# Patient Record
Sex: Male | Born: 1950 | Race: White | Hispanic: No | Marital: Single | Smoking: Former smoker
Health system: Southern US, Community
[De-identification: ages and names within clinical notes are randomized; demographics above are authoritative.]

## PROBLEM LIST (undated history)

## (undated) DIAGNOSIS — G5600 Carpal tunnel syndrome, unspecified upper limb: Secondary | ICD-10-CM

## (undated) DIAGNOSIS — N319 Neuromuscular dysfunction of bladder, unspecified: Secondary | ICD-10-CM

## (undated) DIAGNOSIS — E785 Hyperlipidemia, unspecified: Secondary | ICD-10-CM

## (undated) DIAGNOSIS — E039 Hypothyroidism, unspecified: Secondary | ICD-10-CM

## (undated) DIAGNOSIS — Z8719 Personal history of other diseases of the digestive system: Secondary | ICD-10-CM

## (undated) DIAGNOSIS — K219 Gastro-esophageal reflux disease without esophagitis: Secondary | ICD-10-CM

## (undated) DIAGNOSIS — I739 Peripheral vascular disease, unspecified: Secondary | ICD-10-CM

## (undated) DIAGNOSIS — M419 Scoliosis, unspecified: Secondary | ICD-10-CM

## (undated) DIAGNOSIS — R0600 Dyspnea, unspecified: Secondary | ICD-10-CM

## (undated) DIAGNOSIS — IMO0002 Reserved for concepts with insufficient information to code with codable children: Secondary | ICD-10-CM

## (undated) DIAGNOSIS — Z87448 Personal history of other diseases of urinary system: Secondary | ICD-10-CM

## (undated) DIAGNOSIS — M797 Fibromyalgia: Secondary | ICD-10-CM

## (undated) DIAGNOSIS — M159 Polyosteoarthritis, unspecified: Secondary | ICD-10-CM

## (undated) DIAGNOSIS — M75100 Unspecified rotator cuff tear or rupture of unspecified shoulder, not specified as traumatic: Secondary | ICD-10-CM

## (undated) DIAGNOSIS — Z87898 Personal history of other specified conditions: Secondary | ICD-10-CM

## (undated) DIAGNOSIS — F329 Major depressive disorder, single episode, unspecified: Secondary | ICD-10-CM

## (undated) DIAGNOSIS — I38 Endocarditis, valve unspecified: Secondary | ICD-10-CM

## (undated) DIAGNOSIS — N400 Enlarged prostate without lower urinary tract symptoms: Secondary | ICD-10-CM

## (undated) DIAGNOSIS — I2510 Atherosclerotic heart disease of native coronary artery without angina pectoris: Secondary | ICD-10-CM

## (undated) DIAGNOSIS — I2699 Other pulmonary embolism without acute cor pulmonale: Secondary | ICD-10-CM

## (undated) DIAGNOSIS — I219 Acute myocardial infarction, unspecified: Secondary | ICD-10-CM

## (undated) DIAGNOSIS — F419 Anxiety disorder, unspecified: Secondary | ICD-10-CM

## (undated) DIAGNOSIS — M726 Necrotizing fasciitis: Secondary | ICD-10-CM

## (undated) DIAGNOSIS — G4700 Insomnia, unspecified: Secondary | ICD-10-CM

## (undated) DIAGNOSIS — I10 Essential (primary) hypertension: Secondary | ICD-10-CM

## (undated) DIAGNOSIS — G35 Multiple sclerosis: Secondary | ICD-10-CM

## (undated) DIAGNOSIS — D696 Thrombocytopenia, unspecified: Secondary | ICD-10-CM

## (undated) DIAGNOSIS — R0609 Other forms of dyspnea: Secondary | ICD-10-CM

## (undated) DIAGNOSIS — G4733 Obstructive sleep apnea (adult) (pediatric): Secondary | ICD-10-CM

## (undated) DIAGNOSIS — Z8614 Personal history of Methicillin resistant Staphylococcus aureus infection: Secondary | ICD-10-CM

## (undated) DIAGNOSIS — F909 Attention-deficit hyperactivity disorder, unspecified type: Secondary | ICD-10-CM

## (undated) DIAGNOSIS — Z8739 Personal history of other diseases of the musculoskeletal system and connective tissue: Secondary | ICD-10-CM

## (undated) DIAGNOSIS — N1330 Unspecified hydronephrosis: Secondary | ICD-10-CM

## (undated) DIAGNOSIS — J449 Chronic obstructive pulmonary disease, unspecified: Secondary | ICD-10-CM

## (undated) DIAGNOSIS — Z9289 Personal history of other medical treatment: Secondary | ICD-10-CM

## (undated) DIAGNOSIS — F32A Depression, unspecified: Secondary | ICD-10-CM

## (undated) DIAGNOSIS — R011 Cardiac murmur, unspecified: Secondary | ICD-10-CM

## (undated) HISTORY — DX: Depression, unspecified: F32.A

## (undated) HISTORY — PX: CARPAL TUNNEL RELEASE: SHX101

## (undated) HISTORY — DX: Insomnia, unspecified: G47.00

## (undated) HISTORY — DX: Neuromuscular dysfunction of bladder, unspecified: N31.9

## (undated) HISTORY — DX: Necrotizing fasciitis: M72.6

## (undated) HISTORY — DX: Major depressive disorder, single episode, unspecified: F32.9

## (undated) HISTORY — DX: Essential (primary) hypertension: I10

## (undated) HISTORY — DX: Benign prostatic hyperplasia without lower urinary tract symptoms: N40.0

## (undated) HISTORY — DX: Personal history of other diseases of the musculoskeletal system and connective tissue: Z87.39

## (undated) HISTORY — DX: Personal history of other diseases of urinary system: Z87.448

## (undated) HISTORY — DX: Obstructive sleep apnea (adult) (pediatric): G47.33

## (undated) HISTORY — DX: Reserved for concepts with insufficient information to code with codable children: IMO0002

## (undated) HISTORY — DX: Hyperlipidemia, unspecified: E78.5

## (undated) HISTORY — DX: Thrombocytopenia, unspecified: D69.6

## (undated) HISTORY — DX: Endocarditis, valve unspecified: I38

## (undated) HISTORY — DX: Unspecified rotator cuff tear or rupture of unspecified shoulder, not specified as traumatic: M75.100

## (undated) HISTORY — PX: COLONOSCOPY: SHX174

## (undated) HISTORY — DX: Atherosclerotic heart disease of native coronary artery without angina pectoris: I25.10

## (undated) HISTORY — DX: Personal history of other specified conditions: Z87.898

## (undated) HISTORY — PX: OTHER SURGICAL HISTORY: SHX169

## (undated) HISTORY — DX: Personal history of Methicillin resistant Staphylococcus aureus infection: Z86.14

## (undated) HISTORY — DX: Polyosteoarthritis, unspecified: M15.9

## (undated) HISTORY — DX: Anxiety disorder, unspecified: F41.9

## (undated) HISTORY — DX: Chronic obstructive pulmonary disease, unspecified: J44.9

## (undated) HISTORY — DX: Carpal tunnel syndrome, unspecified upper limb: G56.00

## (undated) HISTORY — DX: Hypothyroidism, unspecified: E03.9

## (undated) HISTORY — DX: Other pulmonary embolism without acute cor pulmonale: I26.99

## (undated) HISTORY — DX: Gastro-esophageal reflux disease without esophagitis: K21.9

---

## 1988-12-10 HISTORY — PX: KNEE ARTHROSCOPY W/ MENISCAL REPAIR: SHX1877

## 2004-12-22 ENCOUNTER — Emergency Department (HOSPITAL_COMMUNITY): Admission: EM | Admit: 2004-12-22 | Discharge: 2004-12-22 | Payer: Self-pay | Admitting: Emergency Medicine

## 2005-05-23 ENCOUNTER — Ambulatory Visit (HOSPITAL_COMMUNITY): Admission: RE | Admit: 2005-05-23 | Discharge: 2005-05-24 | Payer: Self-pay | Admitting: Orthopedic Surgery

## 2005-09-29 ENCOUNTER — Emergency Department (HOSPITAL_COMMUNITY): Admission: EM | Admit: 2005-09-29 | Discharge: 2005-09-29 | Payer: Self-pay | Admitting: Emergency Medicine

## 2005-10-19 ENCOUNTER — Emergency Department (HOSPITAL_COMMUNITY): Admission: EM | Admit: 2005-10-19 | Discharge: 2005-10-19 | Payer: Self-pay | Admitting: Emergency Medicine

## 2005-10-22 ENCOUNTER — Inpatient Hospital Stay (HOSPITAL_COMMUNITY): Admission: EM | Admit: 2005-10-22 | Discharge: 2005-10-26 | Payer: Self-pay | Admitting: Emergency Medicine

## 2005-10-22 ENCOUNTER — Ambulatory Visit: Payer: Self-pay | Admitting: Internal Medicine

## 2005-10-26 ENCOUNTER — Inpatient Hospital Stay (HOSPITAL_COMMUNITY): Admission: RE | Admit: 2005-10-26 | Discharge: 2005-10-31 | Payer: Self-pay | Admitting: Psychiatry

## 2005-10-27 ENCOUNTER — Ambulatory Visit: Payer: Self-pay | Admitting: Psychiatry

## 2006-05-26 ENCOUNTER — Emergency Department (HOSPITAL_COMMUNITY): Admission: EM | Admit: 2006-05-26 | Discharge: 2006-05-26 | Payer: Self-pay | Admitting: Emergency Medicine

## 2006-05-28 ENCOUNTER — Emergency Department (HOSPITAL_COMMUNITY): Admission: EM | Admit: 2006-05-28 | Discharge: 2006-05-28 | Payer: Self-pay | Admitting: Emergency Medicine

## 2006-08-13 ENCOUNTER — Emergency Department (HOSPITAL_COMMUNITY): Admission: EM | Admit: 2006-08-13 | Discharge: 2006-08-13 | Payer: Self-pay | Admitting: Emergency Medicine

## 2006-12-03 ENCOUNTER — Emergency Department (HOSPITAL_COMMUNITY): Admission: EM | Admit: 2006-12-03 | Discharge: 2006-12-03 | Payer: Self-pay | Admitting: Emergency Medicine

## 2007-01-28 ENCOUNTER — Emergency Department (HOSPITAL_COMMUNITY): Admission: EM | Admit: 2007-01-28 | Discharge: 2007-01-28 | Payer: Self-pay | Admitting: Emergency Medicine

## 2007-01-31 ENCOUNTER — Ambulatory Visit (HOSPITAL_COMMUNITY): Admission: RE | Admit: 2007-01-31 | Discharge: 2007-01-31 | Payer: Self-pay | Admitting: Orthopedic Surgery

## 2007-09-03 ENCOUNTER — Emergency Department (HOSPITAL_COMMUNITY): Admission: EM | Admit: 2007-09-03 | Discharge: 2007-09-03 | Payer: Self-pay | Admitting: Emergency Medicine

## 2008-09-02 ENCOUNTER — Emergency Department (HOSPITAL_COMMUNITY): Admission: EM | Admit: 2008-09-02 | Discharge: 2008-09-02 | Payer: Self-pay | Admitting: Emergency Medicine

## 2009-04-11 DIAGNOSIS — Z87448 Personal history of other diseases of urinary system: Secondary | ICD-10-CM

## 2009-04-11 HISTORY — PX: SKIN GRAFT: SHX250

## 2009-04-11 HISTORY — DX: Personal history of other diseases of urinary system: Z87.448

## 2009-05-01 ENCOUNTER — Ambulatory Visit: Payer: Self-pay | Admitting: Infectious Disease

## 2009-05-05 ENCOUNTER — Emergency Department (HOSPITAL_COMMUNITY)
Admission: EM | Admit: 2009-05-05 | Discharge: 2009-05-05 | Payer: Self-pay | Source: Home / Self Care | Admitting: Emergency Medicine

## 2009-05-05 ENCOUNTER — Encounter (INDEPENDENT_AMBULATORY_CARE_PROVIDER_SITE_OTHER): Payer: Self-pay | Admitting: Internal Medicine

## 2009-08-20 ENCOUNTER — Ambulatory Visit: Payer: Self-pay | Admitting: Internal Medicine

## 2009-08-20 ENCOUNTER — Inpatient Hospital Stay (HOSPITAL_COMMUNITY): Admission: EM | Admit: 2009-08-20 | Discharge: 2009-09-11 | Payer: Self-pay | Admitting: Emergency Medicine

## 2009-08-20 ENCOUNTER — Ambulatory Visit: Payer: Self-pay | Admitting: Cardiovascular Disease

## 2009-08-21 ENCOUNTER — Encounter (INDEPENDENT_AMBULATORY_CARE_PROVIDER_SITE_OTHER): Payer: Self-pay | Admitting: Internal Medicine

## 2009-08-28 ENCOUNTER — Encounter (INDEPENDENT_AMBULATORY_CARE_PROVIDER_SITE_OTHER): Payer: Self-pay | Admitting: Nephrology

## 2009-10-07 ENCOUNTER — Ambulatory Visit: Payer: Self-pay | Admitting: Vascular Surgery

## 2009-12-03 ENCOUNTER — Emergency Department (HOSPITAL_COMMUNITY): Admission: EM | Admit: 2009-12-03 | Discharge: 2009-12-03 | Payer: Self-pay | Admitting: Emergency Medicine

## 2009-12-15 ENCOUNTER — Encounter: Admission: RE | Admit: 2009-12-15 | Discharge: 2010-01-06 | Payer: Self-pay | Admitting: Nephrology

## 2010-04-11 DIAGNOSIS — I38 Endocarditis, valve unspecified: Secondary | ICD-10-CM

## 2010-04-11 DIAGNOSIS — Z8614 Personal history of Methicillin resistant Staphylococcus aureus infection: Secondary | ICD-10-CM

## 2010-04-11 DIAGNOSIS — M75100 Unspecified rotator cuff tear or rupture of unspecified shoulder, not specified as traumatic: Secondary | ICD-10-CM

## 2010-04-11 HISTORY — DX: Personal history of Methicillin resistant Staphylococcus aureus infection: Z86.14

## 2010-04-11 HISTORY — DX: Endocarditis, valve unspecified: I38

## 2010-04-11 HISTORY — DX: Unspecified rotator cuff tear or rupture of unspecified shoulder, not specified as traumatic: M75.100

## 2010-05-02 ENCOUNTER — Encounter: Payer: Self-pay | Admitting: Orthopedic Surgery

## 2010-05-02 ENCOUNTER — Encounter (HOSPITAL_BASED_OUTPATIENT_CLINIC_OR_DEPARTMENT_OTHER): Payer: Self-pay | Admitting: Internal Medicine

## 2010-06-25 LAB — POCT I-STAT, CHEM 8
BUN: 29 mg/dL — ABNORMAL HIGH (ref 6–23)
Chloride: 103 mEq/L (ref 96–112)
HCT: 42 % (ref 39.0–52.0)
Potassium: 3.1 mEq/L — ABNORMAL LOW (ref 3.5–5.1)
TCO2: 24 mmol/L (ref 0–100)

## 2010-06-25 LAB — PROTIME-INR: INR: 1.03 (ref 0.00–1.49)

## 2010-06-25 LAB — CBC
MCH: 32.8 pg (ref 26.0–34.0)
MCHC: 33 g/dL (ref 30.0–36.0)
MCV: 99.4 fL (ref 78.0–100.0)
Platelets: 454 10*3/uL — ABNORMAL HIGH (ref 150–400)
RDW: 17.6 % — ABNORMAL HIGH (ref 11.5–15.5)
WBC: 10.4 10*3/uL (ref 4.0–10.5)

## 2010-06-25 LAB — DIFFERENTIAL
Basophils Relative: 0 % (ref 0–1)
Lymphocytes Relative: 15 % (ref 12–46)
Lymphs Abs: 1.5 10*3/uL (ref 0.7–4.0)
Monocytes Relative: 6 % (ref 3–12)

## 2010-06-27 LAB — POCT I-STAT 3, ART BLOOD GAS (G3+)
Acid-Base Excess: 5 mmol/L — ABNORMAL HIGH (ref 0.0–2.0)
Bicarbonate: 28.8 mEq/L — ABNORMAL HIGH (ref 20.0–24.0)
Patient temperature: 98.3
pH, Arterial: 7.493 — ABNORMAL HIGH (ref 7.350–7.450)

## 2010-06-27 LAB — SODIUM, URINE, RANDOM: Sodium, Ur: <10 mEq/L

## 2010-06-27 LAB — URINALYSIS, ROUTINE W REFLEX MICROSCOPIC
Nitrite: NEGATIVE
Specific Gravity, Urine: >1.046 — ABNORMAL HIGH (ref 1.005–1.030)
Urobilinogen, UA: 4 mg/dL — ABNORMAL HIGH (ref 0.0–1.0)
pH: 6.5 (ref 5.0–8.0)

## 2010-06-27 LAB — CULTURE, BLOOD (ROUTINE X 2)

## 2010-06-27 LAB — HEPATITIS PANEL, ACUTE
HCV Ab: NEGATIVE
Hep B C IgM: POSITIVE — AB
Hepatitis B Surface Ag: NEGATIVE

## 2010-06-27 LAB — AMYLASE: Amylase: 18 U/L (ref 0–105)

## 2010-06-27 LAB — DIFFERENTIAL
Eosinophils Absolute: 0 10*3/uL (ref 0.0–0.7)
Lymphs Abs: 1 10*3/uL (ref 0.7–4.0)
Monocytes Absolute: 0.2 10*3/uL (ref 0.1–1.0)
Monocytes Relative: 1 % — ABNORMAL LOW (ref 3–12)
Neutro Abs: 23.4 10*3/uL — ABNORMAL HIGH (ref 1.7–7.7)

## 2010-06-27 LAB — COMPREHENSIVE METABOLIC PANEL
AST: 86 U/L — ABNORMAL HIGH (ref 0–37)
BUN: 19 mg/dL (ref 6–23)
Calcium: 8 mg/dL — ABNORMAL LOW (ref 8.4–10.5)
Chloride: 86 mEq/L — ABNORMAL LOW (ref 96–112)
Glucose, Bld: 284 mg/dL — ABNORMAL HIGH (ref 70–99)
Total Protein: 5.7 g/dL — ABNORMAL LOW (ref 6.0–8.3)

## 2010-06-27 LAB — GRAM STAIN

## 2010-06-27 LAB — URINE CULTURE

## 2010-06-27 LAB — CULTURE, ROUTINE-ABSCESS

## 2010-06-27 LAB — CBC
RBC: 3.73 MIL/uL — ABNORMAL LOW (ref 4.22–5.81)
RDW: 14.4 % (ref 11.5–15.5)

## 2010-06-27 LAB — LACTIC ACID, PLASMA: Lactic Acid, Venous: 4.5 mmol/L — ABNORMAL HIGH (ref 0.5–2.2)

## 2010-06-27 LAB — POCT CARDIAC MARKERS: Troponin i, poc: <0.05 ng/mL (ref 0.00–0.09)

## 2010-06-27 LAB — AFB CULTURE WITH SMEAR (NOT AT ARMC)

## 2010-06-27 LAB — D-DIMER, QUANTITATIVE: D-Dimer, Quant: 10 ug/mL-FEU — ABNORMAL HIGH (ref 0.00–0.48)

## 2010-06-27 LAB — FUNGUS CULTURE W SMEAR: Fungal Smear: NONE SEEN

## 2010-06-27 LAB — LIPASE, BLOOD: Lipase: 17 U/L (ref 11–59)

## 2010-06-27 LAB — URINE MICROSCOPIC-ADD ON

## 2010-06-27 LAB — RAPID URINE DRUG SCREEN, HOSP PERFORMED: Tetrahydrocannabinol: POSITIVE — AB

## 2010-06-28 LAB — RENAL FUNCTION PANEL
Albumin: 2.2 g/dL — ABNORMAL LOW (ref 3.5–5.2)
Albumin: 2.2 g/dL — ABNORMAL LOW (ref 3.5–5.2)
Albumin: 2.1 g/dL — ABNORMAL LOW (ref 3.5–5.2)
Albumin: 2.2 g/dL — ABNORMAL LOW (ref 3.5–5.2)
Albumin: 2.2 g/dL — ABNORMAL LOW (ref 3.5–5.2)
Albumin: 2.5 g/dL — ABNORMAL LOW (ref 3.5–5.2)
Albumin: 2.5 g/dL — ABNORMAL LOW (ref 3.5–5.2)
Albumin: 2.6 g/dL — ABNORMAL LOW (ref 3.5–5.2)
Albumin: 2.6 g/dL — ABNORMAL LOW (ref 3.5–5.2)
BUN: 63 mg/dL — ABNORMAL HIGH (ref 6–23)
BUN: 104 mg/dL — ABNORMAL HIGH (ref 6–23)
BUN: 106 mg/dL — ABNORMAL HIGH (ref 6–23)
BUN: 107 mg/dL — ABNORMAL HIGH (ref 6–23)
BUN: 63 mg/dL — ABNORMAL HIGH (ref 6–23)
BUN: 67 mg/dL — ABNORMAL HIGH (ref 6–23)
BUN: 74 mg/dL — ABNORMAL HIGH (ref 6–23)
BUN: 90 mg/dL — ABNORMAL HIGH (ref 6–23)
CO2: 25 mEq/L (ref 19–32)
CO2: 19 mEq/L (ref 19–32)
CO2: 21 mEq/L (ref 19–32)
CO2: 22 mEq/L (ref 19–32)
CO2: 22 mEq/L (ref 19–32)
CO2: 23 mEq/L (ref 19–32)
CO2: 23 mEq/L (ref 19–32)
CO2: 23 mEq/L (ref 19–32)
CO2: 23 mEq/L (ref 19–32)
CO2: 24 mEq/L (ref 19–32)
CO2: 25 mEq/L (ref 19–32)
Calcium: 8.6 mg/dL (ref 8.4–10.5)
Calcium: 8.7 mg/dL (ref 8.4–10.5)
Calcium: 8.6 mg/dL (ref 8.4–10.5)
Calcium: 8.6 mg/dL (ref 8.4–10.5)
Calcium: 8.7 mg/dL (ref 8.4–10.5)
Calcium: 8.7 mg/dL (ref 8.4–10.5)
Calcium: 8.8 mg/dL (ref 8.4–10.5)
Calcium: 8.8 mg/dL (ref 8.4–10.5)
Calcium: 8.8 mg/dL (ref 8.4–10.5)
Calcium: 8.8 mg/dL (ref 8.4–10.5)
Chloride: 96 mEq/L (ref 96–112)
Chloride: 100 mEq/L (ref 96–112)
Chloride: 105 mEq/L (ref 96–112)
Chloride: 106 mEq/L (ref 96–112)
Chloride: 107 mEq/L (ref 96–112)
Chloride: 97 mEq/L (ref 96–112)
Chloride: 98 mEq/L (ref 96–112)
Chloride: 98 mEq/L (ref 96–112)
Creatinine, Ser: 4.69 mg/dL — ABNORMAL HIGH (ref 0.4–1.5)
Creatinine, Ser: 5.46 mg/dL — ABNORMAL HIGH (ref 0.4–1.5)
Creatinine, Ser: 6.51 mg/dL — ABNORMAL HIGH (ref 0.4–1.5)
Creatinine, Ser: 7.17 mg/dL — ABNORMAL HIGH (ref 0.4–1.5)
GFR calc Af Amer: 12 mL/min — ABNORMAL LOW (ref >60)
GFR calc Af Amer: 10 mL/min — ABNORMAL LOW (ref >60)
GFR calc Af Amer: 10 mL/min — ABNORMAL LOW (ref >60)
GFR calc Af Amer: 11 mL/min — ABNORMAL LOW (ref >60)
GFR calc Af Amer: 12 mL/min — ABNORMAL LOW (ref >60)
GFR calc Af Amer: 13 mL/min — ABNORMAL LOW (ref >60)
GFR calc Af Amer: 13 mL/min — ABNORMAL LOW (ref >60)
GFR calc Af Amer: 13 mL/min — ABNORMAL LOW (ref >60)
GFR calc Af Amer: 15 mL/min — ABNORMAL LOW (ref >60)
GFR calc Af Amer: 16 mL/min — ABNORMAL LOW (ref >60)
GFR calc Af Amer: 20 mL/min — ABNORMAL LOW (ref >60)
GFR calc non Af Amer: 10 mL/min — ABNORMAL LOW (ref >60)
GFR calc non Af Amer: 10 mL/min — ABNORMAL LOW (ref >60)
GFR calc non Af Amer: 10 mL/min — ABNORMAL LOW (ref >60)
GFR calc non Af Amer: 11 mL/min — ABNORMAL LOW (ref >60)
GFR calc non Af Amer: 11 mL/min — ABNORMAL LOW (ref >60)
GFR calc non Af Amer: 12 mL/min — ABNORMAL LOW (ref >60)
GFR calc non Af Amer: 13 mL/min — ABNORMAL LOW (ref >60)
GFR calc non Af Amer: 16 mL/min — ABNORMAL LOW (ref >60)
GFR calc non Af Amer: 8 mL/min — ABNORMAL LOW (ref >60)
GFR calc non Af Amer: 8 mL/min — ABNORMAL LOW (ref >60)
GFR calc non Af Amer: 9 mL/min — ABNORMAL LOW (ref >60)
Glucose, Bld: 94 mg/dL (ref 70–99)
Glucose, Bld: 112 mg/dL — ABNORMAL HIGH (ref 70–99)
Glucose, Bld: 115 mg/dL — ABNORMAL HIGH (ref 70–99)
Glucose, Bld: 148 mg/dL — ABNORMAL HIGH (ref 70–99)
Glucose, Bld: 150 mg/dL — ABNORMAL HIGH (ref 70–99)
Glucose, Bld: 152 mg/dL — ABNORMAL HIGH (ref 70–99)
Glucose, Bld: 90 mg/dL (ref 70–99)
Glucose, Bld: 94 mg/dL (ref 70–99)
Glucose, Bld: 99 mg/dL (ref 70–99)
Glucose, Bld: 99 mg/dL (ref 70–99)
Phosphorus: 3.5 mg/dL (ref 2.3–4.6)
Phosphorus: 4.8 mg/dL — ABNORMAL HIGH (ref 2.3–4.6)
Phosphorus: 4 mg/dL (ref 2.3–4.6)
Phosphorus: 5.7 mg/dL — ABNORMAL HIGH (ref 2.3–4.6)
Phosphorus: 6 mg/dL — ABNORMAL HIGH (ref 2.3–4.6)
Phosphorus: 6.2 mg/dL — ABNORMAL HIGH (ref 2.3–4.6)
Phosphorus: 6.4 mg/dL — ABNORMAL HIGH (ref 2.3–4.6)
Phosphorus: 6.8 mg/dL — ABNORMAL HIGH (ref 2.3–4.6)
Phosphorus: 7.3 mg/dL — ABNORMAL HIGH (ref 2.3–4.6)
Phosphorus: 8.5 mg/dL — ABNORMAL HIGH (ref 2.3–4.6)
Potassium: 4.2 mEq/L (ref 3.5–5.1)
Potassium: 3.8 mEq/L (ref 3.5–5.1)
Potassium: 3.9 mEq/L (ref 3.5–5.1)
Potassium: 4.2 mEq/L (ref 3.5–5.1)
Potassium: 4.4 mEq/L (ref 3.5–5.1)
Potassium: 4.4 mEq/L (ref 3.5–5.1)
Potassium: 4.4 mEq/L (ref 3.5–5.1)
Potassium: 4.5 mEq/L (ref 3.5–5.1)
Potassium: 4.5 mEq/L (ref 3.5–5.1)
Potassium: 4.9 mEq/L (ref 3.5–5.1)
Potassium: 5.3 mEq/L — ABNORMAL HIGH (ref 3.5–5.1)
Sodium: 134 mEq/L — ABNORMAL LOW (ref 135–145)
Sodium: 135 mEq/L (ref 135–145)
Sodium: 135 mEq/L (ref 135–145)
Sodium: 133 mEq/L — ABNORMAL LOW (ref 135–145)
Sodium: 135 mEq/L (ref 135–145)
Sodium: 136 mEq/L (ref 135–145)
Sodium: 136 mEq/L (ref 135–145)
Sodium: 136 mEq/L (ref 135–145)
Sodium: 137 mEq/L (ref 135–145)
Sodium: 137 mEq/L (ref 135–145)
Sodium: 138 mEq/L (ref 135–145)
Sodium: 140 mEq/L (ref 135–145)
Sodium: 140 mEq/L (ref 135–145)
Sodium: 141 mEq/L (ref 135–145)
Sodium: 141 mEq/L (ref 135–145)
Sodium: 142 mEq/L (ref 135–145)

## 2010-06-28 LAB — DIFFERENTIAL
Basophils Absolute: 0 10*3/uL (ref 0.0–0.1)
Basophils Absolute: 0 10*3/uL (ref 0.0–0.1)
Basophils Absolute: 0.1 10*3/uL (ref 0.0–0.1)
Basophils Absolute: 0.1 10*3/uL (ref 0.0–0.1)
Basophils Absolute: 0.1 10*3/uL (ref 0.0–0.1)
Basophils Relative: 0 % (ref 0–1)
Basophils Relative: 0 % (ref 0–1)
Basophils Relative: 0 % (ref 0–1)
Basophils Relative: 0 % (ref 0–1)
Basophils Relative: 0 % (ref 0–1)
Basophils Relative: 0 % (ref 0–1)
Basophils Relative: 1 % (ref 0–1)
Eosinophils Absolute: 0 10*3/uL (ref 0.0–0.7)
Eosinophils Absolute: 0 10*3/uL (ref 0.0–0.7)
Eosinophils Absolute: 0.1 10*3/uL (ref 0.0–0.7)
Eosinophils Absolute: 0.1 10*3/uL (ref 0.0–0.7)
Eosinophils Absolute: 0.1 10*3/uL (ref 0.0–0.7)
Eosinophils Absolute: 0.1 10*3/uL (ref 0.0–0.7)
Eosinophils Absolute: 0.1 10*3/uL (ref 0.0–0.7)
Eosinophils Absolute: 0.3 10*3/uL (ref 0.0–0.7)
Eosinophils Relative: 0 % (ref 0–5)
Eosinophils Relative: 0 % (ref 0–5)
Eosinophils Relative: 1 % (ref 0–5)
Eosinophils Relative: 1 % (ref 0–5)
Eosinophils Relative: 1 % (ref 0–5)
Eosinophils Relative: 1 % (ref 0–5)
Lymphocytes Relative: 20 % (ref 12–46)
Lymphocytes Relative: 10 % — ABNORMAL LOW (ref 12–46)
Lymphocytes Relative: 14 % (ref 12–46)
Lymphocytes Relative: 16 % (ref 12–46)
Lymphocytes Relative: 16 % (ref 12–46)
Lymphocytes Relative: 17 % (ref 12–46)
Lymphocytes Relative: 18 % (ref 12–46)
Lymphocytes Relative: 22 % (ref 12–46)
Lymphocytes Relative: 26 % (ref 12–46)
Lymphs Abs: 3 10*3/uL (ref 0.7–4.0)
Lymphs Abs: 1.4 10*3/uL (ref 0.7–4.0)
Lymphs Abs: 1.9 10*3/uL (ref 0.7–4.0)
Lymphs Abs: 2.1 10*3/uL (ref 0.7–4.0)
Lymphs Abs: 2.6 10*3/uL (ref 0.7–4.0)
Lymphs Abs: 2.7 10*3/uL (ref 0.7–4.0)
Lymphs Abs: 2.7 10*3/uL (ref 0.7–4.0)
Lymphs Abs: 2.8 10*3/uL (ref 0.7–4.0)
Lymphs Abs: 3 10*3/uL (ref 0.7–4.0)
Lymphs Abs: 3.1 10*3/uL (ref 0.7–4.0)
Monocytes Absolute: 0.6 10*3/uL (ref 0.1–1.0)
Monocytes Absolute: 1.1 10*3/uL — ABNORMAL HIGH (ref 0.1–1.0)
Monocytes Absolute: 1.2 10*3/uL — ABNORMAL HIGH (ref 0.1–1.0)
Monocytes Absolute: 1.5 10*3/uL — ABNORMAL HIGH (ref 0.1–1.0)
Monocytes Absolute: 1.5 10*3/uL — ABNORMAL HIGH (ref 0.1–1.0)
Monocytes Absolute: 1.6 10*3/uL — ABNORMAL HIGH (ref 0.1–1.0)
Monocytes Absolute: 1.7 10*3/uL — ABNORMAL HIGH (ref 0.1–1.0)
Monocytes Absolute: 1.7 10*3/uL — ABNORMAL HIGH (ref 0.1–1.0)
Monocytes Relative: 10 % (ref 3–12)
Monocytes Relative: 10 % (ref 3–12)
Monocytes Relative: 10 % (ref 3–12)
Monocytes Relative: 11 % (ref 3–12)
Monocytes Relative: 11 % (ref 3–12)
Monocytes Relative: 13 % — ABNORMAL HIGH (ref 3–12)
Monocytes Relative: 7 % (ref 3–12)
Monocytes Relative: 7 % (ref 3–12)
Monocytes Relative: 7 % (ref 3–12)
Neutro Abs: 10.4 10*3/uL — ABNORMAL HIGH (ref 1.7–7.7)
Neutro Abs: 11.3 10*3/uL — ABNORMAL HIGH (ref 1.7–7.7)
Neutro Abs: 12.9 10*3/uL — ABNORMAL HIGH (ref 1.7–7.7)
Neutro Abs: 14 10*3/uL — ABNORMAL HIGH (ref 1.7–7.7)
Neutro Abs: 14.1 10*3/uL — ABNORMAL HIGH (ref 1.7–7.7)
Neutro Abs: 14.6 10*3/uL — ABNORMAL HIGH (ref 1.7–7.7)
Neutro Abs: 6.5 10*3/uL (ref 1.7–7.7)
Neutrophils Relative %: 67 % (ref 43–77)
Neutrophils Relative %: 62 % (ref 43–77)
Neutrophils Relative %: 65 % (ref 43–77)
Neutrophils Relative %: 66 % (ref 43–77)
Neutrophils Relative %: 76 % (ref 43–77)
Neutrophils Relative %: 77 % (ref 43–77)
Neutrophils Relative %: 80 % — ABNORMAL HIGH (ref 43–77)
Neutrophils Relative %: 81 % — ABNORMAL HIGH (ref 43–77)

## 2010-06-28 LAB — CBC
HCT: 25.8 % — ABNORMAL LOW (ref 39.0–52.0)
HCT: 22.1 % — ABNORMAL LOW (ref 39.0–52.0)
HCT: 24 % — ABNORMAL LOW (ref 39.0–52.0)
HCT: 24.1 % — ABNORMAL LOW (ref 39.0–52.0)
HCT: 24.3 % — ABNORMAL LOW (ref 39.0–52.0)
HCT: 24.9 % — ABNORMAL LOW (ref 39.0–52.0)
HCT: 25.4 % — ABNORMAL LOW (ref 39.0–52.0)
HCT: 26.2 % — ABNORMAL LOW (ref 39.0–52.0)
HCT: 28.7 % — ABNORMAL LOW (ref 39.0–52.0)
Hemoglobin: 7.6 g/dL — ABNORMAL LOW (ref 13.0–17.0)
Hemoglobin: 8.6 g/dL — ABNORMAL LOW (ref 13.0–17.0)
Hemoglobin: 6.7 g/dL — CL (ref 13.0–17.0)
Hemoglobin: 7.5 g/dL — ABNORMAL LOW (ref 13.0–17.0)
Hemoglobin: 7.9 g/dL — ABNORMAL LOW (ref 13.0–17.0)
Hemoglobin: 8 g/dL — ABNORMAL LOW (ref 13.0–17.0)
Hemoglobin: 8.2 g/dL — ABNORMAL LOW (ref 13.0–17.0)
Hemoglobin: 8.3 g/dL — ABNORMAL LOW (ref 13.0–17.0)
Hemoglobin: 8.4 g/dL — ABNORMAL LOW (ref 13.0–17.0)
Hemoglobin: 8.6 g/dL — ABNORMAL LOW (ref 13.0–17.0)
Hemoglobin: 8.6 g/dL — ABNORMAL LOW (ref 13.0–17.0)
Hemoglobin: 8.7 g/dL — ABNORMAL LOW (ref 13.0–17.0)
Hemoglobin: 9 g/dL — ABNORMAL LOW (ref 13.0–17.0)
Hemoglobin: 9.6 g/dL — ABNORMAL LOW (ref 13.0–17.0)
MCHC: 33.5 g/dL (ref 30.0–36.0)
MCHC: 34.4 g/dL (ref 30.0–36.0)
MCHC: 33.7 g/dL (ref 30.0–36.0)
MCHC: 33.8 g/dL (ref 30.0–36.0)
MCHC: 33.9 g/dL (ref 30.0–36.0)
MCHC: 34 g/dL (ref 30.0–36.0)
MCHC: 34.1 g/dL (ref 30.0–36.0)
MCHC: 34.1 g/dL (ref 30.0–36.0)
MCHC: 34.3 g/dL (ref 30.0–36.0)
MCHC: 34.4 g/dL (ref 30.0–36.0)
MCV: 90.2 fL (ref 78.0–100.0)
MCV: 90.6 fL (ref 78.0–100.0)
MCV: 90.9 fL (ref 78.0–100.0)
MCV: 90.9 fL (ref 78.0–100.0)
MCV: 91.4 fL (ref 78.0–100.0)
MCV: 91.4 fL (ref 78.0–100.0)
MCV: 91.5 fL (ref 78.0–100.0)
MCV: 91.7 fL (ref 78.0–100.0)
MCV: 91.7 fL (ref 78.0–100.0)
MCV: 91.9 fL (ref 78.0–100.0)
MCV: 92 fL (ref 78.0–100.0)
Platelets: 227 10*3/uL (ref 150–400)
Platelets: 268 10*3/uL (ref 150–400)
Platelets: 314 10*3/uL (ref 150–400)
Platelets: 324 10*3/uL (ref 150–400)
Platelets: 329 10*3/uL (ref 150–400)
Platelets: 364 10*3/uL (ref 150–400)
Platelets: 402 10*3/uL — ABNORMAL HIGH (ref 150–400)
RBC: 2.55 MIL/uL — ABNORMAL LOW (ref 4.22–5.81)
RBC: 2.78 MIL/uL — ABNORMAL LOW (ref 4.22–5.81)
RBC: 2.55 MIL/uL — ABNORMAL LOW (ref 4.22–5.81)
RBC: 2.55 MIL/uL — ABNORMAL LOW (ref 4.22–5.81)
RBC: 2.62 MIL/uL — ABNORMAL LOW (ref 4.22–5.81)
RBC: 2.63 MIL/uL — ABNORMAL LOW (ref 4.22–5.81)
RBC: 2.65 MIL/uL — ABNORMAL LOW (ref 4.22–5.81)
RBC: 2.72 MIL/uL — ABNORMAL LOW (ref 4.22–5.81)
RBC: 2.75 MIL/uL — ABNORMAL LOW (ref 4.22–5.81)
RBC: 2.78 MIL/uL — ABNORMAL LOW (ref 4.22–5.81)
RBC: 2.86 MIL/uL — ABNORMAL LOW (ref 4.22–5.81)
RBC: 3.09 MIL/uL — ABNORMAL LOW (ref 4.22–5.81)
RDW: 16.3 % — ABNORMAL HIGH (ref 11.5–15.5)
RDW: 16 % — ABNORMAL HIGH (ref 11.5–15.5)
RDW: 16.1 % — ABNORMAL HIGH (ref 11.5–15.5)
RDW: 16.2 % — ABNORMAL HIGH (ref 11.5–15.5)
RDW: 16.2 % — ABNORMAL HIGH (ref 11.5–15.5)
RDW: 16.4 % — ABNORMAL HIGH (ref 11.5–15.5)
RDW: 16.5 % — ABNORMAL HIGH (ref 11.5–15.5)
RDW: 16.5 % — ABNORMAL HIGH (ref 11.5–15.5)
RDW: 16.7 % — ABNORMAL HIGH (ref 11.5–15.5)
RDW: 16.8 % — ABNORMAL HIGH (ref 11.5–15.5)
WBC: 15.2 10*3/uL — ABNORMAL HIGH (ref 4.0–10.5)
WBC: 15.5 10*3/uL — ABNORMAL HIGH (ref 4.0–10.5)
WBC: 10.6 10*3/uL — ABNORMAL HIGH (ref 4.0–10.5)
WBC: 13.5 10*3/uL — ABNORMAL HIGH (ref 4.0–10.5)
WBC: 13.9 10*3/uL — ABNORMAL HIGH (ref 4.0–10.5)
WBC: 14.4 10*3/uL — ABNORMAL HIGH (ref 4.0–10.5)
WBC: 14.5 10*3/uL — ABNORMAL HIGH (ref 4.0–10.5)
WBC: 15.9 10*3/uL — ABNORMAL HIGH (ref 4.0–10.5)
WBC: 16 10*3/uL — ABNORMAL HIGH (ref 4.0–10.5)
WBC: 16.6 10*3/uL — ABNORMAL HIGH (ref 4.0–10.5)
WBC: 17.1 10*3/uL — ABNORMAL HIGH (ref 4.0–10.5)
WBC: 18.1 10*3/uL — ABNORMAL HIGH (ref 4.0–10.5)

## 2010-06-28 LAB — CULTURE, BLOOD (ROUTINE X 2)
Culture: NO GROWTH
Culture: NO GROWTH
Culture: NO GROWTH

## 2010-06-28 LAB — BRAIN NATRIURETIC PEPTIDE: Pro B Natriuretic peptide (BNP): 2915 pg/mL — ABNORMAL HIGH (ref 0.0–100.0)

## 2010-06-28 LAB — MAGNESIUM
Magnesium: 1.9 mg/dL (ref 1.5–2.5)
Magnesium: 2 mg/dL (ref 1.5–2.5)
Magnesium: 2 mg/dL (ref 1.5–2.5)
Magnesium: 2 mg/dL (ref 1.5–2.5)

## 2010-06-28 LAB — BASIC METABOLIC PANEL
BUN: 86 mg/dL — ABNORMAL HIGH (ref 6–23)
CO2: 19 mEq/L (ref 19–32)
CO2: 22 mEq/L (ref 19–32)
Calcium: 8.7 mg/dL (ref 8.4–10.5)
Chloride: 103 mEq/L (ref 96–112)
GFR calc non Af Amer: 10 mL/min — ABNORMAL LOW (ref >60)
GFR calc non Af Amer: 11 mL/min — ABNORMAL LOW (ref >60)
Glucose, Bld: 118 mg/dL — ABNORMAL HIGH (ref 70–99)
Glucose, Bld: 118 mg/dL — ABNORMAL HIGH (ref 70–99)
Glucose, Bld: 88 mg/dL (ref 70–99)
Potassium: 4.4 mEq/L (ref 3.5–5.1)
Potassium: 4.9 mEq/L (ref 3.5–5.1)
Potassium: 5.2 mEq/L — ABNORMAL HIGH (ref 3.5–5.1)
Sodium: 137 mEq/L (ref 135–145)
Sodium: 138 mEq/L (ref 135–145)

## 2010-06-28 LAB — URINALYSIS, ROUTINE W REFLEX MICROSCOPIC
Bilirubin Urine: NEGATIVE
Glucose, UA: NEGATIVE mg/dL
Glucose, UA: NEGATIVE mg/dL
Ketones, ur: NEGATIVE mg/dL
Protein, ur: 100 mg/dL — AB
Protein, ur: 100 mg/dL — AB
Specific Gravity, Urine: 1.016 (ref 1.005–1.030)
Urobilinogen, UA: 0.2 mg/dL (ref 0.0–1.0)

## 2010-06-28 LAB — URINE MICROSCOPIC-ADD ON

## 2010-06-28 LAB — ANA
Anti Nuclear Antibody(ANA): NEGATIVE
Anti Nuclear Antibody(ANA): NEGATIVE

## 2010-06-28 LAB — GLUCOSE, CAPILLARY
Glucose-Capillary: 105 mg/dL — ABNORMAL HIGH (ref 70–99)
Glucose-Capillary: 110 mg/dL — ABNORMAL HIGH (ref 70–99)
Glucose-Capillary: 117 mg/dL — ABNORMAL HIGH (ref 70–99)
Glucose-Capillary: 125 mg/dL — ABNORMAL HIGH (ref 70–99)
Glucose-Capillary: 127 mg/dL — ABNORMAL HIGH (ref 70–99)
Glucose-Capillary: 128 mg/dL — ABNORMAL HIGH (ref 70–99)
Glucose-Capillary: 130 mg/dL — ABNORMAL HIGH (ref 70–99)
Glucose-Capillary: 132 mg/dL — ABNORMAL HIGH (ref 70–99)
Glucose-Capillary: 133 mg/dL — ABNORMAL HIGH (ref 70–99)
Glucose-Capillary: 142 mg/dL — ABNORMAL HIGH (ref 70–99)
Glucose-Capillary: 167 mg/dL — ABNORMAL HIGH (ref 70–99)
Glucose-Capillary: 167 mg/dL — ABNORMAL HIGH (ref 70–99)
Glucose-Capillary: 170 mg/dL — ABNORMAL HIGH (ref 70–99)
Glucose-Capillary: 175 mg/dL — ABNORMAL HIGH (ref 70–99)
Glucose-Capillary: 185 mg/dL — ABNORMAL HIGH (ref 70–99)
Glucose-Capillary: 195 mg/dL — ABNORMAL HIGH (ref 70–99)
Glucose-Capillary: 216 mg/dL — ABNORMAL HIGH (ref 70–99)
Glucose-Capillary: 216 mg/dL — ABNORMAL HIGH (ref 70–99)

## 2010-06-28 LAB — PROTIME-INR
INR: 1.24 (ref 0.00–1.49)
INR: 1.03 (ref 0.00–1.49)
INR: 1.04 (ref 0.00–1.49)
INR: 1.04 (ref 0.00–1.49)
INR: 1.08 (ref 0.00–1.49)
INR: 1.09 (ref 0.00–1.49)
INR: 1.09 (ref 0.00–1.49)
INR: 1.11 (ref 0.00–1.49)
INR: 1.18 (ref 0.00–1.49)
INR: 1.19 (ref 0.00–1.49)
INR: 1.27 (ref 0.00–1.49)
INR: 1.48 (ref 0.00–1.49)
Prothrombin Time: 14.9 seconds (ref 11.6–15.2)
Prothrombin Time: 15.5 seconds — ABNORMAL HIGH (ref 11.6–15.2)
Prothrombin Time: 16 seconds — ABNORMAL HIGH (ref 11.6–15.2)
Prothrombin Time: 13.5 seconds (ref 11.6–15.2)
Prothrombin Time: 13.5 seconds (ref 11.6–15.2)
Prothrombin Time: 13.9 seconds (ref 11.6–15.2)
Prothrombin Time: 14 seconds (ref 11.6–15.2)
Prothrombin Time: 14.9 seconds (ref 11.6–15.2)
Prothrombin Time: 15 seconds (ref 11.6–15.2)
Prothrombin Time: 15.8 seconds — ABNORMAL HIGH (ref 11.6–15.2)
Prothrombin Time: 17.8 seconds — ABNORMAL HIGH (ref 11.6–15.2)

## 2010-06-28 LAB — CARDIAC PANEL(CRET KIN+CKTOT+MB+TROPI)
CK, MB: 3.1 ng/mL (ref 0.3–4.0)
Relative Index: INVALID (ref 0.0–2.5)
Total CK: 25 U/L (ref 7–232)
Troponin I: 0.07 ng/mL — ABNORMAL HIGH (ref 0.00–0.06)

## 2010-06-28 LAB — CROSSMATCH
ABO/RH(D): O POS
Antibody Screen: NEGATIVE
Antibody Screen: NEGATIVE

## 2010-06-28 LAB — URINE CULTURE
Colony Count: NO GROWTH
Special Requests: NEGATIVE

## 2010-06-28 LAB — ANTI-NEUTROPHIL ANTIBODY

## 2010-06-28 LAB — HEMOCCULT GUIAC POC 1CARD (OFFICE): Fecal Occult Bld: NEGATIVE

## 2010-06-28 LAB — TSH: TSH: 2.985 u[IU]/mL (ref 0.350–4.500)

## 2010-06-28 LAB — FERRITIN: Ferritin: 779 ng/mL — ABNORMAL HIGH (ref 22–322)

## 2010-06-28 LAB — FOLATE: Folate: 16.7 ng/mL

## 2010-06-28 LAB — RETICULOCYTES: Retic Ct Pct: 1.3 % (ref 0.4–3.1)

## 2010-06-28 LAB — SEDIMENTATION RATE: Sed Rate: 125 mm/hr — ABNORMAL HIGH (ref 0–16)

## 2010-06-28 LAB — STREP A DNA PROBE

## 2010-06-28 LAB — IRON AND TIBC: Iron: 47 ug/dL (ref 42–135)

## 2010-06-28 LAB — HEMOGLOBIN A1C: Mean Plasma Glucose: 114 mg/dL (ref <117)

## 2010-06-29 LAB — GLUCOSE, CAPILLARY
Glucose-Capillary: 104 mg/dL — ABNORMAL HIGH (ref 70–99)
Glucose-Capillary: 124 mg/dL — ABNORMAL HIGH (ref 70–99)
Glucose-Capillary: 148 mg/dL — ABNORMAL HIGH (ref 70–99)
Glucose-Capillary: 95 mg/dL (ref 70–99)

## 2010-06-29 LAB — URINE CULTURE: Colony Count: NO GROWTH

## 2010-06-29 LAB — URINALYSIS, ROUTINE W REFLEX MICROSCOPIC
Ketones, ur: 15 mg/dL — AB
Nitrite: NEGATIVE
Protein, ur: 100 mg/dL — AB
Protein, ur: 100 mg/dL — AB
pH: 5.5 (ref 5.0–8.0)

## 2010-06-29 LAB — LIPID PANEL
HDL: 60 mg/dL (ref >39)
Total CHOL/HDL Ratio: 2.1 RATIO
Triglycerides: 37 mg/dL (ref <150)
VLDL: 7 mg/dL (ref 0–40)

## 2010-06-29 LAB — APTT: aPTT: 47 seconds — ABNORMAL HIGH (ref 24–37)

## 2010-06-29 LAB — CBC
HCT: 22.8 % — ABNORMAL LOW (ref 39.0–52.0)
Hemoglobin: 7.7 g/dL — ABNORMAL LOW (ref 13.0–17.0)
Hemoglobin: 7.9 g/dL — ABNORMAL LOW (ref 13.0–17.0)
Hemoglobin: 8.3 g/dL — ABNORMAL LOW (ref 13.0–17.0)
MCHC: 34.3 g/dL (ref 30.0–36.0)
MCV: 90.4 fL (ref 78.0–100.0)
Platelets: 200 10*3/uL (ref 150–400)
Platelets: 218 10*3/uL (ref 150–400)
Platelets: 237 10*3/uL (ref 150–400)
RBC: 1.92 MIL/uL — ABNORMAL LOW (ref 4.22–5.81)
RBC: 2.51 MIL/uL — ABNORMAL LOW (ref 4.22–5.81)
RDW: 16.2 % — ABNORMAL HIGH (ref 11.5–15.5)
WBC: 10.8 10*3/uL — ABNORMAL HIGH (ref 4.0–10.5)
WBC: 12.9 10*3/uL — ABNORMAL HIGH (ref 4.0–10.5)
WBC: 9.9 10*3/uL (ref 4.0–10.5)

## 2010-06-29 LAB — PREPARE FRESH FROZEN PLASMA

## 2010-06-29 LAB — CULTURE, BLOOD (ROUTINE X 2): Culture: NO GROWTH

## 2010-06-29 LAB — RENAL FUNCTION PANEL
CO2: 18 mEq/L — ABNORMAL LOW (ref 19–32)
Chloride: 103 mEq/L (ref 96–112)
GFR calc Af Amer: 9 mL/min — ABNORMAL LOW (ref >60)
GFR calc non Af Amer: 8 mL/min — ABNORMAL LOW (ref >60)
Glucose, Bld: 128 mg/dL — ABNORMAL HIGH (ref 70–99)
Sodium: 138 mEq/L (ref 135–145)

## 2010-06-29 LAB — FERRITIN: Ferritin: 521 ng/mL — ABNORMAL HIGH (ref 22–322)

## 2010-06-29 LAB — DIFFERENTIAL
Eosinophils Absolute: 0.2 10*3/uL (ref 0.0–0.7)
Lymphocytes Relative: 30 % (ref 12–46)
Lymphs Abs: 3 10*3/uL (ref 0.7–4.0)
Monocytes Relative: 10 % (ref 3–12)
Neutro Abs: 5.8 10*3/uL (ref 1.7–7.7)
Neutrophils Relative %: 58 % (ref 43–77)

## 2010-06-29 LAB — COMPREHENSIVE METABOLIC PANEL
ALT: 17 U/L (ref 0–53)
Alkaline Phosphatase: 104 U/L (ref 39–117)
BUN: 111 mg/dL — ABNORMAL HIGH (ref 6–23)
CO2: 18 mEq/L — ABNORMAL LOW (ref 19–32)
CO2: 20 mEq/L (ref 19–32)
Calcium: 8.8 mg/dL (ref 8.4–10.5)
Chloride: 109 mEq/L (ref 96–112)
Creatinine, Ser: 7.18 mg/dL — ABNORMAL HIGH (ref 0.4–1.5)
GFR calc non Af Amer: 8 mL/min — ABNORMAL LOW (ref >60)
GFR calc non Af Amer: 8 mL/min — ABNORMAL LOW (ref >60)
Glucose, Bld: 35 mg/dL — CL (ref 70–99)
Glucose, Bld: 93 mg/dL (ref 70–99)
Potassium: 5 mEq/L (ref 3.5–5.1)
Sodium: 144 mEq/L (ref 135–145)
Total Bilirubin: 0.9 mg/dL (ref 0.3–1.2)
Total Protein: 6.2 g/dL (ref 6.0–8.3)

## 2010-06-29 LAB — PROTIME-INR
INR: 1.98 — ABNORMAL HIGH (ref 0.00–1.49)
INR: 2.6 — ABNORMAL HIGH (ref 0.00–1.49)
Prothrombin Time: 22.3 seconds — ABNORMAL HIGH (ref 11.6–15.2)
Prothrombin Time: 27.6 seconds — ABNORMAL HIGH (ref 11.6–15.2)

## 2010-06-29 LAB — CROSSMATCH: Antibody Screen: NEGATIVE

## 2010-06-29 LAB — HEPATITIS B CORE ANTIBODY, TOTAL: Hep B Core Total Ab: POSITIVE — AB

## 2010-06-29 LAB — VANCOMYCIN, RANDOM: Vancomycin Rm: <5 ug/mL

## 2010-06-29 LAB — URINE MICROSCOPIC-ADD ON

## 2010-06-29 LAB — HEPATITIS B SURFACE ANTIBODY,QUALITATIVE: Hep B S Ab: POSITIVE — AB

## 2010-06-29 LAB — BASIC METABOLIC PANEL
BUN: 115 mg/dL — ABNORMAL HIGH (ref 6–23)
GFR calc non Af Amer: 8 mL/min — ABNORMAL LOW (ref >60)
Glucose, Bld: 83 mg/dL (ref 70–99)
Potassium: 6 mEq/L — ABNORMAL HIGH (ref 3.5–5.1)

## 2010-06-29 LAB — RETICULOCYTES
Retic Count, Absolute: 39.8 10*3/uL (ref 19.0–186.0)
Retic Ct Pct: 1.1 % (ref 0.4–3.1)

## 2010-06-29 LAB — FOLATE: Folate: >20 ng/mL

## 2010-06-29 LAB — ALT: ALT: 14 U/L (ref 0–53)

## 2010-06-29 LAB — LACTIC ACID, PLASMA: Lactic Acid, Venous: 0.5 mmol/L (ref 0.5–2.2)

## 2010-06-29 LAB — HEMOCCULT GUIAC POC 1CARD (OFFICE): Fecal Occult Bld: NEGATIVE

## 2010-06-29 LAB — IRON AND TIBC
Saturation Ratios: 19 % — ABNORMAL LOW (ref 20–55)
UIBC: 155 ug/dL

## 2010-06-29 LAB — HEPATITIS B SURFACE ANTIGEN: Hepatitis B Surface Ag: NEGATIVE

## 2010-06-29 LAB — URIC ACID: Uric Acid, Serum: 5.1 mg/dL (ref 4.0–7.8)

## 2010-07-20 LAB — CBC
MCHC: 33.5 g/dL (ref 30.0–36.0)
RDW: 14.1 % (ref 11.5–15.5)

## 2010-07-20 LAB — DIFFERENTIAL
Eosinophils Absolute: 0 10*3/uL (ref 0.0–0.7)
Eosinophils Relative: 0 % (ref 0–5)
Lymphs Abs: 2.5 10*3/uL (ref 0.7–4.0)
Monocytes Absolute: 1.3 10*3/uL — ABNORMAL HIGH (ref 0.1–1.0)
Monocytes Relative: 11 % (ref 3–12)

## 2010-07-20 LAB — URINE MICROSCOPIC-ADD ON

## 2010-07-20 LAB — COMPREHENSIVE METABOLIC PANEL
ALT: 20 U/L (ref 0–53)
AST: 28 U/L (ref 0–37)
Albumin: 4.1 g/dL (ref 3.5–5.2)
Calcium: 9.4 mg/dL (ref 8.4–10.5)
GFR calc Af Amer: >60 mL/min (ref >60)
Sodium: 140 mEq/L (ref 135–145)
Total Protein: 7.5 g/dL (ref 6.0–8.3)

## 2010-07-20 LAB — URINALYSIS, ROUTINE W REFLEX MICROSCOPIC
Glucose, UA: NEGATIVE mg/dL
Hgb urine dipstick: NEGATIVE
pH: 6 (ref 5.0–8.0)

## 2010-07-21 ENCOUNTER — Emergency Department (HOSPITAL_COMMUNITY)
Admission: EM | Admit: 2010-07-21 | Discharge: 2010-07-21 | Disposition: A | Discharge status: Home / Self Care | Payer: Medicare Other | Attending: Emergency Medicine | Admitting: Emergency Medicine

## 2010-07-21 DIAGNOSIS — IMO0001 Reserved for inherently not codable concepts without codable children: Secondary | ICD-10-CM | POA: Insufficient documentation

## 2010-07-21 DIAGNOSIS — Z945 Skin transplant status: Secondary | ICD-10-CM | POA: Insufficient documentation

## 2010-07-21 DIAGNOSIS — N390 Urinary tract infection, site not specified: Secondary | ICD-10-CM | POA: Insufficient documentation

## 2010-07-21 DIAGNOSIS — G35 Multiple sclerosis: Secondary | ICD-10-CM | POA: Insufficient documentation

## 2010-07-21 DIAGNOSIS — R4182 Altered mental status, unspecified: Secondary | ICD-10-CM | POA: Insufficient documentation

## 2010-07-21 LAB — COMPREHENSIVE METABOLIC PANEL
ALT: 20 U/L (ref 0–53)
AST: 25 U/L (ref 0–37)
Albumin: 3.3 g/dL — ABNORMAL LOW (ref 3.5–5.2)
Alkaline Phosphatase: 101 U/L (ref 39–117)
Glucose, Bld: 122 mg/dL — ABNORMAL HIGH (ref 70–99)
Potassium: 3.7 mEq/L (ref 3.5–5.1)
Sodium: 137 mEq/L (ref 135–145)
Total Protein: 7.2 g/dL (ref 6.0–8.3)

## 2010-07-21 LAB — RAPID URINE DRUG SCREEN, HOSP PERFORMED
Amphetamines: NOT DETECTED
Barbiturates: NOT DETECTED

## 2010-07-21 LAB — URINE MICROSCOPIC-ADD ON

## 2010-07-21 LAB — URINALYSIS, ROUTINE W REFLEX MICROSCOPIC
Glucose, UA: NEGATIVE mg/dL
Specific Gravity, Urine: 1.017 (ref 1.005–1.030)
pH: 7.5 (ref 5.0–8.0)

## 2010-07-21 LAB — CBC
Hemoglobin: 11.7 g/dL — ABNORMAL LOW (ref 13.0–17.0)
MCH: 32.1 pg (ref 26.0–34.0)
MCHC: 33.7 g/dL (ref 30.0–36.0)

## 2010-07-21 LAB — DIFFERENTIAL
Basophils Relative: 0 % (ref 0–1)
Eosinophils Absolute: 0.1 10*3/uL (ref 0.0–0.7)
Monocytes Absolute: 0.8 10*3/uL (ref 0.1–1.0)
Monocytes Relative: 10 % (ref 3–12)
Neutro Abs: 4.5 10*3/uL (ref 1.7–7.7)

## 2010-07-21 LAB — ETHANOL: Alcohol, Ethyl (B): <5 mg/dL (ref 0–10)

## 2010-08-24 NOTE — Procedures (Signed)
CEPHALIC VEIN MAPPING   INDICATION:  Evaluate for PermCath access.   HISTORY:  End-stage renal disease.   EXAM:  The right cephalic and basilic veins are compressible.   Diameter measurements range from 0.36 to 0.19 in the cephalic vein and  0.53 to 0.13 in the basilic vein.   The left cephalic and basilic veins are compressible.   Diameter measurements range from 0.33 to 0.12 in cephalic vein and 0.52  to 0.30 in basilic vein.   See attached worksheet for all measurements.   IMPRESSION:  Patent right and left cephalic and basilic veins with  diameters as noted above.   ___________________________________________  Janetta Hora. Fields, MD   NT/MEDQ  D:  10/07/2009  T:  10/07/2009  Job:  [id number]

## 2010-08-24 NOTE — Op Note (Signed)
NAME:  Dale Shields, Dale Shields NO.:  [id number]   MEDICAL RECORD NO.:  [id number]          PATIENT TYPE:  AMB   LOCATION:  SDS                          FACILITY:  [facility]   PHYSICIAN:  Artist Pais. Weingold, M.D.DATE OF BIRTH:  02-Jun-1950   DATE OF PROCEDURE:  01/31/2007  DATE OF DISCHARGE:  01/31/2007                               OPERATIVE REPORT   PREOPERATIVE DIAGNOSIS:  Displaced distal radius fracture on the left  with left carpal tunnel syndrome.   POSTOPERATIVE DIAGNOSIS:  Displaced distal radius fracture on the left  with left carpal tunnel syndrome.   PROCEDURE:  Open reduction and internal fixation of above with DVR plate  and screws and left carpal tunnel release through a separate incision.   SURGEON:  Artist Pais. Mina Marble, M.D.   ASSISTANT:  None.   ANESTHESIA:  General.   TOURNIQUET TIME:  One hour.   COMPLICATIONS:  No complication.   DRAINS:  No drains.   OPERATIVE REPORT:  The patient was taken to the operating suite and  after the induction of adequate general anesthesia, the left upper  extremity was prepped and draped in the usual sterile fashion.  An  Esmarch used to exsanguinate the limb.  The tourniquet was then inflated  to 250 mmHg.  At this point in time, incision was made on the palmar  aspect of the left wrist and in the midline area, an L-shaped and a  large radially based flap was elevated.  The interval between the FCR  and the radial artery was dissected.  Dissection was carried down to the  pronator quadratus.  The pronator quadratus was subperiosteally stripped  off the distal radius, exposing the fracture site.  There was a large  volarly displaced fragment intra-articular on the lunate facet side as  well as on the radial styloid side.  A complete subperiosteal dissection  was undertaken of the volar fragments.  Reduction was performed with  longitudinal traction and pressure over a rolled up towel.  At this  point in time, a  standard DVR left plate was fastened to the volar  aspect of the distal radius through the slotted hole in the plate and  then affixed with a cortical screw.  Intraoperative fluoroscopy revealed  adequate reduction in both AP lateral and oblique view.  The remaining  cortical screws were placed followed by the smooth pegs distally.  At  the end of the procedure, intraoperative fluoroscopy revealed near  anatomic reduction in both the AP lateral and oblique view.  After this  was done, a second incision was made in the palmar aspect of the left  hand in line with the long finger metacarpal starting at Kaplan's  cardinal line.  Skin was incised.  The palmar fascia was identified and  split.  The distal edge of the transverse carpal ligament was identified  and split with a 15 blade.  The median nerve was identified and  protected with a Therapist, nutritional.  The remaining aspect of the transverse  carpal ligament was then divided under direct vision using for  curved scissors.  The canal  was inspected; there were no ganglions or  osseous lesions present.  The wound was irrigated and loosely closed  with 3-0 Prolene subcuticular stitch.  Steri-Strips, 4 x 4's fluffs and  a volar splint was applied.  The patient tolerated both procedures well  and went recovery room in stable fashion.      Artist Pais Mina Marble, M.D.  Electronically Signed     MAW/MEDQ  D:  01/31/2007  T:  02/01/2007  Job:  [id number]

## 2010-08-24 NOTE — Assessment & Plan Note (Signed)
OFFICE VISIT   Dale Shields, Dale Shields  DOB:  February 01, 1951                                       10/07/2009  ZOXWR#:[id number]   HISTORY:  The patient is a 60 year old male referred by Dr. Bascom Levels for  consideration for placement of long-term hemodialysis access.  The  patient is right-handed.  End-stage renal disease is multifactorial.  He  currently is dialyzing via a tunneled left-sided dialysis catheter.  He  also has a right double-lumen catheter in his right internal jugular  vein which is being used currently for blood draws.   Chronic medical problems include multiple sclerosis, end-stage renal  disease, coronary artery disease, COPD, and atrial fibrillation for  which he is on Coumadin.  He has had no prior long-term access  procedures.  Of note, he has had necrotizing fasciitis in his right arm  in January 2011.  This required skin grafting on his forearm and  shoulder.  He currently resides at [facility] and is in the middle of  rehab.   PAST SURGICAL HISTORY:  He has had a right knee arthroscopy and the  above-mentioned procedures.   SOCIAL HISTORY:  He is single.  He is disabled.  He smokes 1 pack of  cigarettes per day.  He was a heavy drinker in the past but quit  drinking in 1988.   FAMILY HISTORY:  Unremarkable.   REVIEW OF SYSTEMS:  A full 12-point review of systems was performed with  the patient today.  Please see intake referral form for details  regarding this.   MEDICATIONS:  His medications include:  Cymbalta, Zofran, Ambien, Xanax,  Percocet, prednisone, Coreg, Coumadin, Plaquenil, aspirin, PhosLo,  albuterol, Protonix, Megace, Colace, Bactroban, renal vitamin, MiraLax,  and levothyroxine.   ALLERGIES:  He has allergy listed to Ultram and sertraline.   PHYSICAL EXAM:  Blood pressure is 146/89 in the left arm, heart rate 94  and regular.  Temperature is 98.2.  HEENT is unremarkable.  Neck has 2+  carotid pulses without bruit.   Chest is clear to auscultation  bilaterally.  Cardiac exam:  Regular rate and rhythm without murmur.  Abdomen:  Soft, nontender, nondistended, no masses.  Musculoskeletal  exam:  He has a 20-degree contracture of his right knee.  Neurologic  exam:  He has symmetric upper extremity motor strength which is 5/5.  He  has no obvious focal weakness.  He ambulates, however, with a cane.  Skin:  He has well-healed skin grafts on his right forearm and right  shoulder.  He has no rashes or ulcers otherwise.  Extremities:  He has  2+ brachial and radial pulse in the right arm.   He had a vein mapping of both upper extremities today which I ordered  and interpreted.  This shows the cephalic vein in the upper arm on the  right side is mostly greater than 3 mm in diameter.  The basilic vein on  the left and right sides are both greater than 3 mm in diameter.  The  cephalic vein on the left side is small and unusable.   In summary, the patient seems to be reasonable candidate for right  brachiocephalic AV fistula.  We have scheduled this for Wednesday, October 14, 2009, with Dr. Hart Rochester.  The risks, benefits, possible complications,  and procedure details were explained to the  patient today including but  not limited to nonmaturation of fistula, bleeding, and infection.  We  will stop his Coumadin 3 days prior to the procedure.     Janetta Hora. Fields, MD  Electronically Signed   CEF/MEDQ  D:  10/08/2009  T:  10/08/2009  Job:  [id number]   cc:   Jarome Matin, M.D.
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
## 2010-08-27 NOTE — Op Note (Signed)
NAME:  Dale Shields, Dale Shields NO.:  [id number]   MEDICAL RECORD NO.:  [id number]          PATIENT TYPE:  OIB   LOCATION:  1510                         FACILITY:  [facility]   PHYSICIAN:  John L. Rendall, M.D.  DATE OF BIRTH:  1950/07/26   DATE OF PROCEDURE:  05/23/2005  DATE OF DISCHARGE:                                 OPERATIVE REPORT   PREOPERATIVE DIAGNOSIS:  Internal derangement right knee.   POSTOPERATIVE DIAGNOSIS:  Complex tear lateral meniscus with osteoarthritis.   SURGICAL PROCEDURES:  Arthroscopic right lateral meniscectomy.   SURGEON:  John L. Rendall, M.D.   ANESTHESIA:  Local with general supplement.   DESCRIPTION OF PROCEDURE:  Under brief general anesthetic, the right knee is  prepared with DuraPrep and draped as a sterile field using a lateral post.  Standard portals are used and normal medial meniscus was found, normal  patellofemoral articulation is found but a complex posterior horn tear of  the lateral meniscus is found. Using a combination of basket forceps and  curved Marlon shaver, this was resected back to stable meniscal rim and  local chondroplasty is done on the damaged femoral condyle and the  fibrillation of the tibia. Once this is completed, punctures are closed with  4-0 nylon, the knee is infiltrated with Marcaine 0.5% with epinephrine and a  sterile compression bandage is applied. The patient is kept in a 23-hour  observation bed due to sleep apnea.      John L. Rendall, M.D.  Electronically Signed     JLR/MEDQ  D:  05/23/2005  T:  05/24/2005  Job:  [id number]

## 2010-08-27 NOTE — Consult Note (Signed)
NAME:  ROMAN, DUBUC NO.:  [id number]   MEDICAL RECORD NO.:  [id number]          PATIENT TYPE:  EMS   LOCATION:  MAJO                         FACILITY:  [facility]   PHYSICIAN:  Pramod P. Pearlean Brownie, MD    DATE OF BIRTH:  12-09-1950   DATE OF CONSULTATION:  DATE OF DISCHARGE:                                   CONSULTATION   REASON FOR REFERRAL:  Multiple sclerosis.   HISTORY OF PRESENT ILLNESS:  Mr. 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  He made subsequent slow neurological recovery.  Three  years later, he had an episode of optic neuritis and has had several such  episodes of optic neuritis involving both eyes or one eye at a time over the  years.  He has been treated with Solu-Medrol and steroids with good results.  He is claims he had been on Rebif and Betaseron for several years, but  discontinued the Rebif 2 years ago.  He has seen several neurologists over  the years including Dr. _____ in [place], as well as a neurologist in [place]  and Dr. Carlos American in [place].  He has recently moved to the [place] area  and has seen Dr. Epimenio Foot in [facility] for a few visits.  In fact,  he was in the office yesterday with symptoms of blurred vision and increased  fatigue.  He was given 1 gram of Solu-Medrol in the office and advised to  come back on Monday.  The patient did not notice any improvement and was  scared to wait until Monday, and, hence, he came to [facility] emergency  room where he was admitted to the family practice teaching service.  The  patient denies any obvious precipitant for this in the form of fever,  infection and other triggers.  He denies any dysuria, but has noted  increased fatigue and some stiffness in his legs.  He states over the years  he has had several brain MRI  scans done, none of which has shown any brain  involvement.  His MS seems to be localized to his spine and his optic  nerves.  He, however, does not state that he has been diagnosed with Devic's  disease.  The patient denies any other past medical problems except  fibromyalgia and chronic pain.   HOME MEDICATIONS:  Methadone.   SOCIAL HISTORY:  The patient has recently moved to [place].  He is on  disability since 44.  Denies smoking, drinking, doing drugs.   REVIEW OF SYSTEMS:  Positive for fatigue, increased weakness and gait  instability, blurred vision.  No headache, double vision, dysarthria.   PHYSICAL EXAMINATION:  GENERAL:  A pleasant middle aged Caucasian male who  is not in distress.  VITAL SIGNS:  He is afebrile with temperature of 97.3, pulse rate 99 per  minute, blood pressure 133/78, respiratory rate 24 per minute, saturation  99%  on room air.  HEENT:  Head is non-traumatic.  ENT exam unremarkable.  NECK:  Supple without no stiffness or bruit.  CARDIAC:  Normal, no gallop.  LUNGS:  Clear to auscultation.  NEUROLOGICAL EXAM:  The patient is pleasant, awake, alert, cooperative with  no aphasia.  apraxia or dysarthria. The pupils are large 6 mm and sluggishly  reactive.  There is some afferent pupillary defect bilaterally.  Fundi  reveal bilateral optic pallor, left more than right.  The patient complains  of severe blurred vision bilaterally.  He is unable to consistently count  fingers at 3 feet.  There is no ophthalmoplegia or nystagmus.  Face is  symmetric.  Palatal movements are normal.  Tongue is midline.  Motor system  exam reveals no upper extremity drift, symmetric strength, tone, reflexes,  coordination, sensation in upper extremities.  Lower extremity exam reveals  good symmetric strength without focal weakness; however, fullness slightly  increased in the left knee.  He has some slightly impaired position  vibration over the toes.  However, touch to  pinprick seems preserved.  Finger-to-nose coordination is accurate.  Knee to heel is slow but fairly  accurate.  His gait was not tested.   DATA REVIEWED/LABS:  WBC count is elevated at 20.2; however, there is only  minimum leftward shift.  UA is negative.  Stress x-ray is pending.  Basic  metabolic panel is normal.   IMPRESSION:  A 54-hour gentleman with a 2-D history of blurred vision,  increased fatigue and some gait difficulties, likely due to optic neuritis  and flare-up of his demyelinating disease.  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   PLAN:  The patient is being admitted to the family practice teaching  service.  I would recommend Solu-Medrol 1 gram daily for 3 days.  Check  chest x-ray and other obvious sources for infection.  MRI scan of the brain,  as well as the orbits to image the optic nerves.  The patient will followup  with his neurologist, Dr. Epimenio Foot in [facility],  in the future as needed.   Thank you for this referral.           ______________________________  Sunny Schlein. Pearlean Brownie, MD     PPS/MEDQ  D:  10/22/2005  T:  10/22/2005  Job:  [id number]

## 2010-09-27 ENCOUNTER — Other Ambulatory Visit (HOSPITAL_BASED_OUTPATIENT_CLINIC_OR_DEPARTMENT_OTHER): Payer: Self-pay | Admitting: Internal Medicine

## 2010-09-27 DIAGNOSIS — R634 Abnormal weight loss: Secondary | ICD-10-CM

## 2010-09-27 DIAGNOSIS — R945 Abnormal results of liver function studies: Secondary | ICD-10-CM

## 2010-09-29 ENCOUNTER — Encounter (HOSPITAL_COMMUNITY): Payer: Self-pay | Admitting: Radiology

## 2010-09-29 ENCOUNTER — Other Ambulatory Visit: Payer: Medicare Other

## 2010-09-29 ENCOUNTER — Inpatient Hospital Stay (HOSPITAL_COMMUNITY)
Admission: EM | Admit: 2010-09-29 | Discharge: 2010-10-07 | DRG: 871 | Disposition: A | Discharge status: Skilled Nursing Facility | Payer: Medicare Other | Attending: Internal Medicine | Admitting: Internal Medicine

## 2010-09-29 ENCOUNTER — Emergency Department (HOSPITAL_COMMUNITY): Payer: Medicare Other

## 2010-09-29 DIAGNOSIS — G35 Multiple sclerosis: Secondary | ICD-10-CM | POA: Diagnosis present

## 2010-09-29 DIAGNOSIS — A419 Sepsis, unspecified organism: Secondary | ICD-10-CM | POA: Diagnosis present

## 2010-09-29 DIAGNOSIS — M1990 Unspecified osteoarthritis, unspecified site: Secondary | ICD-10-CM | POA: Diagnosis present

## 2010-09-29 DIAGNOSIS — G894 Chronic pain syndrome: Secondary | ICD-10-CM | POA: Diagnosis present

## 2010-09-29 DIAGNOSIS — E46 Unspecified protein-calorie malnutrition: Secondary | ICD-10-CM | POA: Diagnosis present

## 2010-09-29 DIAGNOSIS — N390 Urinary tract infection, site not specified: Secondary | ICD-10-CM | POA: Diagnosis present

## 2010-09-29 DIAGNOSIS — R630 Anorexia: Secondary | ICD-10-CM | POA: Diagnosis present

## 2010-09-29 DIAGNOSIS — IMO0001 Reserved for inherently not codable concepts without codable children: Secondary | ICD-10-CM | POA: Diagnosis present

## 2010-09-29 DIAGNOSIS — J4489 Other specified chronic obstructive pulmonary disease: Secondary | ICD-10-CM | POA: Diagnosis present

## 2010-09-29 DIAGNOSIS — K219 Gastro-esophageal reflux disease without esophagitis: Secondary | ICD-10-CM | POA: Diagnosis present

## 2010-09-29 DIAGNOSIS — D649 Anemia, unspecified: Secondary | ICD-10-CM | POA: Diagnosis present

## 2010-09-29 DIAGNOSIS — N179 Acute kidney failure, unspecified: Secondary | ICD-10-CM | POA: Diagnosis present

## 2010-09-29 DIAGNOSIS — R198 Other specified symptoms and signs involving the digestive system and abdomen: Secondary | ICD-10-CM | POA: Diagnosis present

## 2010-09-29 DIAGNOSIS — I330 Acute and subacute infective endocarditis: Secondary | ICD-10-CM | POA: Diagnosis present

## 2010-09-29 DIAGNOSIS — J449 Chronic obstructive pulmonary disease, unspecified: Secondary | ICD-10-CM | POA: Diagnosis present

## 2010-09-29 DIAGNOSIS — K5900 Constipation, unspecified: Secondary | ICD-10-CM | POA: Diagnosis not present

## 2010-09-29 DIAGNOSIS — N184 Chronic kidney disease, stage 4 (severe): Secondary | ICD-10-CM | POA: Diagnosis present

## 2010-09-29 DIAGNOSIS — R7402 Elevation of levels of lactic acid dehydrogenase (LDH): Secondary | ICD-10-CM | POA: Diagnosis present

## 2010-09-29 DIAGNOSIS — R7401 Elevation of levels of liver transaminase levels: Secondary | ICD-10-CM | POA: Diagnosis present

## 2010-09-29 DIAGNOSIS — F411 Generalized anxiety disorder: Secondary | ICD-10-CM | POA: Diagnosis present

## 2010-09-29 DIAGNOSIS — E039 Hypothyroidism, unspecified: Secondary | ICD-10-CM | POA: Diagnosis present

## 2010-09-29 DIAGNOSIS — A4102 Sepsis due to Methicillin resistant Staphylococcus aureus: Principal | ICD-10-CM | POA: Diagnosis present

## 2010-09-29 HISTORY — DX: Scoliosis, unspecified: M41.9

## 2010-09-29 HISTORY — DX: Multiple sclerosis: G35

## 2010-09-29 HISTORY — DX: Fibromyalgia: M79.7

## 2010-09-29 LAB — COMPREHENSIVE METABOLIC PANEL
Albumin: 2 g/dL — ABNORMAL LOW (ref 3.5–5.2)
Alkaline Phosphatase: 424 U/L — ABNORMAL HIGH (ref 39–117)
BUN: 36 mg/dL — ABNORMAL HIGH (ref 6–23)
Chloride: 95 mEq/L — ABNORMAL LOW (ref 96–112)
Creatinine, Ser: 2.07 mg/dL — ABNORMAL HIGH (ref 0.50–1.35)
GFR calc Af Amer: 40 mL/min — ABNORMAL LOW (ref >60)
GFR calc non Af Amer: 33 mL/min — ABNORMAL LOW (ref >60)
Glucose, Bld: 120 mg/dL — ABNORMAL HIGH (ref 70–99)
Potassium: 2.9 mEq/L — ABNORMAL LOW (ref 3.5–5.1)
Total Bilirubin: 0.5 mg/dL (ref 0.3–1.2)

## 2010-09-29 LAB — DIFFERENTIAL
Eosinophils Absolute: 0.2 10*3/uL (ref 0.0–0.7)
Lymphs Abs: 1.8 10*3/uL (ref 0.7–4.0)
Monocytes Absolute: 1.2 10*3/uL — ABNORMAL HIGH (ref 0.1–1.0)
Neutrophils Relative %: 82 % — ABNORMAL HIGH (ref 43–77)
WBC Morphology: INCREASED

## 2010-09-29 LAB — LIPID PANEL
Cholesterol: 111 mg/dL (ref 0–200)
HDL: 17 mg/dL — ABNORMAL LOW (ref >39)
Total CHOL/HDL Ratio: 6.5 RATIO
Triglycerides: 130 mg/dL (ref <150)
VLDL: 26 mg/dL (ref 0–40)

## 2010-09-29 LAB — CBC
MCHC: 32.4 g/dL (ref 30.0–36.0)
MCV: 93.1 fL (ref 78.0–100.0)
Platelets: 408 10*3/uL — ABNORMAL HIGH (ref 150–400)
RDW: 14.3 % (ref 11.5–15.5)
WBC: 17.6 10*3/uL — ABNORMAL HIGH (ref 4.0–10.5)

## 2010-09-29 LAB — PROTIME-INR: Prothrombin Time: 15.1 seconds (ref 11.6–15.2)

## 2010-09-29 LAB — LACTIC ACID, PLASMA: Lactic Acid, Venous: 1 mmol/L (ref 0.5–2.2)

## 2010-09-29 LAB — URINALYSIS, ROUTINE W REFLEX MICROSCOPIC
Bilirubin Urine: NEGATIVE
Nitrite: POSITIVE — AB
Protein, ur: 100 mg/dL — AB
Specific Gravity, Urine: 1.017 (ref 1.005–1.030)
Urobilinogen, UA: 1 mg/dL (ref 0.0–1.0)

## 2010-09-29 LAB — TSH: TSH: 2.66 u[IU]/mL (ref 0.350–4.500)

## 2010-09-29 LAB — CK TOTAL AND CKMB (NOT AT ARMC)
CK, MB: 1.6 ng/mL (ref 0.3–4.0)
Relative Index: INVALID (ref 0.0–2.5)
Total CK: 34 U/L (ref 7–232)

## 2010-09-29 LAB — URINE MICROSCOPIC-ADD ON

## 2010-09-29 LAB — PROCALCITONIN
Procalcitonin: 0.95 ng/mL
Procalcitonin: 0.59 ng/mL

## 2010-09-29 LAB — APTT: aPTT: 40 seconds — ABNORMAL HIGH (ref 24–37)

## 2010-09-29 LAB — PRO B NATRIURETIC PEPTIDE: Pro B Natriuretic peptide (BNP): 3606 pg/mL — ABNORMAL HIGH (ref 0–125)

## 2010-09-29 LAB — OCCULT BLOOD, POC DEVICE: Fecal Occult Bld: NEGATIVE

## 2010-09-29 LAB — MRSA PCR SCREENING: MRSA by PCR: NEGATIVE

## 2010-09-30 ENCOUNTER — Other Ambulatory Visit (HOSPITAL_COMMUNITY): Payer: Medicare Other

## 2010-09-30 DIAGNOSIS — R7881 Bacteremia: Secondary | ICD-10-CM

## 2010-09-30 DIAGNOSIS — B958 Unspecified staphylococcus as the cause of diseases classified elsewhere: Secondary | ICD-10-CM

## 2010-09-30 DIAGNOSIS — N390 Urinary tract infection, site not specified: Secondary | ICD-10-CM

## 2010-09-30 LAB — COMPREHENSIVE METABOLIC PANEL
ALT: 77 U/L — ABNORMAL HIGH (ref 0–53)
AST: 63 U/L — ABNORMAL HIGH (ref 0–37)
Alkaline Phosphatase: 395 U/L — ABNORMAL HIGH (ref 39–117)
CO2: 24 mEq/L (ref 19–32)
Calcium: 7.9 mg/dL — ABNORMAL LOW (ref 8.4–10.5)
Chloride: 104 mEq/L (ref 96–112)
GFR calc Af Amer: 56 mL/min — ABNORMAL LOW (ref >60)
GFR calc non Af Amer: 46 mL/min — ABNORMAL LOW (ref >60)
Glucose, Bld: 111 mg/dL — ABNORMAL HIGH (ref 70–99)
Potassium: 5.5 mEq/L — ABNORMAL HIGH (ref 3.5–5.1)
Sodium: 137 mEq/L (ref 135–145)

## 2010-09-30 LAB — CBC
Hemoglobin: 6.8 g/dL — CL (ref 13.0–17.0)
MCH: 31.2 pg (ref 26.0–34.0)
MCHC: 33.2 g/dL (ref 30.0–36.0)
RDW: 14.5 % (ref 11.5–15.5)

## 2010-09-30 LAB — CK TOTAL AND CKMB (NOT AT ARMC)
CK, MB: 1.5 ng/mL (ref 0.3–4.0)
Total CK: 31 U/L (ref 7–232)
Total CK: 35 U/L (ref 7–232)

## 2010-09-30 LAB — TROPONIN I: Troponin I: <0.3 ng/mL (ref <0.30)

## 2010-10-01 ENCOUNTER — Inpatient Hospital Stay (HOSPITAL_COMMUNITY): Payer: Medicare Other

## 2010-10-01 DIAGNOSIS — I38 Endocarditis, valve unspecified: Secondary | ICD-10-CM

## 2010-10-01 LAB — TYPE AND SCREEN: Antibody Screen: NEGATIVE

## 2010-10-01 LAB — BASIC METABOLIC PANEL
BUN: 20 mg/dL (ref 6–23)
CO2: 24 mEq/L (ref 19–32)
Chloride: 108 mEq/L (ref 96–112)
Creatinine, Ser: 1.26 mg/dL (ref 0.50–1.35)
Potassium: 4.8 mEq/L (ref 3.5–5.1)

## 2010-10-01 LAB — HEPATIC FUNCTION PANEL
ALT: 60 U/L — ABNORMAL HIGH (ref 0–53)
AST: 33 U/L (ref 0–37)
Total Protein: 5.9 g/dL — ABNORMAL LOW (ref 6.0–8.3)

## 2010-10-01 LAB — URINE CULTURE
Colony Count: >=100000
Culture  Setup Time: 201206201750

## 2010-10-01 LAB — CBC
Platelets: 431 10*3/uL — ABNORMAL HIGH (ref 150–400)
RBC: 2.88 MIL/uL — ABNORMAL LOW (ref 4.22–5.81)
WBC: 15.2 10*3/uL — ABNORMAL HIGH (ref 4.0–10.5)

## 2010-10-02 LAB — CULTURE, BLOOD (ROUTINE X 2): Culture  Setup Time: 201206202227

## 2010-10-02 LAB — BASIC METABOLIC PANEL
BUN: 22 mg/dL (ref 6–23)
Calcium: 8.3 mg/dL — ABNORMAL LOW (ref 8.4–10.5)
Creatinine, Ser: 1.33 mg/dL (ref 0.50–1.35)
GFR calc Af Amer: >60 mL/min (ref >60)
GFR calc non Af Amer: 55 mL/min — ABNORMAL LOW (ref >60)
Glucose, Bld: 100 mg/dL — ABNORMAL HIGH (ref 70–99)
Potassium: 5 mEq/L (ref 3.5–5.1)

## 2010-10-02 LAB — HEPATIC FUNCTION PANEL
AST: 26 U/L (ref 0–37)
Alkaline Phosphatase: 336 U/L — ABNORMAL HIGH (ref 39–117)
Bilirubin, Direct: 0.1 mg/dL (ref 0.0–0.3)
Total Bilirubin: 0.3 mg/dL (ref 0.3–1.2)

## 2010-10-02 LAB — CBC
HCT: 27.9 % — ABNORMAL LOW (ref 39.0–52.0)
Hemoglobin: 9.5 g/dL — ABNORMAL LOW (ref 13.0–17.0)
MCV: 94.6 fL (ref 78.0–100.0)
RDW: 14.9 % (ref 11.5–15.5)
WBC: 14.7 10*3/uL — ABNORMAL HIGH (ref 4.0–10.5)

## 2010-10-03 LAB — COMPREHENSIVE METABOLIC PANEL
ALT: 34 U/L (ref 0–53)
AST: 24 U/L (ref 0–37)
Albumin: 1.6 g/dL — ABNORMAL LOW (ref 3.5–5.2)
Calcium: 8.3 mg/dL — ABNORMAL LOW (ref 8.4–10.5)
Creatinine, Ser: 1.09 mg/dL (ref 0.50–1.35)
GFR calc non Af Amer: >60 mL/min (ref >60)
Sodium: 137 mEq/L (ref 135–145)
Total Protein: 5.9 g/dL — ABNORMAL LOW (ref 6.0–8.3)

## 2010-10-03 LAB — CBC
MCH: 31 pg (ref 26.0–34.0)
MCHC: 33.2 g/dL (ref 30.0–36.0)
MCV: 93.4 fL (ref 78.0–100.0)
Platelets: 526 10*3/uL — ABNORMAL HIGH (ref 150–400)
RBC: 3.16 MIL/uL — ABNORMAL LOW (ref 4.22–5.81)
RDW: 14.3 % (ref 11.5–15.5)

## 2010-10-04 DIAGNOSIS — R7881 Bacteremia: Secondary | ICD-10-CM

## 2010-10-04 DIAGNOSIS — B958 Unspecified staphylococcus as the cause of diseases classified elsewhere: Secondary | ICD-10-CM

## 2010-10-04 DIAGNOSIS — N390 Urinary tract infection, site not specified: Secondary | ICD-10-CM

## 2010-10-04 LAB — CBC
HCT: 28.8 % — ABNORMAL LOW (ref 39.0–52.0)
Hemoglobin: 9.4 g/dL — ABNORMAL LOW (ref 13.0–17.0)
MCH: 30.6 pg (ref 26.0–34.0)
MCHC: 32.6 g/dL (ref 30.0–36.0)
RBC: 3.07 MIL/uL — ABNORMAL LOW (ref 4.22–5.81)
WBC: 10.2 10*3/uL (ref 4.0–10.5)

## 2010-10-04 LAB — BASIC METABOLIC PANEL
BUN: 20 mg/dL (ref 6–23)
CO2: 27 mEq/L (ref 19–32)
Calcium: 8.4 mg/dL (ref 8.4–10.5)
Chloride: 101 mEq/L (ref 96–112)
GFR calc non Af Amer: >60 mL/min (ref >60)
Glucose, Bld: 100 mg/dL — ABNORMAL HIGH (ref 70–99)
Sodium: 134 mEq/L — ABNORMAL LOW (ref 135–145)

## 2010-10-05 ENCOUNTER — Inpatient Hospital Stay (HOSPITAL_COMMUNITY): Payer: Medicare Other

## 2010-10-05 LAB — VANCOMYCIN, RANDOM: Vancomycin Rm: 23.8 ug/mL

## 2010-10-05 MED ORDER — GADOBENATE DIMEGLUMINE 529 MG/ML IV SOLN: 14.0000 mL | Freq: Once | INTRAVENOUS | Status: DC

## 2010-10-07 ENCOUNTER — Inpatient Hospital Stay (HOSPITAL_COMMUNITY): Payer: Medicare Other

## 2010-10-07 LAB — GLUCOSE, CAPILLARY: Glucose-Capillary: 76 mg/dL (ref 70–99)

## 2010-10-10 LAB — CULTURE, BLOOD (ROUTINE X 2)
Culture  Setup Time: 201206251557
Culture  Setup Time: 201206251557
Culture: NO GROWTH

## 2010-10-10 NOTE — H&P (Signed)
NAME:  Dale Shields, Dale Shields NO.:  [id number]  MEDICAL RECORD NO.:  [id number]  LOCATION:  [facility]                         FACILITY:  [facility]  PHYSICIAN:  Dale Shields, M.D.      DATE OF BIRTH:  1950-07-22  DATE OF ADMISSION:  09/29/2010 DATE OF DISCHARGE:                             HISTORY & PHYSICAL   PRIMARY CARE PHYSICIAN:  Dale Caul, MD  PRESENTING COMPLAINT:  Abnormal labs and generalized weakness.  HISTORY OF PRESENT ILLNESS:  The patient is a 60 year old gentleman who is currently a nursing home resident and being followed by Dr. Leanord Shields, previously Dr. Leretha Shields.  He had presented with progressive generalized weakness and anorexia over a 2 months period.  He also has some cough and generally feels unwell.  He had a Shields work in his nursing facility, which was abnormal so he was sent to the emergency room.  The patient had had acute renal failure with dialysis at least twice.  In both times, he has had his catheter removed because he no longer required dialysis.  He has not required dialysis in the long time, so he recently had his catheter pulled.  If had one episode of fever, but lot of time has had chills at night.  He has been coughing, but nonproductive as mentioned and denied chest pain.  Denied any rhinorrhea.  No sore throat.  No hemoptysis.  He has no shortness of breath and no nasal congestions, but he has generalized myalgias.  The patient also has other multiple abnormalities including multiple sclerosis and fibromyalgia as well as COPD, which seemed to all be under control.  In the ED, he was found to be hypotensive and evidence of SIRS with evidence of UTI indicating sepsis.  PAST MEDICAL HISTORY:  Significant for, 1. Osteoarthritis. 2. 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 3. COPD. 4. Fibromyalgia. 5. GERD. 6. Multiple sclerosis. 7. Narcolepsy. 8. History of scoliosis. 9.  Hypertension. 10.Prior history of possible PE was treated empirically with Coumadin. 11.History of morbid obesity. 12.Hypothyroidism. 13.History of epistaxis. 14.Anxiety disorder. 15.The patient had prior skin grafts.  ALLERGIES: 1. ULTRAM. 2. ZOLOFT.  CURRENT MEDICATIONS:  Albuterol, Ambien, Coreg, Cymbalta, morphine, OxyContin, and simvastatin.  A full list of his medication is not entirely available.  SOCIAL HISTORY:  The patient lives in [place].  He currently is at nursing facility since 2009/08/29.  He used to smoke extensively, but quit many months ago, also no alcohol.  No IV drug use.  FAMILY HISTORY:  Mother had Alzheimer dementia.  He is also living at nursing facility.  His father died in the year 1998/08/30 from unknown cause.  REVIEW OF SYSTEMS:  All systems reviewed and currently negative except per HPI.  PHYSICAL EXAMINATION:  VITAL SIGNS:  His temperature here is 98.3 rectally, Shields pressure of 85/41, currently 109/72 after fluid boluses, his pulse is 56, respiratory rate 18, and sats 98% on 2 L. GENERAL:  The patient looks chronically ill-looking.  He is in no acute distress. HEENT:  Normocephalic and anicteric.  PERRL.  EOM I.  He is anicteric. No pallor, no rhinorrhea. NECK:  Supple.  No significant lymphadenopathy.  No JVD. RESPIRATORY:  He has good air entry bilaterally.  No significant wheezing.  No rales.  No crackles. CARDIOVASCULAR:  He has S1, S2.  No audible murmur. ABDOMEN:  Soft, full, nontender with positive bowel sounds. EXTREMITIES:  No edema, cyanosis, or clubbing. SKIN:  The patient has a large skin graft patch on his right shoulder extending to the anterior upper chest and upper back, which seemed to have healed nicely, although there is a small area of erythema at the anterior and superior lateral aspect of his right shoulder.  Otherwise no other ulcers are observed. MUSCULOSKELETAL:  He has complained of left and right shoulder pain, joint  pains which is slightly tender, but not swollen and no evidence of localized change.  No temperature change over the joint spaces.  LABORATORY DATA:  Sodium is 135, potassium 2.9, chloride 95, CO2 of 28, glucose 120, BUN 36, and creatinine 2.07.  His alkaline phosphatase is 424, AST 76, ALT 98, total protein 6.8, albumin 2.0 with a calcium of 8.5.  His PT is 15.1, INR 1.17, and PTT of 40.  Fecal occult Shields testing is negative.  His white count is 17.6 with left shift, ANC of 14.4.  His hemoglobin is 7.8 and platelet count 408.  Lactic acid level is 1.0.  Urinalysis showed red urine which is turbid, large Shields, proteinuria, positive nitrite, and large leukocyte esterase.  Urine microscopy showed wbc's too numerous to count, rbc 3-6 with few bacteria.  IMAGING DATA:  Chest x-ray showed probable COPD.  CT abdomen and pelvis showed no definite acute intra-abdominal or intrapelvic process questionable bladder wall thickening versus artifact from under distention.  There is questionable rectal wall thickness, we cannot exclude rectal tumor, recommend endoscopic assessment, right basilar atelectasis and small effusion, and nonobstructive left renal calculus.  ASSESSMENT:  This is a 60 year old gentleman presenting with multiple medical problems, the most importantly sepsis, evidence of urinary tract infection, some renal calculus, chronic kidney disease for severe anemia with question of possible rectal mass.  PLAN: 1. Sepsis.  We will admit the patient to Step-down Unit.  We will     hydrate him aggressively due to his hypotension.  Since he is     responding to fluids at this point, so he does not seem to be in     shock.  I will cover him with vancomycin and Primaxin for broad-     spectrum coverage after obtaining Shields cultures and urine     cultures.  We will subsequently narrow down his antibiotic for     further coverage once he is more stable. 2. UTI.  Again, we will continue  his urine cultures and cover him with     broad-spectrum antibiotics.  I will add quinolone to cover the UTI     in addition to the Primaxin.  Zosyn would have been a good choice     for broad, however, this is resistant in environment for Zosyn. 3. Chronic kidney disease.  The patient now requires dialysis.  His     BUN and creatinine seems stable.  We will follow closely in     hospital.  Positive for catheter infection.  This may be     contributing to his current sepsis profile.  Again, he will be on     broad-spectrum antibiotics and we will await Shields cultures. 4. Severe anemia.  Seems like anemia of chronic disease based on his  previous hemoglobin being 11.7 back in April and now question of     possible rectal mass.  We now can think of this as possible GI     bleed.  However, his stool guaiac is negative today and his MCV is     93 indicating that this is normocytic anemia.  With his history of     chronic kidney disease, we will presume this as prerenal that this     is probably secondary to chronic disease.  I will type and cross     match him and transfuse 1 unit of packed red Shields cells due to his     hypotension and generalized debilitation.  I will continue to check     his stool guaiac and if it turns positive, we may need to get a GI     to evaluate possible rectal mass. 5. COPD seems stable.  We will continue with nebulizers empirically in     the hospital. 6. History of multiple sclerosis, this is also stable. 7. GERD.  I will continue with PPIs in the hospital.  Increase in his     LFTs.  CT abdomen and pelvis did not show any evidence of     gallbladder disease or common bile duct involvement.  More than     likely, this is as a result of sepsis with dehydration.  We will     follow his LFTs closely. 8. Hypothyroidism.  Check TSH and if needed, we will put him on     Synthroid. 9. Hypoalbuminemia.  Due to protein-calorie malnutrition, probably     from his  chronic anorexia and poor intake.  Once we figure out what     is going with the patient, we will hopefully try and get him on     some increased nutrition.  Other medical problems are all chronic     and will need to be observed closely.     Dale Shields, M.D.     Verlin Grills  D:  09/29/2010  T:  09/29/2010  Job:  [id number]  Electronically Signed by Dale Shields M.D. on 10/10/2010 12:45:07 AM

## 2010-10-14 ENCOUNTER — Encounter: Payer: Self-pay | Admitting: Gastroenterology

## 2010-10-14 NOTE — Discharge Summary (Signed)
NAME:  Dale Shields, Dale Shields NO.:  [id number]  MEDICAL RECORD NO.:  [id number]  LOCATION:  2508                         FACILITY:  [facility]  PHYSICIAN:  Clydia Llano, MD       DATE OF BIRTH:  01-22-1951  DATE OF ADMISSION:  09/29/2010 DATE OF DISCHARGE:                        DISCHARGE SUMMARY - REFERRING   PRIMARY CARE PHYSICIAN:  Maxwell Caul, MD  NEPHROLOGIST:  Jarome Matin, MD  REASON FOR ADMISSION:  Abnormal labs and generalized weakness.  DISCHARGE DIAGNOSES: 1. Infective endocarditis 2. Methicillin-resistant Staphylococcus aureus bacteremia. 3. Methicillin-resistant Staphylococcus aureus UTI. 4. Chronic pain syndrome. 5. Chronic anemia. 6. Questionable rectal mass, will require colonoscopy as outpatient. 7. Full-thickness insertional tear of the anterior fiber of     supraspinatus tendon. 8. Acute renal failure resolved. 9. Chronic obstructive pulmonary disease. 10.Gastroesophageal reflux disease. 11.History of multiple sclerosis. 12.Hypothyroidism. 13.Prior history of possible pulmonary embolism treated with Coumadin.  DISCHARGE MEDICATIONS: 1. MiraLax 17 grams p.o. daily, hold for diarrhea. 2. Vancomycin 1 gram daily, take for 34 more days. 3. Advair Diskus 250/50 inhaled b.i.d. 4. Aricept 5 mg p.o. daily. 5. Aspirin 81 mg p.o. daily. 6. Cymbalta 20 mg p.o. daily. 7. Morphine sulfate 60 mg p.o. b.i.d. 8. Multivitamin 1 tablet p.o. daily. 9. Nuvigil 250 mg p.o. b.i.d. 10.Oxycodone 10 mg every 6 hours as needed for breakthrough pain. 11.ProAir inhaler 2 puffs inhaled every 6 hours as needed for     shortness of breath. 12.Vitamin C 1000 mg p.o. b.i.d. 13.Xanax 0.5 mg 1/2 tablet p.o. 3 times daily as needed for anxiety. 14.Ambien 10 mg daily at bedtime for insomnia.  BRIEF HISTORY/EXAMINATION:  Dale Shields is 59 year old white male with past medical history of acute renal failure, narcolepsy, multiple sclerosis, and PE.  The patient  came into the hospital complaining about generalized weakness, anorexia for 2 months.  The patient has some cough and generally feel unwell.  Blood work in the nursing facility which was abnormal, so it was sent to the emergency room.  The patient had an acute renal failure, was put on dialysis for some time.  In the meantime, his catheter removed recently because no longer required dialysis.  The patient denies high-grade fever but he said he has a lot of chills at night.  RADIOLOGY: 1. MRI of the left shoulder June 26 showed nonspecific sign of right     shoulder intra-articular or periarticular infection.  There is full-     thickness insertional tear of the anterior fibers of supraspinatus     tendon.  There is moderate associated muscular atrophy.  There is     mild glenohumeral and moderate acromioclavicular degenerative     changes. 2. Abdominal ultrasound on June 22 showed mild diffuse increasing     echotexture of both kidneys correlating with known underlying renal     disease, small amount of right perinephric fluid, and small     bilateral pleural effusion.  June 20, CT abdomen and pelvis showed     no definite acute intra-abdominal or intrapelvic process.  There is     questionable bladder wall thickening versus artifact under     distention.  There was  questionable rectal wall thickening, cannot     exclude rectal tumor, recommended endoscopic assessment.  There is     right basilar atelectasis. 3. Chest x-ray June 20 showed probable COPD.  No active lung disease.  PROCEDURES:  Transesophageal echocardiogram showed there are at least 3 masses in the right atrium 14 x 22 mm near the orifice of the SVC.  It appears to be near tip of a catheter.  Second appears adherent to the interatrial septum 7 x 13 mm.  Third is in the mid septum of fossa ovalis again at the end of the septum measures 12 x 9 mm.  MICROBIOLOGY RESULTS:  Blood culture showed MRSA.  BRIEF HOSPITAL  COURSE: 1. Infective endocarditis.  The patient admitted to the hospital and     blood cultures were obtained.  Positive blood cultures have grown     MRSA.  Infectious disease consultation was obtained.  They     recommended for ultrasound because of the history of night sweats.     Recommended echocardiogram because of history of night sweats.  The     patient undergone transesophageal echocardiogram which showed at     least 3 masses in the right atrium.  The patient started on     vancomycin to complete 6 weeks of antibiotics.  The patient already     had 8 days of vancomycin and he is to continue for 34 more days.     The patient has PICC line and he is going for rehab. 2. UTI.  The patient has MRSA UTI which had been treated same as the     MRSA bacteremia with vancomycin.  This is likely to be hematogenous     spread rather than ascending UTI. 3. Acute renal failure resolved.  The patient had recent history of     severe renal failure which required dialysis.  From that probably     the dialysis catheter have contributed to the infective     endocarditis.  The patient's creatinine was 7.3 but now his     creatinine back to normal.  It is 1.1 on the day of discharge. 4. Questionable rectal mass.  This is what showed up in the CT scan     and they did recommend assessment by colonoscopy.  The patient     needs colonoscopy as outpatient. 5. Chronic shoulder and knee pain.  The patient has osteoarthritis.     He did have tremendous amount of left and right shoulder pain.     Right shoulder MRI was obtained to rule out septic arthritis which     showed torn supraspinatus tendon.  The patient needs orthopedic as     outpatient for further evaluation. 6. Anemia.  The patient's anemia is likely to be secondary to his     renal disease history.  His hemoglobin went down to 6.8.  He had     some transfusion on the day of discharge, has hemoglobin 9.4 that     is very stable. 7. MRSA  septicemia.  This is as in #1 treated with vancomycin.  The     total intended length of antibiotic treatment is 6 weeks.  That was     started on June 20.  The patient to go for surgery 4 more days from     today's date. 8. Chronic pain syndrome.  The patient has been on opiates.  The     patient on MS Contin 60 mg  and Percocet that was not changed during     this hospitalization.  He was on fentanyl patch as well as 125 mcg     and he was able to discontinue that.  DISCHARGE INSTRUCTIONS: 1. Activity as tolerated. 2. Regular diet. 3. Disposition, skilled nursing facility.     Clydia Llano, MD     ME/MEDQ  D:  10/07/2010  T:  10/07/2010  Job:  [id number]  cc:   Jarome Matin, M.D.  Electronically Signed by Clydia Llano  on 10/14/2010 10:17:27 PM

## 2010-10-28 NOTE — Progress Notes (Signed)
NAME:  Dale Shields, Dale Shields NO.:  [id number]  MEDICAL RECORD NO.:  [id number]  LOCATION:  2508                         FACILITY:  [facility]  PHYSICIAN:  Mauro Kaufmann, MD         DATE OF BIRTH:  Feb 29, 1951                                PROGRESS NOTE   DATE OF DISCHARGE: To be determined.  ADMISSION DIAGNOSES: 1. Sepsis. 2. Urinary tract infection. 3. Chronic kidney disease. 4. Severe anemia. 5. Chronic obstructive pulmonary disease. 6. History of multiple sclerosis. 7. Gastroesophageal reflux disease. 8. Hypothyroidism. 9. Hypoalbuminemia.  DISCHARGE DIAGNOSES: 1. Infective endocarditis currently on IV vancomycin. 2. Urinary tract infection. 3. Chronic pain syndrome. 4. Chronic anemia. 5. Questionable rectal mass, will require colonoscopy as outpatient. 6. Full-thickness insertional tear of the anterior fibers of the     supraspinatus tendon.  The patient to have surgery as outpatient     per Orthopedics. 7. Acute renal failure, resolved. 8. Chronic obstructive pulmonary disease. 9. Gastroesophageal reflux disease. 10.History of multiple sclerosis. 11.Hypothyroidism.  Tests performed during the hospital stay include, 1. Chest x-ray on September 29, 2010 showed a probable COPD and nodular     lung disease. 2. CT of the abdomen and pelvis showed no definite acute intra-     abdominal or intrapelvic process, questionable bladder wall     thickening versus artifact from underdistention, questionable     rectal wall thickening cannot exclude rectal tumor.  Recommend     endoscopic assessment.  Right basilar atelectasis and small     effusion, nonobstructive left renal calculus. 3. Abdominal ultrasound showed mild diffuse increase in echotexture of     both kidneys correlating with known underlying end-stage renal     disease.  Small amount of right perinephric fluid and small     bilateral pleural effusions. 4. MRI of the right shoulder showed no specific signs of  right     shoulder intraarticular or periarticular infection.  Full-thickness     insertional tear of the anterior fibers of the supraspinatus     tendon.  There is moderate-to-severe muscular atrophy.  Mild     glenohumeral and moderate acromioclavicular degenerative changes.     There is nonspecific widening of the acromioclavicular joint with     mild marrow edema and enhancement in distal clavicle and adjacent     acromion.  Degeneration of the superior labrum with probable     partial split tear of the long head of the biceps tendon.  MICROBIOLOGY RESULTS: Urine culture on September 29, 2010 showed MRSA.  Blood cultures done on September 29, 2010 showed MRSA sensitive to vancomycin.  Repeat blood cultures on October 03, 2008 showed no growth as yet.  The patient's white count on day of admission was 17,000, as of October 04, 2010, the white count is 10.2, hemoglobin was 6.8.  The patient is status post blood transfusion and now hemoglobin is 9.4.  The patient's renal function has improved. Renal function on the day of admission was 1.55, at this time it is 1.12.  BRIEF HISTORY AND PHYSICAL: A 60 year old gentleman who is currently a nursing home resident being followed by Dr. Leanord Hawking came  with progressive generalized weakness, anorexia over the past 2 months.  Also had some cough and generally felt unwell.  He had blood work done in the nursing facility which was abnormal, so he was sent to the emergency room.  The patient had acute renal failure with dialysis at least twice and the both times he had his catheter removed because he no longer required dialysis.  He has not required dialysis in the long time, so recently he had his catheter pulled out.  The patient says that he had catheter for a long time.  The patient had episode of fever and chills at night.  The patient was found to be hypotensive and there was evidence of SIRS with evidence of UTI indicating sepsis.  The patient was  hypotensive and so he was started on IV fluids with vancomycin and Primaxin for broad-spectrum to cover for sepsis.  BRIEF HOSPITAL COURSE: 1. Infective endocarditis.  The patient had positive blood cultures     growing for MRSA, so a Infectious Disease consultation was     obtained.  The patient underwent transesophageal echocardiogram     which showed at least 3 masses in the right atrium,      2 attached to the intra-atrial septum consistent     with vegetation.  Throughout this time, the patient has been     continued on vancomycin.  The plan is to give vancomycin for 35     more days.  The patient is on day #7 of vancomycin.  The patient     will need a PICC line for that and he needs to go to rehab. 2. UTI.  The patient also has MRSA growing from the urine which is     sensitive to vancomycin and he will be continued on IV vancomycin. 3. Questionable rectal mass.  The CAT scan of the abdomen showed there     is a question of rectal mass.  The patient will need a colonoscopy     as outpatient. 4. Chronic shoulder and knee pain.  The patient has a tear of the     supraspinatus tendon as per the MRI report and he will need surgery     as outpatient.  The patient has appointment to see Orthopedics as     outpatient. 5. Acute renal failure, has resolved.  The patient was given IV     fluids. 6. Anemia.  The patient was anemic.  He was given blood transfusion.     Occult blood was negative.  Hemoglobin and hematocrit has remained     stable at this time. 7. Transaminitis.  The patient has mild transaminitis which has     resolved, which was most likely secondary to the sepsis.  On today's exam, the patient's chest is clear to auscultation bilaterally.  HEART:  S1 and S2, regular rate and rhythm.  Abdomen is soft, nontender.  No organomegaly.  EXTREMITIES:  No cyanosis, no clubbing, no edema.  Plan is as above.  Discharge medications to be dictated by the discharging  physician.     Mauro Kaufmann, MD     GL/MEDQ  D:  10/05/2010  T:  10/05/2010  Job:  [id number]  Electronically Signed by Sibyl Parr Jasmyne Lodato  on 10/28/2010 08:01:45 AM

## 2010-11-24 ENCOUNTER — Ambulatory Visit: Payer: Medicare Other | Admitting: Gastroenterology

## 2010-12-12 ENCOUNTER — Emergency Department (HOSPITAL_COMMUNITY)
Admission: EM | Admit: 2010-12-12 | Discharge: 2010-12-12 | Disposition: A | Discharge status: Home / Self Care | Payer: Medicare Other | Attending: Emergency Medicine | Admitting: Emergency Medicine

## 2010-12-12 DIAGNOSIS — N289 Disorder of kidney and ureter, unspecified: Secondary | ICD-10-CM | POA: Insufficient documentation

## 2010-12-12 DIAGNOSIS — N390 Urinary tract infection, site not specified: Secondary | ICD-10-CM | POA: Insufficient documentation

## 2010-12-12 DIAGNOSIS — R339 Retention of urine, unspecified: Secondary | ICD-10-CM | POA: Insufficient documentation

## 2010-12-12 DIAGNOSIS — G35 Multiple sclerosis: Secondary | ICD-10-CM | POA: Insufficient documentation

## 2010-12-12 LAB — URINALYSIS, ROUTINE W REFLEX MICROSCOPIC
Glucose, UA: NEGATIVE mg/dL
Specific Gravity, Urine: 1.013 (ref 1.005–1.030)
pH: 8.5 — ABNORMAL HIGH (ref 5.0–8.0)

## 2010-12-12 LAB — URINE MICROSCOPIC-ADD ON

## 2010-12-12 LAB — POCT I-STAT, CHEM 8
BUN: 30 mg/dL — ABNORMAL HIGH (ref 6–23)
Chloride: 103 mEq/L (ref 96–112)
Creatinine, Ser: 1.4 mg/dL — ABNORMAL HIGH (ref 0.50–1.35)
Sodium: 140 mEq/L (ref 135–145)

## 2010-12-14 LAB — URINE CULTURE

## 2011-01-13 ENCOUNTER — Inpatient Hospital Stay (HOSPITAL_COMMUNITY)
Admission: EM | Admit: 2011-01-13 | Discharge: 2011-01-17 | DRG: 698 | Disposition: A | Discharge status: Home Health | Payer: Medicare Other | Attending: Internal Medicine | Admitting: Internal Medicine

## 2011-01-13 ENCOUNTER — Emergency Department (HOSPITAL_COMMUNITY): Payer: Medicare Other

## 2011-01-13 DIAGNOSIS — Z7982 Long term (current) use of aspirin: Secondary | ICD-10-CM

## 2011-01-13 DIAGNOSIS — Z8614 Personal history of Methicillin resistant Staphylococcus aureus infection: Secondary | ICD-10-CM

## 2011-01-13 DIAGNOSIS — E039 Hypothyroidism, unspecified: Secondary | ICD-10-CM | POA: Diagnosis present

## 2011-01-13 DIAGNOSIS — F17200 Nicotine dependence, unspecified, uncomplicated: Secondary | ICD-10-CM | POA: Diagnosis present

## 2011-01-13 DIAGNOSIS — Y846 Urinary catheterization as the cause of abnormal reaction of the patient, or of later complication, without mention of misadventure at the time of the procedure: Secondary | ICD-10-CM | POA: Diagnosis present

## 2011-01-13 DIAGNOSIS — D72829 Elevated white blood cell count, unspecified: Secondary | ICD-10-CM | POA: Diagnosis present

## 2011-01-13 DIAGNOSIS — A419 Sepsis, unspecified organism: Secondary | ICD-10-CM | POA: Diagnosis present

## 2011-01-13 DIAGNOSIS — N182 Chronic kidney disease, stage 2 (mild): Secondary | ICD-10-CM | POA: Diagnosis present

## 2011-01-13 DIAGNOSIS — N390 Urinary tract infection, site not specified: Secondary | ICD-10-CM | POA: Diagnosis present

## 2011-01-13 DIAGNOSIS — IMO0001 Reserved for inherently not codable concepts without codable children: Secondary | ICD-10-CM | POA: Diagnosis present

## 2011-01-13 DIAGNOSIS — M67919 Unspecified disorder of synovium and tendon, unspecified shoulder: Secondary | ICD-10-CM | POA: Diagnosis present

## 2011-01-13 DIAGNOSIS — G894 Chronic pain syndrome: Secondary | ICD-10-CM | POA: Diagnosis present

## 2011-01-13 DIAGNOSIS — J438 Other emphysema: Secondary | ICD-10-CM | POA: Diagnosis present

## 2011-01-13 DIAGNOSIS — R748 Abnormal levels of other serum enzymes: Secondary | ICD-10-CM | POA: Diagnosis present

## 2011-01-13 DIAGNOSIS — Z86718 Personal history of other venous thrombosis and embolism: Secondary | ICD-10-CM

## 2011-01-13 DIAGNOSIS — R5381 Other malaise: Secondary | ICD-10-CM | POA: Diagnosis present

## 2011-01-13 DIAGNOSIS — K219 Gastro-esophageal reflux disease without esophagitis: Secondary | ICD-10-CM | POA: Diagnosis present

## 2011-01-13 DIAGNOSIS — I129 Hypertensive chronic kidney disease with stage 1 through stage 4 chronic kidney disease, or unspecified chronic kidney disease: Secondary | ICD-10-CM | POA: Diagnosis present

## 2011-01-13 DIAGNOSIS — M129 Arthropathy, unspecified: Secondary | ICD-10-CM | POA: Diagnosis present

## 2011-01-13 DIAGNOSIS — T83511A Infection and inflammatory reaction due to indwelling urethral catheter, initial encounter: Principal | ICD-10-CM | POA: Diagnosis present

## 2011-01-13 DIAGNOSIS — F609 Personality disorder, unspecified: Secondary | ICD-10-CM | POA: Diagnosis present

## 2011-01-13 DIAGNOSIS — M4120 Other idiopathic scoliosis, site unspecified: Secondary | ICD-10-CM | POA: Diagnosis present

## 2011-01-13 DIAGNOSIS — F064 Anxiety disorder due to known physiological condition: Secondary | ICD-10-CM | POA: Diagnosis present

## 2011-01-13 DIAGNOSIS — N400 Enlarged prostate without lower urinary tract symptoms: Secondary | ICD-10-CM | POA: Diagnosis present

## 2011-01-13 DIAGNOSIS — G35 Multiple sclerosis: Secondary | ICD-10-CM | POA: Diagnosis present

## 2011-01-13 DIAGNOSIS — F3289 Other specified depressive episodes: Secondary | ICD-10-CM | POA: Diagnosis present

## 2011-01-13 DIAGNOSIS — N319 Neuromuscular dysfunction of bladder, unspecified: Secondary | ICD-10-CM | POA: Diagnosis present

## 2011-01-13 DIAGNOSIS — D638 Anemia in other chronic diseases classified elsewhere: Secondary | ICD-10-CM | POA: Diagnosis present

## 2011-01-13 DIAGNOSIS — G47419 Narcolepsy without cataplexy: Secondary | ICD-10-CM | POA: Diagnosis present

## 2011-01-13 DIAGNOSIS — M719 Bursopathy, unspecified: Secondary | ICD-10-CM | POA: Diagnosis present

## 2011-01-13 DIAGNOSIS — N179 Acute kidney failure, unspecified: Secondary | ICD-10-CM | POA: Diagnosis present

## 2011-01-13 DIAGNOSIS — F329 Major depressive disorder, single episode, unspecified: Secondary | ICD-10-CM | POA: Diagnosis present

## 2011-01-13 DIAGNOSIS — Z79899 Other long term (current) drug therapy: Secondary | ICD-10-CM

## 2011-01-13 DIAGNOSIS — H469 Unspecified optic neuritis: Secondary | ICD-10-CM | POA: Diagnosis present

## 2011-01-13 DIAGNOSIS — E860 Dehydration: Secondary | ICD-10-CM | POA: Diagnosis present

## 2011-01-13 LAB — COMPREHENSIVE METABOLIC PANEL
ALT: 51 U/L (ref 0–53)
AST: 51 U/L — ABNORMAL HIGH (ref 0–37)
CO2: 26 mEq/L (ref 19–32)
Calcium: 9.4 mg/dL (ref 8.4–10.5)
Chloride: 92 mEq/L — ABNORMAL LOW (ref 96–112)
GFR calc non Af Amer: 24 mL/min — ABNORMAL LOW (ref >90)
Sodium: 132 mEq/L — ABNORMAL LOW (ref 135–145)
Total Bilirubin: 0.2 mg/dL — ABNORMAL LOW (ref 0.3–1.2)

## 2011-01-13 LAB — URINE MICROSCOPIC-ADD ON

## 2011-01-13 LAB — DIFFERENTIAL
Basophils Relative: 0 % (ref 0–1)
Eosinophils Absolute: 0.2 10*3/uL (ref 0.0–0.7)
Eosinophils Relative: 1 % (ref 0–5)
Lymphocytes Relative: 15 % (ref 12–46)
Neutro Abs: 11.5 10*3/uL — ABNORMAL HIGH (ref 1.7–7.7)

## 2011-01-13 LAB — URINALYSIS, ROUTINE W REFLEX MICROSCOPIC
Bilirubin Urine: NEGATIVE
Glucose, UA: NEGATIVE mg/dL
Specific Gravity, Urine: 1.016 (ref 1.005–1.030)
Urobilinogen, UA: 1 mg/dL (ref 0.0–1.0)
pH: 7.5 (ref 5.0–8.0)

## 2011-01-13 LAB — CBC
HCT: 31.9 % — ABNORMAL LOW (ref 39.0–52.0)
Hemoglobin: 10.5 g/dL — ABNORMAL LOW (ref 13.0–17.0)
MCV: 95.8 fL (ref 78.0–100.0)
RDW: 13.9 % (ref 11.5–15.5)
WBC: 15.8 10*3/uL — ABNORMAL HIGH (ref 4.0–10.5)

## 2011-01-13 LAB — PROTIME-INR
INR: 1.1 (ref 0.00–1.49)
Prothrombin Time: 14.4 seconds (ref 11.6–15.2)

## 2011-01-14 LAB — BASIC METABOLIC PANEL
Calcium: 9 mg/dL (ref 8.4–10.5)
Creatinine, Ser: 2.78 mg/dL — ABNORMAL HIGH (ref 0.50–1.35)
GFR calc Af Amer: 27 mL/min — ABNORMAL LOW (ref >90)
GFR calc non Af Amer: 24 mL/min — ABNORMAL LOW (ref >90)
Sodium: 136 mEq/L (ref 135–145)

## 2011-01-14 LAB — CREATININE, URINE, RANDOM: Creatinine, Urine: 104.1 mg/dL

## 2011-01-14 LAB — MRSA PCR SCREENING: MRSA by PCR: POSITIVE — AB

## 2011-01-15 LAB — CBC
HCT: 29 % — ABNORMAL LOW (ref 39.0–52.0)
MCHC: 32.1 g/dL (ref 30.0–36.0)
RDW: 14.4 % (ref 11.5–15.5)
WBC: 12.8 10*3/uL — ABNORMAL HIGH (ref 4.0–10.5)

## 2011-01-15 LAB — DIFFERENTIAL
Basophils Relative: 1 % (ref 0–1)
Eosinophils Absolute: 0.3 10*3/uL (ref 0.0–0.7)
Eosinophils Relative: 2 % (ref 0–5)
Lymphocytes Relative: 15 % (ref 12–46)
Monocytes Absolute: 1.3 10*3/uL — ABNORMAL HIGH (ref 0.1–1.0)
Neutrophils Relative %: 72 % (ref 43–77)

## 2011-01-15 LAB — COMPREHENSIVE METABOLIC PANEL
Albumin: 2.2 g/dL — ABNORMAL LOW (ref 3.5–5.2)
Alkaline Phosphatase: 158 U/L — ABNORMAL HIGH (ref 39–117)
BUN: 54 mg/dL — ABNORMAL HIGH (ref 6–23)
Creatinine, Ser: 2.16 mg/dL — ABNORMAL HIGH (ref 0.50–1.35)
GFR calc Af Amer: 37 mL/min — ABNORMAL LOW (ref >90)
Glucose, Bld: 112 mg/dL — ABNORMAL HIGH (ref 70–99)
Total Protein: 6.4 g/dL (ref 6.0–8.3)

## 2011-01-16 LAB — URINE CULTURE
Colony Count: NO GROWTH
Culture  Setup Time: 201210060053
Culture: NO GROWTH
Special Requests: NEGATIVE

## 2011-01-16 LAB — BASIC METABOLIC PANEL
CO2: 28 mEq/L (ref 19–32)
Chloride: 103 mEq/L (ref 96–112)
Glucose, Bld: 95 mg/dL (ref 70–99)
Potassium: 3.6 mEq/L (ref 3.5–5.1)
Sodium: 137 mEq/L (ref 135–145)

## 2011-01-16 LAB — CBC
Hemoglobin: 9.4 g/dL — ABNORMAL LOW (ref 13.0–17.0)
MCH: 30.9 pg (ref 26.0–34.0)
Platelets: 260 10*3/uL (ref 150–400)
RBC: 3.04 MIL/uL — ABNORMAL LOW (ref 4.22–5.81)
WBC: 14.1 10*3/uL — ABNORMAL HIGH (ref 4.0–10.5)

## 2011-01-17 LAB — DIFFERENTIAL
Eosinophils Absolute: 0.4 10*3/uL (ref 0.0–0.7)
Eosinophils Relative: 3 % (ref 0–5)
Lymphs Abs: 2.4 10*3/uL (ref 0.7–4.0)
Monocytes Absolute: 1.3 10*3/uL — ABNORMAL HIGH (ref 0.1–1.0)
Neutrophils Relative %: 65 % (ref 43–77)

## 2011-01-17 LAB — BASIC METABOLIC PANEL
BUN: 28 mg/dL — ABNORMAL HIGH (ref 6–23)
Calcium: 8.7 mg/dL (ref 8.4–10.5)
GFR calc Af Amer: 58 mL/min — ABNORMAL LOW (ref >90)
GFR calc non Af Amer: 50 mL/min — ABNORMAL LOW (ref >90)
Glucose, Bld: 75 mg/dL (ref 70–99)
Sodium: 139 mEq/L (ref 135–145)

## 2011-01-17 LAB — CBC
Hemoglobin: 8.8 g/dL — ABNORMAL LOW (ref 13.0–17.0)
MCH: 30.9 pg (ref 26.0–34.0)
MCHC: 31 g/dL (ref 30.0–36.0)
RDW: 14.3 % (ref 11.5–15.5)

## 2011-01-19 LAB — CBC
HCT: 32 — ABNORMAL LOW
Hemoglobin: 10.7 — ABNORMAL LOW
MCHC: 33.5
MCV: 93
Platelets: 240
RBC: 3.44 — ABNORMAL LOW
RDW: 14
WBC: 9.2

## 2011-01-19 LAB — COMPREHENSIVE METABOLIC PANEL
ALT: 18
AST: 30
Albumin: 3.3 — ABNORMAL LOW
Alkaline Phosphatase: 92
BUN: 13
CO2: 32
Calcium: 8.8
Chloride: 94 — ABNORMAL LOW
Creatinine, Ser: 0.64
GFR calc Af Amer: >60
GFR calc non Af Amer: >60
Glucose, Bld: 113 — ABNORMAL HIGH
Potassium: 3.8
Sodium: 132 — ABNORMAL LOW
Total Bilirubin: 0.3
Total Protein: 6.1

## 2011-01-21 ENCOUNTER — Encounter: Payer: Self-pay | Admitting: Gastroenterology

## 2011-01-21 LAB — CULTURE, BLOOD (ROUTINE X 2)
Culture  Setup Time: 201210060011
Culture  Setup Time: 201210060011
Culture: NO GROWTH

## 2011-02-11 ENCOUNTER — Telehealth: Payer: Self-pay | Admitting: *Deleted

## 2011-02-11 NOTE — Telephone Encounter (Signed)
Mr. Sedano is asking if we can see him sooner so he can be set up for his colonoscopy.  He's anxious to know if the rectal mass seen on the CT scan is cancerous.  Due to his other medical issues this exam had to be delayed.  Thanks  ,Wyona Almas

## 2011-02-15 NOTE — Telephone Encounter (Signed)
Patient informed that we scheduled him a sooner appointment 02/18/11.

## 2011-02-18 ENCOUNTER — Encounter: Payer: Self-pay | Admitting: Internal Medicine

## 2011-02-18 ENCOUNTER — Ambulatory Visit (INDEPENDENT_AMBULATORY_CARE_PROVIDER_SITE_OTHER): Payer: Medicare Other | Admitting: Internal Medicine

## 2011-02-18 VITALS — BP 132/68 | HR 88 | Ht 70.0 in | Wt 157.0 lb

## 2011-02-18 DIAGNOSIS — Z86010 Personal history of colonic polyps: Secondary | ICD-10-CM

## 2011-02-18 DIAGNOSIS — R933 Abnormal findings on diagnostic imaging of other parts of digestive tract: Secondary | ICD-10-CM

## 2011-02-18 MED ORDER — PEG-KCL-NACL-NASULF-NA ASC-C 100 G PO SOLR: 1.0000 | Freq: Once | ORAL | Status: DC

## 2011-02-18 NOTE — Patient Instructions (Addendum)
You have been given a separate informational sheet regarding your tobacco use, the importance of quitting and local resources to help you quit. You have been scheduled for a Colonoscopy with separate instructions given. Your prep kit has been sent to your pharmacy for you to pick up. 

## 2011-02-18 NOTE — Progress Notes (Signed)
Subjective:    Patient ID: Dale Shields, male    DOB: 1950-09-21, 60 y.o.   MRN: [id number]  HPI this 60 year old white man sent for evaluation of possible rectal mass seen on CT scan. 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 A CT scan indicated that the rectal wall was thickened the could not exclude a rectal tumor. He has no active GI complaints at this time.  Outpatient Prescriptions Prior to Visit  Medication Sig Dispense Refill  . albuterol (PROVENTIL HFA;VENTOLIN HFA) 108 (90 BASE) MCG/ACT inhaler Inhale 1 puff into the lungs every 4 (four) hours as needed. For breathing       . ALPRAZolam (XANAX) 0.5 MG tablet Take 1/2-1 tablet by mouth three times daily as needed for anxiety       . Armodafinil (NUVIGIL) 250 MG tablet Take 125 mg by mouth daily. for narcolepsy and multiple sclerosis       . ascorbic acid (VITAMIN C) 1000 MG tablet Take 1,000 mg by mouth 3 (three) times daily.        Marland Kitchen aspirin 81 MG tablet Take 81 mg by mouth daily.        . carvedilol (COREG) 12.5 MG tablet Take 12.5 mg by mouth 2 (two) times daily with a meal.        . donepezil (ARICEPT ODT) 5 MG disintegrating tablet Take 5 mg by mouth daily. For memory       . eszopiclone (LUNESTA) 2 MG TABS Take 2 mg by mouth at bedtime. As needed for sleep        . morphine (MS CONTIN) 60 MG 12 hr tablet Take 60 mg by mouth every 8 (eight) hours as needed. for pain        . Multiple Vitamins-Iron (MULTIVITAMIN/IRON) TABS Take 1-2 tablets by mouth daily       . Oxycodone HCl 10 MG TABS Take 1 tablet up to six times daily to control pain as needed       . polyethylene glycol powder (MIRALAX) powder Take 1 teaspoon by mouth daily as needed       . simvastatin (ZOCOR) 20 MG tablet Take 20 mg by mouth daily. For cholesterol       . vitamin E 1000 UNIT capsule Take 1,000 Units by mouth daily.        Marland Kitchen zolpidem (AMBIEN CR) 6.25 MG CR tablet Take 6.25 mg by mouth at bedtime as needed. For rest       .  DULoxetine (CYMBALTA) 20 MG capsule Take 20 mg by mouth daily. For nerves        . varenicline (CHANTIX STARTING MONTH PAK) 0.5 MG X 11 & 1 MG X 42 tablet as directed.         Allergies  Allergen Reactions  . Ultram (Tramadol Hcl)   . Zoloft    Past Medical History  Diagnosis Date  . Arthritis   . Emphysema   . Fibromyalgia   . Multiple sclerosis     with optic neuritis  . Narcolepsy   . Scoliosis   . Hypothyroidism   . HLD (hyperlipidemia)   . Anemia of chronic disease   . Thrombocytopenia   . Anxiety and depression   . Pulmonary embolism   . COPD (chronic obstructive pulmonary disease)   . ESRD (end stage renal disease)   . Insomnia   . Carpal tunnel syndrome   . DDD (degenerative disc disease)   . GERD (  gastroesophageal reflux disease)   . BPH (benign prostatic hypertrophy)   . History of gout   . Neurogenic bladder   . Optic neuritis   . Steroid-induced psychosis   . History of epistaxis   . History of MRSA infection   . Endocarditis   . Supraspinatus tendon tear    Past Surgical History  Procedure Date  . Meniscectomy   . Carpal tunnel release     left withradius fracture repair  . Nasal cautery   . Skin graft    Family History  Problem Relation Age of Onset  . Alzheimer's disease Mother   . Colon cancer Neg Hx    History   Social History  . Marital Status: Single    Spouse Name: N/A    Number of Children: N/A  . Years of Education: N/A   Occupational History  . Not on file.   Social History Main Topics  . Smoking status: Current Everyday Smoker -- 1.0 packs/day for 40 years    Types: Cigarettes  . Smokeless tobacco: Never Used   Comment: Counseling sheet to quit smoking given in exam room   . Alcohol Use: No  . Drug Use: No  . Sexually Active: Not on file   Other Topics Concern  . Not on file   Social History Narrative  . No narrative on file      Review of Systems Some memory disturbance, chronic pain syndrome. Multiple joint  pain issues.    Objective:   Physical Exam General: Mildly chronically ill white male no acute distress Eyes: anicteric Lungs: clear Heart: S1S2 no rubs, murmurs or gallops Abdomen: soft and nontender, BS+ - rectal exam shows no mass. Brown stool. Prostate is normal. Ext: no edema          Assessment & Plan:   1. Nonspecific (abnormal) findings on radiological and other examination of gastrointestinal tract   2. Personal history of colonic polyps     Rectal thickening is probably a radiologic abnormality only and not true. No mass on rectal exam. However cannot be sure. Given that he tells me he has a history of polyps on 2 prior colonoscopies in 2000 2006 with one each, I will go ahead and have him do a full colonoscopy. Risks benefits indications are explained he understands and agrees to proceed. If his psychiatric and neurologic comorbidities and medications, I think deep sedation with propofol makes the most sense. I explained to them as well and he understands and agrees to proceed.

## 2011-02-25 ENCOUNTER — Other Ambulatory Visit: Payer: Medicare Other | Admitting: Gastroenterology

## 2011-02-25 ENCOUNTER — Other Ambulatory Visit: Payer: Self-pay | Admitting: Gastroenterology

## 2011-03-07 ENCOUNTER — Ambulatory Visit: Payer: Medicare Other | Admitting: Internal Medicine

## 2011-03-15 ENCOUNTER — Encounter: Payer: Self-pay | Admitting: Internal Medicine

## 2011-03-15 ENCOUNTER — Ambulatory Visit (AMBULATORY_SURGERY_CENTER): Payer: Medicare Other | Admitting: Internal Medicine

## 2011-03-15 VITALS — BP 131/66 | HR 54 | Temp 97.2°F | Resp 18 | Ht 70.0 in | Wt 157.0 lb

## 2011-03-15 DIAGNOSIS — R6889 Other general symptoms and signs: Secondary | ICD-10-CM

## 2011-03-15 DIAGNOSIS — Z860100 Personal history of colon polyps, unspecified: Secondary | ICD-10-CM

## 2011-03-15 DIAGNOSIS — K5730 Diverticulosis of large intestine without perforation or abscess without bleeding: Secondary | ICD-10-CM

## 2011-03-15 DIAGNOSIS — D126 Benign neoplasm of colon, unspecified: Secondary | ICD-10-CM

## 2011-03-15 DIAGNOSIS — R933 Abnormal findings on diagnostic imaging of other parts of digestive tract: Secondary | ICD-10-CM

## 2011-03-15 MED ORDER — SODIUM CHLORIDE 0.9 % IV SOLN: 500.0000 mL | INTRAVENOUS | Status: DC

## 2011-03-15 NOTE — Progress Notes (Signed)
Propofol administered per [facility]. See scanned intra procedure report. ewm 

## 2011-03-15 NOTE — Progress Notes (Signed)
Patient did not experience any of the following events: a burn prior to discharge; a fall within the facility; wrong site/side/patient/procedure/implant event; or a hospital transfer or hospital admission upon discharge from the facility. (G8907) Patient did not have preoperative order for IV antibiotic SSI prophylaxis. (G8918)  

## 2011-03-15 NOTE — Patient Instructions (Addendum)
The rectum was normal. One very small polyp was removed, I suspect it was benign and will let you know. You have diverticulosis - pockets in the colon - common and not usually a problem. Iva Boop, MD, [facility] Discharge instructions given with verbal understanding. Handout on polyp given  Resume previous medications.

## 2011-03-16 ENCOUNTER — Telehealth: Payer: Self-pay | Admitting: *Deleted

## 2011-03-16 NOTE — Telephone Encounter (Signed)
No answer at # given.  Message left on voicemail. 

## 2011-03-21 ENCOUNTER — Encounter: Payer: Self-pay | Admitting: Internal Medicine

## 2011-03-21 NOTE — Progress Notes (Signed)
Quick Note:  Diminutive adenoma Repeat colonoscpy 5 years ______

## 2011-09-19 ENCOUNTER — Encounter (HOSPITAL_COMMUNITY): Payer: Self-pay | Admitting: *Deleted

## 2011-09-19 ENCOUNTER — Emergency Department (HOSPITAL_COMMUNITY)
Admission: EM | Admit: 2011-09-19 | Discharge: 2011-09-19 | Disposition: A | Discharge status: Home / Self Care | Payer: Medicare Other | Attending: Emergency Medicine | Admitting: Emergency Medicine

## 2011-09-19 DIAGNOSIS — R3989 Other symptoms and signs involving the genitourinary system: Secondary | ICD-10-CM | POA: Insufficient documentation

## 2011-09-19 DIAGNOSIS — N390 Urinary tract infection, site not specified: Secondary | ICD-10-CM | POA: Insufficient documentation

## 2011-09-19 DIAGNOSIS — K219 Gastro-esophageal reflux disease without esophagitis: Secondary | ICD-10-CM | POA: Insufficient documentation

## 2011-09-19 DIAGNOSIS — J4489 Other specified chronic obstructive pulmonary disease: Secondary | ICD-10-CM | POA: Insufficient documentation

## 2011-09-19 DIAGNOSIS — N186 End stage renal disease: Secondary | ICD-10-CM | POA: Insufficient documentation

## 2011-09-19 DIAGNOSIS — G35 Multiple sclerosis: Secondary | ICD-10-CM | POA: Insufficient documentation

## 2011-09-19 DIAGNOSIS — J449 Chronic obstructive pulmonary disease, unspecified: Secondary | ICD-10-CM | POA: Insufficient documentation

## 2011-09-19 DIAGNOSIS — E785 Hyperlipidemia, unspecified: Secondary | ICD-10-CM | POA: Insufficient documentation

## 2011-09-19 DIAGNOSIS — N319 Neuromuscular dysfunction of bladder, unspecified: Secondary | ICD-10-CM | POA: Insufficient documentation

## 2011-09-19 DIAGNOSIS — F17200 Nicotine dependence, unspecified, uncomplicated: Secondary | ICD-10-CM | POA: Insufficient documentation

## 2011-09-19 DIAGNOSIS — E039 Hypothyroidism, unspecified: Secondary | ICD-10-CM | POA: Insufficient documentation

## 2011-09-19 LAB — URINALYSIS, ROUTINE W REFLEX MICROSCOPIC
Bilirubin Urine: NEGATIVE
Ketones, ur: NEGATIVE mg/dL
Nitrite: POSITIVE — AB
pH: 5.5 (ref 5.0–8.0)

## 2011-09-19 LAB — CBC
MCH: 32.3 pg (ref 26.0–34.0)
MCHC: 34.1 g/dL (ref 30.0–36.0)
Platelets: 354 10*3/uL (ref 150–400)
RDW: 13.7 % (ref 11.5–15.5)

## 2011-09-19 LAB — URINE MICROSCOPIC-ADD ON

## 2011-09-19 LAB — BASIC METABOLIC PANEL
Calcium: 9 mg/dL (ref 8.4–10.5)
GFR calc Af Amer: 78 mL/min — ABNORMAL LOW (ref >90)
GFR calc non Af Amer: 67 mL/min — ABNORMAL LOW (ref >90)
Potassium: 4.3 mEq/L (ref 3.5–5.1)
Sodium: 136 mEq/L (ref 135–145)

## 2011-09-19 MED ORDER — CIPROFLOXACIN IN D5W 400 MG/200ML IV SOLN
400.0000 mg | Freq: Once | INTRAVENOUS | Status: DC
  Administered 2011-09-19: 400 mg via INTRAVENOUS
  Filled 2011-09-19: qty 200

## 2011-09-19 MED ORDER — MORPHINE SULFATE 2 MG/ML IJ SOLN: 2.0000 mg | Freq: Once | INTRAMUSCULAR | Status: AC

## 2011-09-19 MED ORDER — OXYCODONE-ACETAMINOPHEN 5-325 MG PO TABS
1.0000 | ORAL_TABLET | Freq: Once | ORAL | Status: AC
  Administered 2011-09-19: 1 via ORAL
  Filled 2011-09-19: qty 1

## 2011-09-19 MED ORDER — OXYCODONE-ACETAMINOPHEN 5-325 MG PO TABS: 1.0000 | ORAL_TABLET | ORAL | Status: AC | PRN

## 2011-09-19 MED ORDER — PHENAZOPYRIDINE HCL 95 MG PO TABS: 95.0000 mg | ORAL_TABLET | Freq: Three times a day (TID) | ORAL | Status: AC | PRN

## 2011-09-19 MED ORDER — CIPROFLOXACIN HCL 500 MG PO TABS: 500.0000 mg | ORAL_TABLET | Freq: Two times a day (BID) | ORAL | Status: AC

## 2011-09-19 MED ORDER — MORPHINE SULFATE 4 MG/ML IJ SOLN
3.0000 mg | Freq: Once | INTRAMUSCULAR | Status: AC
  Administered 2011-09-19: 4 mg via INTRAVENOUS
  Filled 2011-09-19: qty 1

## 2011-09-19 MED ORDER — MORPHINE SULFATE 2 MG/ML IJ SOLN
2.0000 mg | Freq: Once | INTRAMUSCULAR | Status: AC
  Administered 2011-09-19: 2 mg via INTRAVENOUS
  Filled 2011-09-19: qty 1

## 2011-09-19 MED ORDER — MORPHINE SULFATE 2 MG/ML IJ SOLN
INTRAMUSCULAR | Status: AC
  Administered 2011-09-19: 2 mg via INTRAVENOUS
  Filled 2011-09-19: qty 1

## 2011-09-19 MED ORDER — OXYBUTYNIN CHLORIDE 5 MG PO TABS
5.0000 mg | ORAL_TABLET | Freq: Once | ORAL | Status: AC
  Administered 2011-09-19: 5 mg via ORAL
  Filled 2011-09-19: qty 1

## 2011-09-19 NOTE — Discharge Instructions (Signed)
You have a UTI related to catheter. Will treat with antibiotics. Urine culture is pending, so if bacteria is different then you may need to switch. Please follow up with your PCP in the next week. You may use percocet or pyridium in the next 1-2 days but if pain or symptoms worsen then you need to return to the emergency department.  If you have nausea, vomiting, worsening symptoms, fevers then return to the emergency department.   Urinary Tract Infection Infections of the urinary tract can start in several places. A bladder infection (cystitis), a kidney infection (pyelonephritis), and a prostate infection (prostatitis) are different types of urinary tract infections (UTIs). They usually get better if treated with medicines (antibiotics) that kill germs. Take all the medicine until it is gone. You or your child may feel better in a few days, but TAKE ALL MEDICINE or the infection may not respond and may become more difficult to treat. HOME CARE INSTRUCTIONS   Drink enough water and fluids to keep the urine clear or pale yellow. Cranberry juice is especially recommended, in addition to large amounts of water.   Avoid caffeine, tea, and carbonated beverages. They tend to irritate the bladder.   Alcohol may irritate the prostate.   Only take over-the-counter or prescription medicines for pain, discomfort, or fever as directed by your caregiver.  To prevent further infections:  Empty the bladder often. Avoid holding urine for long periods of time.   After a bowel movement, women should cleanse from front to back. Use each tissue only once.   Empty the bladder before and after sexual intercourse.  FINDING OUT THE RESULTS OF YOUR TEST Not all test results are available during your visit. If your or your child's test results are not back during the visit, make an appointment with your caregiver to find out the results. Do not assume everything is normal if you have not heard from your caregiver or  the medical facility. It is important for you to follow up on all test results. SEEK MEDICAL CARE IF:   There is back pain.   Your baby is older than 3 months with a rectal temperature of 100.5 F (38.1 C) or higher for more than 1 day.   Your or your child's problems (symptoms) are no better in 3 days. Return sooner if you or your child is getting worse.  SEEK IMMEDIATE MEDICAL CARE IF:   There is severe back pain or lower abdominal pain.   You or your child develops chills.   You have a fever.   Your baby is older than 3 months with a rectal temperature of 102 F (38.9 C) or higher.   Your baby is 18 months old or younger with a rectal temperature of 100.4 F (38 C) or higher.   There is nausea or vomiting.   There is continued burning or discomfort with urination.  MAKE SURE YOU:   Understand these instructions.   Will watch your condition.   Will get help right away if you are not doing well or get worse.  Document Released: 01/05/2005 Document Revised: 03/17/2011 Document Reviewed: 08/10/2006 [facility] Patient Information 2012 [place], [place].

## 2011-09-19 NOTE — ED Provider Notes (Signed)
I saw and evaluated the patient, reviewed the resident's note and I agree with the findings and plan.  Pt presents with urinary pain and spasms.  UA suggest UTI.  No sign of bladder outlet obstruction.  Foley properly draining. No fever or signs of sepsis. Pt will be discharged home on oral abx.    At this time there does not appear to be any evidence of an acute emergency medical condition and the patient appears stable for discharge with appropriate outpatient follow up.   Celene Kras, MD 09/19/11 [phone]

## 2011-09-19 NOTE — ED Provider Notes (Signed)
History     CSN: [id number]  Arrival date & time 09/19/11  4098   First MD Initiated Contact with Patient 09/19/11 0747      No chief complaint on file.   (Consider location/radiation/quality/duration/timing/severity/associated sxs/prior treatment) HPI Comments: 61 yo male with history of CKD and neurogenic bladder with indwelling urethral catheter presents with urethral/bladder pain, worsening since last evening. He noticed some mild irritation sensation in the evening, that worsened and feels like cramping in the bladder. Made sleeping difficult, and called EMS this morning due to increasing pain. He also noted the bag was somewhat displaced upon awakening this morning, but catheter remained in place. He last changed catheter one week ago, using same bag for longer than one month.   He denies any hematuria, penile swelling, discharge, fevers, emesis, back pain.    Past Medical History  Diagnosis Date  . Arthritis   . Emphysema   . Fibromyalgia   . Multiple sclerosis     with optic neuritis  . Narcolepsy   . Scoliosis   . Hypothyroidism   . HLD (hyperlipidemia)   . Anemia of chronic disease   . Thrombocytopenia   . Anxiety and depression   . Pulmonary embolism   . COPD (chronic obstructive pulmonary disease)   . ESRD (end stage renal disease)   . Insomnia   . Carpal tunnel syndrome   . DDD (degenerative disc disease)   . GERD (gastroesophageal reflux disease)   . BPH (benign prostatic hypertrophy)   . History of gout   . Neurogenic bladder   . Optic neuritis   . Steroid-induced psychosis   . History of epistaxis   . History of MRSA infection   . Endocarditis   . Supraspinatus tendon tear     Past Surgical History  Procedure Date  . Meniscectomy   . Carpal tunnel release     left withradius fracture repair  . Nasal cautery   . Skin graft   . Colonoscopy 03/15/2011    diminutve adenoma    Family History  Problem Relation Age of Onset  . Alzheimer's disease  Mother   . Colon cancer Neg Hx     History  Substance Use Topics  . Smoking status: Current Everyday Smoker -- 1.0 packs/day for 40 years    Types: Cigarettes  . Smokeless tobacco: Never Used   Comment: Counseling sheet to quit smoking given in exam room   . Alcohol Use: No      Review of Systems  Constitutional: Negative for fever, activity change and appetite change.  Respiratory: Negative for cough, chest tightness and shortness of breath.   Cardiovascular: Negative for chest pain, palpitations and leg swelling.  Genitourinary: Negative for hematuria, flank pain, scrotal swelling and testicular pain.  Neurological: Negative for dizziness, weakness and headaches.  Psychiatric/Behavioral: Negative for agitation.    Allergies  Sertraline hcl and Ultram  Home Medications   Current Outpatient Rx  Name Route Sig Dispense Refill  . ALBUTEROL SULFATE HFA 108 (90 BASE) MCG/ACT IN AERS Inhalation Inhale 1 puff into the lungs every 4 (four) hours as needed. For breathing     . ALPRAZOLAM 0.5 MG PO TABS  Take 1/2-1 tablet by mouth three times daily as needed for anxiety     . ARMODAFINIL 250 MG PO TABS Oral Take 125 mg by mouth daily. for narcolepsy and multiple sclerosis     . ASCORBIC ACID 1000 MG PO TABS Oral Take 1,000 mg by mouth 3 (three)  times daily.      . ASPIRIN 81 MG PO TABS Oral Take 81 mg by mouth daily.      Marland Kitchen CARVEDILOL 12.5 MG PO TABS Oral Take 12.5 mg by mouth 2 (two) times daily with a meal.      . DONEPEZIL HCL 5 MG PO TBDP Oral Take 5 mg by mouth daily. For memory     . DULOXETINE HCL 60 MG PO CPEP Oral Take 60 mg by mouth 2 (two) times daily.      Marland Kitchen ESZOPICLONE 2 MG PO TABS Oral Take 2 mg by mouth at bedtime. As needed for sleep      . MORPHINE SULFATE 60 MG PO TB12 Oral Take 60 mg by mouth every 8 (eight) hours as needed. for pain      . MULTIVITAMIN/IRON PO TABS  Take 1-2 tablets by mouth daily     . OXYCODONE HCL 10 MG PO TABS  Take 1 tablet up to six times  daily to control pain as needed     . POLYETHYLENE GLYCOL 3350 PO POWD  Take 1 teaspoon by mouth daily as needed     . SIMVASTATIN 20 MG PO TABS Oral Take 20 mg by mouth daily. For cholesterol     . VITAMIN E 1000 UNITS PO CAPS Oral Take 1,000 Units by mouth daily.      Marland Kitchen ZOLPIDEM TARTRATE ER 6.25 MG PO TBCR Oral Take 6.25 mg by mouth at bedtime as needed. For rest       BP 124/72  Pulse 51  Temp 98 F (36.7 C)  Resp 22  Ht 5\' 10"  (1.778 m)  Wt 160 lb (72.576 kg)  BMI 22.96 kg/m2  SpO2 98%  Physical Exam  Vitals reviewed. Constitutional: He is oriented to person, place, and time. He appears well-developed and well-nourished.       Appears uncomfortable.  HENT:  Head: Normocephalic and atraumatic.  Mouth/Throat: Oropharynx is clear and moist.  Eyes: EOM are normal. Pupils are equal, round, and reactive to light.  Neck: Neck supple.  Cardiovascular: Normal rate, regular rhythm and normal heart sounds.        Frequent pvc.  Pulmonary/Chest: Effort normal. No respiratory distress.  Abdominal: Soft. He exhibits no distension and no mass. There is tenderness. There is no rebound and no guarding.       BS++ Mild-mod suprapubic discomfort.  Genitourinary: Penis normal.  Musculoskeletal: He exhibits no edema and no tenderness.  Neurological: He is alert and oriented to person, place, and time. No cranial nerve deficit. Coordination normal.  Skin: No rash noted.  Psychiatric: He has a normal mood and affect.    ED Course  Procedures (including critical care time)  Labs Reviewed  URINALYSIS, ROUTINE W REFLEX MICROSCOPIC - Abnormal; Notable for the following:    APPearance CLOUDY (*)    Hgb urine dipstick SMALL (*)    Nitrite POSITIVE (*)    Leukocytes, UA MODERATE (*)    All other components within normal limits  BASIC METABOLIC PANEL - Abnormal; Notable for the following:    Glucose, Bld 109 (*)    GFR calc non Af Amer 67 (*)    GFR calc Af Amer 78 (*)    All other  components within normal limits  URINE MICROSCOPIC-ADD ON - Abnormal; Notable for the following:    Bacteria, UA MANY (*)    All other components within normal limits  CBC  MAGNESIUM  URINE CULTURE  GRAM  STAIN   Lab Results  Component Value Date   WBC 9.6 09/19/2011   HGB 14.2 09/19/2011   HCT 41.7 09/19/2011   MCV 95.0 09/19/2011   PLT 354 09/19/2011   Lab Results  Component Value Date   NA 136 09/19/2011   K 4.3 09/19/2011   CL 100 09/19/2011   CO2 24 09/19/2011   Lab Results  Component Value Date   CREATININE 1.16 09/19/2011   ] No results found.   No diagnosis found.   Date: 09/19/2011  Rate: 95  Rhythm: normal sinus rhythm and premature ventricular contractions (PVC)  QRS Axis: right  Intervals: normal  ST/T Wave abnormalities: normal  Conduction Disutrbances:none  Narrative Interpretation:   Old EKG Reviewed: more PVCs.    MDM  61 yo male with CKD and neurogenic bladder presents with one day worsening urethral and bladder pain. Check UA and urine culture to assess for infection vs urethral irritation from catheter. CBC and BMET with bigeminy will check electrolytes. Pain control.    10:30 AM Pain not adequately controlled with morphine. Patient complains of sever bladder spasms. Will try low dose ditropan, though likely to take some time. UA confirms UTI, complicated by indwelling catheter. Foley has been changed and culture sent. Gram stain ordered, but lab states this will take several hours to process due to a delay, which is unfortunate. F/u micro studies as available.   Will treat empirically with ciprofloxacin in absence of systemic signs, continue with pain control. Electrolytes and renal function stable.   2:49 PM Pain now improved, will discharge with ciprofloxacin x 10 days. Advised to f/u with PCP in next few days and to return to ED for worsening, avoid over-treatment with pyridium to mask signs of worsening infection. Patient agrees.      Durwin Reges, MD 09/19/11 [phone]

## 2011-09-19 NOTE — ED Notes (Signed)
HQI:ON62<XB> Expected date:09/19/11<BR> Expected time: 7:20 AM<BR> Means of arrival:Ambulance<BR> Comments:<BR> Foley cath problems, pain at meatus

## 2011-09-19 NOTE — ED Notes (Signed)
Per ems:pt is from home c/o cath pain. Pt states he thinks while he was sleep he pulled the cath and now is having buring and pain from bladder all the way down. Pt took 0.5 xanax but has not has any relief. No other c/o. Cath is still intact

## 2011-09-21 LAB — GRAM STAIN: Special Requests: NORMAL

## 2011-09-21 LAB — URINE CULTURE
Colony Count: >=100000
Culture  Setup Time: 201306101124

## 2011-09-22 NOTE — ED Notes (Signed)
+   Urine Patient treated with Cipro-sensitive to same-chart appended per protocol MD. 

## 2011-10-23 IMAGING — CR DG CHEST 1V PORT
1 series · 1 of 1 positions shown · non-contrast
Comparison: Portable chest x-rays 08/21/2009 and 08/20/2009.

CLINICAL DATA: Anemia.  Atrial fibrillation.  Follow up pulmonary
edema.

PORTABLE CHEST - 1 VIEW [DATE]/[id number] hours:

[view not recorded]
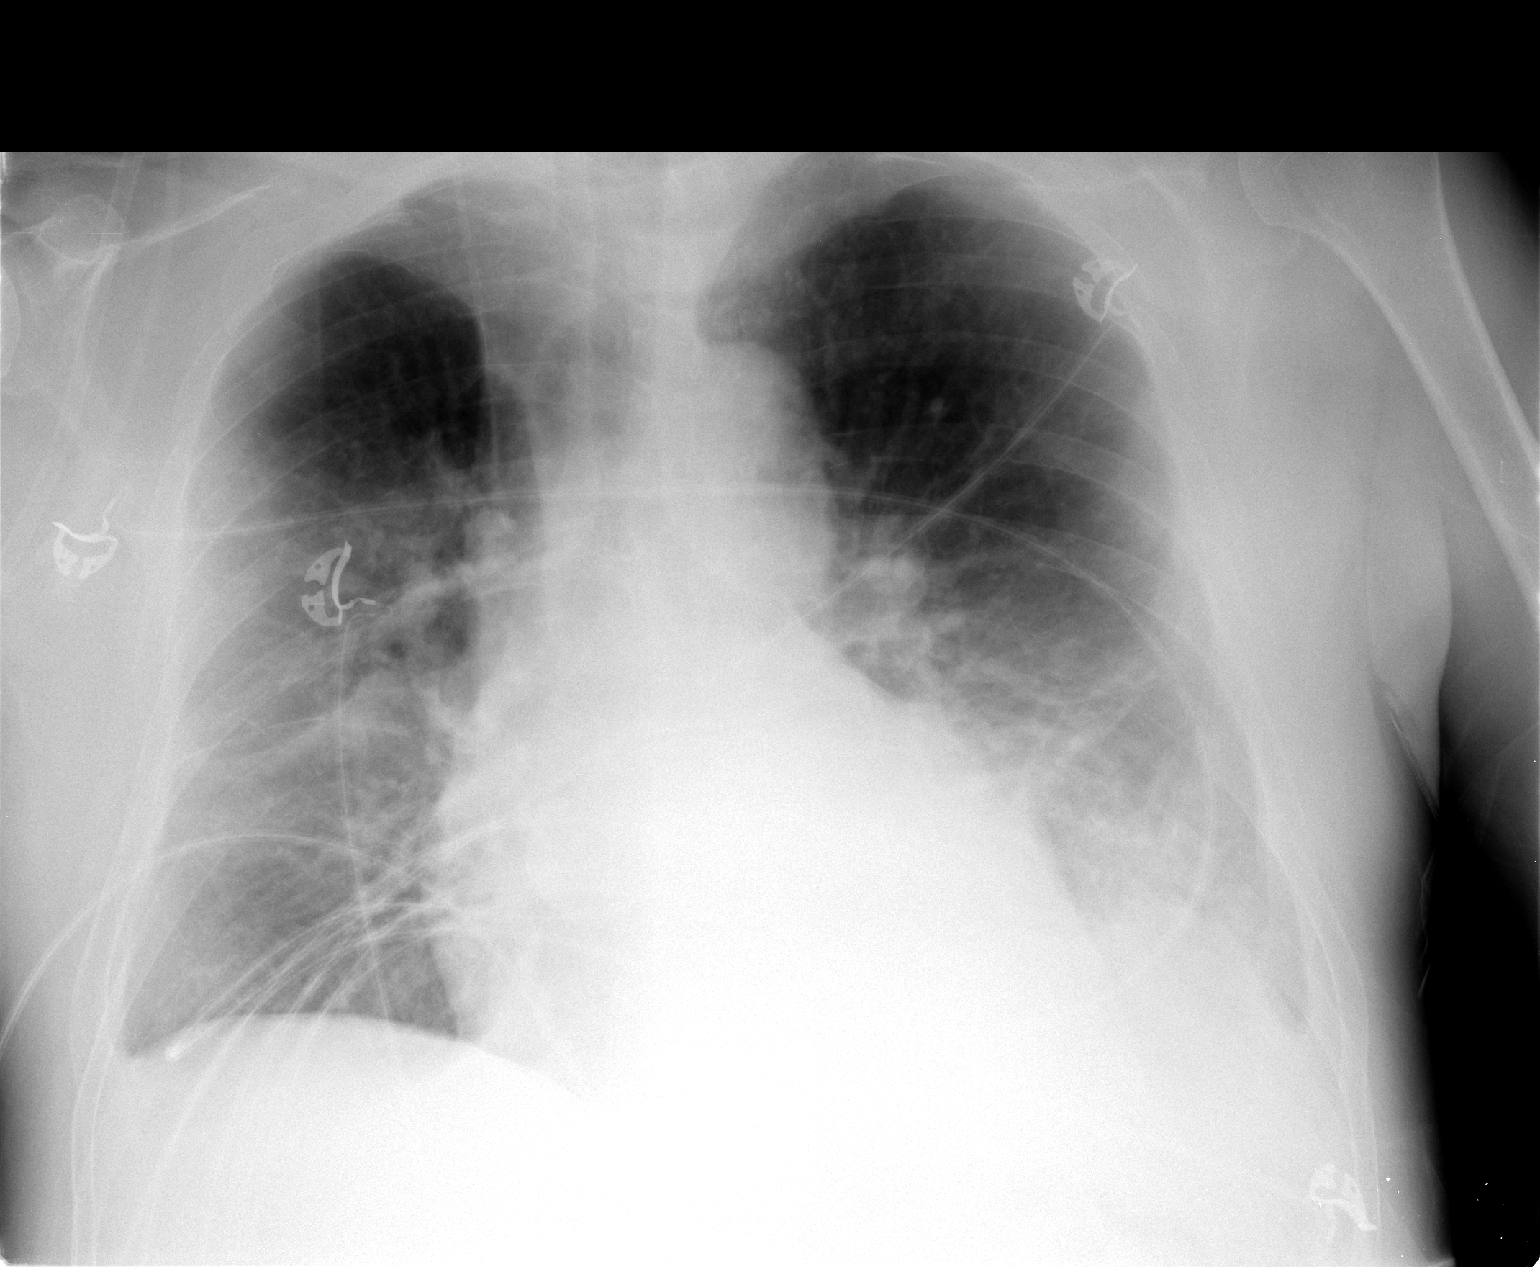

[1 of 1 positions shown; findings below may reference images not displayed]

FINDINGS: Heart enlarged but stable.  Resolution of pulmonary
venous hypertension and interstitial pulmonary edema.  Worsening
consolidation in the left lower lobe and enlarging left pleural
effusion.  Stable small right pleural effusion.  Less fluid in the
minor fissure on the right.  Right jugular central venous catheter
tip in the SVC.
IMPRESSION: Resolution of pulmonary edema.  Worsening left lower lobe
atelectasis and/or pneumonia and enlarging left pleural effusion.
Stable small right pleural effusion.

## 2011-10-26 IMAGING — US US RENAL
1 series · 14 of 15 positions shown · non-contrast
Comparison: None

CLINICAL DATA: Anemia, renal failure, pre biopsy

RENAL/URINARY TRACT ULTRASOUND COMPLETE

[Series 1: us renal · 0.27mm/px · 14 of 15 slices shown]
[im 1/15]
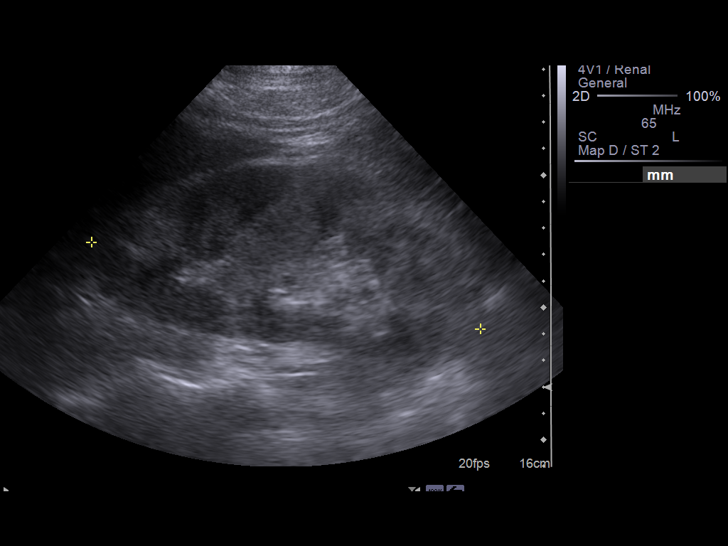
[im 2/15]
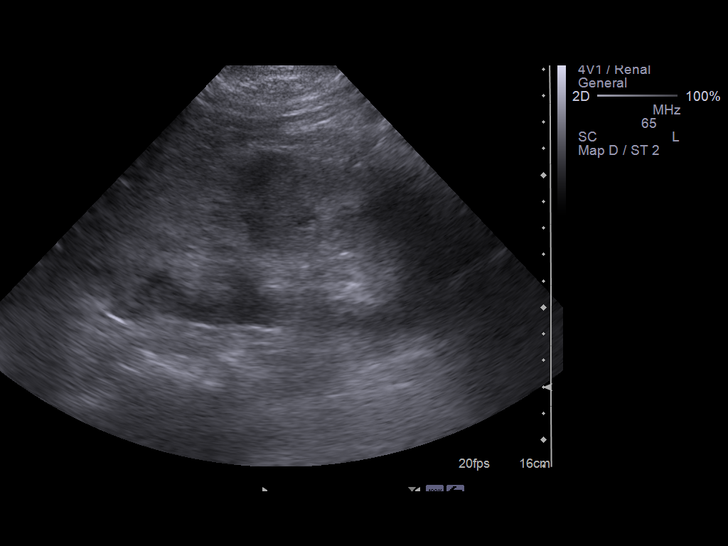
[im 3/15]
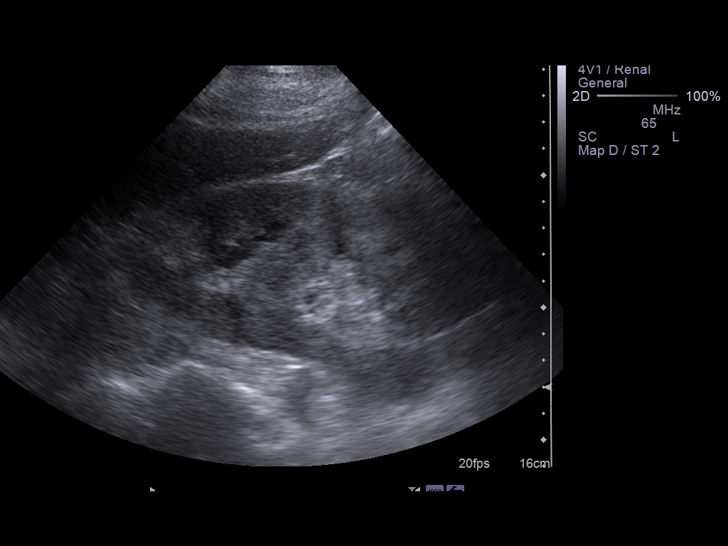
[im 4/15]
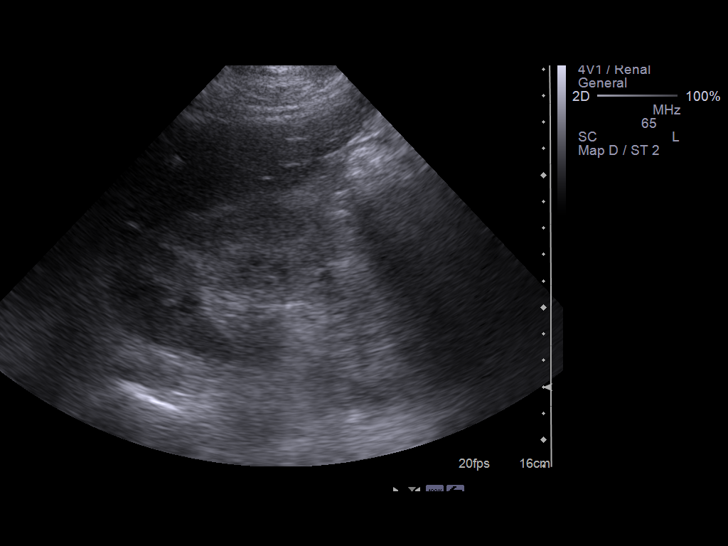
[im 5/15]
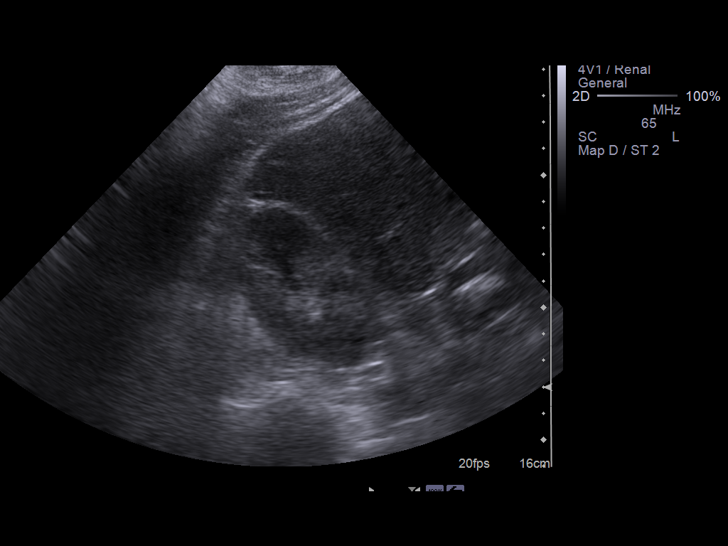
[im 6/15]
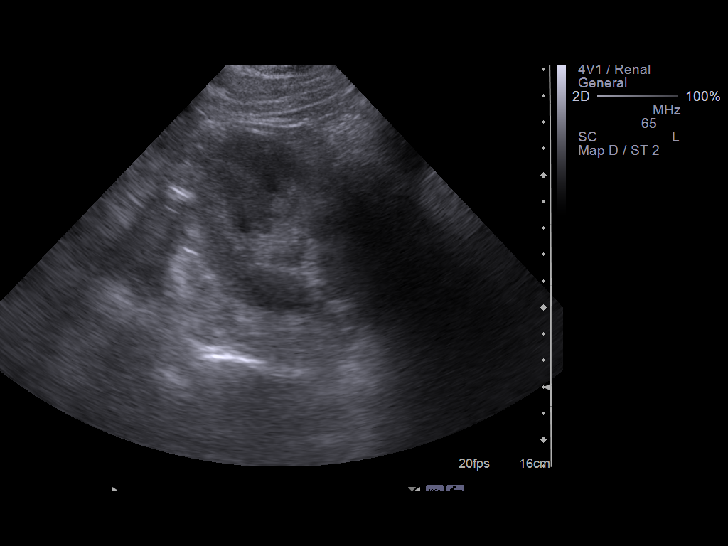
[im 7/15]
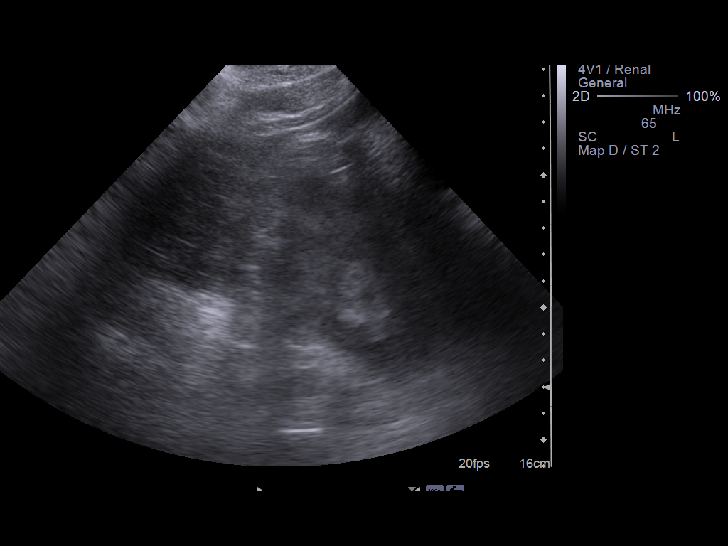
[im 9/15]
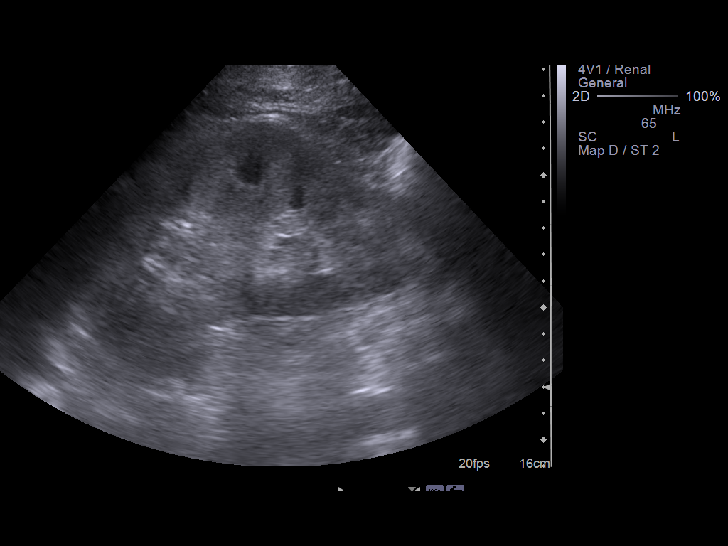
[im 10/15]
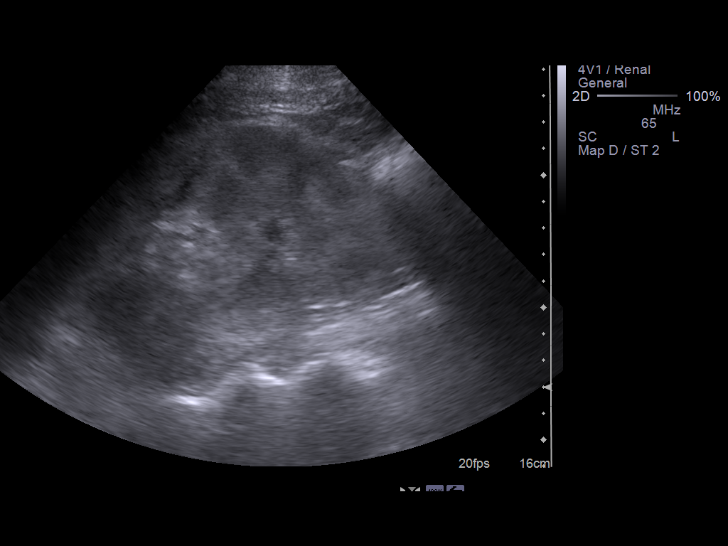
[im 11/15]
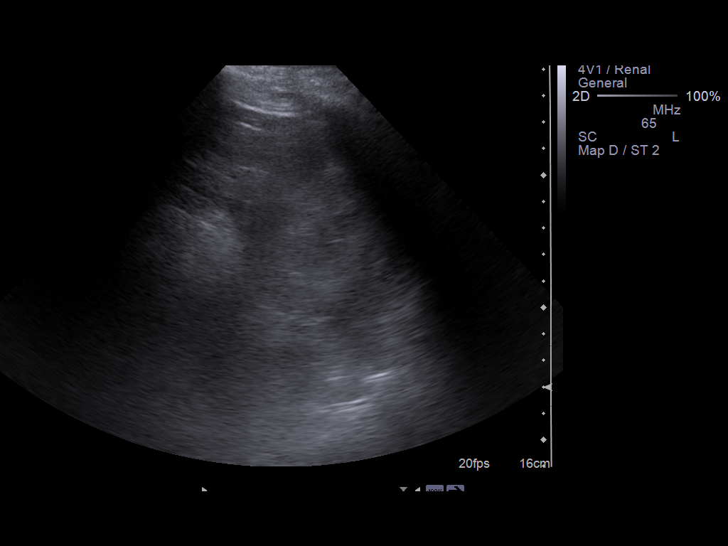
[im 12/15]
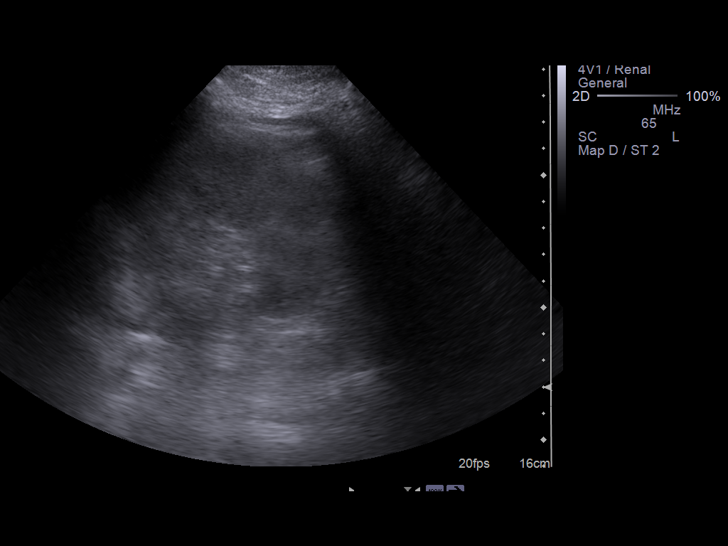
[im 13/15]
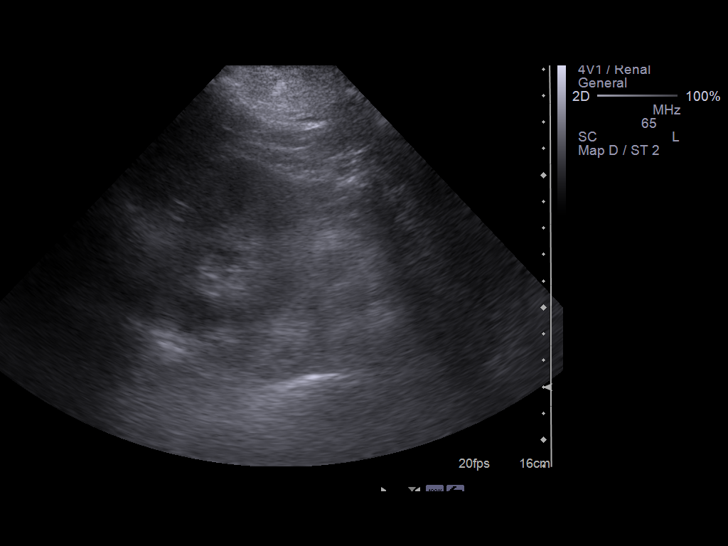
[im 14/15]
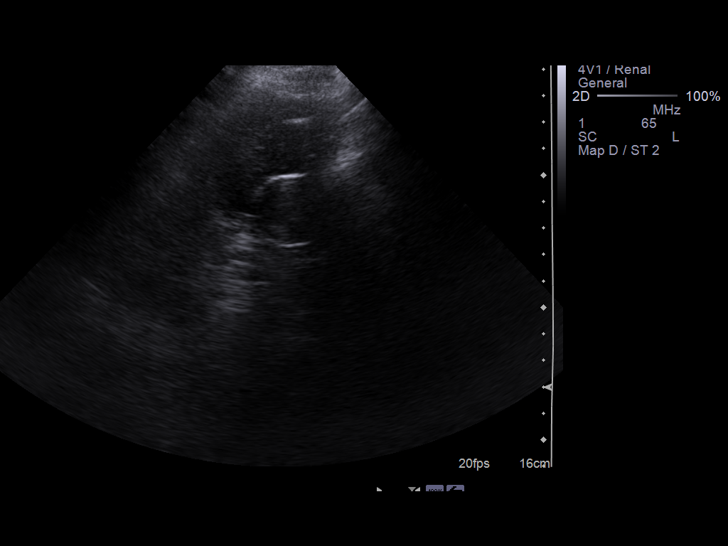
[im 15/15]
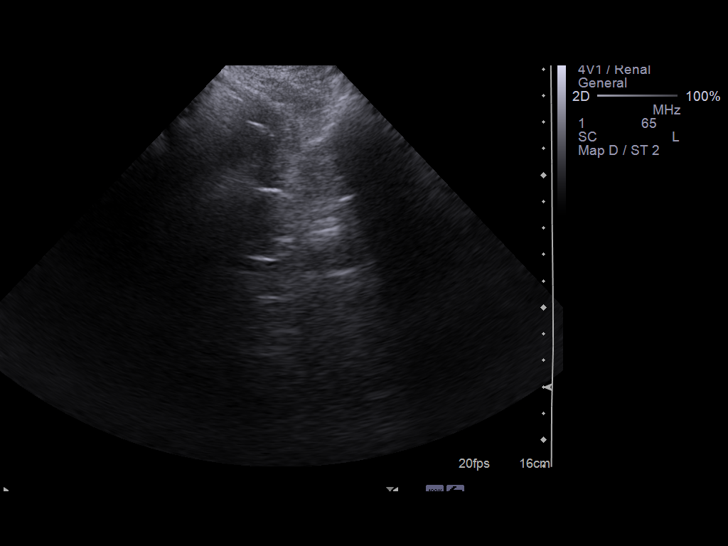

[14 of 15 positions shown; findings below may reference images not displayed]

FINDINGS: Right Kidney:  15.1 cm length.  Diffusely increased renal cortical
echogenicity.  Normal cortical thickness.  No gross mass,
hydronephrosis or shadowing calcification.  No definite perinephric
fluid.

Left Kidney:  14.0 cm length.  Diffusely increased renal cortical
echogenicity.  Left kidney less well visualized due to body habitus
and position.  Cortical thickness appears preserved.  No gross mass
or hydronephrosis.

Bladder:  Decompressed by Foley catheter, inadequately evaluated.
IMPRESSION: Enlarged kidneys with echogenic cortices, compatible with medical
renal disease.

## 2011-11-10 DIAGNOSIS — I2510 Atherosclerotic heart disease of native coronary artery without angina pectoris: Secondary | ICD-10-CM

## 2011-11-10 HISTORY — DX: Atherosclerotic heart disease of native coronary artery without angina pectoris: I25.10

## 2011-11-16 ENCOUNTER — Encounter (HOSPITAL_COMMUNITY): Payer: Self-pay | Admitting: Pharmacist

## 2011-11-22 ENCOUNTER — Encounter (HOSPITAL_COMMUNITY): Payer: Self-pay | Admitting: General Practice

## 2011-11-22 ENCOUNTER — Ambulatory Visit (HOSPITAL_COMMUNITY)
Admission: RE | Admit: 2011-11-22 | Discharge: 2011-11-23 | Disposition: A | Discharge status: Home / Self Care | Payer: Medicare Other | Source: Ambulatory Visit | Attending: Cardiology | Admitting: Cardiology

## 2011-11-22 ENCOUNTER — Encounter (HOSPITAL_COMMUNITY)
Admission: RE | Disposition: A | Discharge status: Home / Self Care | Payer: Self-pay | Source: Ambulatory Visit | Attending: Cardiology

## 2011-11-22 DIAGNOSIS — I10 Essential (primary) hypertension: Secondary | ICD-10-CM | POA: Insufficient documentation

## 2011-11-22 DIAGNOSIS — I2510 Atherosclerotic heart disease of native coronary artery without angina pectoris: Secondary | ICD-10-CM | POA: Insufficient documentation

## 2011-11-22 DIAGNOSIS — J449 Chronic obstructive pulmonary disease, unspecified: Secondary | ICD-10-CM | POA: Insufficient documentation

## 2011-11-22 DIAGNOSIS — E785 Hyperlipidemia, unspecified: Secondary | ICD-10-CM | POA: Insufficient documentation

## 2011-11-22 DIAGNOSIS — J4489 Other specified chronic obstructive pulmonary disease: Secondary | ICD-10-CM | POA: Insufficient documentation

## 2011-11-22 DIAGNOSIS — F17200 Nicotine dependence, unspecified, uncomplicated: Secondary | ICD-10-CM | POA: Insufficient documentation

## 2011-11-22 HISTORY — DX: Dyspnea, unspecified: R06.00

## 2011-11-22 HISTORY — DX: Personal history of other diseases of the digestive system: Z87.19

## 2011-11-22 HISTORY — PX: CORONARY ANGIOPLASTY WITH STENT PLACEMENT: SHX49

## 2011-11-22 HISTORY — DX: Acute myocardial infarction, unspecified: I21.9

## 2011-11-22 HISTORY — DX: Other forms of dyspnea: R06.09

## 2011-11-22 HISTORY — DX: Cardiac murmur, unspecified: R01.1

## 2011-11-22 HISTORY — PX: LEFT HEART CATHETERIZATION WITH CORONARY ANGIOGRAM: SHX5451

## 2011-11-22 HISTORY — DX: Personal history of other medical treatment: Z92.89

## 2011-11-22 HISTORY — DX: Anxiety disorder, unspecified: F41.9

## 2011-11-22 HISTORY — PX: CORONARY ANGIOPLASTY: SHX604

## 2011-11-22 HISTORY — PX: PERCUTANEOUS CORONARY STENT INTERVENTION (PCI-S): SHX5485

## 2011-11-22 LAB — POCT ACTIVATED CLOTTING TIME: Activated Clotting Time: 449 seconds

## 2011-11-22 SURGERY — LEFT HEART CATHETERIZATION WITH CORONARY ANGIOGRAM
Anesthesia: LOCAL

## 2011-11-22 MED ORDER — MIDAZOLAM HCL 2 MG/2ML IJ SOLN
INTRAMUSCULAR | Status: AC
  Filled 2011-11-22: qty 2

## 2011-11-22 MED ORDER — DULOXETINE HCL 20 MG PO CPEP
20.0000 mg | ORAL_CAPSULE | Freq: Two times a day (BID) | ORAL | Status: DC
  Administered 2011-11-22 – 2011-11-23 (×2): 20 mg via ORAL
  Filled 2011-11-22 (×3): qty 1

## 2011-11-22 MED ORDER — MORPHINE SULFATE ER 60 MG PO TBCR
60.0000 mg | EXTENDED_RELEASE_TABLET | Freq: Three times a day (TID) | ORAL | Status: DC
  Administered 2011-11-22 (×2): 60 mg via ORAL
  Filled 2011-11-22 (×2): qty 1

## 2011-11-22 MED ORDER — LEVALBUTEROL TARTRATE 45 MCG/ACT IN AERO
2.0000 | INHALATION_SPRAY | Freq: Two times a day (BID) | RESPIRATORY_TRACT | Status: DC | PRN
  Filled 2011-11-22: qty 15

## 2011-11-22 MED ORDER — ASPIRIN 81 MG PO CHEW
81.0000 mg | CHEWABLE_TABLET | Freq: Every day | ORAL | Status: DC
  Administered 2011-11-23: 12:00:00 81 mg via ORAL
  Filled 2011-11-22: qty 1

## 2011-11-22 MED ORDER — ASPIRIN 81 MG PO CHEW
324.0000 mg | CHEWABLE_TABLET | ORAL | Status: AC
  Administered 2011-11-22: 324 mg via ORAL

## 2011-11-22 MED ORDER — SODIUM CHLORIDE 0.9 % IV SOLN
1.7500 mg/kg/h | INTRAVENOUS | Status: DC
  Filled 2011-11-22: qty 250

## 2011-11-22 MED ORDER — SODIUM CHLORIDE 0.9 % IV SOLN: 250.0000 mL | INTRAVENOUS | Status: DC | PRN

## 2011-11-22 MED ORDER — HEPARIN SODIUM (PORCINE) 1000 UNIT/ML IJ SOLN
INTRAMUSCULAR | Status: AC
  Filled 2011-11-22: qty 1

## 2011-11-22 MED ORDER — DONEPEZIL HCL 5 MG PO TABS
5.0000 mg | ORAL_TABLET | Freq: Every day | ORAL | Status: DC
  Administered 2011-11-23: 12:00:00 5 mg via ORAL
  Filled 2011-11-22: qty 1

## 2011-11-22 MED ORDER — MIDAZOLAM HCL 2 MG/2ML IJ SOLN
INTRAMUSCULAR | Status: AC
  Filled 2011-11-22: qty 4

## 2011-11-22 MED ORDER — FENTANYL CITRATE 0.05 MG/ML IJ SOLN
INTRAMUSCULAR | Status: AC
  Filled 2011-11-22: qty 2

## 2011-11-22 MED ORDER — SODIUM CHLORIDE 0.9 % IV SOLN
INTRAVENOUS | Status: DC
  Administered 2011-11-22: 11:00:00 via INTRAVENOUS

## 2011-11-22 MED ORDER — ADULT MULTIVITAMIN W/MINERALS CH
1.0000 | ORAL_TABLET | Freq: Every day | ORAL | Status: DC
  Filled 2011-11-22 (×2): qty 1

## 2011-11-22 MED ORDER — TICAGRELOR 90 MG PO TABS
ORAL_TABLET | ORAL | Status: AC
  Filled 2011-11-22: qty 2

## 2011-11-22 MED ORDER — MODAFINIL 200 MG PO TABS: 100.0000 mg | ORAL_TABLET | Freq: Every day | ORAL | Status: DC

## 2011-11-22 MED ORDER — CARVEDILOL 12.5 MG PO TABS
12.5000 mg | ORAL_TABLET | Freq: Two times a day (BID) | ORAL | Status: DC
  Administered 2011-11-22 – 2011-11-23 (×2): 12.5 mg via ORAL
  Filled 2011-11-22 (×4): qty 1

## 2011-11-22 MED ORDER — SODIUM CHLORIDE 0.9 % IV SOLN
1.0000 mL/kg/h | INTRAVENOUS | Status: AC
  Administered 2011-11-22: 15:00:00 1 mL/kg/h via INTRAVENOUS

## 2011-11-22 MED ORDER — ASPIRIN 81 MG PO CHEW: 81.0000 mg | CHEWABLE_TABLET | Freq: Every day | ORAL | Status: DC

## 2011-11-22 MED ORDER — ALPRAZOLAM 0.25 MG PO TABS
0.5000 mg | ORAL_TABLET | Freq: Every day | ORAL | Status: DC | PRN
  Administered 2011-11-23: 12:00:00 0.5 mg via ORAL
  Filled 2011-11-22 (×2): qty 1

## 2011-11-22 MED ORDER — FLUTICASONE-SALMETEROL 250-50 MCG/DOSE IN AEPB
1.0000 | INHALATION_SPRAY | Freq: Two times a day (BID) | RESPIRATORY_TRACT | Status: DC
  Administered 2011-11-23: 11:00:00 1 via RESPIRATORY_TRACT
  Filled 2011-11-22: qty 14

## 2011-11-22 MED ORDER — ONDANSETRON HCL 4 MG/2ML IJ SOLN
4.0000 mg | Freq: Four times a day (QID) | INTRAMUSCULAR | Status: DC | PRN
  Filled 2011-11-22: qty 2

## 2011-11-22 MED ORDER — ASPIRIN 81 MG PO CHEW
CHEWABLE_TABLET | ORAL | Status: AC
  Filled 2011-11-22: qty 4

## 2011-11-22 MED ORDER — ZOLPIDEM TARTRATE 5 MG PO TABS
10.0000 mg | ORAL_TABLET | Freq: Every evening | ORAL | Status: DC | PRN
  Administered 2011-11-22: 5 mg via ORAL
  Filled 2011-11-22: qty 1

## 2011-11-22 MED ORDER — TICAGRELOR 90 MG PO TABS
90.0000 mg | ORAL_TABLET | Freq: Two times a day (BID) | ORAL | Status: DC
  Administered 2011-11-22 – 2011-11-23 (×2): 90 mg via ORAL
  Filled 2011-11-22 (×3): qty 1

## 2011-11-22 MED ORDER — SODIUM CHLORIDE 0.9 % IJ SOLN: 3.0000 mL | INTRAMUSCULAR | Status: DC | PRN

## 2011-11-22 MED ORDER — HYDROMORPHONE HCL PF 2 MG/ML IJ SOLN
INTRAMUSCULAR | Status: AC
  Filled 2011-11-22: qty 1

## 2011-11-22 MED ORDER — ACETAMINOPHEN 325 MG PO TABS: 650.0000 mg | ORAL_TABLET | ORAL | Status: DC | PRN

## 2011-11-22 MED ORDER — ARMODAFINIL 250 MG PO TABS: 125.0000 mg | ORAL_TABLET | Freq: Every day | ORAL | Status: DC

## 2011-11-22 MED ORDER — ALBUTEROL SULFATE HFA 108 (90 BASE) MCG/ACT IN AERS
2.0000 | INHALATION_SPRAY | RESPIRATORY_TRACT | Status: DC | PRN
  Filled 2011-11-22: qty 6.7

## 2011-11-22 MED ORDER — SODIUM CHLORIDE 0.9 % IJ SOLN: 3.0000 mL | Freq: Two times a day (BID) | INTRAMUSCULAR | Status: DC

## 2011-11-22 MED ORDER — BIVALIRUDIN 250 MG IV SOLR
INTRAVENOUS | Status: AC
  Filled 2011-11-22: qty 250

## 2011-11-22 MED ORDER — OXYCODONE HCL 10 MG PO TABS: 10.0000 mg | ORAL_TABLET | ORAL | Status: DC | PRN

## 2011-11-22 MED ORDER — SIMVASTATIN 20 MG PO TABS
20.0000 mg | ORAL_TABLET | Freq: Every day | ORAL | Status: DC
  Filled 2011-11-22: qty 1

## 2011-11-22 MED ORDER — VERAPAMIL HCL 2.5 MG/ML IV SOLN
INTRAVENOUS | Status: AC
  Filled 2011-11-22: qty 2

## 2011-11-22 MED ORDER — OXYCODONE HCL 5 MG PO TABS
10.0000 mg | ORAL_TABLET | ORAL | Status: DC | PRN
  Administered 2011-11-22 – 2011-11-23 (×5): 10 mg via ORAL
  Filled 2011-11-22 (×5): qty 2

## 2011-11-22 NOTE — Progress Notes (Signed)
Assessed for utilization review 

## 2011-11-22 NOTE — H&P (Signed)
  Please see my paper chart.

## 2011-11-22 NOTE — Interval H&P Note (Signed)
History and Physical Interval Note:  11/22/2011 12:47 PM  Dale Shields  has presented today for surgery, with the diagnosis of Chest pain  The various methods of treatment have been discussed with the patient and family. After consideration of risks, benefits and other options for treatment, the patient has consented to  Procedure(s) (LRB): LEFT HEART CATHETERIZATION WITH CORONARY ANGIOGRAM (N/A) and possible angioplasty  as a surgical intervention .  The patient's history has been reviewed, patient examined, no change in status, stable for surgery.  I have reviewed the patient's chart and labs.  Questions were answered to the patient's satisfaction.     Pamella Pert

## 2011-11-22 NOTE — CV Procedure (Signed)
Procedure performed:  Left heart catheterization including hemodynamic monitoring of the left ventricle, LV gram,  selective right and left coronary arteriography.  Indication patient is a 61 year-old man with history of hypertension,  hyperlipidemia, who presents with chest pain suggestive of angina pectoris. Patient has  had non invasive testing which was abnormal revealing severe anterior and anteroapical ischemia.  Hence is brought to the cardiac catheterization lab to evaluate her coronary anatomy for definitive diagnosis of CAD.  Hemodynamic data:  Left ventricular pressure was 91/4 with LVEDP of 11 mm mercury. Aortic pressure was 94/47 with a mean of 64 mm mercury. No pressure gradient across the AV. Left ventricle: Performed in the RAO projection revealed LVEF of 60%. There was no MR. no Wall motion abnormality.  Right coronary artery: The vessel is Dominant. Midsegment of the RCA showed a 40-50% diffuse luminal irregularity. Distal segment of the right coronary artery prior to the bifurcation of PDA and PL showed 90% stenosis.  Left main coronary artery is large and normal. It is very short and bifurcates immediately into LAD and circumflex coronary artery  Circumflex coronary artery: A large vessel giving origin to a large obtuse marginal 1. Circumflex coronary artery has mild diffuse luminal irregularity. It also gives origin to a moderate sized OM 2.  LAD:  LAD gives origin to a large diagonal-1.  LAD has 80-90% stenosis in the proximal segment. There was also mild luminal irregularities in the mid to distal segment. Ostium of the D1 had a 90% stenosis.  Interventional data: Successful PTCA and stenting of the proximal LAD after performing angina score with a 3.0 x 6 mm angiosculpt balloon at a peak of 18 atmospheric pressure followed by stent implantation with a 3.5 x 12 mm Xience Xpedition drug-eluting stent. \ Successful PTCA and scoring balloon angioplasty with the same balloon  into the ostium of the D1 with reduction of stenosis from 90% to less than 20%.  Successful PTCA and scoring balloon angioplasty of the same scoring balloon into the distal right coronary artery with reduction of stenosis from 90% to 0%.  TIMI-3 to TIMI-3 flow maintained throughout the procedure and percent of the procedure. There was no immediate complications.   Technique: Under sterile precautions using a 6 French right radial  arterial access, a 6 French sheath was introduced into the right radial artery. A 5 [place] Tig 4 catheter was advanced into the ascending aorta selective  right coronary artery and left coronary artery was cannulated and angiography was performed in multiple views. The catheter was pulled back Out of the body over exchange length J-wire.  Same Catheter was used to perform LV gram which was performed in LAO projection.  Catheter exchanged out of the body over J-Wire. NO immediate complications noted. Patient tolerated the procedure well.     Technique of intervention:  Using a 6 [place] XB LAD 3.5  guide catheter theLM  coronary  was selected and cannulated. Using Angiomax for anticoagulation, I utilized a Couger  guidewire and across the LAD coronary artery without any difficulty. I placed the tip of the wire into the distal  coronary artery. Angiography was performed.   Then I utilized a 3.0 x 6 mm angiosculpt balloon, I performed balloon scoring angioplasty at 11 pressure x 1 then 18 for 45 seconds each.  I proceeded with implantation of a 3.5x12 mm Xience Xpedition drug-eluting stent into the proximal LAD coronary artery. The stent was deployed at 12 atmospheric pressure for 45  seconds.  Attention was directed towards the D1 branch of the LAD. The wire was re\re advanced into the D1 and the same angiosculpt balloon was utilized to perform balloon antiplastic to the T1 segment of the LAD at atmospheric pressures for 90 seconds. Following this angiography revealed  excellent results without any edge dissection or thrombus. Intraoral nitroglycerin also administered.   Attention was then directed towards the right coronary artery. After withdrawing the guidewire and the guide cath out of the body over the J-wire, a 6 [place] JR 4 guide catheter was utilized to engage the right coronary artery. Using the same guidewire Lahey advanced into the distal right coronary artery and  Using theangiosculpt balloon 3 mm x 6 mm balloon, angioplasty was performed with multiple inflations at 4 atmospheric pressures x3 and 2 atmospheric pressures x1 for 50 seconds each. The coronary nitroglycerin was administered and angiography was repeated. Excellent brisk flow was evident without any edge dissection or thrombus. TIMI-3 flow was maintained.   Post-balloon angioplasty results were excellent with 0% residual stenoses and TIMI-3 flow was maintained. There was no evidence of edge dissection. The guidewire was withdrawn out of the body and the guide catheter was engaged and pulled out of the body over the J-wire the was no immediate complication. Patient tolerated the procedure well.  Disposition: Patient will be discharged in am unless complications with out-patient follow up. Will need Dual antiplatelet therapy with Brilinta and ASA 81 mg for at least one year.

## 2011-11-23 LAB — CBC
MCV: 96.3 fL (ref 78.0–100.0)
Platelets: 217 10*3/uL (ref 150–400)
RBC: 3.76 MIL/uL — ABNORMAL LOW (ref 4.22–5.81)
RDW: 13.7 % (ref 11.5–15.5)
WBC: 10.8 10*3/uL — ABNORMAL HIGH (ref 4.0–10.5)

## 2011-11-23 LAB — BASIC METABOLIC PANEL
Chloride: 103 mEq/L (ref 96–112)
Creatinine, Ser: 0.95 mg/dL (ref 0.50–1.35)
GFR calc Af Amer: >90 mL/min (ref >90)
Sodium: 139 mEq/L (ref 135–145)

## 2011-11-23 MED ORDER — TICAGRELOR 90 MG PO TABS: 90.0000 mg | ORAL_TABLET | Freq: Two times a day (BID) | ORAL | Status: DC

## 2011-11-23 MED ORDER — MORPHINE SULFATE ER 60 MG PO TBCR
60.0000 mg | EXTENDED_RELEASE_TABLET | Freq: Three times a day (TID) | ORAL | Status: DC
  Administered 2011-11-23: 09:00:00 60 mg via ORAL
  Filled 2011-11-23: qty 1

## 2011-11-23 MED FILL — Dextrose Inj 5%: INTRAVENOUS | Qty: 1000 | Status: AC

## 2011-11-23 NOTE — Progress Notes (Signed)
[id number] Began education with pt. Discussed smoking cessation and gave pt smoking cessation sheets. Reviewed brilinta and aspirin use as well as NTG use. When I started to discuss diet pt stated this was too much for him right now and could I come back. He mentioned he might need Asst. Living. Told pt that when he was ready to walk and wanted me to come back that RN could page me. Tried to give emotional support. Breakfast set up. Hayzlee Mcsorley DunlapRN

## 2011-11-23 NOTE — Discharge Summary (Signed)
Physician Discharge Summary  Patient ID: Dale Shields MRN: [id number] DOB/AGE: 06/12/1950 61 y.o.  Admit date: 11/22/2011 Discharge date: 11/23/2011  Primary Discharge Diagnosis Coronary artery disease. PTCA and  drug-eluting stenting of the proximal LAD with implantation of a 3.5 x 12 mm Xience Xpedition  PTCA and scoring balloon angioplasty with 3.0 x 6 mm Angiosculpt  into the ostium of the D1 with reduction of stenosis from 90% to less than 20%.  Successful PTCA and scoring balloon angioplasty of the same scoring balloon into the distal right coronary artery with reduction of stenosis from 90% to 0%.  Secondary Discharge Diagnosis Hypertension Hyperlipidemia Degenerative joint disease Continued tobacco use and COPD  Significant Diagnostic Studies: Cardiac catheterization and angioplasty as dictated above.  Consults:   Hospital Course: Patient tolerated the procedure well. Patient requested pain medications through the night. He states he had chest discomfort last night and shortness of breath. But he also states that this is probably related to the fact that he has not taken his medications.  Telemetry and EKG did not reveal anything significant to suggest ischemia. He did have frequent PVCs that were unchanged from prior EKG. Patient will need BRILINTA for at least a period of a year along with aspirin 81 mg by mouth daily. This has been clearly explained to the patient.   Discharge Exam: Blood pressure 140/44, pulse 65, temperature 98 F (36.7 C), temperature source Oral, resp. rate 15, height 5\' 10"  (1.778 m), weight 78.1 kg (172 lb 2.9 oz), SpO2 97.00%.     General appearance: alert, cooperative, appears older than stated age and no distress Chest wall: no tenderness Cardio: regular rate and rhythm, S1, S2 normal, no murmur, click, rub or gallop Extremities: extremities normal, atraumatic, no cyanosis or edema right radial arterial access site was stable without any  hematoma and good arterial pulse.  Labs:   Lab Results  Component Value Date   WBC 10.8* 11/23/2011   HGB 11.9* 11/23/2011   HCT 36.2* 11/23/2011   MCV 96.3 11/23/2011   PLT 217 11/23/2011    Lab 11/23/11 0522  NA 139  K 4.0  CL 103  CO2 28  BUN 15  CREATININE 0.95  CALCIUM 8.8  PROT --  BILITOT --  ALKPHOS --  ALT --  AST --  GLUCOSE 121*   Lipid Panel     Component Value Date/Time   CHOL 111 09/29/2010 1814   TRIG 130 09/29/2010 1814   HDL 17* 09/29/2010 1814   CHOLHDL 6.5 09/29/2010 1814   VLDL 26 09/29/2010 1814   LDLCALC 68 09/29/2010 1814    EKG: Sinus rhythm with first degree AV block, left atrial abnormality, PVC. No evidence of ischemia.  FOLLOW UP PLANS AND APPOINTMENTS  Medication List  As of 11/23/2011  8:06 AM   TAKE these medications         albuterol 108 (90 BASE) MCG/ACT inhaler   Commonly known as: PROVENTIL HFA;VENTOLIN HFA   Inhale 2 puffs into the lungs every 4 (four) hours as needed. For shortness of breath      ALPRAZolam 0.5 MG tablet   Commonly known as: XANAX   Take 0.5 mg by mouth daily as needed. For anxiety      ascorbic acid 1000 MG tablet   Commonly known as: VITAMIN C   Take 1,000 mg by mouth 3 (three) times daily.      aspirin 81 MG chewable tablet   Chew 81 mg by mouth daily.  carvedilol 12.5 MG tablet   Commonly known as: COREG   Take 12.5 mg by mouth 2 (two) times daily with a meal.      donepezil 5 MG tablet   Commonly known as: ARICEPT   Take 5 mg by mouth daily.      DULoxetine 20 MG capsule   Commonly known as: CYMBALTA   Take 20 mg by mouth 2 (two) times daily.      Fluticasone-Salmeterol 250-50 MCG/DOSE Aepb   Commonly known as: ADVAIR   Inhale 1 puff into the lungs every 12 (twelve) hours.      levalbuterol 45 MCG/ACT inhaler   Commonly known as: XOPENEX HFA   Inhale 2 puffs into the lungs 2 (two) times daily as needed. For shortness of breath      morphine 60 MG 12 hr tablet   Commonly known as: MS  CONTIN   Take 60 mg by mouth 3 (three) times daily.      multivitamin with minerals Tabs   Take 1 tablet by mouth daily.      NUVIGIL 250 MG tablet   Generic drug: Armodafinil   Take 125 mg by mouth daily. for narcolepsy and multiple sclerosis      Oxycodone HCl 10 MG Tabs   Take 10 mg by mouth every 4 (four) hours as needed. For pain      simvastatin 20 MG tablet   Commonly known as: ZOCOR   Take 20 mg by mouth daily.      Ticagrelor 90 MG Tabs tablet   Commonly known as: BRILINTA   Take 1 tablet (90 mg total) by mouth 2 (two) times daily.      vitamin E 1000 UNIT capsule   Take 1,000 Units by mouth 2 (two) times daily.      zolpidem 10 MG tablet   Commonly known as: AMBIEN   Take 10 mg by mouth at bedtime as needed. For sleep           Follow-up Information    Follow up with Pamella Pert, MD in 2 weeks.   Contact information:   [address]  [place] [address] [phone]           Pamella Pert, MD 11/23/2011, 8:06 AM

## 2011-11-23 NOTE — Progress Notes (Signed)
CARDIAC REHAB PHASE I   PRE:  Rate/Rhythm: 64SR  BP:  Supine:   Sitting: 126/58  Standing:    SaO2: 100%3L  MODE:  Ambulation: just outside of door  ft   POST:  Rate/Rhythem: 81SR  BP:  Supine:   Sitting: 159/55  Standing:    SaO2: 99-100%RA 1000-1033  Notified by Rn that pt willing to try to walk now. Pt said he was SOB earlier and had to be put on oxygen. Walked pt on RA while I watched sats continuously. Pt walked outside of door and said he had to go back. C/o SOB and feeling funny. Denied CP. C/o dizziness. Sats maintained 99-100%RA whole time. BP 159/55 once back to bed. Put back on oxygen. Did not give ex ed or discuss CRP2 due to poor tolerance of activity. Emotional support given. Discussed  Response to ex with pt's RN. Requesting breakthrough med for pain.  Duanne Limerick

## 2011-11-23 NOTE — Care Management Note (Signed)
    Page 1 of 2   11/23/2011     1:56:19 PM   CARE MANAGEMENT NOTE 11/23/2011  Patient:  Dale Shields, Dale Shields   Account Number:  [id number]  Date Initiated:  11/23/2011  Documentation initiated by:  Tera Mater  Subjective/Objective Assessment:   61yo male admitted with Chest Pain     Action/Plan:   Discharge planing   Anticipated DC Date:  11/23/2011   Anticipated DC Plan:  HOME W HOME HEALTH SERVICES  In-house referral  Clinical Social Worker      DC Associate Professor  CM consult  Medication Assistance      [facility] Choice  HOME HEALTH   Choice offered to / List presented to:  C-1 Patient        HH arranged  HH-1 RN  HH-4 NURSE'S AIDE  HH-6 SOCIAL WORKER      Status of service:  Completed, signed off Medicare Important Message given?   (If response is "NO", the following Medicare IM given date fields will be blank) Date Medicare IM given:   Date Additional Medicare IM given:    Discharge Disposition:  HOME W HOME HEALTH SERVICES  Per UR Regulation:  Reviewed for med. necessity/level of care/duration of stay  If discussed at Long Length of Stay Meetings, dates discussed:    Comments:  11/23/11 1230 Tera Mater, RN, BSN NCM [phone] In to speak with pt. about [facility] services.  Pt. stated that he had HH in past with [facility] and was satisfied with their services.  TC to [facility] [phone]) to give referral for [facility] RN, [employer] aide, and CSW to assist with possible Assisted Living facilities.  Faxed facesheet, HH order, and discharge summary to [phone]).  In addition, checked on copay for Brilinta and pt. does not have copay for this medication.  Pt. fills meds at [facility].  Pt. will discharge home today.

## 2011-11-26 ENCOUNTER — Encounter (HOSPITAL_COMMUNITY): Payer: Self-pay | Admitting: *Deleted

## 2011-11-26 ENCOUNTER — Emergency Department (HOSPITAL_COMMUNITY)
Admission: EM | Admit: 2011-11-26 | Discharge: 2011-11-26 | Disposition: A | Discharge status: Home / Self Care | Payer: Medicare Other | Attending: Emergency Medicine | Admitting: Emergency Medicine

## 2011-11-26 DIAGNOSIS — I252 Old myocardial infarction: Secondary | ICD-10-CM | POA: Insufficient documentation

## 2011-11-26 DIAGNOSIS — N400 Enlarged prostate without lower urinary tract symptoms: Secondary | ICD-10-CM | POA: Insufficient documentation

## 2011-11-26 DIAGNOSIS — N390 Urinary tract infection, site not specified: Secondary | ICD-10-CM | POA: Insufficient documentation

## 2011-11-26 DIAGNOSIS — E039 Hypothyroidism, unspecified: Secondary | ICD-10-CM | POA: Insufficient documentation

## 2011-11-26 DIAGNOSIS — K219 Gastro-esophageal reflux disease without esophagitis: Secondary | ICD-10-CM | POA: Insufficient documentation

## 2011-11-26 DIAGNOSIS — F17200 Nicotine dependence, unspecified, uncomplicated: Secondary | ICD-10-CM | POA: Insufficient documentation

## 2011-11-26 DIAGNOSIS — G35 Multiple sclerosis: Secondary | ICD-10-CM | POA: Insufficient documentation

## 2011-11-26 DIAGNOSIS — Z79899 Other long term (current) drug therapy: Secondary | ICD-10-CM | POA: Insufficient documentation

## 2011-11-26 DIAGNOSIS — Z7982 Long term (current) use of aspirin: Secondary | ICD-10-CM | POA: Insufficient documentation

## 2011-11-26 DIAGNOSIS — J438 Other emphysema: Secondary | ICD-10-CM | POA: Insufficient documentation

## 2011-11-26 DIAGNOSIS — F329 Major depressive disorder, single episode, unspecified: Secondary | ICD-10-CM | POA: Insufficient documentation

## 2011-11-26 DIAGNOSIS — Z886 Allergy status to analgesic agent status: Secondary | ICD-10-CM | POA: Insufficient documentation

## 2011-11-26 DIAGNOSIS — E785 Hyperlipidemia, unspecified: Secondary | ICD-10-CM | POA: Insufficient documentation

## 2011-11-26 DIAGNOSIS — F411 Generalized anxiety disorder: Secondary | ICD-10-CM | POA: Insufficient documentation

## 2011-11-26 DIAGNOSIS — F3289 Other specified depressive episodes: Secondary | ICD-10-CM | POA: Insufficient documentation

## 2011-11-26 LAB — URINALYSIS, ROUTINE W REFLEX MICROSCOPIC
Bilirubin Urine: NEGATIVE
Ketones, ur: NEGATIVE mg/dL
Nitrite: POSITIVE — AB
Specific Gravity, Urine: 1.016 (ref 1.005–1.030)
Urobilinogen, UA: 1 mg/dL (ref 0.0–1.0)

## 2011-11-26 LAB — URINE MICROSCOPIC-ADD ON

## 2011-11-26 LAB — BASIC METABOLIC PANEL
CO2: 24 mEq/L (ref 19–32)
Chloride: 106 mEq/L (ref 96–112)
Sodium: 142 mEq/L (ref 135–145)

## 2011-11-26 MED ORDER — CEPHALEXIN 500 MG PO CAPS: 500.0000 mg | ORAL_CAPSULE | Freq: Four times a day (QID) | ORAL | Status: AC

## 2011-11-26 MED ORDER — MORPHINE SULFATE 4 MG/ML IJ SOLN
4.0000 mg | Freq: Once | INTRAMUSCULAR | Status: AC
  Administered 2011-11-26: 4 mg via INTRAVENOUS
  Filled 2011-11-26: qty 1

## 2011-11-26 NOTE — ED Provider Notes (Addendum)
History     CSN: [id number]  Arrival date & time 11/26/11  1015   First MD Initiated Contact with Patient 11/26/11 1049      Chief Complaint  Patient presents with  . Groin Pain    (Consider location/radiation/quality/duration/timing/severity/associated sxs/prior treatment) Patient is a 61 y.o. male presenting with groin pain. The history is provided by the patient.  Groin Pain   patient complains of pain at the tip of his penis where the Foley catheter is inserted. Note some burning as well. Similar symptoms in the past associated with urinary tract infections. Denies any fever or back pain. Does have a chronic indwelling Foley due to neurogenic bladder.  Past Medical History  Diagnosis Date  . Emphysema   . Fibromyalgia   . Multiple sclerosis     with optic neuritis  . Narcolepsy   . Scoliosis   . Hypothyroidism   . HLD (hyperlipidemia)   . Anemia of chronic disease   . Thrombocytopenia   . Pulmonary embolism     "suspected; never confirmed"  . COPD (chronic obstructive pulmonary disease)   . Insomnia   . Carpal tunnel syndrome   . GERD (gastroesophageal reflux disease)   . BPH (benign prostatic hypertrophy)   . History of gout   . Neurogenic bladder   . Optic neuritis   . History of epistaxis   . History of MRSA infection   . Endocarditis   . Supraspinatus tendon tear   . Heart murmur   . Myocardial infarction     "according to stress test; that's news to me"  . Exertional dyspnea   . Sleep apnea     "tried CPAP; lost more sleep w/use"  . History of blood transfusion     "gave me too much coumadin and heparin once"  . H/O hiatal hernia   . Arthritis     "everywhere"  . DDD (degenerative disc disease)   . Anxiety   . Anxiety and depression   . Steroid-induced psychosis   . ESRD (end stage renal disease) 2011    "on dialysis 3 times over 2 years"    Past Surgical History  Procedure Date  . Carpal tunnel release ~ 2010    left with radius fracture  repair  . Nasal cautery   . Skin graft 04/2009    right thigh to right "wrist all the way to my shoulder; necrotizing fasciatitis"  . Colonoscopy 03/15/2011    diminutve adenoma  . Coronary angioplasty 11/22/2011    "twice"  . Coronary angioplasty with stent placement 11/22/2011    "1"  . Knee arthroscopy w/ meniscal repair 1990's    right    Family History  Problem Relation Age of Onset  . Alzheimer's disease Mother   . Colon cancer Neg Hx     History  Substance Use Topics  . Smoking status: Current Everyday Smoker -- 1.0 packs/day for 40 years    Types: Cigarettes  . Smokeless tobacco: Never Used   Comment: Counseling sheet to quit smoking given in exam room   . Alcohol Use: Yes     11/22/2011 "like crazy til 1988; nothing since"      Review of Systems  All other systems reviewed and are negative.    Allergies  Ultram  Home Medications   Current Outpatient Rx  Name Route Sig Dispense Refill  . ALBUTEROL SULFATE HFA 108 (90 BASE) MCG/ACT IN AERS Inhalation Inhale 2 puffs into the lungs every 4 (four) hours  as needed. For shortness of breath    . ALPRAZOLAM 0.5 MG PO TABS Oral Take 0.5 mg by mouth daily as needed. For anxiety    . ARMODAFINIL 250 MG PO TABS Oral Take 125 mg by mouth daily. for narcolepsy and multiple sclerosis    . ASCORBIC ACID 1000 MG PO TABS Oral Take 1,000 mg by mouth 3 (three) times daily.     . ASPIRIN 81 MG PO CHEW Oral Chew 81 mg by mouth daily.    Marland Kitchen CARVEDILOL 12.5 MG PO TABS Oral Take 12.5 mg by mouth 2 (two) times daily with a meal.     . DONEPEZIL HCL 5 MG PO TABS Oral Take 5 mg by mouth daily.    . DULOXETINE HCL 20 MG PO CPEP Oral Take 20 mg by mouth 2 (two) times daily.    Marland Kitchen FLUTICASONE-SALMETEROL 250-50 MCG/DOSE IN AEPB Inhalation Inhale 1 puff into the lungs every 12 (twelve) hours.    Marland Kitchen LEVALBUTEROL TARTRATE 45 MCG/ACT IN AERO Inhalation Inhale 2 puffs into the lungs 2 (two) times daily as needed. For shortness of breath    .  MORPHINE SULFATE ER 60 MG PO TBCR Oral Take 60 mg by mouth 3 (three) times daily.     . ADULT MULTIVITAMIN W/MINERALS CH Oral Take 1 tablet by mouth daily.    . OXYCODONE HCL 10 MG PO TABS Oral Take 10 mg by mouth every 4 (four) hours as needed. For pain    . SIMVASTATIN 20 MG PO TABS Oral Take 20 mg by mouth daily.     Marland Kitchen TICAGRELOR 90 MG PO TABS Oral Take 1 tablet (90 mg total) by mouth 2 (two) times daily. 60 tablet 0  . VITAMIN E 1000 UNITS PO CAPS Oral Take 1,000 Units by mouth 2 (two) times daily.     Marland Kitchen ZOLPIDEM TARTRATE 10 MG PO TABS Oral Take 10 mg by mouth at bedtime as needed. For sleep      BP 140/88  Pulse 102  Temp 97.8 F (36.6 C)  Resp 18  SpO2 98%  Physical Exam  Nursing note and vitals reviewed. Constitutional: He is oriented to person, place, and time. He appears well-developed and well-nourished.  Non-toxic appearance. No distress.  HENT:  Head: Normocephalic and atraumatic.  Eyes: Conjunctivae, EOM and lids are normal. Pupils are equal, round, and reactive to light.  Neck: Normal range of motion. Neck supple. No tracheal deviation present. No mass present.  Cardiovascular: Normal rate, regular rhythm and normal heart sounds.  Exam reveals no gallop.   No murmur heard. Pulmonary/Chest: Effort normal and breath sounds normal. No stridor. No respiratory distress. He has no decreased breath sounds. He has no wheezes. He has no rhonchi. He has no rales.  Abdominal: Soft. Normal appearance and bowel sounds are normal. He exhibits no distension. There is no tenderness. There is no rebound and no CVA tenderness.  Genitourinary: Testes normal and penis normal.       Foley catheter in place. No erythema or drainage at the distal urethra it can be appreciated  Musculoskeletal: Normal range of motion. He exhibits no edema and no tenderness.  Neurological: He is alert and oriented to person, place, and time. He has normal strength. No cranial nerve deficit or sensory deficit. GCS  eye subscore is 4. GCS verbal subscore is 5. GCS motor subscore is 6.  Skin: Skin is warm and dry. No abrasion and no rash noted.  Psychiatric: He has a normal  mood and affect. His speech is normal and behavior is normal.    ED Course  Procedures (including critical care time)  Labs Reviewed - No data to display No results found.   No diagnosis found.    MDM  Patient to be treated for urinary tract infection   Date: 01/18/2012  Rate: 114  Rhythm: atrial flutter  QRS Axis: normal  Intervals: normal  ST/T Wave abnormalities: nonspecific ST changes  Conduction Disutrbances:none  Narrative Interpretation:   Old EKG Reviewed: none available        Toy Baker, MD 11/26/11 1436  Toy Baker, MD 01/18/12 0127

## 2011-11-26 NOTE — ED Notes (Signed)
C/o of groin pain and bilateral hip for x 2 hrs. Has a foley cath. That get change out monthly. This cath. Has been in place for  X 3 weeks. bp 134/82, 88, hx. Of afib. Recent cardiac stent x 3 days ago. Cath. Lab did a radial procedure.

## 2011-11-26 NOTE — ED Notes (Signed)
Not enough urine in leg bag at this time.

## 2011-11-27 NOTE — Progress Notes (Signed)
11/27/2011 1000 Received call from Unit RN, Synetta Fail on [phone]. States pt was calling stating he could not reach [employer]. Spoke to [employer], [employer] rep. States pt has refused HH RN to visit until Tues, 8/20. States she will follow up with pt and see if [facility] RN visit can be arranged today. Explained to rep that pt had a ED visit and was having complication related to foley. States pt having issues with privacy and refused his visit last week. States agency did follow up with his physician to make aware. Isidoro Donning RN CCM Case Mgmt phone [phone]

## 2011-11-28 LAB — URINE CULTURE

## 2011-12-22 ENCOUNTER — Other Ambulatory Visit: Payer: Self-pay | Admitting: Nephrology

## 2012-07-04 ENCOUNTER — Ambulatory Visit (INDEPENDENT_AMBULATORY_CARE_PROVIDER_SITE_OTHER): Payer: Medicare Other | Admitting: Orthopedic Surgery

## 2012-07-04 ENCOUNTER — Ambulatory Visit (INDEPENDENT_AMBULATORY_CARE_PROVIDER_SITE_OTHER): Payer: Self-pay

## 2012-07-04 VITALS — BP 112/62 | Ht 70.0 in | Wt 165.0 lb

## 2012-07-04 DIAGNOSIS — M25569 Pain in unspecified knee: Secondary | ICD-10-CM

## 2012-07-04 DIAGNOSIS — M25561 Pain in right knee: Secondary | ICD-10-CM

## 2012-07-04 NOTE — Patient Instructions (Signed)
Medically unsuitable for surgery   Medicate as needed for pain   Therapeutic exercises to mobilize for ambulation   If surgery is a strong consideration see tertiary care facility

## 2012-07-05 ENCOUNTER — Encounter: Payer: Self-pay | Admitting: Orthopedic Surgery

## 2012-07-05 NOTE — Progress Notes (Signed)
Patient ID: Dale Shields, male   DOB: 1950-10-04, 62 y.o.   MRN: [id number] Chief Complaint  Patient presents with  . Knee Pain    Right knee pain    This [age]-year-old male was recently for some time ago sent to a nursing facility for various medical problems he is concerned about his right knee he wishes to discuss right knee replacement his symptoms started in 2000 in his pain over that time until now has increased he complains of dull throbbing constant pain.  He complains of locking catching and swelling but he also complains of numbness and tingling in the limb. He says he has pain everywhere. He has multiple medical problems  He complains under review of systems of all systems being positive in his symptoms include weight loss and gain chills fatigue I pain recent heart surgery for chest pain shortness of breath heartburn frequency urgency joint pain joint swelling poor healing numbness tingling anxiety depression excessive thirst seasonal allergies and previous history of easy bleeding  He has degenerative disc disease scoliosis fibromyalgia emphysema multiple sclerosis arthritis, he had a history of transverse myelitis  He was treated for necrotizing fasciitis he had a skin graft he had a colonoscopy on several occasions he had right knee surgery back in 2000 had another orthopedic procedure in 2006 he is unclear on  Has a family history of arthritis cancer diabetes  He is single 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  BP 112/62  Ht 5\' 10"  (1.778 m)  Wt 165 lb (74.844 kg)  BMI 23.68 kg/m2 General appearance is normal, the patient is alert and oriented x3 with normal mood and affect. He comes in with a urinary bag attached to his leg and he comes in with a wheelchair he says he can walk with assistance grooming and wheelchair for quite some time  His right knee seems to have a  5-10 flexion contracture with 125 of knee flexion it is stable he has normal muscle tone skin intact there is no joint effusion but he does have medial tenderness  The cardiovascular exam without limits normal he reports normal sensation to soft touch and deep pressure. Normal reflex at the knee.  X-rays do show arthritis  Is not a surgical candidate at least not at this hospital and I doubt anywhere but he wishes to pursue this further and wants another opinion  So we will set this up or his request  Our diagnosis osteoarthritis right knee but not a surgical candidate

## 2013-02-07 ENCOUNTER — Encounter (HOSPITAL_COMMUNITY): Payer: Self-pay | Admitting: Emergency Medicine

## 2013-02-07 ENCOUNTER — Emergency Department (HOSPITAL_COMMUNITY)
Admission: EM | Admit: 2013-02-07 | Discharge: 2013-02-07 | Disposition: A | Discharge status: Home / Self Care | Payer: Medicare Other | Attending: Emergency Medicine | Admitting: Emergency Medicine

## 2013-02-07 DIAGNOSIS — Z8719 Personal history of other diseases of the digestive system: Secondary | ICD-10-CM | POA: Insufficient documentation

## 2013-02-07 DIAGNOSIS — E785 Hyperlipidemia, unspecified: Secondary | ICD-10-CM | POA: Insufficient documentation

## 2013-02-07 DIAGNOSIS — Z86711 Personal history of pulmonary embolism: Secondary | ICD-10-CM | POA: Insufficient documentation

## 2013-02-07 DIAGNOSIS — G4730 Sleep apnea, unspecified: Secondary | ICD-10-CM | POA: Insufficient documentation

## 2013-02-07 DIAGNOSIS — Z87828 Personal history of other (healed) physical injury and trauma: Secondary | ICD-10-CM | POA: Insufficient documentation

## 2013-02-07 DIAGNOSIS — Z8679 Personal history of other diseases of the circulatory system: Secondary | ICD-10-CM | POA: Insufficient documentation

## 2013-02-07 DIAGNOSIS — G4700 Insomnia, unspecified: Secondary | ICD-10-CM | POA: Insufficient documentation

## 2013-02-07 DIAGNOSIS — Z862 Personal history of diseases of the blood and blood-forming organs and certain disorders involving the immune mechanism: Secondary | ICD-10-CM | POA: Insufficient documentation

## 2013-02-07 DIAGNOSIS — R011 Cardiac murmur, unspecified: Secondary | ICD-10-CM | POA: Insufficient documentation

## 2013-02-07 DIAGNOSIS — F329 Major depressive disorder, single episode, unspecified: Secondary | ICD-10-CM | POA: Insufficient documentation

## 2013-02-07 DIAGNOSIS — J438 Other emphysema: Secondary | ICD-10-CM | POA: Insufficient documentation

## 2013-02-07 DIAGNOSIS — F3289 Other specified depressive episodes: Secondary | ICD-10-CM | POA: Insufficient documentation

## 2013-02-07 DIAGNOSIS — G8929 Other chronic pain: Secondary | ICD-10-CM | POA: Insufficient documentation

## 2013-02-07 DIAGNOSIS — IMO0002 Reserved for concepts with insufficient information to code with codable children: Secondary | ICD-10-CM | POA: Insufficient documentation

## 2013-02-07 DIAGNOSIS — Z8619 Personal history of other infectious and parasitic diseases: Secondary | ICD-10-CM | POA: Insufficient documentation

## 2013-02-07 DIAGNOSIS — F411 Generalized anxiety disorder: Secondary | ICD-10-CM | POA: Insufficient documentation

## 2013-02-07 DIAGNOSIS — Z7982 Long term (current) use of aspirin: Secondary | ICD-10-CM | POA: Insufficient documentation

## 2013-02-07 DIAGNOSIS — B029 Zoster without complications: Secondary | ICD-10-CM | POA: Insufficient documentation

## 2013-02-07 DIAGNOSIS — N186 End stage renal disease: Secondary | ICD-10-CM | POA: Insufficient documentation

## 2013-02-07 DIAGNOSIS — I252 Old myocardial infarction: Secondary | ICD-10-CM | POA: Insufficient documentation

## 2013-02-07 DIAGNOSIS — Z8614 Personal history of Methicillin resistant Staphylococcus aureus infection: Secondary | ICD-10-CM | POA: Insufficient documentation

## 2013-02-07 DIAGNOSIS — Z87448 Personal history of other diseases of urinary system: Secondary | ICD-10-CM | POA: Insufficient documentation

## 2013-02-07 DIAGNOSIS — Z9861 Coronary angioplasty status: Secondary | ICD-10-CM | POA: Insufficient documentation

## 2013-02-07 DIAGNOSIS — F17200 Nicotine dependence, unspecified, uncomplicated: Secondary | ICD-10-CM | POA: Insufficient documentation

## 2013-02-07 DIAGNOSIS — M129 Arthropathy, unspecified: Secondary | ICD-10-CM | POA: Insufficient documentation

## 2013-02-07 DIAGNOSIS — Z79899 Other long term (current) drug therapy: Secondary | ICD-10-CM | POA: Insufficient documentation

## 2013-02-07 MED ORDER — ACYCLOVIR 400 MG PO TABS: 800.0000 mg | ORAL_TABLET | Freq: Every day | ORAL | Status: DC

## 2013-02-07 MED ORDER — GABAPENTIN 100 MG PO CAPS: 100.0000 mg | ORAL_CAPSULE | Freq: Three times a day (TID) | ORAL | Status: DC

## 2013-02-07 NOTE — ED Notes (Signed)
Pt with whelp to posterior right ear with surrounding irritation to skin. Pt thinks he was bitten by an unknown insect while in [place] on 10/29. Pt recalls feeling what seemed to be a bug bite him while he was sitting outside. Pt is from [facility] and staff there states that pt took 8 days worth of his prescribed methadone in 3 days.

## 2013-02-07 NOTE — ED Notes (Signed)
[facility] called and it will be after 0800 before they can arrive.

## 2013-02-07 NOTE — ED Provider Notes (Signed)
Medical screening examination/treatment/procedure(s) were performed by non-physician practitioner and as supervising physician I was immediately available for consultation/collaboration.  EKG Interpretation   None        Kingson Lohmeyer K Nikola Blackston-Rasch, MD 02/07/13 2309 

## 2013-02-07 NOTE — ED Notes (Signed)
ROB PA  At bedside.

## 2013-02-07 NOTE — ED Notes (Signed)
Bed: EA54 Expected date:  Expected time:  Means of arrival:  Comments: EMS rash to behind ear

## 2013-02-07 NOTE — ED Provider Notes (Signed)
CSN: [id number]     Arrival date & time 02/07/13  0636 History   First MD Initiated Contact with Patient 02/07/13 [phone]     Chief Complaint  Patient presents with  . Insect Bite   (Consider location/radiation/quality/duration/timing/severity/associated sxs/prior Treatment) HPI Comments: Patient presents to the ED with a chief complaint of rash.  Patient states that he thinks he was bitten by a bug 2 days ago.  He denies seeing a bug.  He denies fever or chills.  He states that prior to the rash appearing, there was a burning sensation.  He reports having chicken pox as a child.  He currently stays at an assisted living facility.  He denies any other symptoms.  He is currently taking methadone for chronic pain.  The history is provided by the patient. No language interpreter was used.    Past Medical History  Diagnosis Date  . Emphysema   . Fibromyalgia   . Multiple sclerosis     with optic neuritis  . Narcolepsy   . Scoliosis   . Hypothyroidism   . HLD (hyperlipidemia)   . Anemia of chronic disease   . Thrombocytopenia   . Pulmonary embolism     "suspected; never confirmed"  . COPD (chronic obstructive pulmonary disease)   . Insomnia   . Carpal tunnel syndrome   . GERD (gastroesophageal reflux disease)   . BPH (benign prostatic hypertrophy)   . History of gout   . Neurogenic bladder   . Optic neuritis   . History of epistaxis   . History of MRSA infection   . Endocarditis   . Supraspinatus tendon tear   . Heart murmur   . Myocardial infarction     "according to stress test; that's news to me"  . Exertional dyspnea   . Sleep apnea     "tried CPAP; lost more sleep w/use"  . History of blood transfusion     "gave me too much coumadin and heparin once"  . H/O hiatal hernia   . Arthritis     "everywhere"  . DDD (degenerative disc disease)   . Anxiety   . Anxiety and depression   . Steroid-induced psychosis   . ESRD (end stage renal disease) 2011    "on dialysis 3  times over 2 years"   Past Surgical History  Procedure Laterality Date  . Carpal tunnel release  ~ 2010    left with radius fracture repair  . Nasal cautery    . Skin graft  04/2009    right thigh to right "wrist all the way to my shoulder; necrotizing fasciatitis"  . Colonoscopy  03/15/2011    diminutve adenoma  . Coronary angioplasty  11/22/2011    "twice"  . Coronary angioplasty with stent placement  11/22/2011    "1"  . Knee arthroscopy w/ meniscal repair  1990's    right   Family History  Problem Relation Age of Onset  . Alzheimer's disease Mother   . Colon cancer Neg Hx    History  Substance Use Topics  . Smoking status: Current Every Day Smoker -- 1.00 packs/day for 40 years    Types: Cigarettes  . Smokeless tobacco: Never Used     Comment: Counseling sheet to quit smoking given in exam room   . Alcohol Use: Yes     Comment: 11/22/2011 "like crazy til 1988; nothing since"    Review of Systems  All other systems reviewed and are negative.    Allergies  Ultram  Home Medications   Current Outpatient Rx  Name  Route  Sig  Dispense  Refill  . albuterol (PROVENTIL HFA;VENTOLIN HFA) 108 (90 BASE) MCG/ACT inhaler   Inhalation   Inhale 2 puffs into the lungs every 4 (four) hours as needed. For shortness of breath         . ALPRAZolam (XANAX) 0.5 MG tablet   Oral   Take 0.5 mg by mouth daily as needed. For anxiety         . Armodafinil (NUVIGIL) 250 MG tablet   Oral   Take 125 mg by mouth daily. for narcolepsy and multiple sclerosis         . ascorbic acid (VITAMIN C) 1000 MG tablet   Oral   Take 1,000 mg by mouth 3 (three) times daily.          Marland Kitchen aspirin 81 MG chewable tablet   Oral   Chew 81 mg by mouth daily.         . carvedilol (COREG) 12.5 MG tablet   Oral   Take 12.5 mg by mouth 2 (two) times daily with a meal.          . diclofenac sodium (VOLTAREN) 1 % GEL   Topical   Apply 1 application topically 3 (three) times daily as needed. For  joint pain         . donepezil (ARICEPT) 5 MG tablet   Oral   Take 5 mg by mouth daily.         . DULoxetine (CYMBALTA) 20 MG capsule   Oral   Take 20 mg by mouth 2 (two) times daily.         . Fluticasone-Salmeterol (ADVAIR) 250-50 MCG/DOSE AEPB   Inhalation   Inhale 1 puff into the lungs every 12 (twelve) hours.         Marland Kitchen levalbuterol (XOPENEX HFA) 45 MCG/ACT inhaler   Inhalation   Inhale 2 puffs into the lungs 2 (two) times daily as needed. For shortness of breath         . morphine (MS CONTIN) 60 MG 12 hr tablet   Oral   Take 60 mg by mouth 3 (three) times daily.          . Multiple Vitamin (MULTIVITAMIN WITH MINERALS) TABS   Oral   Take 1 tablet by mouth daily.         . nitroGLYCERIN (NITROSTAT) 0.4 MG SL tablet   Sublingual   Place 0.4 mg under the tongue every 5 (five) minutes as needed. For chest pain         . Oxycodone HCl 10 MG TABS   Oral   Take 10 mg by mouth every 4 (four) hours as needed. For breakthrough pain         . simvastatin (ZOCOR) 20 MG tablet   Oral   Take 20 mg by mouth daily.          . Ticagrelor (BRILINTA) 90 MG TABS tablet   Oral   Take 1 tablet (90 mg total) by mouth 2 (two) times daily.   60 tablet   0   . vitamin E 1000 UNIT capsule   Oral   Take 1,000 Units by mouth 2 (two) times daily.          Marland Kitchen zolpidem (AMBIEN) 10 MG tablet   Oral   Take 10 mg by mouth at bedtime as needed. For sleep  BP 151/76  Pulse 67  Temp(Src) 98.3 F (36.8 C) (Oral)  Resp 24  SpO2 95% Physical Exam  Nursing note and vitals reviewed. Constitutional: He is oriented to person, place, and time. He appears well-developed and well-nourished.  HENT:  Head: Normocephalic and atraumatic.  TMs are clear bilaterally  Eyes: Conjunctivae and EOM are normal. Pupils are equal, round, and reactive to light. Right eye exhibits no discharge. Left eye exhibits no discharge. No scleral icterus.  Neck: Normal range of motion.  Neck supple. No JVD present.  Cardiovascular: Normal rate, regular rhythm, normal heart sounds and intact distal pulses.  Exam reveals no gallop and no friction rub.   No murmur heard. Pulmonary/Chest: Effort normal and breath sounds normal. No respiratory distress. He has no wheezes. He has no rales. He exhibits no tenderness.  Abdominal: Soft. He exhibits no distension and no mass. There is no tenderness. There is no rebound and no guarding.  Musculoskeletal: Normal range of motion. He exhibits no edema and no tenderness.  Neurological: He is alert and oriented to person, place, and time. He has normal reflexes.  CN 3-12 intact  Skin: Skin is warm and dry.  Rash on right C3 dermatome, appears to be characteristic of shingles  Psychiatric: He has a normal mood and affect. His behavior is normal. Judgment and thought content normal.    ED Course  Procedures (including critical care time) Labs Review Labs Reviewed - No data to display Imaging Review No results found.  EKG Interpretation   None       MDM   1. Shingles     Patient with shingles.  Will treat with acyclovir and gabapentin.  Gave the patient return precautions and instructions to avoid high risk populations.  Patient understands and agrees with the plan.  Patient seen by and discussed with Dr. Nicanor Alcon.    Roxy Horseman, PA-C 02/07/13 [phone]

## 2013-02-07 NOTE — Discharge Instructions (Signed)
Shingles Shingles (herpes zoster) is an infection that is caused by the same virus that causes chickenpox (varicella). The infection causes a painful skin rash and fluid-filled blisters, which eventually break open, crust over, and heal. It may occur in any area of the body, but it usually affects only one side of the body or face. The pain of shingles usually lasts about 1 month. However, some people with shingles may develop long-term (chronic) pain in the affected area of the body. Shingles often occurs many years after the person had chickenpox. It is more common:  In people older than 50 years.  In people with weakened immune systems, such as those with HIV, AIDS, or cancer.  In people taking medicines that weaken the immune system, such as transplant medicines.  In people under great stress. CAUSES  Shingles is caused by the varicella zoster virus (VZV), which also causes chickenpox. After a person is infected with the virus, it can remain in the person's body for years in an inactive state (dormant). To cause shingles, the virus reactivates and breaks out as an infection in a nerve root. The virus can be spread from person to person (contagious) through contact with open blisters of the shingles rash. It will only spread to people who have not had chickenpox. When these people are exposed to the virus, they may develop chickenpox. They will not develop shingles. Once the blisters scab over, the person is no longer contagious and cannot spread the virus to others. SYMPTOMS  Shingles shows up in stages. The initial symptoms may be pain, itching, and tingling in an area of the skin. This pain is usually described as burning, stabbing, or throbbing.In a few days or weeks, a painful red rash will appear in the area where the pain, itching, and tingling were felt. The rash is usually on one side of the body in a band or belt-like pattern. Then, the rash usually turns into fluid-filled blisters. They  will scab over and dry up in approximately 2 3 weeks. Flu-like symptoms may also occur with the initial symptoms, the rash, or the blisters. These may include:  Fever.  Chills.  Headache.  Upset stomach. DIAGNOSIS  Your caregiver will perform a skin exam to diagnose shingles. Skin scrapings or fluid samples may also be taken from the blisters. This sample will be examined under a microscope or sent to a lab for further testing. TREATMENT  There is no specific cure for shingles. Your caregiver will likely prescribe medicines to help you manage the pain, recover faster, and avoid long-term problems. This may include antiviral drugs, anti-inflammatory drugs, and pain medicines. HOME CARE INSTRUCTIONS   Take a cool bath or apply cool compresses to the area of the rash or blisters as directed. This may help with the pain and itching.   Only take over-the-counter or prescription medicines as directed by your caregiver.   Rest as directed by your caregiver.  Keep your rash and blisters clean with mild soap and cool water or as directed by your caregiver.  Do not pick your blisters or scratch your rash. Apply an anti-itch cream or numbing creams to the affected area as directed by your caregiver.  Keep your shingles rash covered with a loose bandage (dressing).  Avoid skin contact with:  Babies.   Pregnant women.   Children with eczema.   Elderly people with transplants.   People with chronic illnesses, such as leukemia or AIDS.   Wear loose-fitting clothing to help ease   the pain of material rubbing against the rash.  Keep all follow-up appointments with your caregiver.If the area involved is on your face, you may receive a referral for follow-up to a specialist, such as an eye doctor (ophthalmologist) or an ear, nose, and throat (ENT) doctor. Keeping all follow-up appointments will help you avoid eye complications, chronic pain, or disability.  SEEK IMMEDIATE MEDICAL  CARE IF:   You have facial pain, pain around the eye area, or loss of feeling on one side of your face.  You have ear pain or ringing in your ear.  You have loss of taste.  Your pain is not relieved with prescribed medicines.   Your redness or swelling spreads.   You have more pain and swelling.  Your condition is worsening or has changed.   You have a feveror persistent symptoms for more than 2 3 days.  You have a fever and your symptoms suddenly get worse. MAKE SURE YOU:  Understand these instructions.  Will watch your condition.  Will get help right away if you are not doing well or get worse. Document Released: 03/28/2005 Document Revised: 12/21/2011 Document Reviewed: 11/10/2011 ExitCare Patient Information 2014 [employer].  

## 2013-02-07 NOTE — ED Notes (Signed)
Pt from [facility]. Verne Grain from [facility] called to  Say pt just returned from a 3 day leave of absence and reports that he has went through 8 days worth of pain medication in three days.

## 2013-03-29 ENCOUNTER — Telehealth: Payer: Self-pay | Admitting: Family Medicine

## 2013-03-29 ENCOUNTER — Ambulatory Visit (INDEPENDENT_AMBULATORY_CARE_PROVIDER_SITE_OTHER): Payer: Medicare Other | Admitting: Family Medicine

## 2013-03-29 ENCOUNTER — Encounter: Payer: Self-pay | Admitting: Family Medicine

## 2013-03-29 VITALS — BP 161/96 | HR 72 | Temp 97.6°F | Resp 22 | Ht 70.0 in | Wt 215.0 lb

## 2013-03-29 DIAGNOSIS — G894 Chronic pain syndrome: Secondary | ICD-10-CM

## 2013-03-29 DIAGNOSIS — G35 Multiple sclerosis: Secondary | ICD-10-CM

## 2013-03-29 DIAGNOSIS — L089 Local infection of the skin and subcutaneous tissue, unspecified: Secondary | ICD-10-CM

## 2013-03-29 DIAGNOSIS — L989 Disorder of the skin and subcutaneous tissue, unspecified: Secondary | ICD-10-CM

## 2013-03-29 MED ORDER — MUPIROCIN 2 % EX OINT: 1.0000 "application " | TOPICAL_OINTMENT | Freq: Three times a day (TID) | CUTANEOUS | Status: DC

## 2013-03-29 NOTE — Telephone Encounter (Signed)
Lindaann Pascal from [facility] would like to speak with you regarding this patient and his medical history and his former doctor.  She asked for you to call her at [phone] at the office or call her cell at [phone]

## 2013-03-29 NOTE — Progress Notes (Signed)
Pre visit review using our clinic review tool, if applicable. No additional management support is needed unless otherwise documented below in the visit note. 
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
## 2013-04-03 NOTE — Telephone Encounter (Signed)
Called the work# for [place] and she was not in. Called the cell # and a person who spoke only spanish answered, so I assume this was an incorrect number for her cell. Will retry her work# at earliest chance.

## 2013-04-10 NOTE — Telephone Encounter (Signed)
Spoke with Lindaann Pascal, a nurse at pt's living facility, and she passed on some information about his meds--pertaining to RFs he may be requesting soon b/c he'll be moving out of their facility. He was recently discharged from his PCP/PA--all reasons not known but some belligerent behavior was definitely a factor.  I got the number of his former PCP/PA and will be calling for more information.

## 2013-04-11 ENCOUNTER — Encounter: Payer: Self-pay | Admitting: Family Medicine

## 2013-04-11 DIAGNOSIS — L089 Local infection of the skin and subcutaneous tissue, unspecified: Secondary | ICD-10-CM | POA: Insufficient documentation

## 2013-04-11 DIAGNOSIS — G35 Multiple sclerosis: Secondary | ICD-10-CM | POA: Insufficient documentation

## 2013-04-11 DIAGNOSIS — G894 Chronic pain syndrome: Secondary | ICD-10-CM | POA: Insufficient documentation

## 2013-04-11 NOTE — Assessment & Plan Note (Signed)
Bactroban ointment for application to his healing but slightly infected zoster lesions.

## 2013-04-11 NOTE — Assessment & Plan Note (Signed)
Refer to [facility] neurology and will refer to an ophthalmologist at next f/u visit.

## 2013-04-11 NOTE — Assessment & Plan Note (Signed)
Unclear etiology, but suspect some osteoarthritis. Would like to see pain clinic records to get further insight into this. I recommended he contact [facility] and reschedule the appt he missed earlier this month. No pain med rx's given today.

## 2013-04-12 ENCOUNTER — Ambulatory Visit (INDEPENDENT_AMBULATORY_CARE_PROVIDER_SITE_OTHER): Payer: Medicare Other | Admitting: Family Medicine

## 2013-04-12 ENCOUNTER — Encounter: Payer: Self-pay | Admitting: Family Medicine

## 2013-04-12 VITALS — BP 142/84 | HR 65 | Temp 98.2°F | Resp 18

## 2013-04-12 DIAGNOSIS — E785 Hyperlipidemia, unspecified: Secondary | ICD-10-CM

## 2013-04-12 DIAGNOSIS — Z862 Personal history of diseases of the blood and blood-forming organs and certain disorders involving the immune mechanism: Secondary | ICD-10-CM

## 2013-04-12 DIAGNOSIS — I10 Essential (primary) hypertension: Secondary | ICD-10-CM

## 2013-04-12 DIAGNOSIS — Z8639 Personal history of other endocrine, nutritional and metabolic disease: Secondary | ICD-10-CM

## 2013-04-12 DIAGNOSIS — J449 Chronic obstructive pulmonary disease, unspecified: Secondary | ICD-10-CM

## 2013-04-12 LAB — CBC WITH DIFFERENTIAL/PLATELET
Basophils Absolute: 0 10*3/uL (ref 0.0–0.1)
Basophils Relative: 0.1 % (ref 0.0–3.0)
EOS ABS: 0.3 10*3/uL (ref 0.0–0.7)
EOS PCT: 2.6 % (ref 0.0–5.0)
HCT: 37.3 % — ABNORMAL LOW (ref 39.0–52.0)
Hemoglobin: 12.5 g/dL — ABNORMAL LOW (ref 13.0–17.0)
Lymphocytes Relative: 16.3 % (ref 12.0–46.0)
Lymphs Abs: 1.8 10*3/uL (ref 0.7–4.0)
MCHC: 33.5 g/dL (ref 30.0–36.0)
MCV: 97 fl (ref 78.0–100.0)
Monocytes Absolute: 0.8 10*3/uL (ref 0.1–1.0)
Monocytes Relative: 7.4 % (ref 3.0–12.0)
NEUTROS PCT: 73.6 % (ref 43.0–77.0)
Neutro Abs: 8 10*3/uL — ABNORMAL HIGH (ref 1.4–7.7)
Platelets: 270 10*3/uL (ref 150.0–400.0)
RBC: 3.84 Mil/uL — ABNORMAL LOW (ref 4.22–5.81)
RDW: 15 % — ABNORMAL HIGH (ref 11.5–14.6)
WBC: 10.8 10*3/uL — AB (ref 4.5–10.5)

## 2013-04-12 LAB — COMPREHENSIVE METABOLIC PANEL
ALBUMIN: 3.8 g/dL (ref 3.5–5.2)
ALK PHOS: 97 U/L (ref 39–117)
ALT: 19 U/L (ref 0–53)
AST: 20 U/L (ref 0–37)
BILIRUBIN TOTAL: 0.4 mg/dL (ref 0.3–1.2)
BUN: 21 mg/dL (ref 6–23)
CO2: 35 mEq/L — ABNORMAL HIGH (ref 19–32)
Calcium: 9 mg/dL (ref 8.4–10.5)
Chloride: 97 mEq/L (ref 96–112)
Creatinine, Ser: 1.2 mg/dL (ref 0.4–1.5)
GFR: 64.14 mL/min (ref >60.00)
Glucose, Bld: 97 mg/dL (ref 70–99)
POTASSIUM: 4.5 meq/L (ref 3.5–5.1)
SODIUM: 139 meq/L (ref 135–145)
Total Protein: 7.2 g/dL (ref 6.0–8.3)

## 2013-04-12 LAB — TSH: TSH: 2.58 u[IU]/mL (ref 0.35–5.50)

## 2013-04-12 MED ORDER — FENTANYL 100 MCG/HR TD PT72: 100.0000 ug | MEDICATED_PATCH | TRANSDERMAL | Status: DC

## 2013-04-12 MED ORDER — FENTANYL 100 MCG/HR TD PT72: MEDICATED_PATCH | TRANSDERMAL | Status: DC

## 2013-04-12 NOTE — Progress Notes (Signed)
OFFICE NOTE  04/12/2013  CC:  Chief Complaint  Patient presents with  . Follow-up     HPI: Patient is a 63 y.o. Caucasian male who is here for 2 week f/u after his initial visit to establish care with me.   Has appt 04/17/12 with Dr. Angie Fava to re-establish at [facility] pain mgmt.  Asks for fentanyl patches rx to get him to that appt since his prev PCP stopped rx'ing his pain meds.    Primary sites of pain are both knees and both shoulders: per pt there is talk of eventually replacing all of these joints.  He sees Dr. Ronnie Derby in [facility], who has apparently referred him to [facility] ortho to get further eval for this.   Pertinent PMH:  Past Medical History  Diagnosis Date  . Fibromyalgia   . Multiple sclerosis     with optic neuritis  . Narcolepsy   . Scoliosis   . Hypothyroidism   . HLD (hyperlipidemia)   . Anemia of chronic disease   . Thrombocytopenia   . Pulmonary embolism     "suspected; never confirmed"--? treated with coumadin  . COPD (chronic obstructive pulmonary disease)   . Insomnia   . Carpal tunnel syndrome   . GERD (gastroesophageal reflux disease)   . BPH (benign prostatic hypertrophy)   . History of gout   . Neurogenic bladder     chronic indwelling foley catheter  . Optic neuritis   . History of epistaxis   . History of MRSA infection 2012    UTI, bacteremia, and endocarditis  . Endocarditis 2012  . Supraspinatus tendon tear 2012  . Heart murmur   . Myocardial infarction     "according to stress test; that's news to me"  . Exertional dyspnea   . OSA (obstructive sleep apnea)     "tried CPAP; lost more sleep w/use"  . History of blood transfusion     "gave me too much coumadin and heparin once"  . H/O hiatal hernia   . Arthritis     "everywhere"  . DDD (degenerative disc disease)   . CAD (coronary artery disease) 11/2011    Stents x 3  . Anxiety and depression   . Steroid-induced psychosis   . History of renal failure 2011    "on dialysis 3 times over 2 years"    Past Surgical History  Procedure Laterality Date  . Carpal tunnel release  ~ 2010    left with radius fracture repair  . Nasal cautery    . Skin graft  04/2009    right thigh to right "wrist all the way to my shoulder; necrotizing fasciitis  . Colonoscopy  2000; 2005; 03/15/2011    diminutve adenoma--recall 2017  . Coronary angioplasty  11/22/2011    3 vessels  . Coronary angioplasty with stent placement  11/22/2011    DES to LAD  . Knee arthroscopy w/ meniscal repair  1990's    right    MEDS:  Outpatient Prescriptions Prior to Visit  Medication Sig Dispense Refill  . Armodafinil (NUVIGIL) 250 MG tablet Take 250 mg by mouth daily.      Marland Kitchen aspirin 81 MG chewable tablet Chew 81 mg by mouth daily.      . calcium acetate, Phos Binder, (PHOSLYRA) 667 MG/5ML SOLN Take 667 mg by mouth daily as needed.      . carvedilol (COREG) 12.5 MG tablet Take 12.5 mg by mouth 2 (two) times daily with a meal.       .  clonazePAM (KLONOPIN) 0.5 MG tablet Take 0.5 mg by mouth at bedtime.      . donepezil (ARICEPT) 5 MG tablet Take 5 mg by mouth daily.      . DULoxetine (CYMBALTA) 60 MG capsule Take 60 mg by mouth 2 (two) times daily.      . fentaNYL (DURAGESIC - DOSED MCG/HR) 100 MCG/HR Place 100 mcg onto the skin every 3 (three) days.      . Fluticasone-Salmeterol (ADVAIR) 250-50 MCG/DOSE AEPB Inhale 1 puff into the lungs every 12 (twelve) hours.      Marland Kitchen levalbuterol (XOPENEX HFA) 45 MCG/ACT inhaler Inhale 2 puffs into the lungs 2 (two) times daily as needed. For shortness of breath      . magnesium hydroxide (MILK OF MAGNESIA) 400 MG/5ML suspension Take 30 mLs by mouth at bedtime as needed for mild constipation.      . Melatonin 3 MG TABS Take 3 mg by mouth at bedtime.      . methadone (DOLOPHINE) 10 MG tablet Take 10 mg by mouth daily. Take 8 tablets daily.      . Multiple Vitamin (MULTIVITAMIN WITH MINERALS) TABS Take 1 tablet by mouth daily.      . nitroGLYCERIN (NITROSTAT) 0.3 MG SL tablet Place 0.4 mg  under the tongue every 5 (five) minutes as needed for chest pain.      . vitamin C (ASCORBIC ACID) 500 MG tablet Take 500 mg by mouth daily.      Marland Kitchen albuterol (PROVENTIL HFA;VENTOLIN HFA) 108 (90 BASE) MCG/ACT inhaler Inhale 2 puffs into the lungs every 4 (four) hours as needed. For shortness of breath      . gabapentin (NEURONTIN) 100 MG capsule Take 1 capsule (100 mg total) by mouth 3 (three) times daily.  30 capsule  0  . morphine (MS CONTIN) 60 MG 12 hr tablet Take 60 mg by mouth 3 (three) times daily.       . mupirocin ointment (BACTROBAN) 2 % Apply 1 application topically 3 (three) times daily. Apply to affected area behind right ear and back of head tid x 10d  30 g  0  . nitroGLYCERIN (NITROSTAT) 0.4 MG SL tablet Place 0.4 mg under the tongue every 5 (five) minutes as needed. For chest pain      . Oxycodone HCl 10 MG TABS Take 10 mg by mouth every 4 (four) hours as needed. For breakthrough pain      . ranitidine (ZANTAC) 300 MG capsule Take 300 mg by mouth 2 (two) times daily.      . simvastatin (ZOCOR) 20 MG tablet Take 20 mg by mouth daily.       Marland Kitchen zolpidem (AMBIEN) 10 MG tablet Take 10 mg by mouth at bedtime as needed. For sleep       No facility-administered medications prior to visit.    PE: Blood pressure 142/84, pulse 65, temperature 98.2 F (36.8 C), temperature source Temporal, resp. rate 18, SpO2 95.00%. Gen: alert, well appearing sitting up in wheelchair. CV: RRR, distant S1 and S2. Chest is clear, no wheezing or rales. Normal symmetric air entry throughout both lung fields. No chest wall deformities or tenderness. EXT: no clubbing or cyanosis.  Trace bilat pitting edema in legs, L>R. Gait: walks with a stiff, limping gait.  Gets SOB with 10 steps, RA oxygen sat 93% after this. Musc: decreased ROM of both shoulders and of L-spine.  IMPRESSION AND PLAN:  1) Chronic pain, chronic narcotic med requirement. He knows I  will not manage his chronic pain--he is re-establishing  with [facility].  However, I did give fentanyl rx to get him covered until his upcoming appt in 6 days (today's initial rx printed with incorrect sig, so this was shredded and new rx printed with correct sig).  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 Ruel Favors records for review.  3) Multiple sclerosis: neurology referral has been made.  An After Visit Summary was printed and given to the patient.  Spent 30 min with pt today, with >50% of this time spent in counseling and care coordination regarding the above problems.  FOLLOW UP: 1 mo

## 2013-04-12 NOTE — Progress Notes (Signed)
Pre visit review using our clinic review tool, if applicable. No additional management support is needed unless otherwise documented below in the visit note. 

## 2013-04-22 ENCOUNTER — Telehealth: Payer: Self-pay | Admitting: Family Medicine

## 2013-04-22 MED ORDER — FENTANYL 100 MCG/HR TD PT72: MEDICATED_PATCH | TRANSDERMAL | Status: DC

## 2013-04-22 NOTE — Telephone Encounter (Signed)
Dale Shields from [facility] stating patient needs a new RX for his fentanyl patches.  She states that he only has one patch left and they will not be able to give it to him since his directions state to take two patches.  Also, she was checking to see if you got physician orders from them?  Please advise.

## 2013-04-22 NOTE — Telephone Encounter (Signed)
LISA:  I Spoke with Si Gaul at [facility]. Since I'm awaiting [facility] records from 04/17/13 o/v, I will agree to write fentanyl rx for 15d supply--which will be #10 patches since he takes 2 patches every 3 days. Pt or [facility] staff will need to come to office to pick this rx up and they are aware of this.  DIANE: Pls request records from [facility]--office note from 04/17/13.-thx

## 2013-04-22 NOTE — Telephone Encounter (Signed)
Patient was seen at [facility] on 04/17/13.  He also has two apt scheduled there, 04/25/13 and 05/15/13.  Britney's call back number is [phone].

## 2013-04-26 NOTE — Telephone Encounter (Signed)
Record release form was sent to patient.

## 2013-04-30 ENCOUNTER — Telehealth: Payer: Self-pay | Admitting: Family Medicine

## 2013-04-30 ENCOUNTER — Telehealth: Payer: Self-pay | Admitting: Neurology

## 2013-04-30 ENCOUNTER — Ambulatory Visit: Payer: Medicare Other | Admitting: Neurology

## 2013-04-30 NOTE — Telephone Encounter (Signed)
Patient had to resch appt he was held up at another dr office today he is really sorry he said

## 2013-04-30 NOTE — Telephone Encounter (Signed)
I talked to an MD today on the phone from [facility] in [place].  He was trying to verify recent pain med rx's.  I explained that I was his new PCP and had only bridged his fentanyl rx until he got f/u with [facility].  I explained that pt's previous primary care provider had been filling his pain med rx's prior to me seeing him for the first time on 12/.2014  He has agreed to start seeing Mr. Vigen again and we agreed that he would be the only MD to write any pain med rx's for Mr. Ham.

## 2013-05-02 ENCOUNTER — Telehealth: Payer: Self-pay | Admitting: Family Medicine

## 2013-05-02 NOTE — Telephone Encounter (Signed)
Pls tell her to call [facility] for this order b/c they are officially back in charge of anything to do with his pain meds.  --thx

## 2013-05-02 NOTE — Telephone Encounter (Signed)
[name] from [facility] would like an order to D/C pt's methadone.  Patient hasn't been taking it because he wants to get his fetynal patches increased.  Please advise.

## 2013-05-07 ENCOUNTER — Encounter (HOSPITAL_COMMUNITY): Payer: Self-pay | Admitting: Emergency Medicine

## 2013-05-07 ENCOUNTER — Emergency Department (HOSPITAL_COMMUNITY)
Admission: EM | Admit: 2013-05-07 | Discharge: 2013-05-07 | Disposition: A | Discharge status: Home / Self Care | Payer: Medicare Other | Attending: Emergency Medicine | Admitting: Emergency Medicine

## 2013-05-07 DIAGNOSIS — K219 Gastro-esophageal reflux disease without esophagitis: Secondary | ICD-10-CM | POA: Insufficient documentation

## 2013-05-07 DIAGNOSIS — Z7982 Long term (current) use of aspirin: Secondary | ICD-10-CM | POA: Insufficient documentation

## 2013-05-07 DIAGNOSIS — N186 End stage renal disease: Secondary | ICD-10-CM | POA: Insufficient documentation

## 2013-05-07 DIAGNOSIS — IMO0001 Reserved for inherently not codable concepts without codable children: Secondary | ICD-10-CM | POA: Insufficient documentation

## 2013-05-07 DIAGNOSIS — Z86711 Personal history of pulmonary embolism: Secondary | ICD-10-CM | POA: Insufficient documentation

## 2013-05-07 DIAGNOSIS — IMO0002 Reserved for concepts with insufficient information to code with codable children: Secondary | ICD-10-CM | POA: Insufficient documentation

## 2013-05-07 DIAGNOSIS — Z8719 Personal history of other diseases of the digestive system: Secondary | ICD-10-CM | POA: Insufficient documentation

## 2013-05-07 DIAGNOSIS — M1990 Unspecified osteoarthritis, unspecified site: Secondary | ICD-10-CM | POA: Insufficient documentation

## 2013-05-07 DIAGNOSIS — F411 Generalized anxiety disorder: Secondary | ICD-10-CM | POA: Insufficient documentation

## 2013-05-07 DIAGNOSIS — Z79899 Other long term (current) drug therapy: Secondary | ICD-10-CM | POA: Insufficient documentation

## 2013-05-07 DIAGNOSIS — G8929 Other chronic pain: Secondary | ICD-10-CM | POA: Insufficient documentation

## 2013-05-07 DIAGNOSIS — I252 Old myocardial infarction: Secondary | ICD-10-CM | POA: Insufficient documentation

## 2013-05-07 DIAGNOSIS — Z9861 Coronary angioplasty status: Secondary | ICD-10-CM | POA: Insufficient documentation

## 2013-05-07 DIAGNOSIS — Z8639 Personal history of other endocrine, nutritional and metabolic disease: Secondary | ICD-10-CM | POA: Insufficient documentation

## 2013-05-07 DIAGNOSIS — R209 Unspecified disturbances of skin sensation: Secondary | ICD-10-CM | POA: Insufficient documentation

## 2013-05-07 DIAGNOSIS — G35 Multiple sclerosis: Secondary | ICD-10-CM | POA: Insufficient documentation

## 2013-05-07 DIAGNOSIS — Z8614 Personal history of Methicillin resistant Staphylococcus aureus infection: Secondary | ICD-10-CM | POA: Insufficient documentation

## 2013-05-07 DIAGNOSIS — R5381 Other malaise: Secondary | ICD-10-CM | POA: Insufficient documentation

## 2013-05-07 DIAGNOSIS — J449 Chronic obstructive pulmonary disease, unspecified: Secondary | ICD-10-CM | POA: Insufficient documentation

## 2013-05-07 DIAGNOSIS — R011 Cardiac murmur, unspecified: Secondary | ICD-10-CM | POA: Insufficient documentation

## 2013-05-07 DIAGNOSIS — I2510 Atherosclerotic heart disease of native coronary artery without angina pectoris: Secondary | ICD-10-CM | POA: Insufficient documentation

## 2013-05-07 DIAGNOSIS — J4489 Other specified chronic obstructive pulmonary disease: Secondary | ICD-10-CM | POA: Insufficient documentation

## 2013-05-07 DIAGNOSIS — F329 Major depressive disorder, single episode, unspecified: Secondary | ICD-10-CM | POA: Insufficient documentation

## 2013-05-07 DIAGNOSIS — F17200 Nicotine dependence, unspecified, uncomplicated: Secondary | ICD-10-CM | POA: Insufficient documentation

## 2013-05-07 DIAGNOSIS — R5383 Other fatigue: Secondary | ICD-10-CM

## 2013-05-07 DIAGNOSIS — G4700 Insomnia, unspecified: Secondary | ICD-10-CM | POA: Insufficient documentation

## 2013-05-07 DIAGNOSIS — Z862 Personal history of diseases of the blood and blood-forming organs and certain disorders involving the immune mechanism: Secondary | ICD-10-CM | POA: Insufficient documentation

## 2013-05-07 DIAGNOSIS — Z87828 Personal history of other (healed) physical injury and trauma: Secondary | ICD-10-CM | POA: Insufficient documentation

## 2013-05-07 DIAGNOSIS — G4733 Obstructive sleep apnea (adult) (pediatric): Secondary | ICD-10-CM | POA: Insufficient documentation

## 2013-05-07 DIAGNOSIS — M4120 Other idiopathic scoliosis, site unspecified: Secondary | ICD-10-CM | POA: Insufficient documentation

## 2013-05-07 DIAGNOSIS — F3289 Other specified depressive episodes: Secondary | ICD-10-CM | POA: Insufficient documentation

## 2013-05-07 DIAGNOSIS — Z992 Dependence on renal dialysis: Secondary | ICD-10-CM | POA: Insufficient documentation

## 2013-05-07 DIAGNOSIS — Z87448 Personal history of other diseases of urinary system: Secondary | ICD-10-CM | POA: Insufficient documentation

## 2013-05-07 MED ORDER — FENTANYL CITRATE 0.05 MG/ML IJ SOLN
100.0000 ug | Freq: Once | INTRAMUSCULAR | Status: DC
Start: 2013-05-07 — End: 2013-05-07

## 2013-05-07 MED ORDER — FENTANYL CITRATE 0.05 MG/ML IJ SOLN
100.0000 ug | Freq: Once | INTRAMUSCULAR | Status: AC
Start: 2013-05-07 — End: 2013-05-07
  Administered 2013-05-07: 100 ug via INTRAMUSCULAR
  Filled 2013-05-07: qty 2

## 2013-05-07 NOTE — ED Provider Notes (Signed)
CSN: [id number]     Arrival date & time 05/07/13  1813 History  This chart was scribed for non-physician practitioner Hazel Sams, PA-C working with Ephraim Hamburger, MD by Anastasia Pall, ED scribe. This patient was seen in room WTR6/WTR6 and the patient's care was started at 9:00 PM.   Chief Complaint  Patient presents with  . Medication Refill    The history is provided by the patient. No language interpreter was used.   HPI Comments: Dale Shields is a 63 y.o. male with h/o MS, who presents to the Emergency Department complaining of medication refill. He reports being from [facility], been there since 08/2012. He reports being on a lot of pain medication. He reports taking himself of 80 mg Methadone 2 weeks ago. He reports planning action with doctor due to bad results. He reports normally having Fentanyl patch, 2x 200. He reports putting his patch on 5:00 pm, used for 3 days. He reports trying Hydrocodone, without relief. He reports his bones have been hurting a little more than usual. He reports intermittent weakness and numbness in his LE. He also reports h/o emphysema, narcolepsy, arthritis, pinched nerves in spine, degenerative spine disease, GERD, left testicle hernia, fascitis.   PCP - Tammi Sou, MD  Past Medical History  Diagnosis Date  . Fibromyalgia   . Multiple sclerosis     with optic neuritis  . Narcolepsy   . Scoliosis   . Hypothyroidism   . HLD (hyperlipidemia)   . Anemia of chronic disease   . Thrombocytopenia   . Pulmonary embolism     "suspected; never confirmed"--? treated with coumadin  . COPD (chronic obstructive pulmonary disease)   . Insomnia   . Carpal tunnel syndrome   . GERD (gastroesophageal reflux disease)   . BPH (benign prostatic hypertrophy)   . History of gout   . Neurogenic bladder     chronic indwelling foley catheter-changed monthly  . Optic neuritis   . History of epistaxis   . History of MRSA infection 2012    UTI, bacteremia,  and endocarditis  . Endocarditis 2012  . Supraspinatus tendon tear 2012  . Heart murmur   . Myocardial infarction     "according to stress test; that's news to me"  . Exertional dyspnea   . OSA (obstructive sleep apnea)     "tried CPAP; lost more sleep w/use"  . History of blood transfusion     "gave me too much coumadin and heparin once"  . H/O hiatal hernia   . Osteoarthritis of multiple joints     "everywhere"  . DDD (degenerative disc disease)     knees, hips, shoulders--Dr. Ronnie Derby [facility] prior to this)  . CAD (coronary artery disease) 11/2011    Stents x 3  . Anxiety and depression   . Steroid-induced psychosis   . History of renal failure 2011    "on dialysis 3 times over 2 years"   Past Surgical History  Procedure Laterality Date  . Carpal tunnel release  ~ 2010    left with radius fracture repair  . Nasal cautery    . Skin graft  04/2009    right thigh to right "wrist all the way to my shoulder; necrotizing fasciitis  . Colonoscopy  2000; 2005; 03/15/2011    diminutve adenoma--recall 2017  . Coronary angioplasty  11/22/2011    3 vessels  . Coronary angioplasty with stent placement  11/22/2011    DES to LAD  . Knee arthroscopy  w/ meniscal repair  34's    right   Family History  Problem Relation Age of Onset  . Alzheimer's disease Mother   . Colon cancer Neg Hx    History  Substance Use Topics  . Smoking status: Current Every Day Smoker -- 1.00 packs/day for 40 years    Types: Cigarettes  . Smokeless tobacco: Never Used     Comment: Counseling sheet to quit smoking given in exam room   . Alcohol Use: Yes     Comment: 11/22/2011 "like crazy til 1988; nothing since"    Review of Systems  Constitutional: Negative for fever.  Musculoskeletal: Positive for myalgias (increased, h/o MS).  Neurological: Positive for weakness and numbness.  All other systems reviewed and are negative.    Allergies  Ultram  Home Medications   Current Outpatient Rx  Name   Route  Sig  Dispense  Refill  . acetaminophen (TYLENOL) 500 MG tablet   Oral   Take 500 mg by mouth every 4 (four) hours as needed for mild pain, fever or headache.         . albuterol (PROVENTIL HFA;VENTOLIN HFA) 108 (90 BASE) MCG/ACT inhaler   Inhalation   Inhale 2 puffs into the lungs every 4 (four) hours as needed. For shortness of breath         . Armodafinil (NUVIGIL) 250 MG tablet   Oral   Take 250 mg by mouth daily.         Marland Kitchen aspirin 81 MG chewable tablet   Oral   Chew 81 mg by mouth daily.         . busPIRone (BUSPAR) 15 MG tablet      15 mg 2 (two) times daily.          . calcium acetate, Phos Binder, (PHOSLYRA) 667 MG/5ML SOLN   Oral   Take 667 mg by mouth daily as needed (For indigestion).          . carvedilol (COREG) 6.25 MG tablet   Oral   Take 6.25 mg by mouth 2 (two) times daily with a meal.         . clonazePAM (KLONOPIN) 0.5 MG tablet   Oral   Take 0.25 mg by mouth at bedtime. 0.25mg  qhs and 0.25mg  every 12 hours as needed for anxiety         . donepezil (ARICEPT) 10 MG tablet   Oral   Take 10 mg by mouth at bedtime.         . DULoxetine (CYMBALTA) 60 MG capsule   Oral   Take 60 mg by mouth 2 (two) times daily.         . fentaNYL (DURAGESIC - DOSED MCG/HR) 100 MCG/HR      Apply two patches q72 hours   10 patch   0   . Fluticasone-Salmeterol (ADVAIR) 250-50 MCG/DOSE AEPB   Inhalation   Inhale 1 puff into the lungs every 12 (twelve) hours.         Marland Kitchen guaifenesin (ROBITUSSIN) 100 MG/5ML syrup   Oral   Take 200 mg by mouth every 6 (six) hours as needed for cough.         Marland Kitchen HYDROcodone-acetaminophen (NORCO/VICODIN) 5-325 MG per tablet   Oral   Take 1 tablet by mouth every 4 (four) hours as needed for moderate pain.         Marland Kitchen levalbuterol (XOPENEX HFA) 45 MCG/ACT inhaler   Inhalation   Inhale 2 puffs into  the lungs 2 (two) times daily as needed. For shortness of breath         . loperamide (IMODIUM) 2 MG capsule    Oral   Take 2 mg by mouth as needed for diarrhea or loose stools (As needed with each loose stool for diarrhea).         . magnesium hydroxide (MILK OF MAGNESIA) 400 MG/5ML suspension   Oral   Take 30 mLs by mouth at bedtime as needed for mild constipation.         . Multiple Vitamins-Minerals (THEREMS-M) TABS   Oral   Take 1 tablet by mouth daily.         . nitroGLYCERIN (NITROSTAT) 0.3 MG SL tablet   Sublingual   Place 0.4 mg under the tongue every 5 (five) minutes as needed for chest pain.         . vitamin C (ASCORBIC ACID) 500 MG tablet   Oral   Take 1,000 mg by mouth 2 (two) times daily.           BP 187/105  Pulse 56  Temp(Src) 97.3 F (36.3 C) (Oral)  Resp 16  SpO2 94%  Physical Exam  Nursing note and vitals reviewed. Constitutional: He is oriented to person, place, and time. He appears well-developed and well-nourished. No distress.  HENT:  Head: Normocephalic and atraumatic.  Eyes: EOM are normal.  Neck: Neck supple.  Cardiovascular: Normal rate, regular rhythm and normal heart sounds.   No murmur heard. Pulmonary/Chest: Effort normal and breath sounds normal. No respiratory distress. He has no wheezes. He has no rales.  Abdominal: Soft. There is no tenderness.  Musculoskeletal: Normal range of motion.  Neurological: He is alert and oriented to person, place, and time.  Patellar reflexes normal. Good sensation LE.   Skin: Skin is warm and dry.  Extensive old scarring over left chest, shoulder, arm.   Psychiatric: He has a normal mood and affect. His behavior is normal.    ED Course  Procedures   DIAGNOSTIC STUDIES: Oxygen Saturation is 94% on room air, adequate by my interpretation.    COORDINATION OF CARE: 9:06 PM-the patient seen and evaluated. Patient appears well does not appear in severe distress or pain and discomfort. Patient has no change in his chronic pain symptoms. Simply requesting prescription for fentanyl patches. I discussed with  patient that I would not give prescriptions for his chronic pain management however I would treat him with one dose of fentanyl here IM for his chronic pain. Discussed treatment plan with pt at bedside and pt agreed to plan.    Medications  fentaNYL (SUBLIMAZE) injection 100 mcg (not administered)    MDM   1. Chronic pain        I personally performed the services described in this documentation, which was scribed in my presence. The recorded information has been reviewed and is accurate.   Martie Lee, PA-C 05/07/13 2115

## 2013-05-07 NOTE — ED Notes (Signed)
Per EMS patient with Hx of MS comes to ED for chronic pain problem, out of prescribed pain medicine, needs refill, for chronic MS pain.

## 2013-05-07 NOTE — ED Notes (Signed)
Pt comes in by EMS from [facility] for medication refill, bc he doesn't have on hand what he is prescribed.

## 2013-05-07 NOTE — Discharge Instructions (Signed)
Please followup with your doctors for your chronic pain management.    Chronic Pain Chronic pain can be defined as pain that is off and on and lasts for 3 6 months or longer. Many things cause chronic pain, which can make it difficult to make a diagnosis. There are many treatment options available for chronic pain. However, finding a treatment that works well for you may require trying various approaches until the right one is found. Many people benefit from a combination of two or more types of treatment to control their pain. SYMPTOMS  Chronic pain can occur anywhere in the body and can range from mild to very severe. Some types of chronic pain include:  Headache.  Low back pain.  Cancer pain.  Arthritis pain.  Neurogenic pain. This is pain resulting from damage to nerves. People with chronic pain may also have other symptoms such as:  Depression.  Anger.  Insomnia.  Anxiety. DIAGNOSIS  Your health care provider will help diagnose your condition over time. In many cases, the initial focus will be on excluding possible conditions that could be causing the pain. Depending on your symptoms, your health care provider may order tests to diagnose your condition. Some of these tests may include:   Blood tests.   CT scan.   MRI.   X-rays.   Ultrasounds.   Nerve conduction studies.  You may need to see a specialist.  TREATMENT  Finding treatment that works well may take time. You may be referred to a pain specialist. He or she may prescribe medicine or therapies, such as:   Mindful meditation or yoga.  Shots (injections) of numbing or pain-relieving medicines into the spine or area of pain.  Local electrical stimulation.  Acupuncture.   Massage therapy.   Aroma, color, light, or sound therapy.   Biofeedback.   Working with a physical therapist to keep from getting stiff.   Regular, gentle exercise.   Cognitive or behavioral therapy.   Group  support.  Sometimes, surgery may be recommended.  HOME CARE INSTRUCTIONS   Take all medicines as directed by your health care provider.   Lessen stress in your life by relaxing and doing things such as listening to calming music.   Exercise or be active as directed by your health care provider.   Eat a healthy diet and include things such as vegetables, fruits, fish, and lean meats in your diet.   Keep all follow-up appointments with your health care provider.   Attend a support group with others suffering from chronic pain. SEEK MEDICAL CARE IF:   Your pain gets worse.   You develop a new pain that was not there before.   You cannot tolerate medicines given to you by your health care provider.   You have new symptoms since your last visit with your health care provider.  SEEK IMMEDIATE MEDICAL CARE IF:   You feel weak.   You have decreased sensation or numbness.   You lose control of bowel or bladder function.   Your pain suddenly gets much worse.   You develop shaking.  You develop chills.  You develop confusion.  You develop chest pain.  You develop shortness of breath.  MAKE SURE YOU:  Understand these instructions.  Will watch your condition.  Will get help right away if you are not doing well or get worse. Document Released: 12/18/2001 Document Revised: 11/28/2012 Document Reviewed: 09/21/2012 [facility] Patient Information 2014 [place].

## 2013-05-08 NOTE — ED Provider Notes (Signed)
Medical screening examination/treatment/procedure(s) were performed by non-physician practitioner and as supervising physician I was immediately available for consultation/collaboration.  EKG Interpretation   None         Ephraim Hamburger, MD 05/08/13 1113

## 2013-05-09 ENCOUNTER — Telehealth: Payer: Self-pay | Admitting: Family Medicine

## 2013-05-09 NOTE — Telephone Encounter (Signed)
Relevant patient education mailed to patient.  

## 2013-05-12 ENCOUNTER — Emergency Department (HOSPITAL_COMMUNITY)
Admission: EM | Admit: 2013-05-12 | Discharge: 2013-05-12 | Disposition: A | Discharge status: Home / Self Care | Payer: Medicare Other | Attending: Emergency Medicine | Admitting: Emergency Medicine

## 2013-05-12 ENCOUNTER — Encounter (HOSPITAL_COMMUNITY): Payer: Self-pay | Admitting: Emergency Medicine

## 2013-05-12 DIAGNOSIS — M4120 Other idiopathic scoliosis, site unspecified: Secondary | ICD-10-CM | POA: Insufficient documentation

## 2013-05-12 DIAGNOSIS — N186 End stage renal disease: Secondary | ICD-10-CM | POA: Insufficient documentation

## 2013-05-12 DIAGNOSIS — R51 Headache: Secondary | ICD-10-CM | POA: Insufficient documentation

## 2013-05-12 DIAGNOSIS — I2510 Atherosclerotic heart disease of native coronary artery without angina pectoris: Secondary | ICD-10-CM | POA: Insufficient documentation

## 2013-05-12 DIAGNOSIS — Z8639 Personal history of other endocrine, nutritional and metabolic disease: Secondary | ICD-10-CM | POA: Insufficient documentation

## 2013-05-12 DIAGNOSIS — Z7982 Long term (current) use of aspirin: Secondary | ICD-10-CM | POA: Insufficient documentation

## 2013-05-12 DIAGNOSIS — J4489 Other specified chronic obstructive pulmonary disease: Secondary | ICD-10-CM | POA: Insufficient documentation

## 2013-05-12 DIAGNOSIS — Z8614 Personal history of Methicillin resistant Staphylococcus aureus infection: Secondary | ICD-10-CM | POA: Insufficient documentation

## 2013-05-12 DIAGNOSIS — Z9861 Coronary angioplasty status: Secondary | ICD-10-CM | POA: Insufficient documentation

## 2013-05-12 DIAGNOSIS — Z86711 Personal history of pulmonary embolism: Secondary | ICD-10-CM | POA: Insufficient documentation

## 2013-05-12 DIAGNOSIS — M1990 Unspecified osteoarthritis, unspecified site: Secondary | ICD-10-CM | POA: Insufficient documentation

## 2013-05-12 DIAGNOSIS — Z87440 Personal history of urinary (tract) infections: Secondary | ICD-10-CM | POA: Insufficient documentation

## 2013-05-12 DIAGNOSIS — Z87448 Personal history of other diseases of urinary system: Secondary | ICD-10-CM | POA: Insufficient documentation

## 2013-05-12 DIAGNOSIS — G4700 Insomnia, unspecified: Secondary | ICD-10-CM | POA: Insufficient documentation

## 2013-05-12 DIAGNOSIS — I120 Hypertensive chronic kidney disease with stage 5 chronic kidney disease or end stage renal disease: Secondary | ICD-10-CM | POA: Insufficient documentation

## 2013-05-12 DIAGNOSIS — G4733 Obstructive sleep apnea (adult) (pediatric): Secondary | ICD-10-CM | POA: Insufficient documentation

## 2013-05-12 DIAGNOSIS — Z992 Dependence on renal dialysis: Secondary | ICD-10-CM | POA: Insufficient documentation

## 2013-05-12 DIAGNOSIS — F17200 Nicotine dependence, unspecified, uncomplicated: Secondary | ICD-10-CM | POA: Insufficient documentation

## 2013-05-12 DIAGNOSIS — Z79899 Other long term (current) drug therapy: Secondary | ICD-10-CM | POA: Insufficient documentation

## 2013-05-12 DIAGNOSIS — Z862 Personal history of diseases of the blood and blood-forming organs and certain disorders involving the immune mechanism: Secondary | ICD-10-CM | POA: Insufficient documentation

## 2013-05-12 DIAGNOSIS — F329 Major depressive disorder, single episode, unspecified: Secondary | ICD-10-CM | POA: Insufficient documentation

## 2013-05-12 DIAGNOSIS — Z760 Encounter for issue of repeat prescription: Secondary | ICD-10-CM

## 2013-05-12 DIAGNOSIS — J449 Chronic obstructive pulmonary disease, unspecified: Secondary | ICD-10-CM | POA: Insufficient documentation

## 2013-05-12 DIAGNOSIS — I252 Old myocardial infarction: Secondary | ICD-10-CM | POA: Insufficient documentation

## 2013-05-12 DIAGNOSIS — F411 Generalized anxiety disorder: Secondary | ICD-10-CM | POA: Insufficient documentation

## 2013-05-12 DIAGNOSIS — F3289 Other specified depressive episodes: Secondary | ICD-10-CM | POA: Insufficient documentation

## 2013-05-12 DIAGNOSIS — F19988 Other psychoactive substance use, unspecified with other psychoactive substance-induced disorder: Secondary | ICD-10-CM | POA: Insufficient documentation

## 2013-05-12 DIAGNOSIS — R011 Cardiac murmur, unspecified: Secondary | ICD-10-CM | POA: Insufficient documentation

## 2013-05-12 DIAGNOSIS — M549 Dorsalgia, unspecified: Secondary | ICD-10-CM | POA: Insufficient documentation

## 2013-05-12 DIAGNOSIS — Z87828 Personal history of other (healed) physical injury and trauma: Secondary | ICD-10-CM | POA: Insufficient documentation

## 2013-05-12 DIAGNOSIS — Z8719 Personal history of other diseases of the digestive system: Secondary | ICD-10-CM | POA: Insufficient documentation

## 2013-05-12 DIAGNOSIS — IMO0001 Reserved for inherently not codable concepts without codable children: Secondary | ICD-10-CM | POA: Insufficient documentation

## 2013-05-12 DIAGNOSIS — IMO0002 Reserved for concepts with insufficient information to code with codable children: Secondary | ICD-10-CM | POA: Insufficient documentation

## 2013-05-12 MED ORDER — CLONAZEPAM 0.5 MG PO TABS
0.5000 mg | ORAL_TABLET | Freq: Once | ORAL | Status: AC
  Administered 2013-05-12: 0.5 mg via ORAL
  Filled 2013-05-12: qty 1

## 2013-05-12 MED ORDER — FENTANYL 100 MCG/HR TD PT72: 100.0000 ug | MEDICATED_PATCH | TRANSDERMAL | Status: DC

## 2013-05-12 MED ORDER — FENTANYL 100 MCG/HR TD PT72
100.0000 ug | MEDICATED_PATCH | TRANSDERMAL | Status: DC
  Administered 2013-05-12: 100 ug via TRANSDERMAL
  Filled 2013-05-12: qty 1

## 2013-05-12 MED ORDER — FENTANYL 100 MCG/HR TD PT72
100.0000 ug | MEDICATED_PATCH | Freq: Once | TRANSDERMAL | Status: DC
  Administered 2013-05-12: 100 ug via TRANSDERMAL
  Filled 2013-05-12: qty 1

## 2013-05-12 MED ORDER — CLONAZEPAM 0.5 MG PO TABS: 0.5000 mg | ORAL_TABLET | Freq: Two times a day (BID) | ORAL | Status: DC | PRN

## 2013-05-12 MED ORDER — FENTANYL CITRATE 0.05 MG/ML IJ SOLN: 100.0000 ug | Freq: Once | INTRAMUSCULAR | Status: DC

## 2013-05-12 NOTE — ED Notes (Signed)
He states he has many chronic pain issues d/t fibromyalgia; plus other medical events from his past.  He c/o generalized aches, esp. Back pan and right eye/orbit area pain, none of which are new or acute.  He is in no distress.  He comes to [name] from [facility].  He states he is out of his pain meds.

## 2013-05-12 NOTE — ED Provider Notes (Signed)
CSN: [id number]     Arrival date & time 05/12/13  1002 History   First MD Initiated Contact with Patient 05/12/13 1003     No chief complaint on file.  (Consider location/radiation/quality/duration/timing/severity/associated sxs/prior Treatment) HPI Comments: Dale Shields is a 63 year-old male with a past medical history of , presenting the Emergency Department with a chief complaint of back pain, lower extremity pain.  He reports he has been unable to receive his fentanyl patch for his chronic pain.  He reports the staff at [facility] will not give him the one remaining fentanyl patch.  He reports all chronic issues that have not changed.  He denies fever, chills, abdominal pain, nausea, vomiting.  The patient is requesting a "fentanyl shot", "like last time".  PCP - Tammi Sou, MD Chronic Pain: [facility]  The history is provided by the patient and medical records. No language interpreter was used.    Past Medical History  Diagnosis Date  . Fibromyalgia   . Multiple sclerosis     with optic neuritis  . Narcolepsy   . Scoliosis   . Hypothyroidism   . HLD (hyperlipidemia)   . Anemia of chronic disease   . Thrombocytopenia   . Pulmonary embolism     "suspected; never confirmed"--? treated with coumadin  . COPD (chronic obstructive pulmonary disease)   . Insomnia   . Carpal tunnel syndrome   . GERD (gastroesophageal reflux disease)   . BPH (benign prostatic hypertrophy)   . History of gout   . Neurogenic bladder     chronic indwelling foley catheter-changed monthly  . Optic neuritis   . History of epistaxis   . History of MRSA infection 2012    UTI, bacteremia, and endocarditis  . Endocarditis 2012  . Supraspinatus tendon tear 2012  . Heart murmur   . Myocardial infarction     "according to stress test; that's news to me"  . Exertional dyspnea   . OSA (obstructive sleep apnea)     "tried CPAP; lost more sleep w/use"  .  History of blood transfusion     "gave me too much coumadin and heparin once"  . H/O hiatal hernia   . Osteoarthritis of multiple joints     "everywhere"  . DDD (degenerative disc disease)     knees, hips, shoulders--Dr. Ronnie Derby [facility] prior to this)  . CAD (coronary artery disease) 11/2011    Stents x 3  . Anxiety and depression   . Steroid-induced psychosis   . History of renal failure 2011    "on dialysis 3 times over 2 years"   Past Surgical History  Procedure Laterality Date  . Carpal tunnel release  ~ 2010    left with radius fracture repair  . Nasal cautery    . Skin graft  04/2009    right thigh to right "wrist all the way to my shoulder; necrotizing fasciitis  . Colonoscopy  2000; 2005; 03/15/2011    diminutve adenoma--recall 2017  . Coronary angioplasty  11/22/2011    3 vessels  . Coronary angioplasty with stent placement  11/22/2011    DES to LAD  . Knee arthroscopy w/ meniscal repair  71's    right   Family History  Problem Relation Age of Onset  . Alzheimer's disease Mother   . Colon cancer Neg Hx    History  Substance Use Topics  . Smoking status: Current Every Day Smoker -- 1.00 packs/day for 40  years    Types: Cigarettes  . Smokeless tobacco: Never Used     Comment: Counseling sheet to quit smoking given in exam room   . Alcohol Use: Yes     Comment: 11/22/2011 "like crazy til 1988; nothing since"    Review of Systems  Constitutional: Negative for fever and chills.  Eyes: Negative for photophobia and visual disturbance.  Gastrointestinal: Negative for nausea, vomiting, abdominal pain, diarrhea and constipation.  Musculoskeletal: Positive for back pain.  Neurological: Positive for headaches.    Allergies  Ultram  Home Medications   Current Outpatient Rx  Name  Route  Sig  Dispense  Refill  . acetaminophen (TYLENOL) 500 MG tablet   Oral   Take 500 mg by mouth every 4 (four) hours as needed for mild pain, fever or headache.         . albuterol  (PROVENTIL HFA;VENTOLIN HFA) 108 (90 BASE) MCG/ACT inhaler   Inhalation   Inhale 2 puffs into the lungs every 4 (four) hours as needed. For shortness of breath         . Armodafinil (NUVIGIL) 250 MG tablet   Oral   Take 250 mg by mouth daily.         Marland Kitchen aspirin 81 MG chewable tablet   Oral   Chew 81 mg by mouth daily.         . busPIRone (BUSPAR) 15 MG tablet      15 mg 2 (two) times daily.          . calcium acetate, Phos Binder, (PHOSLYRA) 667 MG/5ML SOLN   Oral   Take 667 mg by mouth daily as needed (For indigestion).          . carvedilol (COREG) 6.25 MG tablet   Oral   Take 6.25 mg by mouth 2 (two) times daily with a meal.         . clonazePAM (KLONOPIN) 0.5 MG tablet   Oral   Take 0.25 mg by mouth at bedtime. 0.25mg  qhs and 0.25mg  every 12 hours as needed for anxiety         . cloNIDine (CATAPRES - DOSED IN MG/24 HR) 0.1 mg/24hr patch               . donepezil (ARICEPT) 10 MG tablet   Oral   Take 10 mg by mouth at bedtime.         . DULoxetine (CYMBALTA) 60 MG capsule   Oral   Take 60 mg by mouth 2 (two) times daily.         . fentaNYL (DURAGESIC - DOSED MCG/HR) 100 MCG/HR      Apply two patches q72 hours   10 patch   0   . Fluticasone-Salmeterol (ADVAIR) 250-50 MCG/DOSE AEPB   Inhalation   Inhale 1 puff into the lungs every 12 (twelve) hours.         Marland Kitchen guaifenesin (ROBITUSSIN) 100 MG/5ML syrup   Oral   Take 200 mg by mouth every 6 (six) hours as needed for cough.         Marland Kitchen HYDROcodone-acetaminophen (NORCO/VICODIN) 5-325 MG per tablet   Oral   Take 1 tablet by mouth every 4 (four) hours as needed for moderate pain.         Marland Kitchen levalbuterol (XOPENEX HFA) 45 MCG/ACT inhaler   Inhalation   Inhale 2 puffs into the lungs 2 (two) times daily as needed. For shortness of breath         .  loperamide (IMODIUM) 2 MG capsule   Oral   Take 2 mg by mouth as needed for diarrhea or loose stools (As needed with each loose stool for  diarrhea).         . magnesium hydroxide (MILK OF MAGNESIA) 400 MG/5ML suspension   Oral   Take 30 mLs by mouth at bedtime as needed for mild constipation.         . Multiple Vitamins-Minerals (THEREMS-M) TABS   Oral   Take 1 tablet by mouth daily.         . nitroGLYCERIN (NITROSTAT) 0.3 MG SL tablet   Sublingual   Place 0.4 mg under the tongue every 5 (five) minutes as needed for chest pain.         . vitamin C (ASCORBIC ACID) 500 MG tablet   Oral   Take 1,000 mg by mouth 2 (two) times daily.           There were no vitals taken for this visit. Physical Exam  Nursing note and vitals reviewed. Constitutional: He is oriented to person, place, and time. He appears well-developed and well-nourished.  HENT:  Head: Normocephalic and atraumatic.  Eyes: EOM are normal. Pupils are equal, round, and reactive to light.  Neck: Normal range of motion. Neck supple.  Cardiovascular: Normal rate and regular rhythm.   Pulmonary/Chest: Effort normal and breath sounds normal. He has no wheezes. He has no rales.  Abdominal: Soft. Bowel sounds are normal.  Genitourinary:  Foley bag on right lower extremity.  Musculoskeletal: Normal range of motion.  Neurological: He is alert and oriented to person, place, and time. He displays no atrophy. No cranial nerve deficit or sensory deficit. He exhibits normal muscle tone.  Reflex Scores:      Patellar reflexes are 2+ on the right side and 2+ on the left side. Skin: Skin is warm and dry. He is not diaphoretic.  Psychiatric: He has a normal mood and affect. His behavior is normal.    ED Course  Procedures (including critical care time) Labs Review Labs Reviewed - No data to display Imaging Review No results found.  EKG Interpretation   None       MDM   1. Medication refill    Per [facility] facility worker, they are unable to release the fentanyl patch because the Rx reads two patches and the pharmacy, [facility], is closed on Sunday.  She also reports the patient is out of his Klonopin and was unable to fill it due to being 12 days early. Will reorder home medication of fentanly and klonopin, for chronic pain and to stop withdraw seizures. And a refill for Klonopin until he can contact his PCP.  Lab-work was ordered in error on the wrong patient.  Discussed patient history, condition, and labs with Dr. Dorna Mai who agrees the patient can be evaluated as an out-pt. Discussed treatment plan with the patient. Return precautions given. Reports understanding and no other concerns at this time.  Patient is stable for discharge at this time. Meds given in ED:  Medications  clonazePAM (KLONOPIN) tablet 0.5 mg (0.5 mg Oral Given 05/12/13 1145)    Discharge Medication List as of 05/12/2013 11:56 AM    START taking these medications   Details  !! clonazePAM (KLONOPIN) 0.5 MG tablet Take 1 tablet (0.5 mg total) by mouth 2 (two) times daily as needed for anxiety., Starting 05/12/2013, Until Discontinued, Print     !! - Potential duplicate  medications found. Please discuss with provider.           Lorrine Kin, PA-C 05/16/13 [phone]

## 2013-05-12 NOTE — Discharge Instructions (Signed)
Call for a follow up appointment with a Family or Primary Care Provider.  Call for a follow up appointment with [facility] for further evaluation of your pain and pain medication. Return if Symptoms worsen.   Take medication as prescribed.

## 2013-05-12 NOTE — ED Notes (Signed)
He remains in no distress; and thanks [name] for our care.

## 2013-05-12 NOTE — ED Notes (Signed)
Bed: ZJ67 Expected date: 05/12/13 Expected time: 10:04 AM Means of arrival:  Comments: Chronic pain out of meds

## 2013-05-15 ENCOUNTER — Ambulatory Visit (INDEPENDENT_AMBULATORY_CARE_PROVIDER_SITE_OTHER): Payer: Medicare Other | Admitting: Family Medicine

## 2013-05-15 ENCOUNTER — Encounter: Payer: Self-pay | Admitting: Family Medicine

## 2013-05-15 VITALS — BP 152/93 | HR 75 | Temp 98.2°F | Resp 22

## 2013-05-15 DIAGNOSIS — G894 Chronic pain syndrome: Secondary | ICD-10-CM

## 2013-05-15 DIAGNOSIS — G35 Multiple sclerosis: Secondary | ICD-10-CM

## 2013-05-15 MED ORDER — FENTANYL 100 MCG/HR TD PT72: MEDICATED_PATCH | TRANSDERMAL | Status: DC

## 2013-05-15 NOTE — Assessment & Plan Note (Signed)
As per his request today, I have ordered referral to [facility]. I authorized #1 fentanyl 100 mcg patch to be dispensed today, and this will be combined with the leftover 100 mcg patch at his ALF to make one of his usual 200 mcg q72h doses.  At the end of this 72 hour period, his fentanyl will be d/c'd and I have notified his ALF that I will no longer rx any pain med for pt.

## 2013-05-15 NOTE — Progress Notes (Signed)
Pre visit review using our clinic review tool, if applicable. No additional management support is needed unless otherwise documented below in the visit note. 

## 2013-05-15 NOTE — Assessment & Plan Note (Signed)
Has initial appt at [facility] neuro 05/24/13.

## 2013-05-15 NOTE — Progress Notes (Signed)
OFFICE NOTE  05/15/2013  CC:  Chief Complaint  Patient presents with  . Follow-up  . Referral    new pain management     HPI: Patient is a 63 y.o. Caucasian male who is here for pain mgmt problems. Says he was released from his current pain mgmt provider ([facility]) yesterday, unclear circumstances but basically he says he stopped taking his methadone as he was instructed to but it kept on showing up on UDS there.   He has gone to the ED on two occasions in the last week just to get a dose of pain med.  He says he got sick of "their incompetence" at [place] center and "insisted that I be released from their care".  He asks for referral to [facility] today. I told him that I would make the referral but I declined to fill any pain med rx's for him. He asked if I would authorize one more fentanyl 100 mcg patch to go with a leftover patch (from a previous rx of mine) at his ALF so that he could get one more 200 mcg dose to last the next 72h.  I agreed to do this, but nothing further.        He says that he had to reschedule his initial [place] neuro visit b/c he was held over at an appt at [facility] pain mgmt on the original day he was supposed to see neurology.  This looks to be reset for 05/24/13.  Pertinent PMH:  Past Medical History  Diagnosis Date  . Fibromyalgia   . Multiple sclerosis     with optic neuritis  . Narcolepsy   . Scoliosis   . Hypothyroidism   . HLD (hyperlipidemia)   . Anemia of chronic disease   . Thrombocytopenia   . Pulmonary embolism     "suspected; never confirmed"--? treated with coumadin  . COPD (chronic obstructive pulmonary disease)   . Insomnia   . Carpal tunnel syndrome   . GERD (gastroesophageal reflux disease)   . BPH (benign prostatic hypertrophy)   . History of gout   . Neurogenic bladder     chronic indwelling foley catheter-changed monthly  . Optic neuritis   . History of epistaxis   . History of MRSA infection  2012    UTI, bacteremia, and endocarditis  . Endocarditis 2012  . Supraspinatus tendon tear 2012  . Heart murmur   . Myocardial infarction     "according to stress test; that's news to me"  . Exertional dyspnea   . OSA (obstructive sleep apnea)     "tried CPAP; lost more sleep w/use"  . History of blood transfusion     "gave me too much coumadin and heparin once"  . H/O hiatal hernia   . Osteoarthritis of multiple joints     "everywhere"  . DDD (degenerative disc disease)     knees, hips, shoulders--Dr. Ronnie Derby [facility] prior to this)  . CAD (coronary artery disease) 11/2011    Stents x 3  . Anxiety and depression   . Steroid-induced psychosis   . History of renal failure 2011    "on dialysis 3 times over 2 years"   Past Surgical History  Procedure Laterality Date  . Carpal tunnel release  ~ 2010    left with radius fracture repair  . Nasal cautery    . Skin graft  04/2009    right thigh to right "wrist all the way to  my shoulder; necrotizing fasciitis  . Colonoscopy  2000; 2005; 03/15/2011    diminutve adenoma--recall 2017  . Coronary angioplasty  11/22/2011    3 vessels  . Coronary angioplasty with stent placement  11/22/2011    DES to LAD  . Knee arthroscopy w/ meniscal repair  1990's    right    MEDS:  Outpatient Prescriptions Prior to Visit  Medication Sig Dispense Refill  . aspirin 81 MG chewable tablet Chew 81 mg by mouth daily.      . busPIRone (BUSPAR) 15 MG tablet 15 mg 2 (two) times daily.       . carvedilol (COREG) 6.25 MG tablet Take 6.25 mg by mouth 2 (two) times daily with a meal.      . clonazePAM (KLONOPIN) 0.5 MG tablet Take 1 tablet (0.5 mg total) by mouth 2 (two) times daily as needed for anxiety.  24 tablet  0  . cloNIDine (CATAPRES - DOSED IN MG/24 HR) 0.1 mg/24hr patch Place 0.1 mg onto the skin once a week.       . DULoxetine (CYMBALTA) 60 MG capsule Take 60 mg by mouth 2 (two) times daily.      . Fluticasone-Salmeterol (ADVAIR) 250-50 MCG/DOSE AEPB  Inhale 1 puff into the lungs every 12 (twelve) hours.      . Multiple Vitamins-Minerals (THEREMS-M) TABS Take 1 tablet by mouth daily.      . vitamin C (ASCORBIC ACID) 500 MG tablet Take 1,000 mg by mouth 2 (two) times daily.       Marland Kitchen acetaminophen (TYLENOL) 500 MG tablet Take 500 mg by mouth every 4 (four) hours as needed for mild pain, fever or headache.      . albuterol (PROVENTIL HFA;VENTOLIN HFA) 108 (90 BASE) MCG/ACT inhaler Inhale 2 puffs into the lungs every 4 (four) hours as needed. For shortness of breath      . Armodafinil (NUVIGIL) 250 MG tablet Take 250 mg by mouth daily.      . calcium acetate, Phos Binder, (PHOSLYRA) 667 MG/5ML SOLN Take 667 mg by mouth daily as needed (For indigestion).       . clonazePAM (KLONOPIN) 0.5 MG tablet Take 0.25 mg by mouth at bedtime. 0.25mg  qhs and 0.25mg  every 12 hours as needed for anxiety      . fentaNYL (DURAGESIC - DOSED MCG/HR) 100 MCG/HR Place 200 mcg onto the skin every 3 (three) days.      Marland Kitchen guaifenesin (ROBITUSSIN) 100 MG/5ML syrup Take 200 mg by mouth every 6 (six) hours as needed for cough.      Marland Kitchen HYDROcodone-acetaminophen (NORCO/VICODIN) 5-325 MG per tablet Take 1 tablet by mouth every 4 (four) hours as needed for moderate pain.      Marland Kitchen loperamide (IMODIUM) 2 MG capsule Take 2 mg by mouth as needed for diarrhea or loose stools (As needed with each loose stool for diarrhea).      . magnesium hydroxide (MILK OF MAGNESIA) 400 MG/5ML suspension Take 30 mLs by mouth at bedtime as needed for mild constipation.      . nitroGLYCERIN (NITROSTAT) 0.3 MG SL tablet Place 0.4 mg under the tongue every 5 (five) minutes as needed for chest pain.       No facility-administered medications prior to visit.    PE: Blood pressure 152/93, pulse 75, temperature 98.2 F (36.8 C), temperature source Temporal, resp. rate 22, SpO2 99.00%. Gen: Alert, well appearing, sitting back in his wheelchair.  Patient is oriented to person, place, time, and situation.  No  further exam today.  IMPRESSION AND PLAN:  Chronic pain syndrome As per his request today, I have ordered referral to [facility]. I authorized #1 fentanyl 100 mcg patch to be dispensed today, and this will be combined with the leftover 100 mcg patch at his ALF to make one of his usual 200 mcg q72h doses.  At the end of this 72 hour period, his fentanyl will be d/c'd and I have notified his ALF that I will no longer rx any pain med for pt.  Multiple sclerosis Has initial appt at [facility] neuro 05/24/13.  An After Visit Summary was printed and given to the patient.  Spent 25 min with pt today, with >50% of this time spent in counseling and care coordination regarding the above problems.  FOLLOW UP: prn

## 2013-05-16 ENCOUNTER — Telehealth: Payer: Self-pay | Admitting: Family Medicine

## 2013-05-16 ENCOUNTER — Emergency Department (INDEPENDENT_AMBULATORY_CARE_PROVIDER_SITE_OTHER)
Admission: EM | Admit: 2013-05-16 | Discharge: 2013-05-16 | Disposition: A | Discharge status: Home / Self Care | Payer: Medicare Other | Source: Home / Self Care | Attending: Emergency Medicine | Admitting: Emergency Medicine

## 2013-05-16 ENCOUNTER — Encounter (HOSPITAL_COMMUNITY): Payer: Self-pay | Admitting: Emergency Medicine

## 2013-05-16 DIAGNOSIS — G8929 Other chronic pain: Secondary | ICD-10-CM

## 2013-05-16 MED ORDER — HYDROCODONE-ACETAMINOPHEN 5-325 MG PO TABS: ORAL_TABLET | ORAL | Status: DC

## 2013-05-16 NOTE — Telephone Encounter (Signed)
Patient forgot to ask about his knee during his appt. He has been advised that surgery will not help him. Patient would like to know if there is a skilled nursing facility that Dr. Anitra Lauth recommends that would do intensive physical therapy for him. He would like to not have to use his wheelchair all of the time.

## 2013-05-16 NOTE — ED Notes (Signed)
[place] which is a resident Glass blower/designer for [facility] called to let us know that the above patient was a resident in there facility States patient is here seeking for pain medication States patient was on methadone 80mg  States he has been discharged from a PCP and pain management for non compliance States new PCP is not giving patient any pain meds either

## 2013-05-16 NOTE — ED Provider Notes (Signed)
Chief Complaint   Chief Complaint  Patient presents with  . Medication Refill    History of Present Illness   Dale Shields is an unfortunate 63 year old male who is living at an assisted living center. He has a long-standing history of chronic pain in the knees, hips, and shoulders. Because of his chronic pain is been maintained on methadone and fentanyl patches. He stopped the methadone on his own and continues to use the fentanyl patches. He's taking 200 mg per day. He was being followed by Dr. Alyson Ingles as his primary care physician and also at a pain clinic. He was discharged from both Dr. Shawnie Dapper and the [facility] because of noncompliance. Right now he does not have a primary care doctor who can prescribe his pain medication. He has an appointment at [facility] neurology on February 13. He's under the impression that they will start taking care of his pain at that time. He has a history of multiple sclerosis, fibromyalgia, and many other medical problems. He had a severe episode of necrotizing fasciitis with necrosis of his skin on his right arm and chest which resulted in many months in the hospital and skin grafts. He comes in today asking for a refill on his fentanyl patches. He states he can't do without them and tends to go into withdrawal. He also has history of COPD, osteoporosis, narcolepsy, degenerative disc disease, and chronic kidney disease.  Review of Systems     Other than as noted above, the patient denies any of the following symptoms: Systemic:  No fever, chills, sweats, fatigue, myalgias, headache, or anorexia. Eye:  No redness, pain or drainage. ENT:  No earache, nasal congestion, rhinorrhea, sinus pressure, or sore throat. Lungs:  No cough, sputum production, wheezing, shortness of breath.  Cardiovascular:  No chest pain, palpitations, or syncope. GI:  No nausea, vomiting, abdominal pain or diarrhea. GU:  No dysuria, frequency, or hematuria. Skin:  No  rash or pruritis.   [place]     Past medical history, family history, social history, meds, and allergies were reviewed.  He's allergic to Ultram. Current meds include acetaminophen, albuterol, Nuvigil, aspirin, BuSpar, carvedilol, clonazepam, clonidine, Aricept, Cymbalta, fentanyl, Advair, Xopenex, Imodium, and nitroglycerin. Current medical problems include fibromyalgia, multiple sclerosis with optic neuritis, narcolepsy, hypothyroidism, anemia chronic disease, thrombocytopenia, pulmonary embolism, COPD, gastroesophageal reflux, benign prostatic hypertrophy, gout, neurogenic bladder with a chronic indwelling Foley catheter, optic neuritis, endocarditis, supraspinatus tendon tear, myocardial infarction, sleep apnea, osteoarthritis, degenerative disc disease, anxiety and depression, and renal failure.  Physical Examination    Vital signs:  BP 158/83  Pulse 52  Temp(Src) 98.1 F (36.7 C) (Oral)  Resp 18  SpO2 96% General:  Alert, in no distress. Eye:  PERRL, full EOMs.  Lids and conjunctivas were normal. ENT:  TMs and canals were normal, without erythema or inflammation.  Nasal mucosa was clear and uncongested, without drainage.  Mucous membranes were moist.  Pharynx was clear, without exudate or drainage.  There were no oral ulcerations or lesions. Neck:  Supple, no adenopathy, tenderness or mass. Thyroid was normal. Lungs:  No respiratory distress.  Lungs were clear to auscultation, without wheezes, rales or rhonchi.  Breath sounds were clear and equal bilaterally. Heart:  Regular rhythm, without gallops, murmers or rubs. Abdomen:  Soft, flat, and non-tender to palpation.  No hepatosplenomagaly or mass. Skin:  Clear, warm, and dry, without rash or lesions. He has extensive skin grafts on his entire right arm, shoulder, and chest from  his necrotizing fasciitis.   Assessment   The encounter diagnosis was Chronic pain.  I told him that we would not give him refills on his fentanyl patches.  Upon hearing this he became very angry and irritable. I did offer him a small prescription for hydrocodone to keep him from going through withdrawal. He will have to make this last until his appointment with the neurologist. I told we would not give him any further opioid pain medication here at the [facility].  Plan     1.  Meds:  The following meds were prescribed:   Discharge Medication List as of 05/16/2013  1:47 PM    START taking these medications   Details  HYDROcodone-acetaminophen (NORCO/VICODIN) 5-325 MG per tablet 1 to 2 tabs every 4 to 6 hours as needed for pain., Print        2.  Patient Education/Counseling:  The patient was given appropriate handouts, self care instructions, and instructed in symptomatic relief.    3.  Follow up:  The patient was told to follow up with [facility] neurology on the 13th.        Harden Mo, MD 05/16/13 [phone]

## 2013-05-16 NOTE — Discharge Instructions (Signed)
Chronic Pain Discharge Instructions  °Emergency care providers appreciate that many patients coming to us are in severe pain and we wish to address their pain in the safest, most responsible manner.  It is important to recognize however, that the proper treatment of chronic pain differs from that of the pain of injuries and acute illnesses.  Our goal is to provide quality, safe, personalized care and we thank you for giving us the opportunity to serve you. °The use of narcotics and related agents for chronic pain syndromes may lead to additional physical and psychological problems.  Nearly as many people die from prescription narcotics each year as die from car crashes.  Additionally, this risk is increased if such prescriptions are obtained from a variety of sources.  Therefore, only your primary care physician or a pain management specialist is able to safely treat such syndromes with narcotic medications long-term.   ° °Documentation revealing such prescriptions have been sought from multiple sources may prohibit us from providing a refill or different narcotic medication.  Your name may be checked first through the [place] Controlled Substances Reporting System.  This database is a record of controlled substance medication prescriptions that the patient has received.  This has been established by [facility] in an effort to eliminate the dangerous, and often life threatening, practice of obtaining multiple prescriptions from different medical providers.  ° °If you have a chronic pain syndrome (i.e. chronic headaches, recurrent back or neck pain, dental pain, abdominal or pelvis pain without a specific diagnosis, or neuropathic pain such as fibromyalgia) or recurrent visits for the same condition without an acute diagnosis, you may be treated with non-narcotics and other non-addictive medicines.  Allergic reactions or negative side effects that may be reported by a patient to such medications will not  typically lead to the use of a narcotic analgesic or other controlled substance as an alternative. °  °Patients managing chronic pain with a personal physician should have provisions in place for breakthrough pain.  If you are in crisis, you should call your physician.  If your physician directs you to the emergency department, please have the doctor call and speak to our attending physician concerning your care. °  °When patients come to the Emergency Department (ED) with acute medical conditions in which the Emergency Department physician feels appropriate to prescribe narcotic or sedating pain medication, the physician will prescribe these in very limited quantities.  The amount of these medications will last only until you can see your primary care physician in his/her office.  Any patient who returns to the ED seeking refills should expect only non-narcotic pain medications.  ° °In the event of an acute medical condition exists and the emergency physician feels it is necessary that the patient be given a narcotic or sedating medication -  a responsible adult driver should be present in the room prior to the medication being given by the nurse. °  °Prescriptions for narcotic or sedating medications that have been lost, stolen or expired will not be refilled in the Emergency Department.   ° °Patients who have chronic pain may receive non-narcotic prescriptions until seen by their primary care physician.  It is every patient’s personal responsibility to maintain active prescriptions with his or her primary care physician or specialist. °

## 2013-05-16 NOTE — ED Notes (Signed)
See previous note from [place], [place] Needing refill on his Fentanyl patches Hx of chronic pain No PCP... Has appt w/nuerologist next week and needing enough meds till then Alert w/no signs of acute distress.

## 2013-05-17 ENCOUNTER — Telehealth: Payer: Self-pay | Admitting: Family Medicine

## 2013-05-17 NOTE — Telephone Encounter (Signed)
Relevant patient education mailed to patient.  

## 2013-05-17 NOTE — ED Notes (Signed)
Call from pharmacy, asking for clarification of orders on pain medicine that do not offer a variance . Per Dr Jake Michaelis, pt may use 1 pill q 6 hours, signed copy of said changes faxed to pharmacist at their request

## 2013-05-18 ENCOUNTER — Encounter (HOSPITAL_COMMUNITY): Payer: Self-pay | Admitting: Emergency Medicine

## 2013-05-18 ENCOUNTER — Emergency Department (HOSPITAL_COMMUNITY)
Admission: EM | Admit: 2013-05-18 | Discharge: 2013-05-18 | Disposition: A | Discharge status: Skilled Nursing Facility | Payer: Medicare Other | Attending: Emergency Medicine | Admitting: Emergency Medicine

## 2013-05-18 DIAGNOSIS — G4733 Obstructive sleep apnea (adult) (pediatric): Secondary | ICD-10-CM | POA: Insufficient documentation

## 2013-05-18 DIAGNOSIS — F341 Dysthymic disorder: Secondary | ICD-10-CM | POA: Insufficient documentation

## 2013-05-18 DIAGNOSIS — I2510 Atherosclerotic heart disease of native coronary artery without angina pectoris: Secondary | ICD-10-CM | POA: Insufficient documentation

## 2013-05-18 DIAGNOSIS — I252 Old myocardial infarction: Secondary | ICD-10-CM | POA: Insufficient documentation

## 2013-05-18 DIAGNOSIS — Z8639 Personal history of other endocrine, nutritional and metabolic disease: Secondary | ICD-10-CM | POA: Insufficient documentation

## 2013-05-18 DIAGNOSIS — Z8614 Personal history of Methicillin resistant Staphylococcus aureus infection: Secondary | ICD-10-CM | POA: Insufficient documentation

## 2013-05-18 DIAGNOSIS — J449 Chronic obstructive pulmonary disease, unspecified: Secondary | ICD-10-CM | POA: Insufficient documentation

## 2013-05-18 DIAGNOSIS — R011 Cardiac murmur, unspecified: Secondary | ICD-10-CM | POA: Insufficient documentation

## 2013-05-18 DIAGNOSIS — H538 Other visual disturbances: Secondary | ICD-10-CM | POA: Insufficient documentation

## 2013-05-18 DIAGNOSIS — Z8669 Personal history of other diseases of the nervous system and sense organs: Secondary | ICD-10-CM | POA: Insufficient documentation

## 2013-05-18 DIAGNOSIS — Z862 Personal history of diseases of the blood and blood-forming organs and certain disorders involving the immune mechanism: Secondary | ICD-10-CM | POA: Insufficient documentation

## 2013-05-18 DIAGNOSIS — Z86711 Personal history of pulmonary embolism: Secondary | ICD-10-CM | POA: Insufficient documentation

## 2013-05-18 DIAGNOSIS — IMO0001 Reserved for inherently not codable concepts without codable children: Secondary | ICD-10-CM | POA: Insufficient documentation

## 2013-05-18 DIAGNOSIS — D638 Anemia in other chronic diseases classified elsewhere: Secondary | ICD-10-CM | POA: Insufficient documentation

## 2013-05-18 DIAGNOSIS — N186 End stage renal disease: Secondary | ICD-10-CM | POA: Insufficient documentation

## 2013-05-18 DIAGNOSIS — Z79899 Other long term (current) drug therapy: Secondary | ICD-10-CM | POA: Insufficient documentation

## 2013-05-18 DIAGNOSIS — F17200 Nicotine dependence, unspecified, uncomplicated: Secondary | ICD-10-CM | POA: Insufficient documentation

## 2013-05-18 DIAGNOSIS — K219 Gastro-esophageal reflux disease without esophagitis: Secondary | ICD-10-CM | POA: Insufficient documentation

## 2013-05-18 DIAGNOSIS — Z9861 Coronary angioplasty status: Secondary | ICD-10-CM | POA: Insufficient documentation

## 2013-05-18 DIAGNOSIS — M1990 Unspecified osteoarthritis, unspecified site: Secondary | ICD-10-CM | POA: Insufficient documentation

## 2013-05-18 DIAGNOSIS — Z87448 Personal history of other diseases of urinary system: Secondary | ICD-10-CM | POA: Insufficient documentation

## 2013-05-18 DIAGNOSIS — Z7982 Long term (current) use of aspirin: Secondary | ICD-10-CM | POA: Insufficient documentation

## 2013-05-18 DIAGNOSIS — J4489 Other specified chronic obstructive pulmonary disease: Secondary | ICD-10-CM | POA: Insufficient documentation

## 2013-05-18 DIAGNOSIS — Z992 Dependence on renal dialysis: Secondary | ICD-10-CM | POA: Insufficient documentation

## 2013-05-18 DIAGNOSIS — I120 Hypertensive chronic kidney disease with stage 5 chronic kidney disease or end stage renal disease: Secondary | ICD-10-CM | POA: Insufficient documentation

## 2013-05-18 NOTE — ED Notes (Signed)
Per EMS: Pt from [name].  Pt has a problem with taking too much of his medicine.  Goes to a pain clinic and is not helped there because he tests positive for methadone.  States hx of MS.  States that he goes through periods of blurriness in eyes.  Pt c/o rt blurriness since this morning.

## 2013-05-18 NOTE — ED Notes (Signed)
Dr Allen at bedside  

## 2013-05-18 NOTE — ED Notes (Signed)
Pt is from the facility [facility])on general body assessment, Pt has a foley catheter, attached to left leg, says he get it changed once month. No skin compromise noted. Pt is resting, no apparent signs of distress.

## 2013-05-18 NOTE — ED Provider Notes (Addendum)
CSN: [id number]     Arrival date & time 05/18/13  1515 History   First MD Initiated Contact with Patient 05/18/13 1534     Chief Complaint  Patient presents with  . Eye Problem   (Consider location/radiation/quality/duration/timing/severity/associated sxs/prior Treatment) Patient is a 63 y.o. male presenting with eye problem. The history is provided by the patient.  Eye Problem pt here with right eye blurred vision x 1 day similar to his prior MS exacerbations--no new focal weakness--sx persistent and started after pts pain meds were changed--not currently using any steroids--denies visual loss, change in speech, or ataxia--no tx used for this pta  Past Medical History  Diagnosis Date  . Fibromyalgia   . Multiple sclerosis     with optic neuritis  . Narcolepsy   . Scoliosis   . Hypothyroidism   . HLD (hyperlipidemia)   . Anemia of chronic disease   . Thrombocytopenia   . Pulmonary embolism     "suspected; never confirmed"--? treated with coumadin  . COPD (chronic obstructive pulmonary disease)   . Insomnia   . Carpal tunnel syndrome   . GERD (gastroesophageal reflux disease)   . BPH (benign prostatic hypertrophy)   . History of gout   . Neurogenic bladder     chronic indwelling foley catheter-changed monthly  . Optic neuritis   . History of epistaxis   . History of MRSA infection 2012    UTI, bacteremia, and endocarditis  . Endocarditis 2012  . Supraspinatus tendon tear 2012  . Heart murmur   . Myocardial infarction     "according to stress test; that's news to me"  . Exertional dyspnea   . OSA (obstructive sleep apnea)     "tried CPAP; lost more sleep w/use"  . History of blood transfusion     "gave me too much coumadin and heparin once"  . H/O hiatal hernia   . Osteoarthritis of multiple joints     "everywhere"  . DDD (degenerative disc disease)     knees, hips, shoulders--Dr. Ronnie Derby [facility] prior to this)  . CAD (coronary artery disease) 11/2011    Stents x 3  .  Anxiety and depression   . Steroid-induced psychosis   . History of renal failure 2011    "on dialysis 3 times over 2 years"   Past Surgical History  Procedure Laterality Date  . Carpal tunnel release  ~ 2010    left with radius fracture repair  . Nasal cautery    . Skin graft  04/2009    right thigh to right "wrist all the way to my shoulder; necrotizing fasciitis  . Colonoscopy  2000; 2005; 03/15/2011    diminutve adenoma--recall 2017  . Coronary angioplasty  11/22/2011    3 vessels  . Coronary angioplasty with stent placement  11/22/2011    DES to LAD  . Knee arthroscopy w/ meniscal repair  29's    right   Family History  Problem Relation Age of Onset  . Alzheimer's disease Mother   . Colon cancer Neg Hx    History  Substance Use Topics  . Smoking status: Current Every Day Smoker -- 1.00 packs/day for 40 years    Types: Cigarettes  . Smokeless tobacco: Never Used     Comment: Counseling sheet to quit smoking given in exam room   . Alcohol Use: Yes     Comment: 11/22/2011 "like crazy til 1988; nothing since"    Review of Systems  All other systems reviewed and are  negative.    Allergies  Ultram  Home Medications   Current Outpatient Rx  Name  Route  Sig  Dispense  Refill  . acetaminophen (TYLENOL) 500 MG tablet   Oral   Take 500 mg by mouth every 4 (four) hours as needed for mild pain, fever or headache.         . albuterol (PROVENTIL HFA;VENTOLIN HFA) 108 (90 BASE) MCG/ACT inhaler   Inhalation   Inhale 2 puffs into the lungs every 4 (four) hours as needed. For shortness of breath         . Armodafinil (NUVIGIL) 250 MG tablet   Oral   Take 250 mg by mouth daily.         Marland Kitchen aspirin 81 MG chewable tablet   Oral   Chew 81 mg by mouth daily.         . busPIRone (BUSPAR) 15 MG tablet      15 mg 2 (two) times daily.          . calcium acetate, Phos Binder, (PHOSLYRA) 667 MG/5ML SOLN   Oral   Take 667 mg by mouth daily as needed (For  indigestion).          . carvedilol (COREG) 6.25 MG tablet   Oral   Take 6.25 mg by mouth 2 (two) times daily with a meal.         . cloNIDine (CATAPRES - DOSED IN MG/24 HR) 0.1 mg/24hr patch   Transdermal   Place 0.1 mg onto the skin once a week.          . DULoxetine (CYMBALTA) 60 MG capsule   Oral   Take 60 mg by mouth 2 (two) times daily.         . Fluticasone-Salmeterol (ADVAIR) 250-50 MCG/DOSE AEPB   Inhalation   Inhale 1 puff into the lungs every 12 (twelve) hours.         Marland Kitchen guaifenesin (ROBITUSSIN) 100 MG/5ML syrup   Oral   Take 200 mg by mouth every 6 (six) hours as needed for cough.         Marland Kitchen HYDROcodone-acetaminophen (NORCO/VICODIN) 5-325 MG per tablet      1 to 2 tabs every 4 to 6 hours as needed for pain.   12 tablet   0   . loperamide (IMODIUM) 2 MG capsule   Oral   Take 2 mg by mouth as needed for diarrhea or loose stools (As needed with each loose stool for diarrhea).         . magnesium hydroxide (MILK OF MAGNESIA) 400 MG/5ML suspension   Oral   Take 30 mLs by mouth at bedtime as needed for mild constipation.         . Multiple Vitamins-Minerals (THEREMS-M) TABS   Oral   Take 1 tablet by mouth daily.         . nitroGLYCERIN (NITROSTAT) 0.3 MG SL tablet   Sublingual   Place 0.4 mg under the tongue every 5 (five) minutes as needed for chest pain.         . vitamin C (ASCORBIC ACID) 500 MG tablet   Oral   Take 1,000 mg by mouth 2 (two) times daily.           BP 158/99  Pulse 77  Temp(Src) 98.2 F (36.8 C) (Oral)  Resp 18  SpO2 95% Physical Exam  Nursing note and vitals reviewed. Constitutional: He is oriented to person, place, and time. He appears  well-developed and well-nourished.  Non-toxic appearance. No distress.  HENT:  Head: Normocephalic and atraumatic.  Eyes: Conjunctivae, EOM and lids are normal. Pupils are equal, round, and reactive to light.  Neck: Normal range of motion. Neck supple. No tracheal deviation  present. No mass present.  Cardiovascular: Normal rate, regular rhythm and normal heart sounds.  Exam reveals no gallop.   No murmur heard. Pulmonary/Chest: Effort normal and breath sounds normal. No stridor. No respiratory distress. He has no decreased breath sounds. He has no wheezes. He has no rhonchi. He has no rales.  Abdominal: Soft. Normal appearance and bowel sounds are normal. He exhibits no distension. There is no tenderness. There is no rebound and no CVA tenderness.  Musculoskeletal: Normal range of motion. He exhibits no edema and no tenderness.  Neurological: He is alert and oriented to person, place, and time. He has normal strength. No cranial nerve deficit or sensory deficit. Coordination abnormal. GCS eye subscore is 4. GCS verbal subscore is 5. GCS motor subscore is 6.  Pt has a baseline limp  Skin: Skin is warm and dry. No abrasion and no rash noted.  Psychiatric: He has a normal mood and affect. His speech is normal and behavior is normal.    ED Course  Procedures (including critical care time) Labs Review Labs Reviewed - No data to display Imaging Review No results found.  EKG Interpretation   None      Date: 05/18/2013  Rate: 72  Rhythm: normal sinus rhythm  QRS Axis: normal  Intervals: normal  ST/T Wave abnormalities: normal  Conduction Disutrbances:none  Narrative Interpretation:   Old EKG Reviewed: none available    MDM  No diagnosis found. pts visual acuity noted, no evidence of an afferent pupillary light defect, spoke with neurology, dr Nicole Kindred, reccomends no steroids at this time--pt will need to f/u his pcp  Pt relates to me that he feels that his current sx are related to the stress from not having pain medications and is requesting a refill--old records reviewed and pt recently dismissed from his pain clinic--pt informed that he would not be getting opiates here and that he would need to f/u with his pcp    [name], MD 05/18/13  1605  [name], MD 05/18/13 [phone]

## 2013-05-18 NOTE — ED Notes (Signed)
Spoke with [place] @ answering service. dt

## 2013-05-18 NOTE — Discharge Instructions (Signed)
Follow up with your neurologist next week Blurred Vision You have been seen today complaining of blurred vision. This means you have a loss of ability to see small details.  CAUSES  Blurred vision can be a symptom of underlying eye problems, such as:  Aging of the eye (presbyopia).  Glaucoma.  Cataracts.  Eye infection.  Eye-related migraine.  Diabetes mellitus.  Fatigue.  Migraine headaches.  High blood pressure.  Breakdown of the back of the eye (macular degeneration).  Problems caused by some medications. The most common cause of blurred vision is the need for eyeglasses or a new prescription. Today in the emergency department, no cause for your blurred vision can be found. SYMPTOMS  Blurred vision is the loss of visual sharpness and detail (acuity). DIAGNOSIS  Should blurred vision continue, you should see your caregiver. If your caregiver is your primary care physician, he or she may choose to refer you to another specialist.  TREATMENT  Do not ignore your blurred vision. Make sure to have it checked out to see if further treatment or referral is necessary. SEEK MEDICAL CARE IF:  You are unable to get into a specialist so we can help you with a referral. SEEK IMMEDIATE MEDICAL CARE IF: You have severe eye pain, severe headache, or sudden loss of vision. MAKE SURE YOU:   Understand these instructions.  Will watch your condition.  Will get help right away if you are not doing well or get worse. Document Released: 03/31/2003 Document Revised: 06/20/2011 Document Reviewed: 10/31/2007 [facility] Patient Information 2014 [place].

## 2013-05-19 ENCOUNTER — Emergency Department (HOSPITAL_COMMUNITY)
Admission: EM | Admit: 2013-05-19 | Discharge: 2013-05-19 | Disposition: A | Discharge status: Home / Self Care | Payer: Medicare Other | Attending: Emergency Medicine | Admitting: Emergency Medicine

## 2013-05-19 DIAGNOSIS — F911 Conduct disorder, childhood-onset type: Secondary | ICD-10-CM | POA: Insufficient documentation

## 2013-05-19 DIAGNOSIS — Z87448 Personal history of other diseases of urinary system: Secondary | ICD-10-CM | POA: Insufficient documentation

## 2013-05-19 DIAGNOSIS — Z87440 Personal history of urinary (tract) infections: Secondary | ICD-10-CM | POA: Insufficient documentation

## 2013-05-19 DIAGNOSIS — J4489 Other specified chronic obstructive pulmonary disease: Secondary | ICD-10-CM | POA: Insufficient documentation

## 2013-05-19 DIAGNOSIS — G47419 Narcolepsy without cataplexy: Secondary | ICD-10-CM | POA: Insufficient documentation

## 2013-05-19 DIAGNOSIS — M4120 Other idiopathic scoliosis, site unspecified: Secondary | ICD-10-CM | POA: Insufficient documentation

## 2013-05-19 DIAGNOSIS — G4700 Insomnia, unspecified: Secondary | ICD-10-CM | POA: Insufficient documentation

## 2013-05-19 DIAGNOSIS — N186 End stage renal disease: Secondary | ICD-10-CM | POA: Insufficient documentation

## 2013-05-19 DIAGNOSIS — Z8639 Personal history of other endocrine, nutritional and metabolic disease: Secondary | ICD-10-CM | POA: Insufficient documentation

## 2013-05-19 DIAGNOSIS — M25519 Pain in unspecified shoulder: Secondary | ICD-10-CM | POA: Insufficient documentation

## 2013-05-19 DIAGNOSIS — M545 Low back pain, unspecified: Secondary | ICD-10-CM | POA: Insufficient documentation

## 2013-05-19 DIAGNOSIS — F411 Generalized anxiety disorder: Secondary | ICD-10-CM | POA: Insufficient documentation

## 2013-05-19 DIAGNOSIS — I2510 Atherosclerotic heart disease of native coronary artery without angina pectoris: Secondary | ICD-10-CM | POA: Insufficient documentation

## 2013-05-19 DIAGNOSIS — IMO0002 Reserved for concepts with insufficient information to code with codable children: Secondary | ICD-10-CM | POA: Insufficient documentation

## 2013-05-19 DIAGNOSIS — Z862 Personal history of diseases of the blood and blood-forming organs and certain disorders involving the immune mechanism: Secondary | ICD-10-CM | POA: Insufficient documentation

## 2013-05-19 DIAGNOSIS — I252 Old myocardial infarction: Secondary | ICD-10-CM | POA: Insufficient documentation

## 2013-05-19 DIAGNOSIS — Z86711 Personal history of pulmonary embolism: Secondary | ICD-10-CM | POA: Insufficient documentation

## 2013-05-19 DIAGNOSIS — Z8719 Personal history of other diseases of the digestive system: Secondary | ICD-10-CM | POA: Insufficient documentation

## 2013-05-19 DIAGNOSIS — Z8669 Personal history of other diseases of the nervous system and sense organs: Secondary | ICD-10-CM | POA: Insufficient documentation

## 2013-05-19 DIAGNOSIS — Z79899 Other long term (current) drug therapy: Secondary | ICD-10-CM | POA: Insufficient documentation

## 2013-05-19 DIAGNOSIS — F3289 Other specified depressive episodes: Secondary | ICD-10-CM | POA: Insufficient documentation

## 2013-05-19 DIAGNOSIS — G35 Multiple sclerosis: Secondary | ICD-10-CM

## 2013-05-19 DIAGNOSIS — F329 Major depressive disorder, single episode, unspecified: Secondary | ICD-10-CM | POA: Insufficient documentation

## 2013-05-19 DIAGNOSIS — G8929 Other chronic pain: Secondary | ICD-10-CM | POA: Insufficient documentation

## 2013-05-19 DIAGNOSIS — G4733 Obstructive sleep apnea (adult) (pediatric): Secondary | ICD-10-CM | POA: Insufficient documentation

## 2013-05-19 DIAGNOSIS — M25569 Pain in unspecified knee: Secondary | ICD-10-CM | POA: Insufficient documentation

## 2013-05-19 DIAGNOSIS — Z9861 Coronary angioplasty status: Secondary | ICD-10-CM | POA: Insufficient documentation

## 2013-05-19 DIAGNOSIS — Z8614 Personal history of Methicillin resistant Staphylococcus aureus infection: Secondary | ICD-10-CM | POA: Insufficient documentation

## 2013-05-19 DIAGNOSIS — F17200 Nicotine dependence, unspecified, uncomplicated: Secondary | ICD-10-CM | POA: Insufficient documentation

## 2013-05-19 DIAGNOSIS — IMO0001 Reserved for inherently not codable concepts without codable children: Secondary | ICD-10-CM | POA: Insufficient documentation

## 2013-05-19 DIAGNOSIS — Z96659 Presence of unspecified artificial knee joint: Secondary | ICD-10-CM | POA: Insufficient documentation

## 2013-05-19 DIAGNOSIS — R011 Cardiac murmur, unspecified: Secondary | ICD-10-CM | POA: Insufficient documentation

## 2013-05-19 DIAGNOSIS — H538 Other visual disturbances: Secondary | ICD-10-CM | POA: Insufficient documentation

## 2013-05-19 DIAGNOSIS — Z992 Dependence on renal dialysis: Secondary | ICD-10-CM | POA: Insufficient documentation

## 2013-05-19 DIAGNOSIS — M1990 Unspecified osteoarthritis, unspecified site: Secondary | ICD-10-CM | POA: Insufficient documentation

## 2013-05-19 DIAGNOSIS — Z7982 Long term (current) use of aspirin: Secondary | ICD-10-CM | POA: Insufficient documentation

## 2013-05-19 DIAGNOSIS — J449 Chronic obstructive pulmonary disease, unspecified: Secondary | ICD-10-CM | POA: Insufficient documentation

## 2013-05-19 DIAGNOSIS — F19988 Other psychoactive substance use, unspecified with other psychoactive substance-induced disorder: Secondary | ICD-10-CM | POA: Insufficient documentation

## 2013-05-19 MED ORDER — ONDANSETRON 8 MG PO TBDP
8.0000 mg | ORAL_TABLET | Freq: Once | ORAL | Status: AC
  Administered 2013-05-19: 8 mg via ORAL
  Filled 2013-05-19: qty 1

## 2013-05-19 NOTE — Discharge Instructions (Signed)
Chronic Pain Chronic pain can be defined as pain that is off and on and lasts for 3 6 months or longer. Many things cause chronic pain, which can make it difficult to make a diagnosis. There are many treatment options available for chronic pain. However, finding a treatment that works well for you may require trying various approaches until the right one is found. Many people benefit from a combination of two or more types of treatment to control their pain. SYMPTOMS  Chronic pain can occur anywhere in the body and can range from mild to very severe. Some types of chronic pain include:  Headache.  Low back pain.  Cancer pain.  Arthritis pain.  Neurogenic pain. This is pain resulting from damage to nerves. People with chronic pain may also have other symptoms such as:  Depression.  Anger.  Insomnia.  Anxiety. DIAGNOSIS  Your health care provider will help diagnose your condition over time. In many cases, the initial focus will be on excluding possible conditions that could be causing the pain. Depending on your symptoms, your health care provider may order tests to diagnose your condition. Some of these tests may include:   Blood tests.   CT scan.   MRI.   X-rays.   Ultrasounds.   Nerve conduction studies.  You may need to see a specialist.  TREATMENT  Finding treatment that works well may take time. You may be referred to a pain specialist. He or she may prescribe medicine or therapies, such as:   Mindful meditation or yoga.  Shots (injections) of numbing or pain-relieving medicines into the spine or area of pain.  Local electrical stimulation.  Acupuncture.   Massage therapy.   Aroma, color, light, or sound therapy.   Biofeedback.   Working with a physical therapist to keep from getting stiff.   Regular, gentle exercise.   Cognitive or behavioral therapy.   Group support.  Sometimes, surgery may be recommended.  HOME CARE INSTRUCTIONS    Take all medicines as directed by your health care provider.   Lessen stress in your life by relaxing and doing things such as listening to calming music.   Exercise or be active as directed by your health care provider.   Eat a healthy diet and include things such as vegetables, fruits, fish, and lean meats in your diet.   Keep all follow-up appointments with your health care provider.   Attend a support group with others suffering from chronic pain. SEEK MEDICAL CARE IF:   Your pain gets worse.   You develop a new pain that was not there before.   You cannot tolerate medicines given to you by your health care provider.   You have new symptoms since your last visit with your health care provider.  SEEK IMMEDIATE MEDICAL CARE IF:   You feel weak.   You have decreased sensation or numbness.   You lose control of bowel or bladder function.   Your pain suddenly gets much worse.   You develop shaking.  You develop chills.  You develop confusion.  You develop chest pain.  You develop shortness of breath.  MAKE SURE YOU:  Understand these instructions.  Will watch your condition.  Will get help right away if you are not doing well or get worse. Document Released: 12/18/2001 Document Revised: 11/28/2012 Document Reviewed: 09/21/2012 [facility] Patient Information 2014 [place].  Multiple Sclerosis Multiple sclerosis (MS) is a disease of the central nervous system. It leads to loss of the  insulating covering of the nerves (myelin sheath) of your brain. When this happens, brain signals do not get transmitted properly or may not get transmitted at all. The symptoms of MS occur in episodes or attacks. These attacks may last weeks to months. There may be long periods of nearly no problems between attacks. The age of onset of MS varies.  CAUSES The cause of MS is unknown. However, it is more common in the [place] than in the [place] the [place]. RISK FACTORS There is a higher incidence of MS in women than in men. MS is not an inherited illness, although your risk of MS is higher if you have a relative with MS. SIGNS AND SYMPTOMS  The symptoms of MS occur in episodes or attacks. These attacks may last weeks to months. There may be long periods of almost no symptoms between attacks. The symptoms of MS vary. This is because of the many different ways it affects the central nervous system. The main symptoms of MS include:  Vision problems and eye pain.  Numbness.  Weakness.  Paralysis in your arms, hands, feet, and legs (extremities).  Balance problems.  Tremors. DIAGNOSIS  Your health care provider can diagnose MS with the help of imaging exams and lab tests. These may include specialized X-ray exams and spinal fluid tests. The best imaging exam to confirm a diagnosis of MS is MRI. TREATMENT  There is no known cure for MS, but there are medicines that can decrease the number and frequency of attacks. Steroids are often used for short-term relief. Physical and occupational therapy may also help. HOME CARE INSTRUCTIONS   Take medicines as directed by your health care provider.  Exercise as directed by your health care provider. SEEK MEDICAL CARE IF: You begin to feel depressed. SEEK IMMEDIATE MEDICAL CARE IF:  You develop paralysis.  You develop problems with bladder, bowel, or sexual function.  You develop mental changes, such as forgetfulness or mood swings.  You have a seizure. Document Released: 03/25/2000 Document Revised: 01/16/2013 Document Reviewed: 12/03/2012 [facility] Patient Information 2014 [place].   Emergency Department Resource Guide 1) Find a Doctor and Pay Out of Pocket Although you won't have to find out who is covered by your insurance plan, it is a good idea to ask around and get recommendations. You will then need to call the office and see if the doctor you have chosen will accept  you as a new patient and what types of options they offer for patients who are self-pay. Some doctors offer discounts or will set up payment plans for their patients who do not have insurance, but you will need to ask so you aren't surprised when you get to your appointment.  2) Contact Your Local Health Department Not all health departments have doctors that can see patients for sick visits, but many do, so it is worth a call to see if yours does. If you don't know where your local health department is, you can check in your phone book. The CDC also has a tool to help you locate your state's health department, and many state websites also have listings of all of their local health departments.  3) Find a [facility] If your illness is not likely to be very severe or complicated, you may want to try a walk in clinic. These are popping up all over the country in pharmacies, drugstores, and shopping centers. They're usually staffed by nurse practitioners or physician assistants that have  been trained to treat common illnesses and complaints. They're usually fairly quick and inexpensive. However, if you have serious medical issues or chronic medical problems, these are probably not your best option.  No Primary Care Doctor: - Call Health Connect at  [phone] - they can help you locate a primary care doctor that  accepts your insurance, provides certain services, etc. - Physician Referral Service- [phone]  Chronic Pain Problems: Organization         Address  Phone   Notes  [facility]  [phone] Patients need to be referred by their primary care doctor.   Medication Assistance: Organization         Address  Phone   Notes  [facility] Medication [facility] [address] [place], [place] [address] [phone] --Must be a resident of [facility] -- Must have NO insurance coverage whatsoever (no Medicaid/ Medicare, etc.) -- The pt. MUST  have a primary care doctor that directs their care regularly and follows them in the community   [facility]ist  [phone]   [employer]  [phone]    Agencies that provide inexpensive medical care: Organization         Address  Phone   Notes  [place]  [phone]   [facility]    [phone]   [facility] [place],  [phone]   [place] [address], [place] [phone]   Planned Parenthood    [phone]   [facility]    [phone]   [place] and [address], [place] Phone:  [phone], Fax:  [phone] Hours of Operation:  9 am - 6 pm, M-F.  Also accepts Medicaid/Medicare and self-pay.  [facility] for [place], [address], [place] Phone: [phone], Fax: [phone]. Hours of Operation:  8:30 am - 5:30 pm, M-F.  Also accepts Medicaid and self-pay.  [facility] [address], [place] Phone: [phone]   [place], [place], [place] [phone], Ext. [phone] Mondays & Thursdays: 7-9 AM.  First 15 patients are seen on a first come, first serve basis.    [place] Providers:  Organization         Address  Phone   Notes  [facility] [address], [place] [phone] Also accepts self-pay patients.  [facility] [address], [place]  [phone]   [place], [address], [place] [phone]   [facility] [address], [place] [phone]   Lucianne Lei [address], [place]   [phone] Only accepts [place] Access [place] patients after they have their name applied to their card.   Self-Pay (no insurance) in [facility]:  Organization         Address  Phone   Notes  Sickle Cell Patients, [facility] Internal Medicine [place] [phone]   [facility] [place] [phone]   [facility] [place]  [place], [address], [place] [phone]   [facility]/Dr. Osei-Bonsu  [address] or  [place], [address], [place] [phone] Phone number for both [facility] and [place] locations is the same.  Urgent Medical and [facility] [address], [place] [phone]   [facility] [address] [place] or [address] [phone]   [facility] [address], [place] [phone], phone; [phone], fax Sees patients 1st and 3rd Saturday of every month.  Must not qualify for public or private insurance (i.e. Medicaid, Medicare, [place] Health Choice, Veterans' Benefits)  Household income should be no more than 200% of the poverty level The clinic cannot treat you if you are pregnant or think you are pregnant  Sexually transmitted diseases are not treated at the clinic.    Dental Care: Organization         Address  Phone  Notes  [facility] Department of [facility] [place] [phone] Accepts children up to age 34 who are enrolled in [place] or [place]; pregnant women with a Medicaid card; and children who have applied for Medicaid or [place] Health Choice, but were declined, whose parents can pay a reduced fee at time of service.  [facility] Department of [facility]  [address], [place] [phone] Accepts children up to age 7 who are enrolled in [place] or [place]; pregnant women with a Medicaid card; and children who have applied for Medicaid or [place] Health Choice, but were declined, whose parents can  pay a reduced fee at time of service.  [place] Adult [facility] Access PROGRAM  [place] [phone] Patients are seen by appointment only. Walk-ins are not accepted. [place] will see patients 62 years of age and older. Monday - Tuesday (8am-5pm) Most Wednesdays (8:30-5pm) $30 per visit, cash only  [facility] Adult Dental Access PROGRAM  [address], [facility] [phone] Patients are seen by appointment only. Walk-ins are not accepted. [place] will see patients 62 years of age and older. One Wednesday Evening (Monthly: Volunteer Based).  $30 per visit, cash only  [place]  [phone] for adults; Children under age 22, call Graduate Pediatric Dentistry at [phone]. Children aged 48-14, please call [phone] to request a pediatric application.  Dental services are provided in all areas of dental care including fillings, crowns and bridges, complete and partial dentures, implants, gum treatment, root canals, and extractions. Preventive care is also provided. Treatment is provided to both adults and children. Patients are selected via a lottery and there is often a waiting list.   [facility] [address], [place]  [phone] [email or web address]   [facility] [address] [place], [place] [phone], Ext. [phone] Second and Fourth Thursday of each month, opens at 6:30 AM; Clinic ends at 9 AM.  Patients are seen on a first-come first-served basis, and a limited number are seen during each clinic.   [facility]  [address] Hillard Danker [place], [place] [phone]   Eligibility Requirements You must have lived in [place], [place], or [place] counties for at least the last three months.   You cannot be eligible for state or federal sponsored [employer], including [employer], [place], or [employer].   You generally cannot be eligible for healthcare  insurance through your employer.    How to apply: Eligibility screenings  are held every Tuesday and Wednesday afternoon from 1:00 pm until 4:00 pm. You do not need an appointment for the interview!  [facility] [address] [place], [place]   [place]  [place] Department  [place]  [phone]    Behavioral Health Resources in the Community: Intensive Outpatient Programs Organization         Address  Phone  Notes  [place] [facility] [place]. [address] [place], [place] [phone]   [facility] Outpatient [address], [place], [place]   ADS: Alcohol & Drug Svcs [address], [place], [place]   [place] [address]  [place], [place] or [phone]   Substance Abuse Resources Organization         Address  Phone  Notes  Alcohol and Drug Services  [phone]   [place]  [phone]   [facility] [place]   Chinita Pester  [phone]   Residential & Outpatient Substance Abuse Program  [phone]   Psychological Services Organization         Address  Phone  Notes  [facility]  [place]  [phone]   [place] [address], [place] or [phone]    Mobile Crisis Teams Organization         Address  Phone  Notes  [facility], Mobile Crisis Care Unit  [phone]   [facility]  [address] [place], [place]   [place] Levels [address] [place] [phone]    Self-Help/Support Groups Organization         Address  Phone             Notes  [place]. of [place] - variety of support groups  [place] Call for more information  Narcotics Anonymous (NA),  [facility] [address], [employer] [place]  2 meetings at this location   Special educational needs teacher         Address  Phone  Notes  [facility]AP Residential Treatment [place],    [place]  [phone]   [facility]  [address] [place] [phone], [place], [place]   [place], [place] [phone] Admissions: 8am-3pm M-F  Incentives Substance [place] [address]    [place], [place] [phone]   [facility] [address] [place], [place], [place]   [facility] [address]  [place], [place]   [facility] Programs - Intensive Outpatient [address], [place], [place]   [facility] ([place].,  [place], [place] [phone] or [phone]   [facility] ([facility]) [address], [place], [place] Accepts Medicaid  [facility] [place] [address],  [place] [phone] Substance Abuse/Addiction Treatment   [facility] Organization         Address  Phone  Notes  [facility]  [phone]   Domenic Schwab, PhD [address] Arlis Porta [place], [place]   [phone] or [phone]   [place]   [address] [place], [place] [phone]   [facility] [address] [place], [place] [phone] Insurance/Medicaid/sponsorship  through [facility] [address], [address] [place], [place] [phone] [place], [place] [phone]    Dr. Adele Schilder  [phone]   [facility] [place] Dept. 1) [address] [place] 2) [place] 3)  [address] [address], [place] [phone]  [phone]     [place] [phone] or [phone] (After Hours)

## 2013-05-19 NOTE — ED Provider Notes (Signed)
CSN: [id number]     Arrival date & time 05/19/13  1503 History   First MD Initiated Contact with Patient 05/19/13 1526     Chief Complaint  Patient presents with  . Pain   (Consider location/radiation/quality/duration/timing/severity/associated sxs/prior Treatment) HPI Comments: Patient is a 63 year old male with history of fibromyalgia, chronic pain, MS, hypothyroidism, hyperlipidemia, COPD, PE, CAD, anxiety, depression who presents today with worsening pain. This is the same pain as his chronic pain. It is a throbbing pain in his right shoulder and right knee. He has pain in his low back. No new pain. No new injury. He was evaluated yesterday and told to followup with his primary care physician. He has an appointment on 2/13. Reports that he "just needs pain medicine to get him through until tomorrow". Generally he takes methadone and fentanyl patches. He reports that he has been using his final patches too quickly because his remake keeps the heat on it and this makes the medicine disappear. He also reports that the [place] has lost many of his pain medications. Yesterday he was seen by Dr. Zenia Resides who discussed this case with Dr. Nicole Kindred of neurology. Dr. Nicole Kindred did not recommend beginning any steroid therapy. This patient has been kicked out of the [facility] pain clinic because he did not follow the pain contract. He has a plan to see Dr. Olena Heckle tomorrow.  The history is provided by the patient. No language interpreter was used.    Past Medical History  Diagnosis Date  . Fibromyalgia   . Multiple sclerosis     with optic neuritis  . Narcolepsy   . Scoliosis   . Hypothyroidism   . HLD (hyperlipidemia)   . Anemia of chronic disease   . Thrombocytopenia   . Pulmonary embolism     "suspected; never confirmed"--? treated with coumadin  . COPD (chronic obstructive pulmonary disease)   . Insomnia   . Carpal tunnel syndrome   . GERD (gastroesophageal reflux disease)   . BPH (benign  prostatic hypertrophy)   . History of gout   . Neurogenic bladder     chronic indwelling foley catheter-changed monthly  . Optic neuritis   . History of epistaxis   . History of MRSA infection 2012    UTI, bacteremia, and endocarditis  . Endocarditis 2012  . Supraspinatus tendon tear 2012  . Heart murmur   . Myocardial infarction     "according to stress test; that's news to me"  . Exertional dyspnea   . OSA (obstructive sleep apnea)     "tried CPAP; lost more sleep w/use"  . History of blood transfusion     "gave me too much coumadin and heparin once"  . H/O hiatal hernia   . Osteoarthritis of multiple joints     "everywhere"  . DDD (degenerative disc disease)     knees, hips, shoulders--Dr. Ronnie Derby [facility] prior to this)  . CAD (coronary artery disease) 11/2011    Stents x 3  . Anxiety and depression   . Steroid-induced psychosis   . History of renal failure 2011    "on dialysis 3 times over 2 years"   Past Surgical History  Procedure Laterality Date  . Carpal tunnel release  ~ 2010    left with radius fracture repair  . Nasal cautery    . Skin graft  04/2009    right thigh to right "wrist all the way to my shoulder; necrotizing fasciitis  . Colonoscopy  2000; 2005; 03/15/2011  diminutve adenoma--recall 2017  . Coronary angioplasty  11/22/2011    3 vessels  . Coronary angioplasty with stent placement  11/22/2011    DES to LAD  . Knee arthroscopy w/ meniscal repair  72's    right   Family History  Problem Relation Age of Onset  . Alzheimer's disease Mother   . Colon cancer Neg Hx    History  Substance Use Topics  . Smoking status: Current Every Day Smoker -- 1.00 packs/day for 40 years    Types: Cigarettes  . Smokeless tobacco: Never Used     Comment: Counseling sheet to quit smoking given in exam room   . Alcohol Use: Yes     Comment: 11/22/2011 "like crazy til 1988; nothing since"    Review of Systems  Constitutional: Negative for fever and chills.  Eyes:  Positive for visual disturbance (blurry vision).  Respiratory: Negative for shortness of breath.   Cardiovascular: Negative for chest pain.  Musculoskeletal: Positive for arthralgias, back pain, gait problem (antalgic) and myalgias.  All other systems reviewed and are negative.    Allergies  Ultram  Home Medications   Current Outpatient Rx  Name  Route  Sig  Dispense  Refill  . acetaminophen (TYLENOL) 500 MG tablet   Oral   Take 500 mg by mouth every 4 (four) hours as needed for mild pain, fever or headache.         . Armodafinil (NUVIGIL) 250 MG tablet   Oral   Take 250 mg by mouth daily.         Marland Kitchen aspirin 81 MG chewable tablet   Oral   Chew 81 mg by mouth daily.         . busPIRone (BUSPAR) 15 MG tablet   Oral   Take 15 mg by mouth 2 (two) times daily.          . calcium acetate (PHOSLO) 667 MG capsule   Oral   Take 667 mg by mouth daily as needed (for indigestion).         . carvedilol (COREG) 6.25 MG tablet   Oral   Take 6.25 mg by mouth 2 (two) times daily with a meal.         . cloNIDine (CATAPRES - DOSED IN MG/24 HR) 0.1 mg/24hr patch   Transdermal   Place 0.1 mg onto the skin once a week.          . DULoxetine (CYMBALTA) 60 MG capsule   Oral   Take 60 mg by mouth 2 (two) times daily.         . Fluticasone-Salmeterol (ADVAIR) 250-50 MCG/DOSE AEPB   Inhalation   Inhale 1 puff into the lungs every 12 (twelve) hours.         Marland Kitchen guaifenesin (ROBITUSSIN) 100 MG/5ML syrup   Oral   Take 200 mg by mouth every 6 (six) hours as needed for cough.         Marland Kitchen HYDROcodone-acetaminophen (NORCO/VICODIN) 5-325 MG per tablet      1 to 2 tabs every 4 to 6 hours as needed for pain.   12 tablet   0   . loperamide (IMODIUM) 2 MG capsule   Oral   Take 2 mg by mouth as needed for diarrhea or loose stools (As needed with each loose stool for diarrhea).         . magnesium hydroxide (MILK OF MAGNESIA) 400 MG/5ML suspension   Oral   Take 30 mLs by  mouth at bedtime as needed for mild constipation.         . Multiple Vitamins-Minerals (THEREMS-M) TABS   Oral   Take 1 tablet by mouth daily.         . nitroGLYCERIN (NITROSTAT) 0.3 MG SL tablet   Sublingual   Place 0.4 mg under the tongue every 5 (five) minutes as needed for chest pain.         . vitamin C (ASCORBIC ACID) 500 MG tablet   Oral   Take 1,000 mg by mouth 2 (two) times daily.           BP 153/81  Pulse 73  Temp(Src) 98.9 F (37.2 C) (Oral)  Resp 18  SpO2 98% Physical Exam  Nursing note and vitals reviewed. Constitutional: He is oriented to person, place, and time. He appears well-developed and well-nourished. No distress.  HENT:  Head: Normocephalic and atraumatic.  Right Ear: External ear normal.  Left Ear: External ear normal.  Nose: Nose normal.  Eyes: Conjunctivae are normal.  Neck: Normal range of motion. No tracheal deviation present.  Cardiovascular: Normal rate, regular rhythm and normal heart sounds.   Pulmonary/Chest: Effort normal and breath sounds normal. No stridor.  Abdominal: Soft. He exhibits no distension. There is no tenderness.  Musculoskeletal: Normal range of motion.  ttp diffusely over right shoulder, although patient laying on right side bearing weight over right shoulder. TTP over right knee. TTP diffusely over back. No step offs or deformity.   Neurological: He is alert and oriented to person, place, and time. He has normal strength. Gait (antalgic) abnormal.  Skin: Skin is warm and dry. He is not diaphoretic.  Psychiatric: His behavior is normal. His affect is angry.    ED Course  Procedures (including critical care time) Labs Review Labs Reviewed - No data to display Imaging Review No results found.  EKG Interpretation   None       MDM   1. Multiple sclerosis   2. Chronic pain    Pt presents today for exacerbation of chronic pain. Seen yesterday for the same. No new pain. Patient requesting methadone and  fentanyl patches. He has appointment to be seen on the 13th and a plan to be seen tomorrow by Dr. Olena Heckle. At this point there is nothing emergent to investigate for the patient. He has been kicked out of pain management. No pain meds here. Patient became very angry with me. Discussed this case with Dr. Zenia Resides who agrees with plan. Vital signs stable for discharge. Return instructions given. Patient / Family / Caregiver informed of clinical course, understand medical decision-making process, and agree with plan.     Elwyn Lade, PA-C 05/19/13 [phone]

## 2013-05-19 NOTE — ED Provider Notes (Signed)
Medical screening examination/treatment/procedure(s) were performed by non-physician practitioner and as supervising physician I was immediately available for consultation/collaboration.  EKG Interpretation   None         Grosser T Gioia Ranes, MD 05/19/13 2329 

## 2013-05-19 NOTE — ED Notes (Signed)
Patient is from [facility]. He was seen here yesterday for chronic body ache, which has increased today. There is question at [facility] whether patient is abusing medication. 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

## 2013-05-20 NOTE — ED Provider Notes (Signed)
Medical screening examination/treatment/procedure(s) were performed by non-physician practitioner and as supervising physician I was immediately available for consultation/collaboration.  EKG Interpretation   None         Saddie Benders. Jameel Quant, MD 05/20/13 1407

## 2013-05-21 ENCOUNTER — Emergency Department (HOSPITAL_COMMUNITY)
Admission: EM | Admit: 2013-05-21 | Discharge: 2013-05-21 | Disposition: A | Discharge status: Home / Self Care | Payer: Medicare Other | Attending: Emergency Medicine | Admitting: Emergency Medicine

## 2013-05-21 ENCOUNTER — Encounter (HOSPITAL_COMMUNITY): Payer: Self-pay | Admitting: Emergency Medicine

## 2013-05-21 ENCOUNTER — Telehealth: Payer: Self-pay | Admitting: Family Medicine

## 2013-05-21 DIAGNOSIS — F17200 Nicotine dependence, unspecified, uncomplicated: Secondary | ICD-10-CM | POA: Insufficient documentation

## 2013-05-21 DIAGNOSIS — Z86711 Personal history of pulmonary embolism: Secondary | ICD-10-CM | POA: Insufficient documentation

## 2013-05-21 DIAGNOSIS — M159 Polyosteoarthritis, unspecified: Secondary | ICD-10-CM | POA: Insufficient documentation

## 2013-05-21 DIAGNOSIS — F3289 Other specified depressive episodes: Secondary | ICD-10-CM | POA: Insufficient documentation

## 2013-05-21 DIAGNOSIS — G4733 Obstructive sleep apnea (adult) (pediatric): Secondary | ICD-10-CM | POA: Insufficient documentation

## 2013-05-21 DIAGNOSIS — E039 Hypothyroidism, unspecified: Secondary | ICD-10-CM | POA: Insufficient documentation

## 2013-05-21 DIAGNOSIS — F329 Major depressive disorder, single episode, unspecified: Secondary | ICD-10-CM | POA: Insufficient documentation

## 2013-05-21 DIAGNOSIS — J4489 Other specified chronic obstructive pulmonary disease: Secondary | ICD-10-CM | POA: Insufficient documentation

## 2013-05-21 DIAGNOSIS — N400 Enlarged prostate without lower urinary tract symptoms: Secondary | ICD-10-CM | POA: Insufficient documentation

## 2013-05-21 DIAGNOSIS — R011 Cardiac murmur, unspecified: Secondary | ICD-10-CM | POA: Insufficient documentation

## 2013-05-21 DIAGNOSIS — I252 Old myocardial infarction: Secondary | ICD-10-CM | POA: Insufficient documentation

## 2013-05-21 DIAGNOSIS — Z862 Personal history of diseases of the blood and blood-forming organs and certain disorders involving the immune mechanism: Secondary | ICD-10-CM | POA: Insufficient documentation

## 2013-05-21 DIAGNOSIS — G4700 Insomnia, unspecified: Secondary | ICD-10-CM | POA: Insufficient documentation

## 2013-05-21 DIAGNOSIS — Z992 Dependence on renal dialysis: Secondary | ICD-10-CM | POA: Insufficient documentation

## 2013-05-21 DIAGNOSIS — IMO0002 Reserved for concepts with insufficient information to code with codable children: Secondary | ICD-10-CM | POA: Insufficient documentation

## 2013-05-21 DIAGNOSIS — Z9861 Coronary angioplasty status: Secondary | ICD-10-CM | POA: Insufficient documentation

## 2013-05-21 DIAGNOSIS — F411 Generalized anxiety disorder: Secondary | ICD-10-CM | POA: Insufficient documentation

## 2013-05-21 DIAGNOSIS — R52 Pain, unspecified: Secondary | ICD-10-CM | POA: Insufficient documentation

## 2013-05-21 DIAGNOSIS — I2510 Atherosclerotic heart disease of native coronary artery without angina pectoris: Secondary | ICD-10-CM | POA: Insufficient documentation

## 2013-05-21 DIAGNOSIS — Z8614 Personal history of Methicillin resistant Staphylococcus aureus infection: Secondary | ICD-10-CM | POA: Insufficient documentation

## 2013-05-21 DIAGNOSIS — Z9981 Dependence on supplemental oxygen: Secondary | ICD-10-CM | POA: Insufficient documentation

## 2013-05-21 DIAGNOSIS — N186 End stage renal disease: Secondary | ICD-10-CM | POA: Insufficient documentation

## 2013-05-21 DIAGNOSIS — J449 Chronic obstructive pulmonary disease, unspecified: Secondary | ICD-10-CM | POA: Insufficient documentation

## 2013-05-21 DIAGNOSIS — M4120 Other idiopathic scoliosis, site unspecified: Secondary | ICD-10-CM | POA: Insufficient documentation

## 2013-05-21 DIAGNOSIS — G8929 Other chronic pain: Secondary | ICD-10-CM | POA: Insufficient documentation

## 2013-05-21 DIAGNOSIS — Z8669 Personal history of other diseases of the nervous system and sense organs: Secondary | ICD-10-CM | POA: Insufficient documentation

## 2013-05-21 DIAGNOSIS — Z7982 Long term (current) use of aspirin: Secondary | ICD-10-CM | POA: Insufficient documentation

## 2013-05-21 DIAGNOSIS — K219 Gastro-esophageal reflux disease without esophagitis: Secondary | ICD-10-CM | POA: Insufficient documentation

## 2013-05-21 DIAGNOSIS — E785 Hyperlipidemia, unspecified: Secondary | ICD-10-CM | POA: Insufficient documentation

## 2013-05-21 NOTE — ED Notes (Signed)
Per EMS-pt called EMS for c/o chronic pain. Pt is requesting refill of pain meds. Stated that he is to be seen by PCP in 3 days

## 2013-05-21 NOTE — Telephone Encounter (Signed)
Just an FYI.  Patient called today and stated that he is back at hospital.  He wanted to see if you would tell [facility] long hospital staff to put pain patches on him.  I told him that you were not here and that the physicians at the hospital would do what they felt was appropriate.  I also told him that the patches that he got from you at his last OV was a one time deal and it would not happen again.  Patient voiced understanding.

## 2013-05-21 NOTE — ED Notes (Signed)
Bed: WTR7 Expected date:  Expected time:  Means of arrival:  Comments: Pain all over x 24 years

## 2013-05-21 NOTE — ED Notes (Signed)
Pt stated that pain started a few days ago when he ran out of his medication

## 2013-05-21 NOTE — Discharge Instructions (Signed)
Call for a follow up appointment with a Family or Primary Care Provider.  Call a Pain specialist for your Chronic pain. Keep your appointment on the 13th with the neurologist.  Return if Symptoms worsen.   Take medication as prescribed.

## 2013-05-21 NOTE — ED Notes (Signed)
[facility] CALLED TO TRANSPORT PT

## 2013-05-21 NOTE — ED Provider Notes (Signed)
CSN: [id number]     Arrival date & time 05/21/13  1317 History  This chart was scribed for non-physician practitioner, Dale Heck, PA-C working with Dale Jacobsen, MD by Frederich Balding, ED scribe. This patient was seen in room WTR7/WTR7 and the patient's care was started at 2:30 PM.    Chief Complaint  Patient presents with  . Generalized Body Aches    chronic pain   The history is provided by the patient. No language interpreter was used.   HPI Comments: Dale Shields is a 63 y.o. male who presents to the Emergency Department complaining of worsening chronic pain since 1990. Denies new pain. Pt has neck pain, lower back pain, right shoulder pain, hip pain and right knee pain. He was seen two days ago for the same and was supposed to have a follow up appointment yesterday with Dr. Olena Shields. Pt states he went to Dr. Olena Shields' office yesterday but states she was on 'winter vacation'. He went to see another doctor today but states he has an appointment with Dr. Anitra Shields in 3 days. Pt has taken extra strength tylenol with no relief. Denies fever. He continually asks for pain medication and refills on his fentanyl patches.   Past Medical History  Diagnosis Date  . Fibromyalgia   . Multiple sclerosis     with optic neuritis  . Narcolepsy   . Scoliosis   . Hypothyroidism   . HLD (hyperlipidemia)   . Anemia of chronic disease   . Thrombocytopenia   . Pulmonary embolism     "suspected; never confirmed"--? treated with coumadin  . COPD (chronic obstructive pulmonary disease)   . Insomnia   . Carpal tunnel syndrome   . GERD (gastroesophageal reflux disease)   . BPH (benign prostatic hypertrophy)   . History of gout   . Neurogenic bladder     chronic indwelling foley catheter-changed monthly  . Optic neuritis   . History of epistaxis   . History of MRSA infection 2012    UTI, bacteremia, and endocarditis  . Endocarditis 2012  . Supraspinatus tendon tear 2012  . Heart murmur   .  Myocardial infarction     "according to stress test; that's news to me"  . Exertional dyspnea   . OSA (obstructive sleep apnea)     "tried CPAP; lost more sleep w/use"  . History of blood transfusion     "gave me too much coumadin and heparin once"  . H/O hiatal hernia   . Osteoarthritis of multiple joints     "everywhere"  . DDD (degenerative disc disease)     knees, hips, shoulders--Dr. Ronnie Derby [facility] prior to this)  . CAD (coronary artery disease) 11/2011    Stents x 3  . Anxiety and depression   . Steroid-induced psychosis   . History of renal failure 2011    "on dialysis 3 times over 2 years"   Past Surgical History  Procedure Laterality Date  . Carpal tunnel release  ~ 2010    left with radius fracture repair  . Nasal cautery    . Skin graft  04/2009    right thigh to right "wrist all the way to my shoulder; necrotizing fasciitis  . Colonoscopy  2000; 2005; 03/15/2011    diminutve adenoma--recall 2017  . Coronary angioplasty  11/22/2011    3 vessels  . Coronary angioplasty with stent placement  11/22/2011    DES to LAD  . Knee arthroscopy w/ meniscal repair  [phone]  right   Family History  Problem Relation Age of Onset  . Alzheimer's disease Mother   . Colon cancer Neg Hx    History  Substance Use Topics  . Smoking status: Current Every Day Smoker -- 1.00 packs/day for 40 years    Types: Cigarettes  . Smokeless tobacco: Never Used     Comment: Counseling sheet to quit smoking given in exam room   . Alcohol Use: Yes     Comment: 11/22/2011 "like crazy til 1988; nothing since"    Review of Systems  Constitutional: Negative for fever.  Musculoskeletal: Positive for arthralgias, back pain, myalgias and neck pain.  All other systems reviewed and are negative.    Allergies  Ultram  Home Medications   Current Outpatient Rx  Name  Route  Sig  Dispense  Refill  . acetaminophen (TYLENOL) 500 MG tablet   Oral   Take 500 mg by mouth every 4 (four) hours as needed  for mild pain, fever or headache.         . Armodafinil (NUVIGIL) 250 MG tablet   Oral   Take 250 mg by mouth daily.         Marland Kitchen aspirin 81 MG chewable tablet   Oral   Chew 81 mg by mouth daily.         . busPIRone (BUSPAR) 15 MG tablet   Oral   Take 15 mg by mouth 2 (two) times daily.          . calcium acetate (PHOSLO) 667 MG capsule   Oral   Take 667 mg by mouth daily as needed (for indigestion).         . carvedilol (COREG) 6.25 MG tablet   Oral   Take 6.25 mg by mouth 2 (two) times daily with a meal.         . clonazePAM (KLONOPIN) 0.5 MG tablet   Oral   Take 0.25 mg by mouth at bedtime.          . clonazePAM (KLONOPIN) 0.5 MG tablet   Oral   Take 0.5 mg by mouth 2 (two) times daily as needed for anxiety.         . cloNIDine (CATAPRES - DOSED IN MG/24 HR) 0.1 mg/24hr patch   Transdermal   Place 0.1 mg onto the skin once a week.          . DULoxetine (CYMBALTA) 60 MG capsule   Oral   Take 60 mg by mouth 2 (two) times daily.         Marland Kitchen guaifenesin (ROBITUSSIN) 100 MG/5ML syrup   Oral   Take 200 mg by mouth every 6 (six) hours as needed for cough.         Marland Kitchen HYDROcodone-acetaminophen (NORCO/VICODIN) 5-325 MG per tablet   Oral   Take 1 tablet by mouth every 6 (six) hours as needed for moderate pain.         Marland Kitchen loperamide (IMODIUM) 2 MG capsule   Oral   Take 2 mg by mouth as needed for diarrhea or loose stools (As needed with each loose stool for diarrhea).         . magnesium hydroxide (MILK OF MAGNESIA) 400 MG/5ML suspension   Oral   Take 30 mLs by mouth at bedtime as needed for mild constipation.         . Multiple Vitamins-Minerals (THEREMS-M) TABS   Oral   Take 1 tablet by mouth daily.         Marland Kitchen  nitroGLYCERIN (NITROSTAT) 0.3 MG SL tablet   Sublingual   Place 0.4 mg under the tongue every 5 (five) minutes as needed for chest pain.         . vitamin C (ASCORBIC ACID) 500 MG tablet   Oral   Take 1,000 mg by mouth 2 (two)  times daily.           BP 160/71  Pulse 80  Temp(Src) 98.2 F (36.8 C) (Oral)  Resp 22  SpO2 97%  Physical Exam  Nursing note and vitals reviewed. Constitutional: He is oriented to person, place, and time. He appears well-developed and well-nourished. No distress.  Speaks in complete sentences.   HENT:  Head: Normocephalic and atraumatic.  Eyes: EOM are normal.  Neck: Neck supple. No tracheal deviation present.  Cardiovascular: Normal rate, regular rhythm and normal heart sounds.   Pulmonary/Chest: Effort normal and breath sounds normal. No respiratory distress. He has no wheezes. He has no rales.  Abdominal: Soft. There is no tenderness.  Musculoskeletal: Normal range of motion. He exhibits no edema and no tenderness.  Full ROM. Normal strength in upper and lower extremities.  Neurological: He is alert and oriented to person, place, and time.  Skin: Skin is warm and dry.  Psychiatric: His speech is normal and behavior is normal. Thought content normal. His affect is angry.    ED Course  Procedures (including critical care time)  COORDINATION OF CARE: 2:38 PM-Discussed treatment plan which includes following up with his PCP or a neurologist with pt at bedside and pt agreed to plan. Advised pt he is not going to be given any pain medication in the ED or discharged home with anything.   Labs Review Labs Reviewed - No data to display Imaging Review No results found.  EKG Interpretation   None       MDM   Final diagnoses:  Chronic pain   Pt with a history of chronic pain with multiple visits to the ED for similar complaints in the past. He reports chronic pain ongoing since 1990.  I personally evaluated the patient on 05/12/2013 for similar complaints, at that time I ordered two fentanyl patches and advised him to follow up with chronic pain doctor.   The patient is in NAD, no recent history of trauma or injury,  good ROM with all 4 extremities, VSS.  Dr. Zenia Resides advises  not to prescribe pain medications at this time.  I don't believe imaging is warranted at this time. Discussed treatment plan with the patient, which includes following with a chronic pain doctor and to call his Neurologist for an earlier appointment if needed. Return precautions given. Reports understanding and no other concerns at this time.  Patient is stable for discharge at this time.  I personally performed the services described in this documentation, which was scribed in my presence. The recorded information has been reviewed and is accurate.  Lorrine Kin, PA-C 05/21/13 2006

## 2013-05-21 NOTE — Telephone Encounter (Signed)
Noted. Agree Thx! 

## 2013-05-22 NOTE — ED Provider Notes (Signed)
Medical screening examination/treatment/procedure(s) were performed by non-physician practitioner and as supervising physician I was immediately available for consultation/collaboration.  EKG Interpretation   None        [name], MD 05/22/13 1109

## 2013-05-24 ENCOUNTER — Emergency Department (HOSPITAL_COMMUNITY)
Admission: EM | Admit: 2013-05-24 | Discharge: 2013-05-24 | Disposition: A | Discharge status: Skilled Nursing Facility | Payer: Medicare Other | Attending: Emergency Medicine | Admitting: Emergency Medicine

## 2013-05-24 ENCOUNTER — Ambulatory Visit (INDEPENDENT_AMBULATORY_CARE_PROVIDER_SITE_OTHER): Payer: Medicare Other | Admitting: Neurology

## 2013-05-24 ENCOUNTER — Encounter (HOSPITAL_COMMUNITY): Payer: Self-pay | Admitting: Emergency Medicine

## 2013-05-24 ENCOUNTER — Encounter: Payer: Self-pay | Admitting: Neurology

## 2013-05-24 VITALS — BP 148/84 | HR 90 | Ht 70.0 in | Wt 212.0 lb

## 2013-05-24 DIAGNOSIS — G8929 Other chronic pain: Secondary | ICD-10-CM | POA: Insufficient documentation

## 2013-05-24 DIAGNOSIS — Z8639 Personal history of other endocrine, nutritional and metabolic disease: Secondary | ICD-10-CM | POA: Insufficient documentation

## 2013-05-24 DIAGNOSIS — Z7982 Long term (current) use of aspirin: Secondary | ICD-10-CM | POA: Insufficient documentation

## 2013-05-24 DIAGNOSIS — G4733 Obstructive sleep apnea (adult) (pediatric): Secondary | ICD-10-CM | POA: Insufficient documentation

## 2013-05-24 DIAGNOSIS — G35 Multiple sclerosis: Secondary | ICD-10-CM

## 2013-05-24 DIAGNOSIS — Z79899 Other long term (current) drug therapy: Secondary | ICD-10-CM | POA: Insufficient documentation

## 2013-05-24 DIAGNOSIS — G894 Chronic pain syndrome: Secondary | ICD-10-CM

## 2013-05-24 DIAGNOSIS — R630 Anorexia: Secondary | ICD-10-CM | POA: Insufficient documentation

## 2013-05-24 DIAGNOSIS — IMO0002 Reserved for concepts with insufficient information to code with codable children: Secondary | ICD-10-CM | POA: Insufficient documentation

## 2013-05-24 DIAGNOSIS — M545 Low back pain, unspecified: Secondary | ICD-10-CM | POA: Insufficient documentation

## 2013-05-24 DIAGNOSIS — R011 Cardiac murmur, unspecified: Secondary | ICD-10-CM | POA: Insufficient documentation

## 2013-05-24 DIAGNOSIS — M159 Polyosteoarthritis, unspecified: Secondary | ICD-10-CM | POA: Insufficient documentation

## 2013-05-24 DIAGNOSIS — Z8719 Personal history of other diseases of the digestive system: Secondary | ICD-10-CM | POA: Insufficient documentation

## 2013-05-24 DIAGNOSIS — I252 Old myocardial infarction: Secondary | ICD-10-CM | POA: Insufficient documentation

## 2013-05-24 DIAGNOSIS — J4489 Other specified chronic obstructive pulmonary disease: Secondary | ICD-10-CM | POA: Insufficient documentation

## 2013-05-24 DIAGNOSIS — Z86711 Personal history of pulmonary embolism: Secondary | ICD-10-CM | POA: Insufficient documentation

## 2013-05-24 DIAGNOSIS — Z862 Personal history of diseases of the blood and blood-forming organs and certain disorders involving the immune mechanism: Secondary | ICD-10-CM | POA: Insufficient documentation

## 2013-05-24 DIAGNOSIS — F3289 Other specified depressive episodes: Secondary | ICD-10-CM | POA: Insufficient documentation

## 2013-05-24 DIAGNOSIS — F411 Generalized anxiety disorder: Secondary | ICD-10-CM | POA: Insufficient documentation

## 2013-05-24 DIAGNOSIS — F329 Major depressive disorder, single episode, unspecified: Secondary | ICD-10-CM | POA: Insufficient documentation

## 2013-05-24 DIAGNOSIS — Z9861 Coronary angioplasty status: Secondary | ICD-10-CM | POA: Insufficient documentation

## 2013-05-24 DIAGNOSIS — J449 Chronic obstructive pulmonary disease, unspecified: Secondary | ICD-10-CM | POA: Insufficient documentation

## 2013-05-24 DIAGNOSIS — N186 End stage renal disease: Secondary | ICD-10-CM | POA: Insufficient documentation

## 2013-05-24 DIAGNOSIS — Z8614 Personal history of Methicillin resistant Staphylococcus aureus infection: Secondary | ICD-10-CM | POA: Insufficient documentation

## 2013-05-24 DIAGNOSIS — I2510 Atherosclerotic heart disease of native coronary artery without angina pectoris: Secondary | ICD-10-CM | POA: Insufficient documentation

## 2013-05-24 DIAGNOSIS — F17200 Nicotine dependence, unspecified, uncomplicated: Secondary | ICD-10-CM | POA: Insufficient documentation

## 2013-05-24 NOTE — ED Notes (Signed)
[facility] presents with 63 yo male from [facility] who was kicked out of a doctors' office today for requesting pain medication.  Pt went to another provider to request pain medication for back pain and was denied there also.  Pt VS stable.  Pain level at 10 of 10.  Pt has hx of MS and chronic back pain since 1999.

## 2013-05-24 NOTE — Telephone Encounter (Signed)
Dr Lowella Dandy Of[facility] note is clear that he will not prescribe any more pain meds. If pt is feeling suicidal, he needs to go to [facility] ED-they have a [facility] ED unit.

## 2013-05-24 NOTE — ED Notes (Signed)
[facility] contacted for transport 

## 2013-05-24 NOTE — Telephone Encounter (Signed)
Pt was seen today by Dr. Tomi Likens and refused pain meds, he is currently in the ED

## 2013-05-24 NOTE — ED Provider Notes (Signed)
CSN: [id number]     Arrival date & time 05/24/13  1257 History  This chart was scribed for non-physician practitioner Jaynie Crumble, PA-C working with Laray Anger, DO by Dorothey Baseman, ED Scribe. This patient was seen in room TR10C/TR10C and the patient's care was started at 2:13 PM.    Chief Complaint  Patient presents with  . Back Pain   The history is provided by the patient and the EMS personnel. No language interpreter was used.   HPI Comments: Dale Shields is a 63 y.o. Male with a history of chronic back pain since 1990, degenerative disc disease, fibromyalgia, scoliosis, gout, osteoarthritis, and MS brought in by EMS who presents to the Emergency Department complaining of a constant pain to the lower back that is chronic in nature that he states has been progressively worsening. He reports associated decreased appetite and decreased sleep secondary to the pain. He states that he stopped taking methadone on 04/24/2013 and that his new, current doctor refused to prescribe him another fentanyl patch and that "he does not prescribe pain medication." Per EMS, patient was asked to leave [facility] earlier today for requesting narcotic pain medication, so he went to another provider and was also told he would not be receiving any narcotic pain medication there. Patient has been seen here multiple times (today is his 7th visit in 13 days) for similar complaints/types of behavior and was told he would not be receiving pain management in the ED and he was advised to follow up with neurology. Patient has an appointment scheduled with neurology to receive MRIs (brain, C spine, T spine) scheduled on 06/06/2013. He reports allergies to Ultram (causes seizures, per patient). Patient also has a history of hypothyroidism, hyperlipidemia, COPD, carpal tunnel syndrome, GERD, endocarditis, MI, CAD, and renal failure.   Past Medical History  Diagnosis Date  . Fibromyalgia   . Multiple sclerosis      with optic neuritis  . Narcolepsy   . Scoliosis   . Hypothyroidism   . HLD (hyperlipidemia)   . Anemia of chronic disease   . Thrombocytopenia   . Pulmonary embolism     "suspected; never confirmed"--? treated with coumadin  . COPD (chronic obstructive pulmonary disease)   . Insomnia   . Carpal tunnel syndrome   . GERD (gastroesophageal reflux disease)   . BPH (benign prostatic hypertrophy)   . History of gout   . Neurogenic bladder     chronic indwelling foley catheter-changed monthly  . Optic neuritis   . History of epistaxis   . History of MRSA infection 2012    UTI, bacteremia, and endocarditis  . Endocarditis 2012  . Supraspinatus tendon tear 2012  . Heart murmur   . Myocardial infarction     "according to stress test; that's news to me"  . Exertional dyspnea   . OSA (obstructive sleep apnea)     "tried CPAP; lost more sleep w/use"  . History of blood transfusion     "gave me too much coumadin and heparin once"  . H/O hiatal hernia   . Osteoarthritis of multiple joints     "everywhere"  . DDD (degenerative disc disease)     knees, hips, shoulders--Dr. Sherlean Foot [facility] prior to this)  . CAD (coronary artery disease) 11/2011    Stents x 3  . Anxiety and depression   . Steroid-induced psychosis   . History of renal failure 2011    "on dialysis 3 times over 2 years"  Past Surgical History  Procedure Laterality Date  . Carpal tunnel release  ~ 2010    left with radius fracture repair  . Nasal cautery    . Skin graft  04/2009    right thigh to right "wrist all the way to my shoulder; necrotizing fasciitis  . Colonoscopy  2000; 2005; 03/15/2011    diminutve adenoma--recall 2017  . Coronary angioplasty  11/22/2011    3 vessels  . Coronary angioplasty with stent placement  11/22/2011    DES to LAD  . Knee arthroscopy w/ meniscal repair  48's    right   Family History  Problem Relation Age of Onset  . Alzheimer's disease Mother   . Colon cancer Neg Hx     History  Substance Use Topics  . Smoking status: Current Every Day Smoker -- 1.00 packs/day for 40 years    Types: Cigarettes  . Smokeless tobacco: Never Used     Comment: Counseling sheet to quit smoking given in exam room   . Alcohol Use: Yes     Comment: 11/22/2011 "like crazy til 1988; nothing since"    Review of Systems  Constitutional: Positive for appetite change (decreased).  Musculoskeletal: Positive for back pain (chronic).  Psychiatric/Behavioral: Positive for sleep disturbance (decreased).   Allergies  Ultram  Home Medications   Current Outpatient Rx  Name  Route  Sig  Dispense  Refill  . acetaminophen (TYLENOL) 500 MG tablet   Oral   Take 500 mg by mouth every 4 (four) hours as needed for mild pain, fever or headache.         . Armodafinil (NUVIGIL) 250 MG tablet   Oral   Take 250 mg by mouth daily.         Marland Kitchen aspirin 81 MG chewable tablet   Oral   Chew 81 mg by mouth daily.         . busPIRone (BUSPAR) 15 MG tablet   Oral   Take 15 mg by mouth 2 (two) times daily.          . calcium acetate (PHOSLO) 667 MG capsule   Oral   Take 667 mg by mouth daily as needed (for indigestion).         . carvedilol (COREG) 6.25 MG tablet   Oral   Take 6.25 mg by mouth 2 (two) times daily with a meal.         . clonazePAM (KLONOPIN) 0.5 MG tablet   Oral   Take 0.25 mg by mouth at bedtime.          . clonazePAM (KLONOPIN) 0.5 MG tablet   Oral   Take 0.5 mg by mouth 2 (two) times daily as needed for anxiety.         . cloNIDine (CATAPRES - DOSED IN MG/24 HR) 0.1 mg/24hr patch   Transdermal   Place 0.1 mg onto the skin once a week.          . DULoxetine (CYMBALTA) 60 MG capsule   Oral   Take 60 mg by mouth 2 (two) times daily.         . fentaNYL (DURAGESIC - DOSED MCG/HR) 100 MCG/HR   Transdermal   Place 200 mcg onto the skin every 3 (three) days.         Marland Kitchen guaifenesin (ROBITUSSIN) 100 MG/5ML syrup   Oral   Take 200 mg by mouth  every 6 (six) hours as needed for cough.         Marland Kitchen  HYDROcodone-acetaminophen (NORCO/VICODIN) 5-325 MG per tablet   Oral   Take 1 tablet by mouth every 6 (six) hours as needed for moderate pain.         Marland Kitchen loperamide (IMODIUM) 2 MG capsule   Oral   Take 2 mg by mouth as needed for diarrhea or loose stools (As needed with each loose stool for diarrhea).         . magnesium hydroxide (MILK OF MAGNESIA) 400 MG/5ML suspension   Oral   Take 30 mLs by mouth at bedtime as needed for mild constipation.         . Multiple Vitamins-Minerals (THEREMS-M) TABS   Oral   Take 1 tablet by mouth daily.         . nitroGLYCERIN (NITROSTAT) 0.3 MG SL tablet   Sublingual   Place 0.4 mg under the tongue every 5 (five) minutes as needed for chest pain.         . vitamin C (ASCORBIC ACID) 500 MG tablet   Oral   Take 1,000 mg by mouth 2 (two) times daily.           Triage Vitals: Pulse 77  Temp(Src) 98 F (36.7 C) (Oral)  Resp 18  SpO2 96%  Physical Exam  Nursing note and vitals reviewed. Constitutional: He is oriented to person, place, and time. He appears well-developed and well-nourished. No distress.  HENT:  Head: Normocephalic and atraumatic.  Eyes: Conjunctivae are normal.  Neck: Normal range of motion. Neck supple.  Pulmonary/Chest: Effort normal. No respiratory distress.  Abdominal: He exhibits no distension.  Musculoskeletal: Normal range of motion.  Midline thoracic and lumbar spine tenderness  Neurological: He is alert and oriented to person, place, and time.  Decreased sensation and weakness in bilateral legs in all dermatomes, patient is uncooperative, unable to get good physical exam. He is unable to walk due to MS weakness.  Skin: Skin is warm and dry.  Psychiatric: He has a normal mood and affect. His behavior is normal.    ED Course  Procedures (including critical care time)  DIAGNOSTIC STUDIES: Oxygen Saturation is 96% on room air, normal by my  interpretation.    COORDINATION OF CARE: 2:17 PM- Discussed that the patient will not be receiving any narcotic pain medication today in the ED. Advised patient to follow up with his PCP for his chronic pain management. Discussed treatment plan with patient at bedside and patient verbalized agreement.     Labs Review Labs Reviewed - No data to display Imaging Review No results found.  EKG Interpretation   None       MDM   Final diagnoses:  Chronic pain    Patient with history of MS here with chronic pain. Patient was discharged from his pain management group, his new primary care doctor is not prescribing pain medications, and his neurologist who is seen today also not treating his pain. Patient has now seen 7 people in the last 13 days for his symptoms. He has had several visits in emergency department. At this time patient has no new neuro deficits, his pain is unchanged from prior, he has not had any injuries. No signs of cauda equina. He was just seen by neurologist today and has an MRI of the brain, cervical and thoracic spine ordered for February 26. I will not be giving patient pain medications today. I have advised him to call his primary care Dr. for follow up with pain management for treatment of his pain.  Filed Vitals:   05/24/13 1257 05/24/13 1306 05/24/13 1307 05/24/13 1507  BP:   153/100 166/107  Pulse:  77  74  Temp:  98 F (36.7 C)    TempSrc:  Oral    Resp:  18  16  SpO2: 100% 96%  100%    I personally performed the services described in this documentation, which was scribed in my presence. The recorded information has been reviewed and is accurate.     Renold Genta, PA-C 05/24/13 1640

## 2013-05-24 NOTE — Telephone Encounter (Signed)
Patient called office in tears, he was discharged from ER and told they would no longer give pain meds, he states he "is in so much pain that life is not worth living" wants to know what to do to get something for his pain. I advised patient that he has been referred to pain management and that Dr. Anitra Lauth is out of the office but I would see what Layne suggests for him and call him back, please advise

## 2013-05-24 NOTE — ED Notes (Addendum)
Pt requested to go home; called secretary to call [facility] to transport to [facility]

## 2013-05-24 NOTE — Patient Instructions (Addendum)
We get another MRI of the brain and spinal cord.  Follow testing, we may consider further testing.  Discuss with your primary doctor regarding pain management.  Follow up in 3 months or as needed.  Your MRI's are scheduled for 06/06/13 at 8:00am at [facility]. Please arrive 15 minutes prior for your appointment for registration. If you need to reschedule or cancel, please call [phone].

## 2013-05-24 NOTE — Telephone Encounter (Signed)
Called patient, no answer and no voicemail set up.

## 2013-05-24 NOTE — Progress Notes (Signed)
NEUROLOGY CONSULTATION NOTE  Dale Shields MRN: [id number] DOB: [birth year]  Referring provider: Dr. Anitra Lauth Primary care provider: Dr. Anitra Lauth  Reason for consult:  History of MS  HISTORY OF PRESENT ILLNESS: Dale Shields is a 63 year old right-handed man with history of multiple sclerosis, obesity, insomnia, fibromyalgia, COPD, sleep apnea, hypothyroidism, thrombocytopenia, endocarditis, hyperlipidemia, CAD with stents x3, neurogenic bladder, arthritis and history of renal failure who presents to establish care regarding multiple sclerosis.  Records and images were personally reviewed where available.    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  At age 26, he had transient blindness in both eyes.  At age 79, he had transient blindness in one eye.  4 years later, he had transient blindness again in both eyes.  He was symptom-free up until around 1990.  He had developed extreme fatigued.  He was evaluated at [facility], where he had an LP and was told that he had MS.  Over the years, he had been on Avonex, Betaseron and Copaxone at some point.  He has not been on any disease-modifying agents for several years.  He was last evaluated by a neurologist in [facility], who told him that he probably doesn't need any treatment at this point.  He really hasn't had any flare ups.  Two years ago, he had problems with his balance but was found to have a UTI.  The other day, he developed blurred vision in his right eye, which he thinks may have been related to stress.  He also has history of chronic pain.  He has been on several pain medications, including controlled substances.  He is currently not followed by a pain specialist and is waiting for an appointment.  His PCP does not prescribe narcotics.  Went to the ED on 05/18/13 for blurred vision in right eye, similar to prior exacerbations.  No imaging was performed and no steroids were initiated.  His main  concern is his uncontrolled chronic pain.  He presented to the ED the following day for the pain.  He reports that he was discharged from the pain clinic due to disagreements with the physician.  He does walk but often uses a motorized wheelchair because he has difficulty ambulating due to a bad right knee.  10/25/05 MRI BRAIN W/WO to evaluate for a presumed MS exacerbation was normal. 04/12/13 TSH 2.58 08/26/09 ANA negative, ESR 53 08/23/09 B12 408  PAST MEDICAL HISTORY: Past Medical History  Diagnosis Date  . Fibromyalgia   . Multiple sclerosis     with optic neuritis  . Narcolepsy   . Scoliosis   . Hypothyroidism   . HLD (hyperlipidemia)   . Anemia of chronic disease   . Thrombocytopenia   . Pulmonary embolism     "suspected; never confirmed"--? treated with coumadin  . COPD (chronic obstructive pulmonary disease)   . Insomnia   . Carpal tunnel syndrome   . GERD (gastroesophageal reflux disease)   . BPH (benign prostatic hypertrophy)   . History of gout   . Neurogenic bladder     chronic indwelling foley catheter-changed monthly  . Optic neuritis   . History of epistaxis   . History of MRSA infection 2012    UTI, bacteremia, and endocarditis  . Endocarditis 2012  . Supraspinatus tendon tear 2012  . Heart murmur   . Myocardial infarction     "according to stress test; that's news to me"  . Exertional dyspnea   .  OSA (obstructive sleep apnea)     "tried CPAP; lost more sleep w/use"  . History of blood transfusion     "gave me too much coumadin and heparin once"  . H/O hiatal hernia   . Osteoarthritis of multiple joints     "everywhere"  . DDD (degenerative disc disease)     knees, hips, shoulders--Dr. Ronnie Derby [facility] prior to this)  . CAD (coronary artery disease) 11/2011    Stents x 3  . Anxiety and depression   . Steroid-induced psychosis   . History of renal failure 2011    "on dialysis 3 times over 2 years"    PAST SURGICAL HISTORY: Past Surgical History    Procedure Laterality Date  . Carpal tunnel release  ~ 2010    left with radius fracture repair  . Nasal cautery    . Skin graft  04/2009    right thigh to right "wrist all the way to my shoulder; necrotizing fasciitis  . Colonoscopy  2000; 2005; 03/15/2011    diminutve adenoma--recall 2017  . Coronary angioplasty  11/22/2011    3 vessels  . Coronary angioplasty with stent placement  11/22/2011    DES to LAD  . Knee arthroscopy w/ meniscal repair  1990's    right    MEDICATIONS: Current Outpatient Prescriptions on File Prior to Visit  Medication Sig Dispense Refill  . acetaminophen (TYLENOL) 500 MG tablet Take 500 mg by mouth every 4 (four) hours as needed for mild pain, fever or headache.      . Armodafinil (NUVIGIL) 250 MG tablet Take 250 mg by mouth daily.      Marland Kitchen aspirin 81 MG chewable tablet Chew 81 mg by mouth daily.      . busPIRone (BUSPAR) 15 MG tablet Take 15 mg by mouth 2 (two) times daily.       . calcium acetate (PHOSLO) 667 MG capsule Take 667 mg by mouth daily as needed (for indigestion).      . carvedilol (COREG) 6.25 MG tablet Take 6.25 mg by mouth 2 (two) times daily with a meal.      . clonazePAM (KLONOPIN) 0.5 MG tablet Take 0.25 mg by mouth at bedtime.       . cloNIDine (CATAPRES - DOSED IN MG/24 HR) 0.1 mg/24hr patch Place 0.1 mg onto the skin once a week.       . DULoxetine (CYMBALTA) 60 MG capsule Take 60 mg by mouth 2 (two) times daily.      Marland Kitchen guaifenesin (ROBITUSSIN) 100 MG/5ML syrup Take 200 mg by mouth every 6 (six) hours as needed for cough.      Marland Kitchen HYDROcodone-acetaminophen (NORCO/VICODIN) 5-325 MG per tablet Take 1 tablet by mouth every 6 (six) hours as needed for moderate pain.      Marland Kitchen loperamide (IMODIUM) 2 MG capsule Take 2 mg by mouth as needed for diarrhea or loose stools (As needed with each loose stool for diarrhea).      . magnesium hydroxide (MILK OF MAGNESIA) 400 MG/5ML suspension Take 30 mLs by mouth at bedtime as needed for mild constipation.       . Multiple Vitamins-Minerals (THEREMS-M) TABS Take 1 tablet by mouth daily.      . nitroGLYCERIN (NITROSTAT) 0.3 MG SL tablet Place 0.4 mg under the tongue every 5 (five) minutes as needed for chest pain.      . vitamin C (ASCORBIC ACID) 500 MG tablet Take 1,000 mg by mouth 2 (two) times daily.       Marland Kitchen  clonazePAM (KLONOPIN) 0.5 MG tablet Take 0.5 mg by mouth 2 (two) times daily as needed for anxiety.       No current facility-administered medications on file prior to visit.    ALLERGIES: Allergies  Allergen Reactions  . Ultram [Tramadol Hcl] Other (See Comments)    Seizures; "grand mal; twice"    FAMILY HISTORY: Family History  Problem Relation Age of Onset  . Alzheimer's disease Mother   . Colon cancer Neg Hx     SOCIAL HISTORY: History   Social History  . Marital Status: Single    Spouse Name: N/A    Number of Children: N/A  . Years of Education: N/A   Occupational History  . Not on file.   Social History Main Topics  . Smoking status: Current Every Day Smoker -- 1.00 packs/day for 40 years    Types: Cigarettes  . Smokeless tobacco: Never Used     Comment: Counseling sheet to quit smoking given in exam room   . Alcohol Use: Yes     Comment: 11/22/2011 "like crazy til 1988; nothing since"  . Drug Use: Yes    Special: Cocaine     Comment: *8//13/13 "1986 til 1988 like crazy; nothing since"  . Sexual Activity: Not Currently   Other Topics Concern  . Not on file   Social History Narrative   Never married, no children.   Orig from [place], [place], lived a long time in [place].   Has been in [place] since about 2004.   College at [employer] "for years".   Denies alcohol or drug use.   Cigarettes: 50 pack-yr hx--current as of 03/2013.    REVIEW OF SYSTEMS: Constitutional: No fevers, chills, or sweats, no generalized fatigue, change in appetite Eyes: blurred vision Ear, nose and throat: No hearing loss, ear pain, nasal congestion, sore throat Cardiovascular: No  chest pain, palpitations Respiratory:  No shortness of breath at rest or with exertion, wheezes GastrointestinaI: No nausea, vomiting, diarrhea, abdominal pain, fecal incontinence Genitourinary:  No dysuria, urinary retention or frequency Musculoskeletal:  Diffuse pain Integumentary: No rash, pruritus, skin lesions Neurological: as above Psychiatric: No depression, insomnia, anxiety Endocrine: No palpitations, fatigue, diaphoresis, mood swings, change in appetite, change in weight, increased thirst Hematologic/Lymphatic:  No anemia, purpura, petechiae. Allergic/Immunologic: no itchy/runny eyes, nasal congestion, recent allergic reactions, rashes  PHYSICAL EXAM: Filed Vitals:   05/24/13 1006  BP: 148/84  Pulse: 90   General: No acute distress Head:  Normocephalic/atraumatic Neck: supple, no paraspinal tenderness, full range of motion Back: No paraspinal tenderness Heart: regular rate and rhythm Lungs: Clear to auscultation bilaterally. Vascular: No carotid bruits. Neurological Exam: Mental status: alert and oriented to person, place, and time, speech fluent and not dysarthric, language intact. Cranial nerves: CN I: not tested CN II: pupils equal, round and reactive to light, visual fields intact, fundi unremarkable. CN III, IV, VI:  full range of motion, no nystagmus, no ptosis CN V: endorses about 10% sensation loss on left. CN VII: upper and lower face symmetric CN VIII: hearing intact CN IX, X: gag intact, uvula midline CN XI: sternocleidomastoid and trapezius muscles intact CN XII: tongue midline Bulk & Tone: normal, no fasciculations. Motor: 5/5 throughout Sensation: endorses reduced pinprick in first toe of left foot.  Vibration intact. Deep Tendon Reflexes: brisk throughout and symmetric.  Toes down. Finger to nose testing: no dysmetria Heel to shin: no dysmetria Gait: right leg limp. Romberg negative.  IMPRESSION: 1.  History of multiple sclerosis.  Symptomatically, seems stable.  No abnormalities on brain MRI in 2007.  With history of transverse myelitis and recurrent optic neuritis, consider NMO. 2.  Chronic pain syndrome.  PLAN: 1.  Will get MRI of brain, cervical and thoracic spines with and without contrast 2.  Consider checking NMO antibodies (will wait and see what MRI shows). 3.  Treatment of chronic pain, especially with narcotics, is out of the scope of my practice.  Defer to PCP for pain management while waiting to see pain specialist. 4.  Follow up in 3 months to discuss further management  45 minutes spent with patient, over 50% spent counseling and coordinating care.  Thank you for allowing me to take part in the care of this patient.  Metta Clines, DO  CC:  Shawnie Dapper, MD

## 2013-05-24 NOTE — Discharge Instructions (Signed)
Please follow up with pain management or your primary care doctor for management of your pain. Follow up with your doctor.   Chronic Pain Chronic pain can be defined as pain that is off and on and lasts for 3 6 months or longer. Many things cause chronic pain, which can make it difficult to make a diagnosis. There are many treatment options available for chronic pain. However, finding a treatment that works well for you may require trying various approaches until the right one is found. Many people benefit from a combination of two or more types of treatment to control their pain. SYMPTOMS  Chronic pain can occur anywhere in the body and can range from mild to very severe. Some types of chronic pain include:  Headache.  Low back pain.  Cancer pain.  Arthritis pain.  Neurogenic pain. This is pain resulting from damage to nerves. People with chronic pain may also have other symptoms such as:  Depression.  Anger.  Insomnia.  Anxiety. DIAGNOSIS  Your health care provider will help diagnose your condition over time. In many cases, the initial focus will be on excluding possible conditions that could be causing the pain. Depending on your symptoms, your health care provider may order tests to diagnose your condition. Some of these tests may include:   Blood tests.   CT scan.   MRI.   X-rays.   Ultrasounds.   Nerve conduction studies.  You may need to see a specialist.  TREATMENT  Finding treatment that works well may take time. You may be referred to a pain specialist. He or she may prescribe medicine or therapies, such as:   Mindful meditation or yoga.  Shots (injections) of numbing or pain-relieving medicines into the spine or area of pain.  Local electrical stimulation.  Acupuncture.   Massage therapy.   Aroma, color, light, or sound therapy.   Biofeedback.   Working with a physical therapist to keep from getting stiff.   Regular, gentle exercise.    Cognitive or behavioral therapy.   Group support.  Sometimes, surgery may be recommended.  HOME CARE INSTRUCTIONS   Take all medicines as directed by your health care provider.   Lessen stress in your life by relaxing and doing things such as listening to calming music.   Exercise or be active as directed by your health care provider.   Eat a healthy diet and include things such as vegetables, fruits, fish, and lean meats in your diet.   Keep all follow-up appointments with your health care provider.   Attend a support group with others suffering from chronic pain. SEEK MEDICAL CARE IF:   Your pain gets worse.   You develop a new pain that was not there before.   You cannot tolerate medicines given to you by your health care provider.   You have new symptoms since your last visit with your health care provider.  SEEK IMMEDIATE MEDICAL CARE IF:   You feel weak.   You have decreased sensation or numbness.   You lose control of bowel or bladder function.   Your pain suddenly gets much worse.   You develop shaking.  You develop chills.  You develop confusion.  You develop chest pain.  You develop shortness of breath.  MAKE SURE YOU:  Understand these instructions.  Will watch your condition.  Will get help right away if you are not doing well or get worse. Document Released: 12/18/2001 Document Revised: 11/28/2012 Document Reviewed: 09/21/2012 ExitCare Patient Information  2014 [place], [place].

## 2013-05-25 ENCOUNTER — Emergency Department (HOSPITAL_COMMUNITY): Payer: Medicare Other

## 2013-05-25 ENCOUNTER — Emergency Department (HOSPITAL_COMMUNITY)
Admission: EM | Admit: 2013-05-25 | Discharge: 2013-05-25 | Disposition: A | Discharge status: Skilled Nursing Facility | Payer: Medicare Other | Attending: Emergency Medicine | Admitting: Emergency Medicine

## 2013-05-25 ENCOUNTER — Encounter (HOSPITAL_COMMUNITY): Payer: Self-pay | Admitting: Emergency Medicine

## 2013-05-25 DIAGNOSIS — F3289 Other specified depressive episodes: Secondary | ICD-10-CM | POA: Insufficient documentation

## 2013-05-25 DIAGNOSIS — F17200 Nicotine dependence, unspecified, uncomplicated: Secondary | ICD-10-CM | POA: Insufficient documentation

## 2013-05-25 DIAGNOSIS — M159 Polyosteoarthritis, unspecified: Secondary | ICD-10-CM | POA: Insufficient documentation

## 2013-05-25 DIAGNOSIS — Z7982 Long term (current) use of aspirin: Secondary | ICD-10-CM | POA: Insufficient documentation

## 2013-05-25 DIAGNOSIS — Z8614 Personal history of Methicillin resistant Staphylococcus aureus infection: Secondary | ICD-10-CM | POA: Insufficient documentation

## 2013-05-25 DIAGNOSIS — G4733 Obstructive sleep apnea (adult) (pediatric): Secondary | ICD-10-CM | POA: Insufficient documentation

## 2013-05-25 DIAGNOSIS — Z79899 Other long term (current) drug therapy: Secondary | ICD-10-CM | POA: Insufficient documentation

## 2013-05-25 DIAGNOSIS — I2510 Atherosclerotic heart disease of native coronary artery without angina pectoris: Secondary | ICD-10-CM | POA: Insufficient documentation

## 2013-05-25 DIAGNOSIS — G4700 Insomnia, unspecified: Secondary | ICD-10-CM | POA: Insufficient documentation

## 2013-05-25 DIAGNOSIS — J449 Chronic obstructive pulmonary disease, unspecified: Secondary | ICD-10-CM | POA: Insufficient documentation

## 2013-05-25 DIAGNOSIS — F329 Major depressive disorder, single episode, unspecified: Secondary | ICD-10-CM | POA: Insufficient documentation

## 2013-05-25 DIAGNOSIS — IMO0001 Reserved for inherently not codable concepts without codable children: Secondary | ICD-10-CM | POA: Insufficient documentation

## 2013-05-25 DIAGNOSIS — I252 Old myocardial infarction: Secondary | ICD-10-CM | POA: Insufficient documentation

## 2013-05-25 DIAGNOSIS — R011 Cardiac murmur, unspecified: Secondary | ICD-10-CM | POA: Insufficient documentation

## 2013-05-25 DIAGNOSIS — Z111 Encounter for screening for respiratory tuberculosis: Secondary | ICD-10-CM | POA: Insufficient documentation

## 2013-05-25 DIAGNOSIS — Z9861 Coronary angioplasty status: Secondary | ICD-10-CM | POA: Insufficient documentation

## 2013-05-25 DIAGNOSIS — F411 Generalized anxiety disorder: Secondary | ICD-10-CM | POA: Insufficient documentation

## 2013-05-25 DIAGNOSIS — Z862 Personal history of diseases of the blood and blood-forming organs and certain disorders involving the immune mechanism: Secondary | ICD-10-CM | POA: Insufficient documentation

## 2013-05-25 DIAGNOSIS — Z8639 Personal history of other endocrine, nutritional and metabolic disease: Secondary | ICD-10-CM | POA: Insufficient documentation

## 2013-05-25 DIAGNOSIS — Z86711 Personal history of pulmonary embolism: Secondary | ICD-10-CM | POA: Insufficient documentation

## 2013-05-25 DIAGNOSIS — G8929 Other chronic pain: Secondary | ICD-10-CM | POA: Insufficient documentation

## 2013-05-25 DIAGNOSIS — Z8719 Personal history of other diseases of the digestive system: Secondary | ICD-10-CM | POA: Insufficient documentation

## 2013-05-25 DIAGNOSIS — Z87448 Personal history of other diseases of urinary system: Secondary | ICD-10-CM | POA: Insufficient documentation

## 2013-05-25 DIAGNOSIS — Z87828 Personal history of other (healed) physical injury and trauma: Secondary | ICD-10-CM | POA: Insufficient documentation

## 2013-05-25 DIAGNOSIS — J4489 Other specified chronic obstructive pulmonary disease: Secondary | ICD-10-CM | POA: Insufficient documentation

## 2013-05-25 NOTE — ED Notes (Signed)
He was recently found to have a positive ppd test at the long-term care facility at which he resides [facility]); and they are requesting chest x-ray.

## 2013-05-25 NOTE — ED Notes (Signed)
Patient refused to get into a hpwn until he talked to a doctor.

## 2013-05-25 NOTE — ED Provider Notes (Signed)
CSN: [id number]     Arrival date & time 05/25/13  1548 History   First MD Initiated Contact with Patient 05/25/13 1558     Chief Complaint  Patient presents with  . Results     (Consider location/radiation/quality/duration/timing/severity/associated sxs/prior Treatment) HPI Comments: 63 yo male presenting from his nursing facility to obtain a chest xray due to a reportedly positive PPD skin test.  Pt states he is suffering from his chronic pain, but denies fevers, cough, or other symptoms .   Per EMS and nursing staff, PPD was placed 3 days ago on left forearm.  It was read as positive by the nursing facility and their protocol is to send to ED for a chest xray.     Past Medical History  Diagnosis Date  . Fibromyalgia   . Multiple sclerosis     with optic neuritis  . Narcolepsy   . Scoliosis   . Hypothyroidism   . HLD (hyperlipidemia)   . Anemia of chronic disease   . Thrombocytopenia   . Pulmonary embolism     "suspected; never confirmed"--? treated with coumadin  . COPD (chronic obstructive pulmonary disease)   . Insomnia   . Carpal tunnel syndrome   . GERD (gastroesophageal reflux disease)   . BPH (benign prostatic hypertrophy)   . History of gout   . Neurogenic bladder     chronic indwelling foley catheter-changed monthly  . Optic neuritis   . History of epistaxis   . History of MRSA infection 2012    UTI, bacteremia, and endocarditis  . Endocarditis 2012  . Supraspinatus tendon tear 2012  . Heart murmur   . Myocardial infarction     "according to stress test; that's news to me"  . Exertional dyspnea   . OSA (obstructive sleep apnea)     "tried CPAP; lost more sleep w/use"  . History of blood transfusion     "gave me too much coumadin and heparin once"  . H/O hiatal hernia   . Osteoarthritis of multiple joints     "everywhere"  . DDD (degenerative disc disease)     knees, hips, shoulders--Dr. Ronnie Derby [facility] prior to this)  . CAD (coronary artery disease) 11/2011     Stents x 3  . Anxiety and depression   . Steroid-induced psychosis   . History of renal failure 2011    "on dialysis 3 times over 2 years"   Past Surgical History  Procedure Laterality Date  . Carpal tunnel release  ~ 2010    left with radius fracture repair  . Nasal cautery    . Skin graft  04/2009    right thigh to right "wrist all the way to my shoulder; necrotizing fasciitis  . Colonoscopy  2000; 2005; 03/15/2011    diminutve adenoma--recall 2017  . Coronary angioplasty  11/22/2011    3 vessels  . Coronary angioplasty with stent placement  11/22/2011    DES to LAD  . Knee arthroscopy w/ meniscal repair  34's    right   Family History  Problem Relation Age of Onset  . Alzheimer's disease Mother   . Colon cancer Neg Hx    History  Substance Use Topics  . Smoking status: Current Every Day Smoker -- 0.50 packs/day for 40 years    Types: Cigarettes  . Smokeless tobacco: Never Used     Comment: Counseling sheet to quit smoking given in exam room   . Alcohol Use: No     Comment: 11/22/2011 "  like crazy til 1988; nothing since"    Review of Systems  All other systems reviewed and are negative.      Allergies  Ultram  Home Medications   Current Outpatient Rx  Name  Route  Sig  Dispense  Refill  . acetaminophen (TYLENOL) 500 MG tablet   Oral   Take 500 mg by mouth every 4 (four) hours as needed for mild pain, fever or headache.         . Armodafinil (NUVIGIL) 250 MG tablet   Oral   Take 250 mg by mouth daily.         Marland Kitchen aspirin 81 MG chewable tablet   Oral   Chew 81 mg by mouth every evening.          . busPIRone (BUSPAR) 15 MG tablet   Oral   Take 15 mg by mouth 2 (two) times daily.          . calcium acetate (PHOSLO) 667 MG capsule   Oral   Take 667 mg by mouth daily as needed (for indigestion).         . carvedilol (COREG) 6.25 MG tablet   Oral   Take 6.25 mg by mouth 2 (two) times daily.          . clonazePAM (KLONOPIN) 0.5 MG  tablet   Oral   Take 0.5 mg by mouth 2 (two) times daily as needed for anxiety.         . cloNIDine (CATAPRES - DOSED IN MG/24 HR) 0.1 mg/24hr patch   Transdermal   Place 0.1 mg onto the skin once a week. On wednesdays         . DULoxetine (CYMBALTA) 60 MG capsule   Oral   Take 60 mg by mouth 2 (two) times daily.         . Fluticasone-Salmeterol (ADVAIR) 250-50 MCG/DOSE AEPB   Inhalation   Inhale 1 puff into the lungs 2 (two) times daily.         Marland Kitchen guaifenesin (ROBITUSSIN) 100 MG/5ML syrup   Oral   Take 200 mg by mouth every 6 (six) hours as needed for cough.         Marland Kitchen HYDROcodone-acetaminophen (NORCO/VICODIN) 5-325 MG per tablet   Oral   Take 1 tablet by mouth every 6 (six) hours as needed for moderate pain.         Marland Kitchen loperamide (IMODIUM) 2 MG capsule   Oral   Take 2 mg by mouth as needed for diarrhea or loose stools.          . magnesium hydroxide (MILK OF MAGNESIA) 400 MG/5ML suspension   Oral   Take 30 mLs by mouth at bedtime as needed for mild constipation.         . Multiple Vitamins-Minerals (THEREMS-M) TABS   Oral   Take 1 tablet by mouth daily.         . nitroGLYCERIN (NITROSTAT) 0.3 MG SL tablet   Sublingual   Place 0.4 mg under the tongue every 5 (five) minutes as needed for chest pain.         . vitamin C (ASCORBIC ACID) 500 MG tablet   Oral   Take 1,000 mg by mouth 2 (two) times daily.           BP 152/91  Pulse 65  Temp(Src) 98.3 F (36.8 C) (Oral)  Resp 20  SpO2 98% Physical Exam  Nursing note and vitals reviewed. Constitutional: He is  oriented to person, place, and time. He appears well-developed and well-nourished. No distress.  HENT:  Head: Normocephalic and atraumatic.  Eyes: Conjunctivae are normal. No scleral icterus.  Neck: Neck supple.  Cardiovascular: Normal rate and intact distal pulses.   Pulmonary/Chest: Effort normal. No stridor. No respiratory distress.  Abdominal: Normal appearance. He exhibits no  distension.  Neurological: He is alert and oriented to person, place, and time.  Skin: Skin is warm and dry. No rash noted.  Left forearm.  Approximately 4-5 mm area of erythema/ecchymosis with no palpable induration.   Psychiatric: He has a normal mood and affect. His behavior is normal.    ED Course  Procedures (including critical care time) Labs Review Labs Reviewed - No data to display Imaging Review Dg Chest 2 View  05/25/2013   CLINICAL DATA:  Tuberculosis screening  EXAM: CHEST  2 VIEW  COMPARISON:  DG CHEST 2 VIEW dated 01/13/2011  FINDINGS: The heart size and mediastinal contours are within normal limits. Both lungs are clear. The visualized skeletal structures are unremarkable. Hyperinflation is compatible with COPD.  IMPRESSION: No active cardiopulmonary disease. Hyperinflation again noted compatible with COPD.   Electronically Signed   By: Conchita Paris M.D.   On: 05/25/2013 17:17  All radiology studies independently viewed by me.     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  I do not appreciate any induration.  However, as the emergency department did not place test, we will defer to the nursing facility's reading (taking in to account public health risk of undiagnosed TB).  Pt states "I just want to get the xray so I can go home!"  5:29 PM CXR negative.  DC'd back to facility.    Houston Siren III, MD 05/25/13 [phone]

## 2013-05-25 NOTE — Discharge Instructions (Signed)
Your Chest X ray showed stable changes consistent with COPD, but did not show any evidence of Tuberculosis.

## 2013-05-25 NOTE — ED Notes (Signed)
Spoke to nurse at [facility] had PPD placed on 05/22/13 and was read today by nurse from [facility] [place]. The nurse stated the PPD was positive and needed chest x-ray. On assessment there is no induration noted at PPD site area is red. Pt is refusing chest x-ray at this time if it is not needed. MD to follow up with patient.

## 2013-05-25 NOTE — ED Provider Notes (Signed)
Medical screening examination/treatment/procedure(s) were performed by non-physician practitioner and as supervising physician I was immediately available for consultation/collaboration.  EKG Interpretation   None         Alfonzo Feller, DO 05/25/13 1131

## 2013-06-03 ENCOUNTER — Telehealth: Payer: Self-pay | Admitting: Neurology

## 2013-06-03 NOTE — Telephone Encounter (Signed)
Patient is wanting to know what type of blood you were wanting to order he would like to have that done at the ALF he is at if possible .Please advise

## 2013-06-03 NOTE — Telephone Encounter (Signed)
Pt called wanting Dr. Tomi Likens to request lab orders for him.  Please call pt since he does not know what type of lab order it is.

## 2013-06-03 NOTE — Telephone Encounter (Signed)
I wanted to get the MRIs of the brain, cervical and thoracic spines with and without contrast first, but that doesn't look like it was done.  Do we know what happened with this?

## 2013-06-03 NOTE — Telephone Encounter (Signed)
He is already  scheduled for MRI on 06/06/13

## 2013-06-05 ENCOUNTER — Telehealth: Payer: Self-pay | Admitting: Family Medicine

## 2013-06-05 ENCOUNTER — Telehealth: Payer: Self-pay | Admitting: Neurology

## 2013-06-05 MED ORDER — DULOXETINE HCL 60 MG PO CPEP: 60.0000 mg | ORAL_CAPSULE | Freq: Two times a day (BID) | ORAL | Status: DC

## 2013-06-05 MED ORDER — CLONAZEPAM 0.5 MG PO TABS: 0.5000 mg | ORAL_TABLET | Freq: Two times a day (BID) | ORAL | Status: DC | PRN

## 2013-06-05 NOTE — Telephone Encounter (Signed)
Refill request for klonopin.  I don't see that you ever Rx'ed this medication in chart.  Please advise.

## 2013-06-05 NOTE — Telephone Encounter (Signed)
Patient is unable to keep MRI on 06/06/13

## 2013-06-05 NOTE — Telephone Encounter (Signed)
MRI  Head T and C spine cancelled until 06/24/13 at 7:45am patient is aware

## 2013-06-05 NOTE — Telephone Encounter (Signed)
Rx faxed to [facility].

## 2013-06-05 NOTE — Telephone Encounter (Signed)
Clonazepam rx printed (0.5mg , 1 bid prn, #60, no RF).

## 2013-06-05 NOTE — Telephone Encounter (Signed)
Cant make the MRI and would like you to resch it

## 2013-06-05 NOTE — Telephone Encounter (Signed)
So let's reschedule it.

## 2013-06-06 ENCOUNTER — Ambulatory Visit (HOSPITAL_COMMUNITY): Payer: Medicare Other

## 2013-06-06 ENCOUNTER — Ambulatory Visit (HOSPITAL_COMMUNITY): Admission: RE | Admit: 2013-06-06 | Payer: Medicare Other | Source: Ambulatory Visit

## 2013-06-24 ENCOUNTER — Ambulatory Visit (HOSPITAL_COMMUNITY)
Admission: RE | Admit: 2013-06-24 | Discharge: 2013-06-24 | Disposition: A | Discharge status: Home / Self Care | Payer: Medicare Other | Source: Ambulatory Visit | Attending: Neurology | Admitting: Neurology

## 2013-06-24 ENCOUNTER — Other Ambulatory Visit: Payer: Self-pay | Admitting: Neurology

## 2013-06-24 ENCOUNTER — Telehealth: Payer: Self-pay | Admitting: Family Medicine

## 2013-06-24 DIAGNOSIS — G35 Multiple sclerosis: Secondary | ICD-10-CM

## 2013-06-24 DIAGNOSIS — R29898 Other symptoms and signs involving the musculoskeletal system: Secondary | ICD-10-CM | POA: Insufficient documentation

## 2013-06-24 DIAGNOSIS — M47812 Spondylosis without myelopathy or radiculopathy, cervical region: Secondary | ICD-10-CM | POA: Insufficient documentation

## 2013-06-24 NOTE — Telephone Encounter (Signed)
Sorry,  I meant to say the fentynal patches.

## 2013-06-24 NOTE — Telephone Encounter (Signed)
Patient states that he went to [facility] for pain management, however,  Pt states that the dr there was misinformed about pt's clonidine patch.  Patient states that he is supposed to be taking two clonidine patches and dr will only rx one patch.  Patient states he will not be going back to that office.

## 2013-06-24 NOTE — Telephone Encounter (Signed)
Patient states that he went to [facility] for pain management, however,  Pt states that the dr there was misinformed about pt's clonidine patch.  Patient states that he is supposed to be taking two clonidine patches and dr will only rx one patch.  Patient states he will not be going back to that office.  

## 2013-06-25 ENCOUNTER — Telehealth: Payer: Self-pay | Admitting: Neurology

## 2013-06-25 NOTE — Telephone Encounter (Signed)
I don't think the headache is related to the MRI.  The MRIs look normal.

## 2013-06-25 NOTE — Telephone Encounter (Signed)
Pt had 3 MRI's yesterday and he states that he feels that it is odd that he had dull headaches.

## 2013-06-25 NOTE — Telephone Encounter (Signed)
I resch appt for march 27 at 10:00 i left the may appt on the book just incase he wanted to see him in may but we can cancel if we need to

## 2013-06-26 NOTE — Telephone Encounter (Signed)
Noted  

## 2013-06-26 NOTE — Telephone Encounter (Signed)
Patient  Wife was ask to return call to office

## 2013-06-26 NOTE — Telephone Encounter (Signed)
Pt is returning your call please call [phone]

## 2013-07-01 ENCOUNTER — Telehealth: Payer: Self-pay | Admitting: Neurology

## 2013-07-01 NOTE — Telephone Encounter (Signed)
Pt has asked [name] to release his MRI results (from [facility]) to his MyChart account. He would like to view the results / Dale S.

## 2013-07-02 NOTE — Telephone Encounter (Signed)
Returned patients call did not leave voicemail due to mail box not set up

## 2013-07-03 NOTE — Telephone Encounter (Signed)
MRI has been released for patient to view

## 2013-07-05 ENCOUNTER — Encounter: Payer: Self-pay | Admitting: Neurology

## 2013-07-05 ENCOUNTER — Ambulatory Visit (INDEPENDENT_AMBULATORY_CARE_PROVIDER_SITE_OTHER): Payer: Medicare Other | Admitting: Neurology

## 2013-07-05 VITALS — BP 128/70 | HR 68 | Resp 18 | Ht 70.0 in | Wt 214.0 lb

## 2013-07-05 DIAGNOSIS — G379 Demyelinating disease of central nervous system, unspecified: Secondary | ICD-10-CM

## 2013-07-05 NOTE — Patient Instructions (Signed)
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  We will try and get the blood test approved.  If they don't approve it, I wouldn't worry too much because your disease as been stable for many years.

## 2013-07-05 NOTE — Progress Notes (Signed)
NEUROLOGY FOLLOW UP OFFICE NOTE  Dale Shields [id number]  HISTORY OF PRESENT ILLNESS: Dale Shields is a 63 year old right-handed man with history of multiple sclerosis, obesity, insomnia, fibromyalgia, COPD, sleep apnea, hypothyroidism, thrombocytopenia, endocarditis, hyperlipidemia, CAD with stents x3, neurogenic bladder, arthritis and history of renal failure who follows up for history of multiple sclerosis.  Records and images were personally reviewed where available.    UPDATE: MRI with and without contrast of the neuro-axis (brain, cervical, thoracic) was performed on 06/24/13.  There was no evidence of acute or chronic demyelinating plaques.  However, there is some mild atrophy of both chiasmatic and pre-chiasmatic optic nerve atrophy.  There is mild thinning of the corpus callosum stable compared to prior imaging from 2007.  Very minimal T2 and FLAIR hyperintensities are seen around the lateral ventricles, but are not impressive and not characteristic for a demyelinating disease.  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  At age 73, he had transient blindness in both eyes.  At age 35, he had transient blindness in one eye.  4 years later, he had transient blindness again in both eyes.  He was symptom-free up until around 1990.  He had developed extreme fatigued.  He was evaluated at [facility], where he had an LP and was told that he had MS.  Over the years, he had been on Avonex, Betaseron and Copaxone at some point.  He has not been on any disease-modifying agents for several years.  He was last evaluated by a neurologist in [facility], who told him that he probably doesn't need any treatment at this point.  He really hasn't had any flare ups.  Two years ago, he had problems with his balance but was found to have a UTI.  The other day, he developed blurred vision in his right eye, which he thinks may have been related to stress.   He also has history of chronic pain.  He has been on several pain medications, including controlled substances.  He is currently not followed by a pain specialist and is waiting for an appointment.  His PCP does not prescribe narcotics.  Went to the ED on 05/18/13 for blurred vision in right eye, similar to prior exacerbations.  No imaging was performed and no steroids were initiated.  His main concern is his uncontrolled chronic pain.  He presented to the ED the following day for the pain.  He reports that he was discharged from the pain clinic due to disagreements with the physician.  He does walk but often uses a motorized wheelchair because he has difficulty ambulating due to a bad right knee.  10/25/05 MRI BRAIN W/WO to evaluate for a presumed MS exacerbation was normal. 04/12/13 TSH 2.58 08/26/09 ANA negative, ESR 53 08/23/09 B12 408  PAST MEDICAL HISTORY: Past Medical History  Diagnosis Date  . Fibromyalgia   . Multiple sclerosis     with optic neuritis  . Narcolepsy   . Scoliosis   . Hypothyroidism   . HLD (hyperlipidemia)   . Anemia of chronic disease   . Thrombocytopenia   . Pulmonary embolism     "suspected; never confirmed"--? treated with coumadin  . COPD (chronic obstructive pulmonary disease)   . Insomnia   . Carpal tunnel syndrome   . GERD (gastroesophageal reflux disease)   . BPH (benign prostatic hypertrophy)   . History of gout   . Neurogenic bladder     chronic indwelling  foley catheter-changed monthly  . Optic neuritis   . History of epistaxis   . History of MRSA infection 2012    UTI, bacteremia, and endocarditis  . Endocarditis 2012  . Supraspinatus tendon tear 2012  . Heart murmur   . Myocardial infarction     "according to stress test; that's news to me"  . Exertional dyspnea   . OSA (obstructive sleep apnea)     "tried CPAP; lost more sleep w/use"  . History of blood transfusion     "gave me too much coumadin and heparin once"  . H/O hiatal hernia   .  Osteoarthritis of multiple joints     "everywhere"  . DDD (degenerative disc disease)     knees, hips, shoulders--Dr. Ronnie Derby [facility] prior to this)  . CAD (coronary artery disease) 11/2011    Stents x 3  . Anxiety and depression   . Steroid-induced psychosis   . History of renal failure 2011    "on dialysis 3 times over 2 years"    MEDICATIONS: Current Outpatient Prescriptions on File Prior to Visit  Medication Sig Dispense Refill  . acetaminophen (TYLENOL) 500 MG tablet Take 500 mg by mouth every 4 (four) hours as needed for mild pain, fever or headache.      . Armodafinil (NUVIGIL) 250 MG tablet Take 250 mg by mouth daily.      Marland Kitchen aspirin 81 MG chewable tablet Chew 81 mg by mouth every evening.       . busPIRone (BUSPAR) 15 MG tablet Take 15 mg by mouth 2 (two) times daily.       . calcium acetate (PHOSLO) 667 MG capsule Take 667 mg by mouth daily as needed (for indigestion).      . carvedilol (COREG) 6.25 MG tablet Take 6.25 mg by mouth 2 (two) times daily.       . clonazePAM (KLONOPIN) 0.5 MG tablet Take 1 tablet (0.5 mg total) by mouth 2 (two) times daily as needed for anxiety.  60 tablet  0  . cloNIDine (CATAPRES - DOSED IN MG/24 HR) 0.1 mg/24hr patch Place 0.1 mg onto the skin once a week. On wednesdays      . DULoxetine (CYMBALTA) 60 MG capsule Take 1 capsule (60 mg total) by mouth 2 (two) times daily.  60 capsule  1  . Fluticasone-Salmeterol (ADVAIR) 250-50 MCG/DOSE AEPB Inhale 1 puff into the lungs 2 (two) times daily.      Marland Kitchen guaifenesin (ROBITUSSIN) 100 MG/5ML syrup Take 200 mg by mouth every 6 (six) hours as needed for cough.      . loperamide (IMODIUM) 2 MG capsule Take 2 mg by mouth as needed for diarrhea or loose stools.       . magnesium hydroxide (MILK OF MAGNESIA) 400 MG/5ML suspension Take 30 mLs by mouth at bedtime as needed for mild constipation.      . Multiple Vitamins-Minerals (THEREMS-M) TABS Take 1 tablet by mouth daily.      . nitroGLYCERIN (NITROSTAT) 0.3 MG SL  tablet Place 0.4 mg under the tongue every 5 (five) minutes as needed for chest pain.      . vitamin C (ASCORBIC ACID) 500 MG tablet Take 1,000 mg by mouth 2 (two) times daily.        No current facility-administered medications on file prior to visit.    ALLERGIES: Allergies  Allergen Reactions  . Ultram [Tramadol Hcl] Other (See Comments)    Seizures; "grand mal; twice"    FAMILY HISTORY:  Family History  Problem Relation Age of Onset  . Alzheimer's disease Mother   . Colon cancer Neg Hx     SOCIAL HISTORY: History   Social History  . Marital Status: Single    Spouse Name: N/A    Number of Children: N/A  . Years of Education: N/A   Occupational History  . Not on file.   Social History Main Topics  . Smoking status: Current Every Day Smoker -- 0.50 packs/day for 40 years    Types: Cigarettes  . Smokeless tobacco: Never Used     Comment: Counseling sheet to quit smoking given in exam room   . Alcohol Use: No     Comment: 11/22/2011 "like crazy til 1988; nothing since"  . Drug Use: No     Comment: *8//13/13 "1986 til 1988 like crazy; nothing since"  . Sexual Activity: Not Currently   Other Topics Concern  . Not on file   Social History Narrative   Never married, no children.   Orig from [place], [place], lived a long time in [place].   Has been in [place] since about 2004.   College at [employer] "for years".   Denies alcohol or drug use.   Cigarettes: 50 pack-yr hx--current as of 03/2013.    REVIEW OF SYSTEMS: Constitutional: No fevers, chills, or sweats, no generalized fatigue, change in appetite Eyes: No visual changes, double vision, eye pain Ear, nose and throat: No hearing loss, ear pain, nasal congestion, sore throat Cardiovascular: No chest pain, palpitations Respiratory:  No shortness of breath at rest or with exertion, wheezes GastrointestinaI: No nausea, vomiting, diarrhea, abdominal pain, fecal incontinence Genitourinary:  No dysuria, urinary  retention or frequency Musculoskeletal:  Diffuse pain Integumentary: No rash, pruritus, skin lesions Neurological: as above Psychiatric: No depression, insomnia, anxiety Endocrine: No palpitations, fatigue, diaphoresis, mood swings, change in appetite, change in weight, increased thirst Hematologic/Lymphatic:  No anemia, purpura, petechiae. Allergic/Immunologic: no itchy/runny eyes, nasal congestion, recent allergic reactions, rashes  PHYSICAL EXAM: Filed Vitals:   07/05/13 0955  BP: 128/70  Pulse: 68  Resp: 18   General: No acute distress Head:  Normocephalic/atraumatic Neck: supple, no paraspinal tenderness, full range of motion Heart:  Regular rate and rhythm Lungs:  Clear to auscultation bilaterally Back: No paraspinal tenderness Neurological Exam: alert and oriented to person, place, and time. Attention span and concentration intact, recent and remote memory intact, fund of knowledge intact.  Speech fluent and not dysarthric, language intact.  CN II-XII intact. Fundoscopic exam unremarkable without vessel changes, exudates, hemorrhages or papilledema.  Bulk and tone normal, muscle strength 5/5 throughout.  Sensation to light touch, temperature and vibration intact.  Deep tendon reflexes 2+ throughout, toes downgoing.  Finger to nose and heel to shin testing intact.  Gait normal, Romberg negative.  IMPRESSION: 1.  History of demyelinating disease.  Symptomatically stable.  Given a diagnosis of multiple sclerosis in the past.  MRI reveals evidence for remote optic neuritis, which could explain the episodes of transient blindness.  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  ADEM is a possibility, however that would usually be an isolated event. 2.  Chronic pain syndrome.  PLAN: 1.  We will see if we can check for NMO antibodies.  Luckily, his condition has been stable for so many years.  However, NMO is  potentially a very aggressive disease, so having a definite diagnosis may be helpful because treatment is different than it is for  MS. 2.  Follow up as scheduled.  15 minutes spent with patient, 100% spent reviewing MRI, discussing differential diagnosis, counseling and coordinating care.  Metta Clines, DO  CC:  Jani Gravel, MD

## 2013-08-14 ENCOUNTER — Ambulatory Visit: Payer: Medicare Other | Admitting: Family Medicine

## 2013-08-23 ENCOUNTER — Ambulatory Visit (INDEPENDENT_AMBULATORY_CARE_PROVIDER_SITE_OTHER): Payer: Medicare Other | Admitting: Neurology

## 2013-08-23 ENCOUNTER — Encounter: Payer: Self-pay | Admitting: Neurology

## 2013-08-23 VITALS — BP 118/68 | HR 68 | Temp 97.5°F | Resp 16 | Ht 70.0 in | Wt 203.4 lb

## 2013-08-23 DIAGNOSIS — G35 Multiple sclerosis: Secondary | ICD-10-CM

## 2013-08-23 NOTE — Patient Instructions (Signed)
You look like you are doing well.  Follow up in one year or as needed.  Call with questions or concerns.

## 2013-08-23 NOTE — Progress Notes (Signed)
NEUROLOGY FOLLOW UP OFFICE NOTE  Dale Shields [id number]  HISTORY OF PRESENT ILLNESS: Dale Shields is a 63 year old right-handed man with history of multiple sclerosis, obesity, insomnia, fibromyalgia, COPD, sleep apnea, hypothyroidism, thrombocytopenia, endocarditis, hyperlipidemia, CAD with stents x3, neurogenic bladder, arthritis and history of renal failure who follows up for history of multiple sclerosis.  Records and images were personally reviewed where available.    UPDATE: Plan was to check NMO antibodies, given his history of recurrent optic neuritis and remote history of what clinically sounded like transverse myelitis.  His insurance would not cover the test however.  Overall, he is doing well.  PT is planning on letting him go, which he still feel he needs.  He does walk around but not as much as he could.  Moved to a new residence, [place].  HISTORY: He reports having transverse myelitis at age 4, causing temporary paralysis.  At age 79, he had transient blindness in both eyes.  At age 50, he had transient blindness in one eye.  4 years later, he had transient blindness again in both eyes.  He was symptom-free up until around 1990.  He had developed extreme fatigued.  He was evaluated at [facility], where he had an LP and was told that he had MS.  Over the years, he had been on Avonex, Betaseron and Copaxone at some point.  He has not been on any disease-modifying agents for several years.  He was last evaluated by a neurologist in [facility], who told him that he probably doesn't need any treatment at this point.  He really hasn't had any flare ups.  Two years ago, he had problems with his balance but was found to have a UTI.  The other day, he developed blurred vision in his right eye, which he thinks may have been related to stress.  He also has history of chronic pain.  He has been on several pain medications, including controlled substances.  He is  currently not followed by a pain specialist and is waiting for an appointment.  His PCP does not prescribe narcotics.  Went to the ED on 05/18/13 for blurred vision in right eye, similar to prior exacerbations.  No imaging was performed and no steroids were initiated.  His main concern is his uncontrolled chronic pain.  He presented to the ED the following day for the pain.  He reports that he was discharged from the pain clinic due to disagreements with the physician.  He does walk but often uses a motorized wheelchair because he has difficulty ambulating due to a bad right knee.  10/25/05 MRI BRAIN W/WO to evaluate for a presumed MS exacerbation was normal. 04/12/13 TSH 2.58 08/26/09 ANA negative, ESR 53 08/23/09 B12 408 MRI with and without contrast of the neuro-axis (brain, cervical, thoracic) was performed on 06/24/13.  There was no evidence of acute or chronic demyelinating plaques.  However, there is some mild atrophy of both chiasmatic and pre-chiasmatic optic nerve atrophy.  There is mild thinning of the corpus callosum stable compared to prior imaging from 2007.  Very minimal T2 and FLAIR hyperintensities are seen around the lateral ventricles, but are not impressive and not characteristic for a demyelinating disease.  PAST MEDICAL HISTORY: Past Medical History  Diagnosis Date  . Fibromyalgia   . Multiple sclerosis     with optic neuritis  . Narcolepsy   . Scoliosis   . Hypothyroidism   . HLD (hyperlipidemia)   .  Anemia of chronic disease   . Thrombocytopenia   . Pulmonary embolism     "suspected; never confirmed"--? treated with coumadin  . COPD (chronic obstructive pulmonary disease)   . Insomnia   . Carpal tunnel syndrome   . GERD (gastroesophageal reflux disease)   . BPH (benign prostatic hypertrophy)   . History of gout   . Neurogenic bladder     chronic indwelling foley catheter-changed monthly  . Optic neuritis   . History of epistaxis   . History of MRSA infection 2012     UTI, bacteremia, and endocarditis  . Endocarditis 2012  . Supraspinatus tendon tear 2012  . Heart murmur   . Myocardial infarction     "according to stress test; that's news to me"  . Exertional dyspnea   . OSA (obstructive sleep apnea)     "tried CPAP; lost more sleep w/use"  . History of blood transfusion     "gave me too much coumadin and heparin once"  . H/O hiatal hernia   . Osteoarthritis of multiple joints     "everywhere"  . DDD (degenerative disc disease)     knees, hips, shoulders--Dr. Ronnie Derby [facility] prior to this)  . CAD (coronary artery disease) 11/2011    Stents x 3  . Anxiety and depression   . Steroid-induced psychosis   . History of renal failure 2011    "on dialysis 3 times over 2 years"    MEDICATIONS: Current Outpatient Prescriptions on File Prior to Visit  Medication Sig Dispense Refill  . acetaminophen (TYLENOL) 500 MG tablet Take 500 mg by mouth every 4 (four) hours as needed for mild pain, fever or headache.      . Armodafinil (NUVIGIL) 250 MG tablet Take 250 mg by mouth daily.      Marland Kitchen aspirin 81 MG chewable tablet Chew 81 mg by mouth every evening.       . busPIRone (BUSPAR) 15 MG tablet Take 15 mg by mouth 2 (two) times daily.       . calcium acetate (PHOSLO) 667 MG capsule Take 667 mg by mouth daily as needed (for indigestion).      . carvedilol (COREG) 6.25 MG tablet Take 6.25 mg by mouth 2 (two) times daily.       . clonazePAM (KLONOPIN) 0.5 MG tablet Take 1 tablet (0.5 mg total) by mouth 2 (two) times daily as needed for anxiety.  60 tablet  0  . cloNIDine (CATAPRES - DOSED IN MG/24 HR) 0.1 mg/24hr patch Place 0.1 mg onto the skin once a week. On wednesdays      . DULoxetine (CYMBALTA) 60 MG capsule Take 1 capsule (60 mg total) by mouth 2 (two) times daily.  60 capsule  1  . Fluticasone-Salmeterol (ADVAIR) 250-50 MCG/DOSE AEPB Inhale 1 puff into the lungs 2 (two) times daily.      Marland Kitchen guaifenesin (ROBITUSSIN) 100 MG/5ML syrup Take 200 mg by mouth every 6  (six) hours as needed for cough.      . loperamide (IMODIUM) 2 MG capsule Take 2 mg by mouth as needed for diarrhea or loose stools.       . magnesium hydroxide (MILK OF MAGNESIA) 400 MG/5ML suspension Take 30 mLs by mouth at bedtime as needed for mild constipation.      . Multiple Vitamins-Minerals (THEREMS-M) TABS Take 1 tablet by mouth daily.      . nitroGLYCERIN (NITROSTAT) 0.3 MG SL tablet Place 0.4 mg under the tongue every 5 (five) minutes  as needed for chest pain.      . vitamin C (ASCORBIC ACID) 500 MG tablet Take 1,000 mg by mouth 2 (two) times daily.        No current facility-administered medications on file prior to visit.    ALLERGIES: Allergies  Allergen Reactions  . Ultram [Tramadol Hcl] Other (See Comments)    Seizures; "grand mal; twice"    FAMILY HISTORY: Family History  Problem Relation Age of Onset  . Alzheimer's disease Mother   . Colon cancer Neg Hx     SOCIAL HISTORY: History   Social History  . Marital Status: Single    Spouse Name: N/A    Number of Children: N/A  . Years of Education: N/A   Occupational History  . Not on file.   Social History Main Topics  . Smoking status: Current Every Day Smoker -- 0.50 packs/day for 40 years    Types: Cigarettes  . Smokeless tobacco: Never Used     Comment: Counseling sheet to quit smoking given in exam room   . Alcohol Use: No     Comment: 11/22/2011 "like crazy til 1988; nothing since"  . Drug Use: No     Comment: *8//13/13 "1986 til 1988 like crazy; nothing since"  . Sexual Activity: Not Currently   Other Topics Concern  . Not on file   Social History Narrative   Never married, no children.   Orig from [place], [place], lived a long time in [place].   Has been in [place] since about 2004.   College at [employer] "for years".   Denies alcohol or drug use.   Cigarettes: 50 pack-yr hx--current as of 03/2013.    REVIEW OF SYSTEMS: Constitutional: No fevers, chills, or sweats, no generalized fatigue,  change in appetite Eyes: No visual changes, double vision, eye pain Ear, nose and throat: No hearing loss, ear pain, nasal congestion, sore throat Cardiovascular: No chest pain, palpitations Respiratory:  No shortness of breath at rest or with exertion, wheezes GastrointestinaI: No nausea, vomiting, diarrhea, abdominal pain, fecal incontinence Genitourinary:  No dysuria, urinary retention or frequency Musculoskeletal:  No neck pain, back pain Integumentary: No rash, pruritus, skin lesions Neurological: as above Psychiatric: No depression, insomnia, anxiety Endocrine: No palpitations, fatigue, diaphoresis, mood swings, change in appetite, change in weight, increased thirst Hematologic/Lymphatic:  No anemia, purpura, petechiae. Allergic/Immunologic: no itchy/runny eyes, nasal congestion, recent allergic reactions, rashes  PHYSICAL EXAM: Filed Vitals:   08/23/13 1029  BP: 118/68  Pulse: 68  Temp: 97.5 F (36.4 C)  Resp: 16   General: No acute distress Head:  Normocephalic/atraumatic Neck: supple, no paraspinal tenderness, full range of motion Heart:  Regular rate and rhythm Lungs:  Clear to auscultation bilaterally Back: No paraspinal tenderness Neurological Exam: alert and oriented to person, place, and time. Attention span and concentration intact, recent and remote memory intact, fund of knowledge intact.  Speech fluent and not dysarthric, language intact.  CN II-XII intact. Fundoscopic exam unremarkable without vessel changes, exudates, hemorrhages or papilledema.  Bulk and tone normal, muscle strength 5/5 throughout.  Sensation to pinprick and vibration intact.  Deep tendon reflexes 2+ throughout, toes downgoing.  Finger to nose intact.  Gait with mildly flexed posture and reduced stride and arm swing, right foot abducted.  IMPRESSION: 1.  History of demyelinating disease.  Symptomatically stable.  Given a diagnosis of multiple sclerosis in the past.  MRI reveals evidence for  remote optic neuritis, which could explain the episodes of transient blindness.  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  Marland Kitchen  ADEM is a possibility, however that would usually be an isolated event.  Regardless, the disease seems inactive, so I wouldn't push getting the NMO antibodies. 2.  Chronic pain syndrome.  PLAN: Follow up in one year or as needed.  25 minutes spent with patient, over 50% spent counseling and coordinating care.  Metta Clines, DO  CC:  Jani Gravel, MD

## 2014-03-20 ENCOUNTER — Encounter (HOSPITAL_COMMUNITY): Payer: Self-pay | Admitting: Cardiology

## 2014-05-16 ENCOUNTER — Telehealth: Payer: Self-pay | Admitting: Neurology

## 2014-05-16 NOTE — Telephone Encounter (Signed)
IPt is returning your call  I gave pateint the name  for  [facility]  Ophthalmologist  Address: [address] [place], [place] [address]  [phone]  Hours: Open today  8:30AM-5PM

## 2014-05-16 NOTE — Telephone Encounter (Signed)
Pt called and states that he is feels like a he is losing his vision and would like to talk to someone please [phone] I spoke with patient and ask when he has had last eye exam ? He said about a year ago needed glasses but has not gotten them yet . He ask could he call me back he was on the other line

## 2014-05-16 NOTE — Telephone Encounter (Signed)
Pt called and states that he is feels like a he is losing his vision and would like to talk to someone please [phone]

## 2014-05-16 NOTE — Telephone Encounter (Signed)
Pt is returning your call

## 2014-06-21 ENCOUNTER — Emergency Department (HOSPITAL_COMMUNITY)
Admission: EM | Admit: 2014-06-21 | Discharge: 2014-06-21 | Disposition: A | Discharge status: Home / Self Care | Payer: Medicare Other | Attending: Emergency Medicine | Admitting: Emergency Medicine

## 2014-06-21 ENCOUNTER — Encounter (HOSPITAL_COMMUNITY): Payer: Self-pay | Admitting: Emergency Medicine

## 2014-06-21 DIAGNOSIS — M109 Gout, unspecified: Secondary | ICD-10-CM | POA: Diagnosis not present

## 2014-06-21 DIAGNOSIS — Z7951 Long term (current) use of inhaled steroids: Secondary | ICD-10-CM | POA: Insufficient documentation

## 2014-06-21 DIAGNOSIS — Z7982 Long term (current) use of aspirin: Secondary | ICD-10-CM | POA: Diagnosis not present

## 2014-06-21 DIAGNOSIS — I252 Old myocardial infarction: Secondary | ICD-10-CM | POA: Diagnosis not present

## 2014-06-21 DIAGNOSIS — I2510 Atherosclerotic heart disease of native coronary artery without angina pectoris: Secondary | ICD-10-CM | POA: Diagnosis not present

## 2014-06-21 DIAGNOSIS — M1990 Unspecified osteoarthritis, unspecified site: Secondary | ICD-10-CM | POA: Diagnosis not present

## 2014-06-21 DIAGNOSIS — F329 Major depressive disorder, single episode, unspecified: Secondary | ICD-10-CM | POA: Diagnosis not present

## 2014-06-21 DIAGNOSIS — Z87440 Personal history of urinary (tract) infections: Secondary | ICD-10-CM | POA: Insufficient documentation

## 2014-06-21 DIAGNOSIS — Z9889 Other specified postprocedural states: Secondary | ICD-10-CM | POA: Diagnosis not present

## 2014-06-21 DIAGNOSIS — Z720 Tobacco use: Secondary | ICD-10-CM | POA: Diagnosis not present

## 2014-06-21 DIAGNOSIS — G35 Multiple sclerosis: Secondary | ICD-10-CM | POA: Diagnosis not present

## 2014-06-21 DIAGNOSIS — R011 Cardiac murmur, unspecified: Secondary | ICD-10-CM | POA: Diagnosis not present

## 2014-06-21 DIAGNOSIS — Z8614 Personal history of Methicillin resistant Staphylococcus aureus infection: Secondary | ICD-10-CM | POA: Diagnosis not present

## 2014-06-21 DIAGNOSIS — J449 Chronic obstructive pulmonary disease, unspecified: Secondary | ICD-10-CM | POA: Insufficient documentation

## 2014-06-21 DIAGNOSIS — Z9861 Coronary angioplasty status: Secondary | ICD-10-CM | POA: Insufficient documentation

## 2014-06-21 DIAGNOSIS — N342 Other urethritis: Secondary | ICD-10-CM | POA: Diagnosis not present

## 2014-06-21 DIAGNOSIS — Z86711 Personal history of pulmonary embolism: Secondary | ICD-10-CM | POA: Insufficient documentation

## 2014-06-21 DIAGNOSIS — R3919 Other difficulties with micturition: Secondary | ICD-10-CM | POA: Diagnosis not present

## 2014-06-21 DIAGNOSIS — N400 Enlarged prostate without lower urinary tract symptoms: Secondary | ICD-10-CM | POA: Diagnosis not present

## 2014-06-21 DIAGNOSIS — F419 Anxiety disorder, unspecified: Secondary | ICD-10-CM | POA: Diagnosis not present

## 2014-06-21 DIAGNOSIS — M797 Fibromyalgia: Secondary | ICD-10-CM | POA: Insufficient documentation

## 2014-06-21 DIAGNOSIS — G4700 Insomnia, unspecified: Secondary | ICD-10-CM | POA: Insufficient documentation

## 2014-06-21 DIAGNOSIS — K219 Gastro-esophageal reflux disease without esophagitis: Secondary | ICD-10-CM | POA: Diagnosis not present

## 2014-06-21 DIAGNOSIS — G47419 Narcolepsy without cataplexy: Secondary | ICD-10-CM | POA: Insufficient documentation

## 2014-06-21 DIAGNOSIS — N4889 Other specified disorders of penis: Secondary | ICD-10-CM | POA: Diagnosis present

## 2014-06-21 DIAGNOSIS — D649 Anemia, unspecified: Secondary | ICD-10-CM | POA: Insufficient documentation

## 2014-06-21 DIAGNOSIS — Z79899 Other long term (current) drug therapy: Secondary | ICD-10-CM | POA: Diagnosis not present

## 2014-06-21 DIAGNOSIS — M419 Scoliosis, unspecified: Secondary | ICD-10-CM | POA: Insufficient documentation

## 2014-06-21 LAB — URINALYSIS, ROUTINE W REFLEX MICROSCOPIC
BILIRUBIN URINE: NEGATIVE
GLUCOSE, UA: NEGATIVE mg/dL
Ketones, ur: NEGATIVE mg/dL
NITRITE: POSITIVE — AB
Protein, ur: NEGATIVE mg/dL
Specific Gravity, Urine: <1.005 — ABNORMAL LOW (ref 1.005–1.030)
Urobilinogen, UA: 0.2 mg/dL (ref 0.0–1.0)
pH: 5.5 (ref 5.0–8.0)

## 2014-06-21 LAB — URINE MICROSCOPIC-ADD ON

## 2014-06-21 MED ORDER — LIDOCAINE HCL (PF) 1 % IJ SOLN
INTRAMUSCULAR | Status: AC
  Administered 2014-06-21: 5 mL
  Filled 2014-06-21: qty 5

## 2014-06-21 MED ORDER — CEFTRIAXONE SODIUM 250 MG IJ SOLR
250.0000 mg | Freq: Once | INTRAMUSCULAR | Status: AC
  Administered 2014-06-21: 250 mg via INTRAMUSCULAR
  Filled 2014-06-21: qty 250

## 2014-06-21 MED ORDER — LIDOCAINE HCL 2 % EX GEL
CUTANEOUS | Status: AC
  Filled 2014-06-21: qty 10

## 2014-06-21 MED ORDER — AZITHROMYCIN 1 G PO PACK
1.0000 g | PACK | Freq: Once | ORAL | Status: AC
  Administered 2014-06-21: 1 g via ORAL
  Filled 2014-06-21: qty 1

## 2014-06-21 NOTE — Discharge Instructions (Signed)
Urethritis Call Dr. Gaynelle Arabian to make appointment is still having discomfort by next week. Get your blood pressure recheck within the next 3 weeks. Today's was elevated at 148/98. Urethritis is an inflammation of the tube through which urine exits your bladder (urethra).  CAUSES Urethritis is often caused by an infection in your urethra. The infection can be viral, like herpes. The infection can also be bacterial, like gonorrhea. RISK FACTORS Risk factors of urethritis include:  Having sex without using a condom.  Having multiple sexual partners.  Having poor hygiene. SIGNS AND SYMPTOMS Symptoms of urethritis are less noticeable in women than in men. These symptoms include:  Burning feeling when you urinate (dysuria).  Discharge from your urethra.  Blood in your urine (hematuria).  Urinating more than usual. DIAGNOSIS  To confirm a diagnosis of urethritis, your health care provider will do the following:  Ask about your sexual history.  Perform a physical exam.  Have you provide a sample of your urine for lab testing.  Use a cotton swab to gently collect a sample from your urethra for lab testing. TREATMENT  It is important to treat urethritis. Depending on the cause, untreated urethritis may lead to serious genital infections and possibly infertility. Urethritis caused by a bacterial infection is treated with antibiotic medicine. All sexual partners must be treated.  HOME CARE INSTRUCTIONS  Do not have sex until the test results are known and treatment is completed, even if your symptoms go away before you finish treatment.  If you were prescribed an antibiotic, finish it all even if you start to feel better. SEEK MEDICAL CARE IF:   Your symptoms are not improved in 3 days.  Your symptoms are getting worse.  You develop abdominal pain or pelvic pain (in women).  You develop joint pain.  You have a fever. SEEK IMMEDIATE MEDICAL CARE IF:   You have severe pain in  the belly, back, or side.  You have repeated vomiting. MAKE SURE YOU:  Understand these instructions.  Will watch your condition.  Will get help right away if you are not doing well or get worse. Document Released: 09/21/2000 Document Revised: 08/12/2013 Document Reviewed: 11/26/2012 [facility] Patient Information 2015 [place], [place]. This information is not intended to replace advice given to you by your health care provider. Make sure you discuss any questions you have with your health care provider.

## 2014-06-21 NOTE — ED Notes (Signed)
Discussed discharge instructions with pt. Pt verbalized understanding. Nad noted.

## 2014-06-21 NOTE — ED Notes (Signed)
Pt arrives to ED for penis pain related to a foley catheter. Pt states catheter has been in place for a while. 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

## 2014-06-21 NOTE — ED Provider Notes (Addendum)
CSN: [id number]     Arrival date & time 06/21/14  1723 History   First MD Initiated Contact with Patient 06/21/14 1739     Chief Complaint  Patient presents with  . Penis Pain     (Consider location/radiation/quality/duration/timing/severity/associated sxs/prior Treatment) Patient is a 64 y.o. male presenting with penile pain.  Penis Pain   Complaint of burning sensation at penis onset is morning. No dysuria. Patient has chronic indwelling Foley catheter. Noted slight amount of discharge from his penis this morning No treatment prior to coming here. No other associated symptoms. Brought by EMS. Nothing makes symptoms better or worse. Patient reports he's occasionally sexually active. Last time one month ago. Past Medical History  Diagnosis Date  . Fibromyalgia   . Multiple sclerosis     with optic neuritis  . Narcolepsy   . Scoliosis   . Hypothyroidism   . HLD (hyperlipidemia)   . Anemia of chronic disease   . Thrombocytopenia   . Pulmonary embolism     "suspected; never confirmed"--? treated with coumadin  . COPD (chronic obstructive pulmonary disease)   . Insomnia   . Carpal tunnel syndrome   . GERD (gastroesophageal reflux disease)   . BPH (benign prostatic hypertrophy)   . History of gout   . Neurogenic bladder     chronic indwelling foley catheter-changed monthly  . Optic neuritis   . History of epistaxis   . History of MRSA infection 2012    UTI, bacteremia, and endocarditis  . Endocarditis 2012  . Supraspinatus tendon tear 2012  . Heart murmur   . Myocardial infarction     "according to stress test; that's news to me"  . Exertional dyspnea   . OSA (obstructive sleep apnea)     "tried CPAP; lost more sleep w/use"  . History of blood transfusion     "gave me too much coumadin and heparin once"  . H/O hiatal hernia   . Osteoarthritis of multiple joints     "everywhere"  . DDD (degenerative disc disease)     knees, hips, shoulders--Dr. Ronnie Derby [facility] prior to  this)  . CAD (coronary artery disease) 11/2011    Stents x 3  . Anxiety and depression   . Steroid-induced psychosis   . 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 Carpal tunnel release  ~ 2010    left with radius fracture repair  . Nasal cautery    . Skin graft  04/2009    right thigh to right "wrist all the way to my shoulder; necrotizing fasciitis  . Colonoscopy  2000; 2005; 03/15/2011    diminutve adenoma--recall 2017  . Coronary angioplasty  11/22/2011    3 vessels  . Coronary angioplasty with stent placement  11/22/2011    DES to LAD  . Knee arthroscopy w/ meniscal repair  1990's    right  . Left heart catheterization with coronary angiogram N/A 11/22/2011    Procedure: LEFT HEART CATHETERIZATION WITH CORONARY ANGIOGRAM;  Surgeon: Laverda Page, MD;  Location: [facility] CATH LAB;  Service: Cardiovascular;  Laterality: N/A;  . Percutaneous coronary stent intervention (pci-s)  11/22/2011    Procedure: PERCUTANEOUS CORONARY STENT INTERVENTION (PCI-S);  Surgeon: Laverda Page, MD;  Location: [facility] CATH LAB;  Service: Cardiovascular;;   Family History  Problem Relation Age of Onset  . Alzheimer's disease Mother   .  Colon cancer Neg Hx    History  Substance Use Topics  . Smoking status: Current Every Day Smoker -- 0.50 packs/day for 40 years    Types: Cigarettes  . Smokeless tobacco: Never Used     Comment: Counseling sheet to quit smoking given in exam room   . Alcohol Use: No     Comment: 11/22/2011 "like crazy til 1988; nothing since"    Review of Systems  Constitutional: Negative.   HENT: Negative.   Respiratory: Negative.   Cardiovascular: Negative.   Gastrointestinal: Negative.   Genitourinary: Positive for discharge, difficulty urinating and penile pain.       Chronic indwelling Foley catheter due to neurogenic bladder  Musculoskeletal:  Negative.   Skin: Negative.   Neurological: Negative.   Psychiatric/Behavioral: Negative.   All other systems reviewed and are negative.     Allergies  Ultram  Home Medications   Prior to Admission medications   Medication Sig Start Date End Date Taking? Authorizing Provider  acetaminophen (TYLENOL) 500 MG tablet Take 500 mg by mouth every 4 (four) hours as needed for mild pain, fever or headache.    Historical Provider, MD  Armodafinil (NUVIGIL) 250 MG tablet Take 250 mg by mouth daily.    Historical Provider, MD  aspirin 81 MG chewable tablet Chew 81 mg by mouth every evening.     Historical Provider, MD  busPIRone (BUSPAR) 15 MG tablet Take 15 mg by mouth 2 (two) times daily.  03/27/13   Historical Provider, MD  calcium acetate (PHOSLO) 667 MG capsule Take 667 mg by mouth daily as needed (for indigestion).    Historical Provider, MD  carvedilol (COREG) 6.25 MG tablet Take 6.25 mg by mouth 2 (two) times daily.     Historical Provider, MD  clonazePAM (KLONOPIN) 0.5 MG tablet Take 1 tablet (0.5 mg total) by mouth 2 (two) times daily as needed for anxiety. 06/05/13   Tammi Sou, MD  cloNIDine (CATAPRES - DOSED IN MG/24 HR) 0.1 mg/24hr patch Place 0.1 mg onto the skin once a week. On wednesdays 05/08/13   Historical Provider, MD  DULoxetine (CYMBALTA) 60 MG capsule Take 1 capsule (60 mg total) by mouth 2 (two) times daily. 06/05/13   Tammi Sou, MD  fentaNYL (DURAGESIC - DOSED MCG/HR) 100 MCG/HR Place 100 mcg onto the skin every 3 (three) days. 247mcy q 48 hours    Historical Provider, MD  Fluticasone-Salmeterol (ADVAIR) 250-50 MCG/DOSE AEPB Inhale 1 puff into the lungs 2 (two) times daily.    Historical Provider, MD  guaifenesin (ROBITUSSIN) 100 MG/5ML syrup Take 200 mg by mouth every 6 (six) hours as needed for cough.    Historical Provider, MD  loperamide (IMODIUM) 2 MG capsule Take 2 mg by mouth as needed for diarrhea or loose stools.     Historical Provider, MD  magnesium  hydroxide (MILK OF MAGNESIA) 400 MG/5ML suspension Take 30 mLs by mouth at bedtime as needed for mild constipation.    Historical Provider, MD  Multiple Vitamins-Minerals (THEREMS-M) TABS Take 1 tablet by mouth daily.    Historical Provider, MD  nitroGLYCERIN (NITROSTAT) 0.3 MG SL tablet Place 0.4 mg under the tongue every 5 (five) minutes as needed for chest pain.    Historical Provider, MD  vitamin C (ASCORBIC ACID) 500 MG tablet Take 1,000 mg by mouth 2 (two) times daily.     Historical Provider, MD   BP 151/109 mmHg  Pulse 109  Temp(Src) 98.7 F (37.1 C) (Oral)  Resp 20  Ht 6' (1.829 m)  Wt 152 lb (68.947 kg)  BMI 20.61 kg/m2  SpO2 95% Physical Exam  Constitutional: He appears well-developed and well-nourished. No distress.  HENT:  Head: Normocephalic and atraumatic.  Eyes: Conjunctivae are normal. Pupils are equal, round, and reactive to light.  Neck: Neck supple. No tracheal deviation present. No thyromegaly present.  Cardiovascular: Normal rate and regular rhythm.   No murmur heard. Pulmonary/Chest: Effort normal and breath sounds normal.  Abdominal: Soft. Bowel sounds are normal. He exhibits no distension. There is no tenderness.  Genitourinary: Penis normal.  Circumcised and no discharge  Musculoskeletal: Normal range of motion. He exhibits no edema or tenderness.  Neurological: He is alert. Coordination normal.  Skin: Skin is warm and dry. No rash noted.  Psychiatric: He has a normal mood and affect.  Nursing note and vitals reviewed.   ED Course  Procedures (including critical care time) Labs Review Labs Reviewed - No data to display  Imaging Review No results found.   EKG Interpretation None     16 French Foley catheter inserted by nurse with 10 mL balloon. Without difficulty. Results for orders placed or performed during the hospital encounter of 06/21/14  Urinalysis, Routine w reflex microscopic  Result Value Ref Range   Color, Urine YELLOW YELLOW    APPearance CLEAR CLEAR   Specific Gravity, Urine <1.005 (L) 1.005 - 1.030   pH 5.5 5.0 - 8.0   Glucose, UA NEGATIVE NEGATIVE mg/dL   Hgb urine dipstick TRACE (A) NEGATIVE   Bilirubin Urine NEGATIVE NEGATIVE   Ketones, ur NEGATIVE NEGATIVE mg/dL   Protein, ur NEGATIVE NEGATIVE mg/dL   Urobilinogen, UA 0.2 0.0 - 1.0 mg/dL   Nitrite POSITIVE (A) NEGATIVE   Leukocytes, UA MODERATE (A) NEGATIVE  Urine microscopic-add on  Result Value Ref Range   Squamous Epithelial / LPF RARE RARE   WBC, UA 11-20 <3 WBC/hpf   RBC / HPF 0-2 <3 RBC/hpf   Bacteria, UA FEW (A) RARE   No results found.  MDM  Isn't treated for urethritis with Rocephin, Zithromax. Urine sent for culture. Plan follow-up Dr. Gaynelle Arabian if not feeling better by next week your blood pressure recheck 3 weeks Final diagnoses:  None   diagnosis#1 urethritis #2 elevated blood pressure      Orlie Dakin, MD 06/21/14 1934  Orlie Dakin, MD 06/22/14 0126

## 2014-06-21 NOTE — ED Notes (Signed)
Indwelling foley cathter removed. No discharge noted around meatus, No visible blood in urine.

## 2014-06-23 LAB — URINE CULTURE: Special Requests: NORMAL

## 2014-07-07 ENCOUNTER — Ambulatory Visit: Payer: Medicare Other | Admitting: Neurology

## 2014-07-10 ENCOUNTER — Ambulatory Visit: Payer: Medicare Other | Admitting: Neurology

## 2014-07-22 ENCOUNTER — Ambulatory Visit: Payer: Medicare Other | Admitting: Neurology

## 2014-07-25 ENCOUNTER — Telehealth: Payer: Self-pay | Admitting: Neurology

## 2014-07-25 NOTE — Telephone Encounter (Signed)
Pt called wanting to speak to a nurse regarding some questions and concerns he has regarding his current condition C/b [phone]

## 2014-07-25 NOTE — Telephone Encounter (Signed)
Patient states Dr. Katherine Roan wanted clarification on patient's dx of MS. He stated that his office was going to be sending over a records request. I told him that I would forward his last ov note to Dr. Katherine Roan & if he needed more information after receiving note that he would most likely send a request for more records.

## 2014-07-29 ENCOUNTER — Ambulatory Visit: Payer: Medicare Other | Admitting: Neurology

## 2014-11-15 ENCOUNTER — Encounter (HOSPITAL_COMMUNITY): Payer: Self-pay | Admitting: *Deleted

## 2014-11-15 ENCOUNTER — Inpatient Hospital Stay (HOSPITAL_COMMUNITY)
Admission: EM | Admit: 2014-11-15 | Discharge: 2014-11-19 | DRG: 698 | Disposition: A | Discharge status: Home / Self Care | Payer: Medicare Other | Attending: Internal Medicine | Admitting: Internal Medicine

## 2014-11-15 ENCOUNTER — Emergency Department (HOSPITAL_COMMUNITY): Payer: Medicare Other

## 2014-11-15 DIAGNOSIS — D638 Anemia in other chronic diseases classified elsewhere: Secondary | ICD-10-CM | POA: Diagnosis present

## 2014-11-15 DIAGNOSIS — Z955 Presence of coronary angioplasty implant and graft: Secondary | ICD-10-CM | POA: Diagnosis not present

## 2014-11-15 DIAGNOSIS — R627 Adult failure to thrive: Secondary | ICD-10-CM | POA: Diagnosis present

## 2014-11-15 DIAGNOSIS — F329 Major depressive disorder, single episode, unspecified: Secondary | ICD-10-CM | POA: Diagnosis present

## 2014-11-15 DIAGNOSIS — G35 Multiple sclerosis: Secondary | ICD-10-CM | POA: Diagnosis present

## 2014-11-15 DIAGNOSIS — Z7982 Long term (current) use of aspirin: Secondary | ICD-10-CM | POA: Diagnosis not present

## 2014-11-15 DIAGNOSIS — G8929 Other chronic pain: Secondary | ICD-10-CM | POA: Diagnosis present

## 2014-11-15 DIAGNOSIS — N132 Hydronephrosis with renal and ureteral calculous obstruction: Secondary | ICD-10-CM | POA: Diagnosis not present

## 2014-11-15 DIAGNOSIS — E440 Moderate protein-calorie malnutrition: Secondary | ICD-10-CM | POA: Diagnosis present

## 2014-11-15 DIAGNOSIS — E785 Hyperlipidemia, unspecified: Secondary | ICD-10-CM | POA: Diagnosis present

## 2014-11-15 DIAGNOSIS — B964 Proteus (mirabilis) (morganii) as the cause of diseases classified elsewhere: Secondary | ICD-10-CM | POA: Diagnosis present

## 2014-11-15 DIAGNOSIS — I2510 Atherosclerotic heart disease of native coronary artery without angina pectoris: Secondary | ICD-10-CM | POA: Diagnosis present

## 2014-11-15 DIAGNOSIS — M159 Polyosteoarthritis, unspecified: Secondary | ICD-10-CM | POA: Diagnosis present

## 2014-11-15 DIAGNOSIS — Y846 Urinary catheterization as the cause of abnormal reaction of the patient, or of later complication, without mention of misadventure at the time of the procedure: Secondary | ICD-10-CM | POA: Diagnosis present

## 2014-11-15 DIAGNOSIS — N1330 Unspecified hydronephrosis: Secondary | ICD-10-CM | POA: Diagnosis present

## 2014-11-15 DIAGNOSIS — G9340 Encephalopathy, unspecified: Secondary | ICD-10-CM | POA: Diagnosis present

## 2014-11-15 DIAGNOSIS — Z7951 Long term (current) use of inhaled steroids: Secondary | ICD-10-CM | POA: Diagnosis not present

## 2014-11-15 DIAGNOSIS — M797 Fibromyalgia: Secondary | ICD-10-CM | POA: Diagnosis present

## 2014-11-15 DIAGNOSIS — G47419 Narcolepsy without cataplexy: Secondary | ICD-10-CM | POA: Diagnosis present

## 2014-11-15 DIAGNOSIS — Z8614 Personal history of Methicillin resistant Staphylococcus aureus infection: Secondary | ICD-10-CM

## 2014-11-15 DIAGNOSIS — I951 Orthostatic hypotension: Secondary | ICD-10-CM | POA: Diagnosis present

## 2014-11-15 DIAGNOSIS — T8351XA Infection and inflammatory reaction due to indwelling urinary catheter, initial encounter: Principal | ICD-10-CM | POA: Diagnosis present

## 2014-11-15 DIAGNOSIS — N3091 Cystitis, unspecified with hematuria: Secondary | ICD-10-CM | POA: Diagnosis present

## 2014-11-15 DIAGNOSIS — N289 Disorder of kidney and ureter, unspecified: Secondary | ICD-10-CM | POA: Diagnosis not present

## 2014-11-15 DIAGNOSIS — Z79899 Other long term (current) drug therapy: Secondary | ICD-10-CM | POA: Diagnosis not present

## 2014-11-15 DIAGNOSIS — G4733 Obstructive sleep apnea (adult) (pediatric): Secondary | ICD-10-CM | POA: Diagnosis present

## 2014-11-15 DIAGNOSIS — M109 Gout, unspecified: Secondary | ICD-10-CM | POA: Diagnosis present

## 2014-11-15 DIAGNOSIS — K219 Gastro-esophageal reflux disease without esophagitis: Secondary | ICD-10-CM | POA: Diagnosis present

## 2014-11-15 DIAGNOSIS — E039 Hypothyroidism, unspecified: Secondary | ICD-10-CM | POA: Diagnosis present

## 2014-11-15 DIAGNOSIS — R319 Hematuria, unspecified: Secondary | ICD-10-CM | POA: Diagnosis present

## 2014-11-15 DIAGNOSIS — N179 Acute kidney failure, unspecified: Secondary | ICD-10-CM | POA: Diagnosis present

## 2014-11-15 DIAGNOSIS — M419 Scoliosis, unspecified: Secondary | ICD-10-CM | POA: Diagnosis present

## 2014-11-15 DIAGNOSIS — N400 Enlarged prostate without lower urinary tract symptoms: Secondary | ICD-10-CM | POA: Diagnosis present

## 2014-11-15 DIAGNOSIS — N390 Urinary tract infection, site not specified: Secondary | ICD-10-CM | POA: Diagnosis present

## 2014-11-15 DIAGNOSIS — N319 Neuromuscular dysfunction of bladder, unspecified: Secondary | ICD-10-CM | POA: Diagnosis present

## 2014-11-15 DIAGNOSIS — J449 Chronic obstructive pulmonary disease, unspecified: Secondary | ICD-10-CM | POA: Diagnosis present

## 2014-11-15 DIAGNOSIS — Z79891 Long term (current) use of opiate analgesic: Secondary | ICD-10-CM | POA: Diagnosis not present

## 2014-11-15 DIAGNOSIS — I252 Old myocardial infarction: Secondary | ICD-10-CM

## 2014-11-15 DIAGNOSIS — R310 Gross hematuria: Secondary | ICD-10-CM | POA: Diagnosis present

## 2014-11-15 DIAGNOSIS — R531 Weakness: Secondary | ICD-10-CM

## 2014-11-15 DIAGNOSIS — F419 Anxiety disorder, unspecified: Secondary | ICD-10-CM | POA: Diagnosis present

## 2014-11-15 DIAGNOSIS — N3001 Acute cystitis with hematuria: Secondary | ICD-10-CM | POA: Diagnosis not present

## 2014-11-15 DIAGNOSIS — T83511A Infection and inflammatory reaction due to indwelling urethral catheter, initial encounter: Secondary | ICD-10-CM

## 2014-11-15 HISTORY — DX: Unspecified hydronephrosis: N13.30

## 2014-11-15 LAB — URINE MICROSCOPIC-ADD ON

## 2014-11-15 LAB — CBC WITH DIFFERENTIAL/PLATELET
BASOS PCT: 0 % (ref 0–1)
Basophils Absolute: 0.1 10*3/uL (ref 0.0–0.1)
EOS ABS: 0.5 10*3/uL (ref 0.0–0.7)
EOS PCT: 5 % (ref 0–5)
HEMATOCRIT: 40.1 % (ref 39.0–52.0)
HEMOGLOBIN: 12.6 g/dL — AB (ref 13.0–17.0)
Lymphocytes Relative: 27 % (ref 12–46)
Lymphs Abs: 3.2 10*3/uL (ref 0.7–4.0)
MCH: 30.4 pg (ref 26.0–34.0)
MCHC: 31.4 g/dL (ref 30.0–36.0)
MCV: 96.6 fL (ref 78.0–100.0)
MONOS PCT: 10 % (ref 3–12)
Monocytes Absolute: 1.1 10*3/uL — ABNORMAL HIGH (ref 0.1–1.0)
Neutro Abs: 6.9 10*3/uL (ref 1.7–7.7)
Neutrophils Relative %: 58 % (ref 43–77)
PLATELETS: 443 10*3/uL — AB (ref 150–400)
RBC: 4.15 MIL/uL — AB (ref 4.22–5.81)
RDW: 13.4 % (ref 11.5–15.5)
WBC: 11.8 10*3/uL — ABNORMAL HIGH (ref 4.0–10.5)

## 2014-11-15 LAB — URINALYSIS, ROUTINE W REFLEX MICROSCOPIC
Glucose, UA: 100 mg/dL — AB
Nitrite: POSITIVE — AB
Specific Gravity, Urine: <1.005 — ABNORMAL LOW (ref 1.005–1.030)
Urobilinogen, UA: 2 mg/dL — ABNORMAL HIGH (ref 0.0–1.0)

## 2014-11-15 LAB — HEPATIC FUNCTION PANEL
ALBUMIN: 2.8 g/dL — AB (ref 3.5–5.0)
ALT: 49 U/L (ref 17–63)
AST: 33 U/L (ref 15–41)
Alkaline Phosphatase: 89 U/L (ref 38–126)
BILIRUBIN DIRECT: 0.1 mg/dL (ref 0.1–0.5)
BILIRUBIN INDIRECT: 0.3 mg/dL (ref 0.3–0.9)
Total Bilirubin: 0.4 mg/dL (ref 0.3–1.2)
Total Protein: 6.1 g/dL — ABNORMAL LOW (ref 6.5–8.1)

## 2014-11-15 LAB — BASIC METABOLIC PANEL
ANION GAP: 10 (ref 5–15)
BUN: 37 mg/dL — ABNORMAL HIGH (ref 6–20)
CO2: 29 mmol/L (ref 22–32)
Calcium: 8.5 mg/dL — ABNORMAL LOW (ref 8.9–10.3)
Chloride: 97 mmol/L — ABNORMAL LOW (ref 101–111)
Creatinine, Ser: 1.79 mg/dL — ABNORMAL HIGH (ref 0.61–1.24)
GFR calc Af Amer: 45 mL/min — ABNORMAL LOW (ref >60)
GFR, EST NON AFRICAN AMERICAN: 39 mL/min — AB (ref >60)
Glucose, Bld: 111 mg/dL — ABNORMAL HIGH (ref 65–99)
Potassium: 4 mmol/L (ref 3.5–5.1)
SODIUM: 136 mmol/L (ref 135–145)

## 2014-11-15 LAB — I-STAT CG4 LACTIC ACID, ED: Lactic Acid, Venous: 0.64 mmol/L (ref 0.5–2.0)

## 2014-11-15 MED ORDER — ONDANSETRON HCL 4 MG PO TABS: 4.0000 mg | ORAL_TABLET | Freq: Four times a day (QID) | ORAL | Status: DC | PRN

## 2014-11-15 MED ORDER — LEVALBUTEROL HCL 0.63 MG/3ML IN NEBU: 0.6300 mg | INHALATION_SOLUTION | Freq: Four times a day (QID) | RESPIRATORY_TRACT | Status: DC | PRN

## 2014-11-15 MED ORDER — SODIUM CHLORIDE 0.9 % IV SOLN
Freq: Once | INTRAVENOUS | Status: AC
  Administered 2014-11-15: 1000 mL via INTRAVENOUS

## 2014-11-15 MED ORDER — FENTANYL CITRATE (PF) 100 MCG/2ML IJ SOLN
100.0000 ug | Freq: Once | INTRAMUSCULAR | Status: AC
  Administered 2014-11-15: 100 ug via INTRAVENOUS
  Filled 2014-11-15: qty 2

## 2014-11-15 MED ORDER — FENTANYL 75 MCG/HR TD PT72
175.0000 ug | MEDICATED_PATCH | TRANSDERMAL | Status: DC
  Administered 2014-11-15: 175 ug via TRANSDERMAL
  Filled 2014-11-15 (×2): qty 1

## 2014-11-15 MED ORDER — ONDANSETRON HCL 4 MG/2ML IJ SOLN: 4.0000 mg | Freq: Four times a day (QID) | INTRAMUSCULAR | Status: DC | PRN

## 2014-11-15 MED ORDER — ASPIRIN 81 MG PO CHEW
81.0000 mg | CHEWABLE_TABLET | Freq: Every day | ORAL | Status: DC
  Administered 2014-11-16 – 2014-11-19 (×4): 81 mg via ORAL
  Filled 2014-11-15 (×4): qty 1

## 2014-11-15 MED ORDER — ALUM & MAG HYDROXIDE-SIMETH 200-200-20 MG/5ML PO SUSP: 30.0000 mL | Freq: Four times a day (QID) | ORAL | Status: DC | PRN

## 2014-11-15 MED ORDER — ACETAMINOPHEN 325 MG PO TABS
650.0000 mg | ORAL_TABLET | Freq: Four times a day (QID) | ORAL | Status: DC | PRN
Start: 2014-11-15 — End: 2014-11-19
  Administered 2014-11-16: 650 mg via ORAL
  Filled 2014-11-15: qty 2

## 2014-11-15 MED ORDER — CARVEDILOL 3.125 MG PO TABS
6.2500 mg | ORAL_TABLET | Freq: Two times a day (BID) | ORAL | Status: DC
  Administered 2014-11-15 – 2014-11-19 (×8): 6.25 mg via ORAL
  Filled 2014-11-15 (×8): qty 2

## 2014-11-15 MED ORDER — MOMETASONE FURO-FORMOTEROL FUM 100-5 MCG/ACT IN AERO
2.0000 | INHALATION_SPRAY | Freq: Two times a day (BID) | RESPIRATORY_TRACT | Status: DC
  Administered 2014-11-15 – 2014-11-19 (×8): 2 via RESPIRATORY_TRACT
  Filled 2014-11-15: qty 8.8

## 2014-11-15 MED ORDER — LOPERAMIDE HCL 2 MG PO CAPS: 2.0000 mg | ORAL_CAPSULE | ORAL | Status: DC | PRN

## 2014-11-15 MED ORDER — SODIUM CHLORIDE 0.9 % IV BOLUS (SEPSIS)
1000.0000 mL | Freq: Once | INTRAVENOUS | Status: AC
  Administered 2014-11-15: 1000 mL via INTRAVENOUS

## 2014-11-15 MED ORDER — DEXTROSE 5 % IV SOLN
1.0000 g | Freq: Once | INTRAVENOUS | Status: AC
  Administered 2014-11-15: 1 g via INTRAVENOUS
  Filled 2014-11-15: qty 10

## 2014-11-15 MED ORDER — CALCIUM ACETATE (PHOS BINDER) 667 MG PO CAPS: 667.0000 mg | ORAL_CAPSULE | Freq: Every day | ORAL | Status: DC | PRN

## 2014-11-15 MED ORDER — ATORVASTATIN CALCIUM 10 MG PO TABS
10.0000 mg | ORAL_TABLET | Freq: Every day | ORAL | Status: DC
  Administered 2014-11-16 – 2014-11-18 (×3): 10 mg via ORAL
  Filled 2014-11-15 (×3): qty 1

## 2014-11-15 MED ORDER — DEXTROSE 5 % IV SOLN
1.0000 g | INTRAVENOUS | Status: DC
  Administered 2014-11-16 – 2014-11-18 (×3): 1 g via INTRAVENOUS
  Filled 2014-11-15 (×4): qty 10

## 2014-11-15 MED ORDER — BUPROPION HCL ER (SR) 150 MG PO TB12
ORAL_TABLET | ORAL | Status: AC
  Filled 2014-11-15: qty 1

## 2014-11-15 MED ORDER — FENTANYL 75 MCG/HR TD PT72: 175.0000 ug | MEDICATED_PATCH | TRANSDERMAL | Status: DC

## 2014-11-15 MED ORDER — TICAGRELOR 90 MG PO TABS
ORAL_TABLET | ORAL | Status: AC
  Filled 2014-11-15: qty 1

## 2014-11-15 MED ORDER — CLONAZEPAM 0.5 MG PO TABS
0.5000 mg | ORAL_TABLET | Freq: Two times a day (BID) | ORAL | Status: DC | PRN
  Administered 2014-11-17 – 2014-11-18 (×2): 0.5 mg via ORAL
  Filled 2014-11-15 (×3): qty 1

## 2014-11-15 MED ORDER — ARMODAFINIL 250 MG PO TABS
250.0000 mg | ORAL_TABLET | Freq: Every morning | ORAL | Status: DC
  Filled 2014-11-15 (×2): qty 1

## 2014-11-15 MED ORDER — TICAGRELOR 90 MG PO TABS
90.0000 mg | ORAL_TABLET | Freq: Two times a day (BID) | ORAL | Status: DC
  Administered 2014-11-15 – 2014-11-19 (×8): 90 mg via ORAL
  Filled 2014-11-15 (×12): qty 1

## 2014-11-15 MED ORDER — ACETAMINOPHEN 650 MG RE SUPP
650.0000 mg | Freq: Four times a day (QID) | RECTAL | Status: DC | PRN
Start: 2014-11-15 — End: 2014-11-19

## 2014-11-15 MED ORDER — BUPROPION HCL 75 MG PO TABS
ORAL_TABLET | ORAL | Status: AC
  Filled 2014-11-15: qty 1

## 2014-11-15 MED ORDER — SENNOSIDES-DOCUSATE SODIUM 8.6-50 MG PO TABS: 1.0000 | ORAL_TABLET | Freq: Every evening | ORAL | Status: DC | PRN

## 2014-11-15 MED ORDER — DULOXETINE HCL 60 MG PO CPEP
60.0000 mg | ORAL_CAPSULE | Freq: Two times a day (BID) | ORAL | Status: DC
  Administered 2014-11-15 – 2014-11-19 (×8): 60 mg via ORAL
  Filled 2014-11-15 (×8): qty 1

## 2014-11-15 MED ORDER — LEVALBUTEROL TARTRATE 45 MCG/ACT IN AERO: 2.0000 | INHALATION_SPRAY | RESPIRATORY_TRACT | Status: DC | PRN

## 2014-11-15 MED ORDER — BUPROPION HCL ER (SR) 150 MG PO TB12
150.0000 mg | ORAL_TABLET | Freq: Two times a day (BID) | ORAL | Status: DC
  Administered 2014-11-15 – 2014-11-19 (×8): 150 mg via ORAL
  Filled 2014-11-15 (×12): qty 1

## 2014-11-15 MED ORDER — ENOXAPARIN SODIUM 40 MG/0.4ML ~~LOC~~ SOLN
40.0000 mg | SUBCUTANEOUS | Status: DC
Start: 2014-11-15 — End: 2014-11-16
  Administered 2014-11-15: 40 mg via SUBCUTANEOUS
  Filled 2014-11-15: qty 0.4

## 2014-11-15 MED ORDER — NITROGLYCERIN 0.4 MG SL SUBL: 0.4000 mg | SUBLINGUAL_TABLET | SUBLINGUAL | Status: DC | PRN

## 2014-11-15 MED ORDER — MOMETASONE FURO-FORMOTEROL FUM 100-5 MCG/ACT IN AERO
INHALATION_SPRAY | RESPIRATORY_TRACT | Status: AC
  Filled 2014-11-15: qty 8.8

## 2014-11-15 MED ORDER — SODIUM CHLORIDE 0.9 % IV SOLN
INTRAVENOUS | Status: DC
  Administered 2014-11-15 – 2014-11-16 (×3): via INTRAVENOUS

## 2014-11-15 NOTE — ED Provider Notes (Signed)
CSN: [id number]     Arrival date & time 11/15/14  1658 History   First MD Initiated Contact with Patient 11/15/14 1701     Chief Complaint  Patient presents with  . Hematuria  . Weakness     (Consider location/radiation/quality/duration/timing/severity/associated sxs/prior Treatment) Patient is a 64 y.o. male presenting with hematuria and weakness. The history is provided by the patient.  Hematuria This is a new problem. Pertinent negatives include no chest pain and no abdominal pain.  Weakness Pertinent negatives include no chest pain and no abdominal pain.   patient presents with generalized weakness and feeling bad. He lives at home and has a chronic Foley. States he just hasn't been feeling as well. States that has been going for about 2 weeks for the last 2 days his urine has looked darker. He states he's colorblind so did not see blood. No fevers. He was apparently more confused and had a syncopal episode earlier today. Patient states he does not remember this. His brother-in-law who lives by him witnessed. Initial found to be hypotensive with pressures in the 80s improved after  some IV fluid. No recent hospitalization.  Past Medical History  Diagnosis Date  . Fibromyalgia   . Multiple sclerosis     with optic neuritis  . Narcolepsy   . Scoliosis   . Hypothyroidism   . HLD (hyperlipidemia)   . Anemia of chronic disease   . Thrombocytopenia   . Pulmonary embolism     "suspected; never confirmed"--? treated with coumadin  . COPD (chronic obstructive pulmonary disease)   . Insomnia   . Carpal tunnel syndrome   . GERD (gastroesophageal reflux disease)   . BPH (benign prostatic hypertrophy)   . History of gout   . Neurogenic bladder     chronic indwelling foley catheter-changed monthly  . Optic neuritis   . History of epistaxis   . History of MRSA infection 2012    UTI, bacteremia, and endocarditis  . Endocarditis 2012  . Supraspinatus tendon tear 2012  . Heart murmur    . Myocardial infarction     "according to stress test; that's news to me"  . Exertional dyspnea   . OSA (obstructive sleep apnea)     "tried CPAP; lost more sleep w/use"  . History of blood transfusion     "gave me too much coumadin and heparin once"  . H/O hiatal hernia   . Osteoarthritis of multiple joints     "everywhere"  . DDD (degenerative disc disease)     knees, hips, shoulders--Dr. Ronnie Derby [facility] prior to this)  . CAD (coronary artery disease) 11/2011    Stents x 3  . Anxiety and depression   . Steroid-induced psychosis   . History of renal failure 2011    "on dialysis 3 times over 2 years"   Past Surgical History  Procedure Laterality Date  . Carpal tunnel release  ~ 2010    left with radius fracture repair  . Nasal cautery    . Skin graft  04/2009    right thigh to right "wrist all the way to my shoulder; necrotizing fasciitis  . Colonoscopy  2000; 2005; 03/15/2011    diminutve adenoma--recall 2017  . Coronary angioplasty  11/22/2011    3 vessels  . Coronary angioplasty with stent placement  11/22/2011    DES to LAD  . Knee arthroscopy w/ meniscal repair  1990's    right  . Left heart catheterization with coronary angiogram N/A 11/22/2011  Procedure: LEFT HEART CATHETERIZATION WITH CORONARY ANGIOGRAM;  Surgeon: Laverda Page, MD;  Location: [facility] CATH LAB;  Service: Cardiovascular;  Laterality: N/A;  . Percutaneous coronary stent intervention (pci-s)  11/22/2011    Procedure: PERCUTANEOUS CORONARY STENT INTERVENTION (PCI-S);  Surgeon: Laverda Page, MD;  Location: [facility] CATH LAB;  Service: Cardiovascular;;   Family History  Problem Relation Age of Onset  . Alzheimer's disease Mother   . Colon cancer Neg Hx    History  Substance Use Topics  . Smoking status: Former Smoker -- 0.00 packs/day for 40 years    Quit date: 08/15/2014  . Smokeless tobacco: Never Used     Comment: Counseling sheet to quit smoking given in exam room   . Alcohol Use: No     Comment:  11/22/2011 "like crazy til 1988; nothing since"    Review of Systems  Constitutional: Positive for appetite change. Negative for fever and diaphoresis.  Respiratory: Negative for cough and chest tightness.   Cardiovascular: Negative for chest pain.  Gastrointestinal: Negative for abdominal pain.  Genitourinary: Positive for hematuria.  Musculoskeletal: Negative for back pain.  Skin: Negative for wound.  Neurological: Positive for weakness.  Psychiatric/Behavioral: Negative for behavioral problems.      Allergies  Ultram  Home Medications   Prior to Admission medications   Medication Sig Start Date End Date Taking? Authorizing Provider  acetaminophen (TYLENOL) 500 MG tablet Take 500 mg by mouth every 4 (four) hours as needed for mild pain, fever or headache.   Yes Historical Provider, MD  albuterol (PROVENTIL) (2.5 MG/3ML) 0.083% nebulizer solution Take 2.5 mg by nebulization every 6 (six) hours as needed for wheezing or shortness of breath.   Yes Historical Provider, MD  ALPRAZolam (XANAX) 0.25 MG tablet Take 0.25 mg by mouth 2 (two) times daily as needed for anxiety.   Yes Historical Provider, MD  APPLE CIDER VINEGAR PO Take 1 tablet by mouth daily.   Yes Historical Provider, MD  Armodafinil (NUVIGIL) 250 MG tablet Take 250 mg by mouth every morning.    Yes Historical Provider, MD  aspirin 81 MG chewable tablet Chew 81 mg by mouth daily.    Yes Historical Provider, MD  atorvastatin (LIPITOR) 10 MG tablet Take 10 mg by mouth daily.   Yes Historical Provider, MD  b complex vitamins tablet Take 1 tablet by mouth daily.   Yes Historical Provider, MD  buPROPion (WELLBUTRIN SR) 150 MG 12 hr tablet Take 150 mg by mouth 2 (two) times daily.   Yes Historical Provider, MD  calcium acetate (PHOSLO) 667 MG capsule Take 667 mg by mouth daily as needed (for indigestion).   Yes Historical Provider, MD  carvedilol (COREG) 6.25 MG tablet Take 6.25 mg by mouth 2 (two) times daily.    Yes Historical  Provider, MD  Cholecalciferol (VITAMIN D PO) Take 1 tablet by mouth daily.   Yes Historical Provider, MD  clonazePAM (KLONOPIN) 0.5 MG tablet Take 0.5 mg by mouth every 12 (twelve) hours as needed for anxiety.   Yes Historical Provider, MD  DULoxetine (CYMBALTA) 60 MG capsule Take 1 capsule (60 mg total) by mouth 2 (two) times daily. 06/05/13  Yes Tammi Sou, MD  fentaNYL (DURAGESIC - DOSED MCG/HR) 100 MCG/HR Place 175 mcg onto the skin every other day. Applies 150mcg and 59mcg for a total of 166mcg every 72H   Yes Historical Provider, MD  fentaNYL (DURAGESIC - DOSED MCG/HR) 75 MCG/HR Place 175 mcg onto the skin every 3 (three)  days. Applies 196mcg and 5mcg for a total of 115mcg every 72H 10/25/14  Yes Historical Provider, MD  Fluticasone-Salmeterol (ADVAIR) 250-50 MCG/DOSE AEPB Inhale 1 puff into the lungs 2 (two) times daily.   Yes Historical Provider, MD  levalbuterol [facility] HFA) 45 MCG/ACT inhaler Inhale 2 puffs into the lungs every 4 (four) hours as needed for wheezing.   Yes Historical Provider, MD  loperamide (IMODIUM) 2 MG capsule Take 2 mg by mouth as needed for diarrhea or loose stools.    Yes Historical Provider, MD  Misc Natural Products (RED WINE COMPLEX PO) Take 1 capsule by mouth daily.   Yes Historical Provider, MD  Multiple Vitamins-Minerals (THEREMS-M) TABS Take 1 tablet by mouth daily.   Yes Historical Provider, MD  nitroGLYCERIN (NITROSTAT) 0.3 MG SL tablet Place 0.4 mg under the tongue every 5 (five) minutes as needed for chest pain.   Yes Historical Provider, MD  ticagrelor (BRILINTA) 90 MG TABS tablet Take 90 mg by mouth 2 (two) times daily.   Yes Historical Provider, MD  VITAMIN A PO Take 1 tablet by mouth daily.   Yes Historical Provider, MD  vitamin C (ASCORBIC ACID) 500 MG tablet Take 1,000 mg by mouth 2 (two) times daily.    Yes Historical Provider, MD  VITAMIN E PO Take 1 tablet by mouth daily.   Yes Historical Provider, MD  mupirocin ointment (BACTROBAN) 2 % Apply  1 application topically daily as needed. 08/19/14   Historical Provider, MD   BP 131/63 mmHg  Pulse 79  Temp(Src) 97.8 F (36.6 C) (Oral)  Resp 18  Ht 5\' 10"  (1.778 m)  Wt 183 lb 3.2 oz (83.099 kg)  BMI 26.29 kg/m2  SpO2 99% Physical Exam  Constitutional: He appears well-developed and well-nourished.  HENT:  Head: Normocephalic.  Eyes: Pupils are equal, round, and reactive to light.  Neck: Neck supple.  Cardiovascular: Normal rate and regular rhythm.   Pulmonary/Chest: Effort normal.  Abdominal: Soft.  Genitourinary:  Foley catheter in place  Neurological: He is alert.  Skin: Skin is warm.  Lack of skin post grafting on right shoulder and right lower leg after previous necrotizing fasciitis    ED Course  Procedures (including critical care time) Labs Review Labs Reviewed  BASIC METABOLIC PANEL - Abnormal; Notable for the following:    Chloride 97 (*)    Glucose, Bld 111 (*)    BUN 37 (*)    Creatinine, Ser 1.79 (*)    Calcium 8.5 (*)    GFR calc non Af Amer 39 (*)    GFR calc Af Amer 45 (*)    All other components within normal limits  URINALYSIS, ROUTINE W REFLEX MICROSCOPIC (NOT AT [facility]) - Abnormal; Notable for the following:    Color, Urine RED (*)    APPearance TURBID (*)    Specific Gravity, Urine <1.005 (*)    pH >9.0 (*)    Glucose, UA 100 (*)    Hgb urine dipstick LARGE (*)    Bilirubin Urine MODERATE (*)    Ketones, ur TRACE (*)    Protein, ur >300 (*)    Urobilinogen, UA 2.0 (*)    Nitrite POSITIVE (*)    Leukocytes, UA LARGE (*)    All other components within normal limits  HEPATIC FUNCTION PANEL - Abnormal; Notable for the following:    Total Protein 6.1 (*)    Albumin 2.8 (*)    All other components within normal limits  CBC WITH DIFFERENTIAL/PLATELET -  Abnormal; Notable for the following:    WBC 11.8 (*)    RBC 4.15 (*)    Hemoglobin 12.6 (*)    Platelets 443 (*)    Monocytes Absolute 1.1 (*)    All other components within normal limits   URINE MICROSCOPIC-ADD ON - Abnormal; Notable for the following:    Bacteria, UA MANY (*)    Crystals TRIPLE PHOSPHATE CRYSTALS (*)    All other components within normal limits  URINE CULTURE  CBC  BASIC METABOLIC PANEL  I-STAT CG4 LACTIC ACID, ED    Imaging Review Dg Chest 2 View  11/15/2014   CLINICAL DATA:  Acute onset of generalized weakness and shortness of breath. Initial encounter.  EXAM: CHEST  2 VIEW  COMPARISON:  Chest radiograph from 05/25/2013  FINDINGS: The lungs are well-aerated. Pulmonary vascularity is at the upper limits of normal. There is no evidence of focal opacification, pleural effusion or pneumothorax. Mild biapical scarring is noted.  The heart is normal in size; the mediastinal contour is within normal limits. No acute osseous abnormalities are seen.  IMPRESSION: No acute cardiopulmonary process seen.   Electronically Signed   By: Garald Balding M.D.   On: 11/15/2014 19:31     EKG Interpretation   Date/Time:  Saturday November 15 2014 16:59:23 EDT Ventricular Rate:  73 PR Interval:  186 QRS Duration: 116 QT Interval:  412 QTC Calculation: 947 R Axis:   84 Text Interpretation:  Sinus rhythm Nonspecific intraventricular conduction  delay Confirmed by Saliah Crisp  MD, Ovid Curd [phone]) on 11/15/2014 11:40:55 PM      MDM   Final diagnoses:  Catheter-associated urinary tract infection  Renal insufficiency    Patient with generalized fatigue. Has catheter associated UTI and renal insufficiency. Will admit to internal medicine.    Davonna Belling, MD 11/15/14 [phone]

## 2014-11-15 NOTE — ED Notes (Signed)
Pt comes in by EMS for onset of weakness of the past 2 week. Pt has foley that has gross amounts of blood present. Upon EMS arrival BP 100/60 with increase to 122/66 after 300 mL bolus given via IV. NAD noted.

## 2014-11-15 NOTE — H&P (Signed)
History and Physical  Dale Shields WCH:[id number] DOB: 1951/03/23 DOA: 11/15/2014  Referring physician: Dr Alvino Chapel, ED physician PCP: Leola Brazil, MD   Chief Complaint: Weakness  HPI: Dale Shields is a 64 y.o. male  History of multiple myeloma, narcolepsy, GERD, neurogenic bladder with chronic indwelling Foley catheter, COPD, coronary artery disease with 3 stents, hyperlipidemia, chronic pain on chronic opiates.  Patient presents emergency department with 2 weeks of fatigue that has been constant and not increasing and not improving. Fatigue is worse with a type of activity. He has not been able to stand and move around as he normally does. No provoking or palliating factors. Patient presented to the emergency department where he was found to have a urinary tract infection. He does admit to darker urine over the past few days. Apparently had a syncopal episode and was more confused today.   Review of Systems:   Pt complains of decreased appetite, abdominal fullness.  Pt denies any fevers, chills, nausea, vomiting, diarrhea, constipation, chest pain, shortness of breath, palpitations.  Review of systems are otherwise negative  Past Medical History  Diagnosis Date  . Fibromyalgia   . Multiple sclerosis     with optic neuritis  . Narcolepsy   . Scoliosis   . Hypothyroidism   . HLD (hyperlipidemia)   . Anemia of chronic disease   . Thrombocytopenia   . Pulmonary embolism     "suspected; never confirmed"--? treated with coumadin  . COPD (chronic obstructive pulmonary disease)   . Insomnia   . Carpal tunnel syndrome   . GERD (gastroesophageal reflux disease)   . BPH (benign prostatic hypertrophy)   . History of gout   . Neurogenic bladder     chronic indwelling foley catheter-changed monthly  . Optic neuritis   . History of epistaxis   . History of MRSA infection 2012    UTI, bacteremia, and endocarditis  . Endocarditis 2012  . Supraspinatus tendon tear  2012  . Heart murmur   . Myocardial infarction     "according to stress test; that's news to me"  . Exertional dyspnea   . OSA (obstructive sleep apnea)     "tried CPAP; lost more sleep w/use"  . History of blood transfusion     "gave me too much coumadin and heparin once"  . H/O hiatal hernia   . Osteoarthritis of multiple joints     "everywhere"  . DDD (degenerative disc disease)     knees, hips, shoulders--Dr. Ronnie [facility] prior to this)  . CAD (coronary artery disease) 11/2011    Stents x 3  . Anxiety and depression   . Steroid-induced psychosis   . History of renal failure 2011    "on dialysis 3 times over 2 years"   Past Surgical History  Procedure Laterality Date  . Carpal tunnel release  ~ 2010    left with radius fracture repair  . Nasal cautery    . Skin graft  04/2009    right thigh to right "wrist all the way to my shoulder; necrotizing fasciitis  . Colonoscopy  2000; 2005; 03/15/2011    diminutve adenoma--recall 2017  . Coronary angioplasty  11/22/2011    3 vessels  . Coronary angioplasty with stent placement  11/22/2011    DES to LAD  . Knee arthroscopy w/ meniscal repair  1990's    right  . Left heart catheterization with coronary angiogram N/A 11/22/2011    Procedure: LEFT HEART CATHETERIZATION WITH CORONARY ANGIOGRAM;  Surgeon: Laverda Page, MD;  Location: [facility] CATH LAB;  Service: Cardiovascular;  Laterality: N/A;  . Percutaneous coronary stent intervention (pci-s)  11/22/2011    Procedure: PERCUTANEOUS CORONARY STENT INTERVENTION (PCI-S);  Surgeon: Laverda Page, MD;  Location: [facility] CATH LAB;  Service: Cardiovascular;;   Social History:  reports that he quit smoking about 3 months ago. He has never used smokeless tobacco. He reports that he does not drink alcohol or use illicit drugs. Patient lives at home & is able to participate in activities of daily living  Allergies  Allergen Reactions  . Ultram [Tramadol Hcl] Other (See Comments)    Seizures; "grand  mal; twice"    Family History  Problem Relation Age of Onset  . Alzheimer's disease Mother   . Colon cancer Neg Hx       Prior to Admission medications   Medication Sig Start Date End Date Taking? Authorizing Provider  acetaminophen (TYLENOL) 500 MG tablet Take 500 mg by mouth every 4 (four) hours as needed for mild pain, fever or headache.   Yes Historical Provider, MD  albuterol (PROVENTIL) (2.5 MG/3ML) 0.083% nebulizer solution Take 2.5 mg by nebulization every 6 (six) hours as needed for wheezing or shortness of breath.   Yes Historical Provider, MD  ALPRAZolam (XANAX) 0.25 MG tablet Take 0.25 mg by mouth 2 (two) times daily as needed for anxiety.   Yes Historical Provider, MD  APPLE CIDER VINEGAR PO Take 1 tablet by mouth daily.   Yes Historical Provider, MD  Armodafinil (NUVIGIL) 250 MG tablet Take 250 mg by mouth every morning.    Yes Historical Provider, MD  aspirin 81 MG chewable tablet Chew 81 mg by mouth daily.    Yes Historical Provider, MD  atorvastatin (LIPITOR) 10 MG tablet Take 10 mg by mouth daily.   Yes Historical Provider, MD  b complex vitamins tablet Take 1 tablet by mouth daily.   Yes Historical Provider, MD  buPROPion (WELLBUTRIN SR) 150 MG 12 hr tablet Take 150 mg by mouth 2 (two) times daily.   Yes Historical Provider, MD  calcium acetate (PHOSLO) 667 MG capsule Take 667 mg by mouth daily as needed (for indigestion).   Yes Historical Provider, MD  carvedilol (COREG) 6.25 MG tablet Take 6.25 mg by mouth 2 (two) times daily.    Yes Historical Provider, MD  Cholecalciferol (VITAMIN D PO) Take 1 tablet by mouth daily.   Yes Historical Provider, MD  clonazePAM (KLONOPIN) 0.5 MG tablet Take 0.5 mg by mouth every 12 (twelve) hours as needed for anxiety.   Yes Historical Provider, MD  DULoxetine (CYMBALTA) 60 MG capsule Take 1 capsule (60 mg total) by mouth 2 (two) times daily. 06/05/13  Yes Tammi Sou, MD  fentaNYL (DURAGESIC - DOSED MCG/HR) 100 MCG/HR Place 175 mcg  onto the skin every other day. Applies 165mg and 72m for a total of 17561mevery 72H   Yes Historical Provider, MD  fentaNYL (DURAGESIC - DOSED MCG/HR) 75 MCG/HR Place 175 mcg onto the skin every 3 (three) days. Applies 100m63mnd 75mc93mr a total of 175mcg17mry 72H 10/25/14  Yes Historical Provider, MD  Fluticasone-Salmeterol (ADVAIR) 250-50 MCG/DOSE AEPB Inhale 1 puff into the lungs 2 (two) times daily.   Yes Historical Provider, MD  levalbuterol ([facility]OPENB[facility] MCG/ACT inhaler Inhale 2 puffs into the lungs every 4 (four) hours as needed for wheezing.   Yes Historical Provider, MD  loperamide (IMODIUM) 2 MG capsule Take 2 mg by  mouth as needed for diarrhea or loose stools.    Yes Historical Provider, MD  Misc Natural Products (RED WINE COMPLEX PO) Take 1 capsule by mouth daily.   Yes Historical Provider, MD  Multiple Vitamins-Minerals (THEREMS-M) TABS Take 1 tablet by mouth daily.   Yes Historical Provider, MD  nitroGLYCERIN (NITROSTAT) 0.3 MG SL tablet Place 0.4 mg under the tongue every 5 (five) minutes as needed for chest pain.   Yes Historical Provider, MD  ticagrelor (BRILINTA) 90 MG TABS tablet Take 90 mg by mouth 2 (two) times daily.   Yes Historical Provider, MD  VITAMIN A PO Take 1 tablet by mouth daily.   Yes Historical Provider, MD  vitamin C (ASCORBIC ACID) 500 MG tablet Take 1,000 mg by mouth 2 (two) times daily.    Yes Historical Provider, MD  VITAMIN E PO Take 1 tablet by mouth daily.   Yes Historical Provider, MD  mupirocin ointment (BACTROBAN) 2 % Apply 1 application topically daily as needed. 08/19/14   Historical Provider, MD    Physical Exam: BP 141/97 mmHg  Pulse 68  Temp(Src) 97.9 F (36.6 C) (Oral)  Resp 20  Ht '5\' 10"'  (1.778 m)  Wt 86.183 kg (190 lb)  BMI 27.26 kg/m2  SpO2 99%  General: Elderly Caucasian male. Awake and alert and oriented x3. No acute cardiopulmonary distress.  Eyes: Pupils equal, round, reactive to light. Extraocular muscles are intact.  Sclerae anicteric and noninjected.  ENT: Moist mucosal membranes. No mucosal lesions.  Neck: Neck supple without lymphadenopathy. No carotid bruits. No masses palpated.  Cardiovascular: Regular rate with normal S1-S2 sounds. No murmurs, rubs, gallops auscultated. No JVD.  Respiratory: Good respiratory effort with no wheezes, rales, rhonchi. Lungs clear to auscultation bilaterally.  Abdomen: Soft, nontender, nondistended. Active bowel sounds. No masses or hepatosplenomegaly  Skin: Dry, warm to touch. 2+ dorsalis pedis and radial pulses. Patient has significant scarring of the right shoulder with absent deltoid muscle which he explains is due to history of necrotizing fasciitis. There is a 2 cm abrasion on his left shoulder that appears to be healing Musculoskeletal: No calf or leg pain. All major joints not erythematous nontender.  Psychiatric: Intact judgment and insight.  Neurologic: Limited mobility of his lower extremities, which is chronic in nature. Cranial nerves II through XII are grossly intact.           Labs on Admission:  Basic Metabolic Panel:  Recent Labs Lab 11/15/14 1722  NA 136  K 4.0  CL 97*  CO2 29  GLUCOSE 111*  BUN 37*  CREATININE 1.79*  CALCIUM 8.5*   Liver Function Tests:  Recent Labs Lab 11/15/14 1722  AST 33  ALT 49  ALKPHOS 89  BILITOT 0.4  PROT 6.1*  ALBUMIN 2.8*   No results for input(s): LIPASE, AMYLASE in the last 168 hours. No results for input(s): AMMONIA in the last 168 hours. CBC:  Recent Labs Lab 11/15/14 1722  WBC 11.8*  NEUTROABS 6.9  HGB 12.6*  HCT 40.1  MCV 96.6  PLT 443*   Cardiac Enzymes: No results for input(s): CKTOTAL, CKMB, CKMBINDEX, TROPONINI in the last 168 hours.  BNP (last 3 results) No results for input(s): BNP in the last 8760 hours.  ProBNP (last 3 results) No results for input(s): PROBNP in the last 8760 hours.  CBG: No results for input(s): GLUCAP in the last 168 hours.  Radiological Exams on  Admission: Dg Chest 2 View  11/15/2014   CLINICAL DATA:  Acute  onset of generalized weakness and shortness of breath. Initial encounter.  EXAM: CHEST  2 VIEW  COMPARISON:  Chest radiograph from 05/25/2013  FINDINGS: The lungs are well-aerated. Pulmonary vascularity is at the upper limits of normal. There is no evidence of focal opacification, pleural effusion or pneumothorax. Mild biapical scarring is noted.  The heart is normal in size; the mediastinal contour is within normal limits. No acute osseous abnormalities are seen.  IMPRESSION: No acute cardiopulmonary process seen.   Electronically Signed   By: Garald Balding M.D.   On: 11/15/2014 19:31    EKG: Independently reviewed. Normal sinus rhythm. No ST changes. No evidence of STEMI  Assessment/Plan Present on Admission:  . Urinary tract infection . Acute renal injury  This patient was discussed with the ED physician, including pertinent vitals, physical exam findings, labs, and imaging.  We also discussed care given by the ED provider.  #1 urinary tract infection  Admit to MedSurg - will admit secondary to indwelling catheter in the potentiality to have resistant antibiotics  While he does have an indwelling catheter, he has no hospitalization in the past 90 days. Additionally, in 2013 he had a urine culture that grew out Klebsiella that was sensitive to ceftriaxone and most other antibiotics. Other more recent cultures were mixed flora Continue ceftriaxone 1 g every 24 hours  Urine culture sent from the emergency department  Recheck CBC in the morning #2 acute renal injury  IV fluids 125 mL per hour  Recheck creatinine in the morning #3 multiple sclerosis #4 chronic pain  Continued Duragesic patches #5 COPD  Continue home inhalers #6 CAD  Continue antiplatelets  DVT prophylaxis: Lovenox  Consultants: None  Code Status: Full code  Family Communication: Brother-in-law in the room   Disposition Plan: Admission for  treatment   Truett Mainland, DO [facility] Pager [phone]

## 2014-11-15 NOTE — ED Notes (Signed)
Pt requesting pain medication. States he did not apply his pain patch this morning. EDP notified.

## 2014-11-15 NOTE — ED Notes (Signed)
MD at bedside. Dr Nehemiah Settle

## 2014-11-15 NOTE — ED Notes (Signed)
MD at bedside. 

## 2014-11-16 DIAGNOSIS — G9340 Encephalopathy, unspecified: Secondary | ICD-10-CM | POA: Diagnosis present

## 2014-11-16 DIAGNOSIS — D638 Anemia in other chronic diseases classified elsewhere: Secondary | ICD-10-CM | POA: Diagnosis present

## 2014-11-16 DIAGNOSIS — R531 Weakness: Secondary | ICD-10-CM

## 2014-11-16 DIAGNOSIS — N3001 Acute cystitis with hematuria: Secondary | ICD-10-CM

## 2014-11-16 DIAGNOSIS — N179 Acute kidney failure, unspecified: Secondary | ICD-10-CM | POA: Diagnosis present

## 2014-11-16 DIAGNOSIS — R627 Adult failure to thrive: Secondary | ICD-10-CM | POA: Diagnosis present

## 2014-11-16 DIAGNOSIS — R319 Hematuria, unspecified: Secondary | ICD-10-CM | POA: Diagnosis present

## 2014-11-16 LAB — CBC
HCT: 37 % — ABNORMAL LOW (ref 39.0–52.0)
HCT: 32.4 % — ABNORMAL LOW (ref 39.0–52.0)
HCT: 31.9 % — ABNORMAL LOW (ref 39.0–52.0)
HEMOGLOBIN: 11.5 g/dL — AB (ref 13.0–17.0)
Hemoglobin: 10.4 g/dL — ABNORMAL LOW (ref 13.0–17.0)
Hemoglobin: 10.1 g/dL — ABNORMAL LOW (ref 13.0–17.0)
MCH: 30.3 pg (ref 26.0–34.0)
MCH: 31 pg (ref 26.0–34.0)
MCH: 30.4 pg (ref 26.0–34.0)
MCHC: 31.1 g/dL (ref 30.0–36.0)
MCHC: 32.1 g/dL (ref 30.0–36.0)
MCHC: 31.7 g/dL (ref 30.0–36.0)
MCV: 97.6 fL (ref 78.0–100.0)
MCV: 96.7 fL (ref 78.0–100.0)
MCV: 96.1 fL (ref 78.0–100.0)
Platelets: 427 10*3/uL — ABNORMAL HIGH (ref 150–400)
Platelets: 356 10*3/uL (ref 150–400)
Platelets: 349 10*3/uL (ref 150–400)
RBC: 3.79 MIL/uL — ABNORMAL LOW (ref 4.22–5.81)
RBC: 3.35 MIL/uL — ABNORMAL LOW (ref 4.22–5.81)
RBC: 3.32 MIL/uL — AB (ref 4.22–5.81)
RDW: 13.4 % (ref 11.5–15.5)
RDW: 13.4 % (ref 11.5–15.5)
RDW: 13.3 % (ref 11.5–15.5)
WBC: 12.7 10*3/uL — ABNORMAL HIGH (ref 4.0–10.5)
WBC: 10.3 10*3/uL (ref 4.0–10.5)
WBC: 10.6 10*3/uL — ABNORMAL HIGH (ref 4.0–10.5)

## 2014-11-16 LAB — BASIC METABOLIC PANEL
ANION GAP: 9 (ref 5–15)
BUN: 36 mg/dL — ABNORMAL HIGH (ref 6–20)
CO2: 28 mmol/L (ref 22–32)
Calcium: 8.1 mg/dL — ABNORMAL LOW (ref 8.9–10.3)
Chloride: 98 mmol/L — ABNORMAL LOW (ref 101–111)
Creatinine, Ser: 1.63 mg/dL — ABNORMAL HIGH (ref 0.61–1.24)
GFR calc non Af Amer: 44 mL/min — ABNORMAL LOW (ref >60)
GFR, EST AFRICAN AMERICAN: 51 mL/min — AB (ref >60)
Glucose, Bld: 124 mg/dL — ABNORMAL HIGH (ref 65–99)
Potassium: 3.8 mmol/L (ref 3.5–5.1)
Sodium: 135 mmol/L (ref 135–145)

## 2014-11-16 MED ORDER — PANTOPRAZOLE SODIUM 40 MG PO TBEC
40.0000 mg | DELAYED_RELEASE_TABLET | Freq: Every day | ORAL | Status: DC
  Administered 2014-11-16 – 2014-11-19 (×4): 40 mg via ORAL
  Filled 2014-11-16 (×4): qty 1

## 2014-11-16 MED ORDER — SODIUM CHLORIDE 0.9 % IV SOLN
INTRAVENOUS | Status: DC
  Administered 2014-11-16 – 2014-11-17 (×4): via INTRAVENOUS

## 2014-11-16 MED ORDER — CALCIUM CARBONATE ANTACID 500 MG PO CHEW
1.0000 | CHEWABLE_TABLET | Freq: Three times a day (TID) | ORAL | Status: DC
  Administered 2014-11-16 – 2014-11-19 (×8): 200 mg via ORAL
  Filled 2014-11-16 (×8): qty 1

## 2014-11-16 MED ORDER — MODAFINIL 200 MG PO TABS
200.0000 mg | ORAL_TABLET | Freq: Every day | ORAL | Status: DC
  Administered 2014-11-16 – 2014-11-19 (×4): 200 mg via ORAL
  Filled 2014-11-16 (×4): qty 1

## 2014-11-16 NOTE — Progress Notes (Signed)
PROGRESS NOTE  Dale Shields [id number]:[id number] DOB: [birth year] DOA: 11/15/2014 PCP: Leola Brazil, MD  Summary: 53 yom PMH MS, neurogenic bladder with chronic indwelling Foley catheter, chronic pain on fentanyl patch presented with complaints of generalized weakness, confusion and syncope. Admitted for UTI, AKI. Reported poor appetite, acid reflux and weight loss x2 weeks.  Assessment/Plan: 1. UTI catheter-associated chronic indwelling foley present on admission with gross hematuria.  2. Gross hematuria. Reports last catheter change was end of July. On ASA and Brilinta. Started 48 hours prior to admission. 3. AKI non-oliguric, etiology unclear, improved with IVF. Potassium normal  4. Generalized weakness.  5. FTT, poor oral intake, weight loss. Possibly secondary to infection. 6. Acute encephalopathy, resolved. Suspect secondary to infection. 7. Syncope prior to admission, patient cannot recall details.  8. Hypotension, resolved. Etiology unclear, likely poor oral intake. 9. Multiple sclerosis, neurogenic bladder with chronic indwelling Foley catheter 10. Chronic pain, fibromyalgia on high dose Duragesic 11. Anemia of chronic disease, Hgb close to baseline. Monitor with hematuria. 12. COPD 13. CAD   Appears stable.  Plan to continue empiric abx and f/u culture  Trend Hgb, will continue ASA and Brilinta for now. Will confer with cardiology prior to stopping if Hgb drops.  Stop Lovenox  IVF, check BMP in AM  PT consult  Code Status: Full DVT prophylaxis: Lovenox Family Communication: none present. Patient alert and understands plan Disposition Plan: home  [name], MD  [facility]  Pager [phone] If 7PM-7AM, please contact night-coverage at [email or web address], password [facility] 11/16/2014, 6:49 AM  LOS: 1 day   Consultants:    Procedures:    Antibiotics:  Rocephin 11/16/14>>  HPI/Subjective: Fells weak. No n/v/abd pain. No diarrhea. No  SOB.  Objective: Filed Vitals:   11/15/14 2002 11/15/14 2110 11/15/14 2142 11/16/14 0545  BP: 141/97 131/63  112/74  Pulse: 68 79  73  Temp:  97.8 F (36.6 C)  97.6 F (36.4 C)  TempSrc:  Oral  Oral  Resp: 20 18  18   Height:  5\' 10"  (1.778 m)    Weight:  83.099 kg (183 lb 3.2 oz)    SpO2: 99% 95% 99% 97%    Intake/Output Summary (Last 24 hours) at 11/16/14 0649 Last data filed at 11/16/14 0606  Gross per 24 hour  Intake 2106.25 ml  Output      0 ml  Net 2106.25 ml     Filed Weights   11/15/14 1654 11/15/14 2110  Weight: 86.183 kg (190 lb) 83.099 kg (183 lb 3.2 oz)    Exam: Afebrile, VSS, not hypoxic  General: Appears calm and comfortable. Appears weak. Eyes: PERRL, normal lids, irises   ENT: grossly normal hearing, lips & tongue Cardiovascular: RRR, no m/r/g. No LE edema. Respiratory: CTA bilaterally, no w/r/r. Normal respiratory effort. Abdomen: soft, ntnd Skin: skin graft RUE Psychiatric: grossly normal mood and affect, speech fluent and appropriate. Oriented to self, location, month, year  New data reviewed:  UOP 750  Creatinine 1.79 >> 1.63  Hgb 12.6 >> 11.5  Pertinent data since admission:  Lactic acid was normal  U/A with increased WBC, TNTC RBC  CXR NAD  EKG SR, nonspecific IVCD  Pending data:  UC  Scheduled Meds: . Armodafinil  250 mg Oral q morning - 10a  . aspirin  81 mg Oral Daily  . atorvastatin  10 mg Oral q1800  . buPROPion  150 mg Oral BID  . carvedilol  6.25 mg Oral BID WC  . cefTRIAXone (ROCEPHIN)  IV  1 g Intravenous Q24H  . DULoxetine  60 mg Oral BID  . enoxaparin (LOVENOX) injection  40 mg Subcutaneous Q24H  . fentaNYL  175 mcg Transdermal Q48H  . mometasone-formoterol  2 puff Inhalation BID  . ticagrelor  90 mg Oral BID   Continuous Infusions: . sodium chloride 125 mL/hr at 11/16/14 0606    Principal Problem:   Urinary tract infection Active Problems:   Multiple sclerosis   CAD (coronary artery disease)   AKI  (acute kidney injury)   Hematuria   Generalized weakness   FTT (failure to thrive) in adult   Acute encephalopathy   Anemia of chronic disease   Time spent 35 minutes  I, Jessica D. Leonie Green, acting as scribe, recorded this note contemporaneously in the presence of Dr. Melene Plan. Sarajane Jews, M.D. on 11/16/2014 .   I have reviewed the above documentation for accuracy and completeness, and I agree with the above. [name], MD

## 2014-11-17 ENCOUNTER — Inpatient Hospital Stay (HOSPITAL_COMMUNITY): Payer: Medicare Other

## 2014-11-17 DIAGNOSIS — R310 Gross hematuria: Secondary | ICD-10-CM

## 2014-11-17 DIAGNOSIS — N179 Acute kidney failure, unspecified: Secondary | ICD-10-CM | POA: Diagnosis not present

## 2014-11-17 DIAGNOSIS — N132 Hydronephrosis with renal and ureteral calculous obstruction: Secondary | ICD-10-CM | POA: Diagnosis not present

## 2014-11-17 DIAGNOSIS — E440 Moderate protein-calorie malnutrition: Secondary | ICD-10-CM | POA: Diagnosis present

## 2014-11-17 LAB — BASIC METABOLIC PANEL WITH GFR
Anion gap: 6 (ref 5–15)
BUN: 24 mg/dL — ABNORMAL HIGH (ref 6–20)
CO2: 27 mmol/L (ref 22–32)
Calcium: 8 mg/dL — ABNORMAL LOW (ref 8.9–10.3)
Chloride: 107 mmol/L (ref 101–111)
Creatinine, Ser: 1.06 mg/dL (ref 0.61–1.24)
GFR calc Af Amer: >60 mL/min (ref >60)
GFR calc non Af Amer: >60 mL/min (ref >60)
Glucose, Bld: 113 mg/dL — ABNORMAL HIGH (ref 65–99)
Potassium: 3.6 mmol/L (ref 3.5–5.1)
Sodium: 140 mmol/L (ref 135–145)

## 2014-11-17 LAB — CBC
HCT: 33.7 % — ABNORMAL LOW (ref 39.0–52.0)
Hemoglobin: 10.8 g/dL — ABNORMAL LOW (ref 13.0–17.0)
MCH: 30.7 pg (ref 26.0–34.0)
MCHC: 32 g/dL (ref 30.0–36.0)
MCV: 95.7 fL (ref 78.0–100.0)
Platelets: 362 K/uL (ref 150–400)
RBC: 3.52 MIL/uL — ABNORMAL LOW (ref 4.22–5.81)
RDW: 13.4 % (ref 11.5–15.5)
WBC: 10.8 K/uL — ABNORMAL HIGH (ref 4.0–10.5)

## 2014-11-17 MED ORDER — SODIUM CHLORIDE 0.9 % IV SOLN
INTRAVENOUS | Status: AC
  Administered 2014-11-17: 15:00:00
  Filled 2014-11-17: qty 250

## 2014-11-17 MED ORDER — FENTANYL 75 MCG/HR TD PT72
175.0000 ug | MEDICATED_PATCH | TRANSDERMAL | Status: DC
  Administered 2014-11-17: 175 ug via TRANSDERMAL
  Filled 2014-11-17 (×2): qty 1

## 2014-11-17 MED ORDER — IOHEXOL 300 MG/ML  SOLN: 125.0000 mL | Freq: Once | INTRAMUSCULAR | Status: DC | PRN

## 2014-11-17 MED ORDER — ENSURE ENLIVE PO LIQD
237.0000 mL | Freq: Two times a day (BID) | ORAL | Status: DC
  Administered 2014-11-17 – 2014-11-19 (×4): 237 mL via ORAL

## 2014-11-17 NOTE — Progress Notes (Signed)
[facility] PROGRESS NOTE  Dale Shields [id number] DOB: 25-Apr-1950 DOA: 11/15/2014 PCP: Leola Brazil, MD  Assessment/Plan: 1. UTI catheter-associated chronic indwelling foley present on admission with gross hematuria. Afebrile hemodynamically stable and non-toxic. Urine culture with >100,000 proteus mirabilis. Continue rocephin day #2.  2. Gross hematuria. Reports last catheter change was end of July. On ASA and Brilinta. Started 48 hours prior to admission. Discussed with urology who recommended CT abdomen/pelvis with and without contrast. He opined that if Hg stable and urine flow good and urine is clearing, OP cysto is what recommend 3. AKI non-oliguric, etiology unclear. Resolved with IV fluid. monitor  4. Generalized weakness. improving. Will request PT consult 5. FTT, poor oral intake, weight loss. Appetite slowly improving.  Possibly secondary to infection. 6. Acute encephalopathy, resolved. Suspect secondary to infection. 7. Syncope prior to admission, patient cannot recall details. Will check orthostatics, mobilize with PT 8. Hypotension, resolved. Etiology unclear, likely poor oral intake. 9. Multiple sclerosis, neurogenic bladder with chronic indwelling Foley catheter. 10. Chronic pain, fibromyalgia on high dose Duragesic. Continue home regimen 11. Anemia of chronic disease, Hgb close to baseline and stable. Monitor with hematuria. 12. COPD: appears stable at baseline 13. CAD: no chest pain  Code Status: full Family Communication: none present Disposition Plan: home when ready   Consultants:  Urology per phone  Procedures:  none  Antibiotics:  Rocephin 11/16/14>>  HPI/Subjective: Lying in bed states he feels "a little better"  Objective: Filed Vitals:   11/17/14 0535  BP: 123/68  Pulse: 72  Temp: 98 F (36.7 C)  Resp: 18    Intake/Output Summary (Last 24 hours) at 11/17/14 1342 Last data filed at 11/17/14 1126  Gross per 24 hour   Intake 1664.59 ml  Output   3400 ml  Net -1735.41 ml   Filed Weights   11/15/14 1654 11/15/14 2110  Weight: 86.183 kg (190 lb) 83.099 kg (183 lb 3.2 oz)    Exam:   General:  Well nourished appears comfortable slightly pale  Cardiovascular: RRR no MGR No LE edema  Respiratory: normal effort BS clear bilaterally no wheeze  Abdomen: obese soft +BS foley draining cloudy ruby colored urine  Musculoskeletal: poor muscle tone    Data Reviewed: Basic Metabolic Panel:  Recent Labs Lab 11/15/14 1722 11/16/14 0618 11/17/14 0610  NA 136 135 140  K 4.0 3.8 3.6  CL 97* 98* 107  CO2 29 28 27   GLUCOSE 111* 124* 113*  BUN 37* 36* 24*  CREATININE 1.79* 1.63* 1.06  CALCIUM 8.5* 8.1* 8.0*   Liver Function Tests:  Recent Labs Lab 11/15/14 1722  AST 33  ALT 49  ALKPHOS 89  BILITOT 0.4  PROT 6.1*  ALBUMIN 2.8*   No results for input(s): LIPASE, AMYLASE in the last 168 hours. No results for input(s): AMMONIA in the last 168 hours. CBC:  Recent Labs Lab 11/15/14 1722 11/16/14 0618 11/16/14 1644 11/16/14 2137 11/17/14 0610  WBC 11.8* 12.7* 10.3 10.6* 10.8*  NEUTROABS 6.9  --   --   --   --   HGB 12.6* 11.5* 10.4* 10.1* 10.8*  HCT 40.1 37.0* 32.4* 31.9* 33.7*  MCV 96.6 97.6 96.7 96.1 95.7  PLT 443* 427* 356 349 362   Cardiac Enzymes: No results for input(s): CKTOTAL, CKMB, CKMBINDEX, TROPONINI in the last 168 hours. BNP (last 3 results) No results for input(s): BNP in the last 8760 hours.  ProBNP (last 3 results) No results for input(s): PROBNP in the last 8760  hours.  CBG: No results for input(s): GLUCAP in the last 168 hours.  Recent Results (from the past 240 hour(s))  Urine culture     Status: None (Preliminary result)   Collection Time: 11/15/14  6:04 PM  Result Value Ref Range Status   Specimen Description URINE, CLEAN CATCH  Final   Special Requests NONE  Final   Culture   Final    >=100,000 COLONIES/mL PROTEUS MIRABILIS Performed at [facility]    Report Status PENDING  Incomplete     Studies: Dg Chest 2 View  11/15/2014   CLINICAL DATA:  Acute onset of generalized weakness and shortness of breath. Initial encounter.  EXAM: CHEST  2 VIEW  COMPARISON:  Chest radiograph from 05/25/2013  FINDINGS: The lungs are well-aerated. Pulmonary vascularity is at the upper limits of normal. There is no evidence of focal opacification, pleural effusion or pneumothorax. Mild biapical scarring is noted.  The heart is normal in size; the mediastinal contour is within normal limits. No acute osseous abnormalities are seen.  IMPRESSION: No acute cardiopulmonary process seen.   Electronically Signed   By: Garald Balding M.D.   On: 11/15/2014 19:31    Scheduled Meds: . aspirin  81 mg Oral Daily  . atorvastatin  10 mg Oral q1800  . buPROPion  150 mg Oral BID  . calcium carbonate  1 tablet Oral TID WC  . carvedilol  6.25 mg Oral BID WC  . cefTRIAXone (ROCEPHIN)  IV  1 g Intravenous Q24H  . DULoxetine  60 mg Oral BID  . fentaNYL  175 mcg Transdermal Q48H  . modafinil  200 mg Oral Daily  . mometasone-formoterol  2 puff Inhalation BID  . pantoprazole  40 mg Oral Daily  . ticagrelor  90 mg Oral BID   Continuous Infusions: . sodium chloride 125 mL/hr at 11/17/14 0540    Principal Problem:   Urinary tract infection Active Problems:   Multiple sclerosis   CAD (coronary artery disease)   AKI (acute kidney injury)   Hematuria   Generalized weakness   FTT (failure to thrive) in adult   Acute encephalopathy   Anemia of chronic disease    Time spent: 30 minutes    [facility] Pager [phone]. If 7PM-7AM, please contact night-coverage at [email or web address], password [facility] 11/17/2014, 1:42 PM  LOS: 2 days

## 2014-11-17 NOTE — Progress Notes (Signed)
Initial Nutrition Assessment  DOCUMENTATION CODES:   Non-severe (moderate) malnutrition in context of acute illness/injury  Pt meets criteria for moderate MALNUTRITION in the context of acute illness as evidenced by <75% energy intake for >7 days and mild depletion of muscle and fat.   INTERVENTION:  Ensure Enlive po BID, each supplement provides 350 kcal and 20 grams of protein   RD will continue to follow    NUTRITION DIAGNOSIS:   Inadequate oral intake related to inability to eat as evidenced by per patient/family report.   GOAL:   Patient will meet greater than or equal to 90% of their needs   MONITOR:   PO intake, Supplement acceptance, Weight trends, Labs, Skin  REASON FOR ASSESSMENT:   Malnutrition Screening Tool    ASSESSMENT: Pt presents with UTI and gross hematuria, AKI, FTT and poor oral intake the past week as well as wt loss prior to admission.  Pt says he is feeling better today and his eating is better. He feeds himself and denies chewing or swallow problems. Home diet is regular. He likes Ensure and is amiable to add this to his nutrition regimen for repletion. Recommend he continue to consume for at least 30 days after discharge.   Labs: BUN improved from 36 yesterday to 24 today.   Diet Order:  Diet Heart Room service appropriate?: Yes; Fluid consistency:: Thin  Skin:   WDL  Last BM:   8/7  Height:   Ht Readings from Last 1 Encounters:  11/15/14 5\' 10"  (1.778 m)    Weight:   Wt Readings from Last 1 Encounters:  11/15/14 183 lb 3.2 oz (83.099 kg)    Ideal Body Weight:  75.4 kg  BMI:  Body mass index is 26.29 kg/(m^2).  Estimated Nutritional Needs:   Kcal:  1700-2000 kcal  Protein:  91-99 gr  Fluid:  1.7-2.0 liters daily  EDUCATION NEEDS:   Education needs addressed : encouraged regular intake of lean protein, fruits vegetables and whole grains.  Colman Cater MS,RD,CSG,LDN Office: [phone] Pager: [phone]

## 2014-11-17 NOTE — Consult Note (Signed)
Consult: Gross hematuria Requested by: NP Black/Dr. Sarajane Jews   History of Present Illness: 64 year old white male with history of MS an indwelling Foley who follows with Dr. Gaynelle Arabian was admitted for malaise and poor by mouth intake for a couple of weeks. He had acute kidney injury. He has history of MS and chronic Foley catheter. This is changed once a month. Urine cultures growing Proteus. After hydration and some antibiotic's he is feeling much better today. His urine is clearing.  Because of the gross hematuria underwent CT scan of the abdomen and pelvis without contrast. This showed right hydroureteronephrosis with a dilated ureter down toward the right ureterovesical junction. There was no obvious stone but I thought there may been some higher density material in the ureter. There was a left extrarenal pelvis. The bladder was decompressed with a catheter.    Past Medical History  Diagnosis Date  . Fibromyalgia   . Multiple sclerosis     with optic neuritis  . Narcolepsy   . Scoliosis   . Hypothyroidism   . HLD (hyperlipidemia)   . Anemia of chronic disease   . Thrombocytopenia   . Pulmonary embolism     "suspected; never confirmed"--? treated with coumadin  . COPD (chronic obstructive pulmonary disease)   . Insomnia   . Carpal tunnel syndrome   . GERD (gastroesophageal reflux disease)   . BPH (benign prostatic hypertrophy)   . History of gout   . Neurogenic bladder     chronic indwelling foley catheter-changed monthly  . Optic neuritis   . History of epistaxis   . History of MRSA infection 2012    UTI, bacteremia, and endocarditis  . Endocarditis 2012  . Supraspinatus tendon tear 2012  . Heart murmur   . Myocardial infarction     "according to stress test; that's news to me"  . Exertional dyspnea   . OSA (obstructive sleep apnea)     "tried CPAP; lost more sleep w/use"  . History of blood transfusion     "gave me too much coumadin and heparin once"  . H/O hiatal  hernia   . Osteoarthritis of multiple joints     "everywhere"  . DDD (degenerative disc disease)     knees, hips, shoulders--Dr. Ronnie Derby [facility] prior to this)  . CAD (coronary artery disease) 11/2011    Stents x 3  . Anxiety and depression   . Steroid-induced psychosis   . History of renal failure 2011    "on dialysis 3 times over 2 years"   Past Surgical History  Procedure Laterality Date  . Carpal tunnel release  ~ 2010    left with radius fracture repair  . Nasal cautery    . Skin graft  04/2009    right thigh to right "wrist all the way to my shoulder; necrotizing fasciitis  . Colonoscopy  2000; 2005; 03/15/2011    diminutve adenoma--recall 2017  . Coronary angioplasty  11/22/2011    3 vessels  . Coronary angioplasty with stent placement  11/22/2011    DES to LAD  . Knee arthroscopy w/ meniscal repair  1990's    right  . Left heart catheterization with coronary angiogram N/A 11/22/2011    Procedure: LEFT HEART CATHETERIZATION WITH CORONARY ANGIOGRAM;  Surgeon: Laverda Page, MD;  Location: [facility] CATH LAB;  Service: Cardiovascular;  Laterality: N/A;  . Percutaneous coronary stent intervention (pci-s)  11/22/2011    Procedure: PERCUTANEOUS CORONARY STENT INTERVENTION (PCI-S);  Surgeon: Laverda Page, MD;  Location:  [facility] CATH LAB;  Service: Cardiovascular;;    Home Medications:  Prescriptions prior to admission  Medication Sig Dispense Refill Last Dose  . acetaminophen (TYLENOL) 500 MG tablet Take 500 mg by mouth every 4 (four) hours as needed for mild pain, fever or headache.   Past Month at Unknown time  . albuterol (PROVENTIL) (2.5 MG/3ML) 0.083% nebulizer solution Take 2.5 mg by nebulization every 6 (six) hours as needed for wheezing or shortness of breath.   Past Week at Unknown time  . ALPRAZolam (XANAX) 0.25 MG tablet Take 0.25 mg by mouth 2 (two) times daily as needed for anxiety.   11/14/2014 at Unknown time  . APPLE CIDER VINEGAR PO Take 1 tablet by mouth daily.   11/14/2014 at  Unknown time  . Armodafinil (NUVIGIL) 250 MG tablet Take 250 mg by mouth every morning.    11/15/2014 at Unknown time  . aspirin 81 MG chewable tablet Chew 81 mg by mouth daily.    11/15/2014 at Unknown time  . atorvastatin (LIPITOR) 10 MG tablet Take 10 mg by mouth daily.   11/15/2014 at Unknown time  . b complex vitamins tablet Take 1 tablet by mouth daily.   11/15/2014 at Unknown time  . buPROPion (WELLBUTRIN SR) 150 MG 12 hr tablet Take 150 mg by mouth 2 (two) times daily.   11/15/2014 at Unknown time  . calcium acetate (PHOSLO) 667 MG capsule Take 667 mg by mouth daily as needed (for indigestion).   Past Month at Unknown time  . carvedilol (COREG) 6.25 MG tablet Take 6.25 mg by mouth 2 (two) times daily.    11/15/2014 at [place]  . Cholecalciferol (VITAMIN D PO) Take 1 tablet by mouth daily.   Past Month at Unknown time  . clonazePAM (KLONOPIN) 0.5 MG tablet Take 0.5 mg by mouth every 12 (twelve) hours as needed for anxiety.   11/15/2014 at Unknown time  . DULoxetine (CYMBALTA) 60 MG capsule Take 1 capsule (60 mg total) by mouth 2 (two) times daily. 60 capsule 1 11/15/2014 at Unknown time  . fentaNYL (DURAGESIC - DOSED MCG/HR) 100 MCG/HR Place 175 mcg onto the skin every other day. Applies 132mcg and 27mcg for a total of 132mcg every 72H   11/14/2014 at Unknown time  . fentaNYL (DURAGESIC - DOSED MCG/HR) 75 MCG/HR Place 175 mcg onto the skin every 3 (three) days. Applies 132mcg and 91mcg for a total of 122mcg every 72H   11/15/2014 at Unknown time  . Fluticasone-Salmeterol (ADVAIR) 250-50 MCG/DOSE AEPB Inhale 1 puff into the lungs 2 (two) times daily.   11/14/2014 at Unknown time  . levalbuterol (XOPENEX HFA) 45 MCG/ACT inhaler Inhale 2 puffs into the lungs every 4 (four) hours as needed for wheezing.   Past Week at Unknown time  . loperamide (IMODIUM) 2 MG capsule Take 2 mg by mouth as needed for diarrhea or loose stools.    11/14/2014 at Unknown time  . Misc Natural Products (RED WINE COMPLEX PO) Take 1 capsule by mouth  daily.   11/14/2014 at Unknown time  . Multiple Vitamins-Minerals (THEREMS-M) TABS Take 1 tablet by mouth daily.   Past Month at Unknown time  . nitroGLYCERIN (NITROSTAT) 0.3 MG SL tablet Place 0.4 mg under the tongue every 5 (five) minutes as needed for chest pain.   unknown  . ticagrelor (BRILINTA) 90 MG TABS tablet Take 90 mg by mouth 2 (two) times daily.   11/14/2014 at Unknown time  . VITAMIN A PO Take 1 tablet  by mouth daily.   Past Week at Unknown time  . vitamin C (ASCORBIC ACID) 500 MG tablet Take 1,000 mg by mouth 2 (two) times daily.    11/15/2014 at Unknown time  . VITAMIN E PO Take 1 tablet by mouth daily.   11/15/2014 at Unknown time  . mupirocin ointment (BACTROBAN) 2 % Apply 1 application topically daily as needed.      Allergies:  Allergies  Allergen Reactions  . Ultram [Tramadol Hcl] Other (See Comments)    Seizures; "grand mal; twice"    Family History  Problem Relation Age of Onset  . Alzheimer's disease Mother   . Colon cancer Neg Hx    Social History:  reports that he quit smoking about 3 months ago. He has never used smokeless tobacco. He reports that he does not drink alcohol or use illicit drugs.  ROS: A complete review of systems was performed.  All systems are negative except for pertinent findings as noted. Review of Systems  All other systems reviewed and are negative.   Intake/Output Summary (Last 24 hours) at 11/17/14 2158 Last data filed at 11/17/14 1626  Gross per 24 hour  Intake 2571.67 ml  Output   2150 ml  Net 421.67 ml     Physical Exam:  Vital signs in last 24 hours: Temp:  [97.4 F (36.3 C)-98 F (36.7 C)] 97.4 F (36.3 C) (08/08 1432) Pulse Rate:  [72] 72 (08/08 1432) Resp:  [18] 18 (08/08 1432) BP: (123-143)/(68-72) 143/72 mmHg (08/08 1432) SpO2:  [96 %-98 %] 97 % (08/08 2005) General:  Alert and oriented, No acute distress HEENT: Normocephalic, atraumatic Neck: No JVD or lymphadenopathy Cardiovascular: Regular rate and  rhythm Lungs: Regular rate and effort Abdomen: Soft, nontender, nondistended, no abdominal masses Back: No CVA tenderness Extremities: No edema; right shoulder skin graft Neurologic: Grossly intact GU: Foley catheter in place urine clearing tubing with a lot of sediment and old clot.  Laboratory Data:  Results for orders placed or performed during the hospital encounter of 11/15/14 (from the past 24 hour(s))  Basic metabolic panel     Status: Abnormal   Collection Time: 11/17/14  6:10 AM  Result Value Ref Range   Sodium 140 135 - 145 mmol/L   Potassium 3.6 3.5 - 5.1 mmol/L   Chloride 107 101 - 111 mmol/L   CO2 27 22 - 32 mmol/L   Glucose, Bld 113 (H) 65 - 99 mg/dL   BUN 24 (H) 6 - 20 mg/dL   Creatinine, Ser 1.06 0.61 - 1.24 mg/dL   Calcium 8.0 (L) 8.9 - 10.3 mg/dL   GFR calc non Af Amer >60 >60 mL/min   GFR calc Af Amer >60 >60 mL/min   Anion gap 6 5 - 15  CBC     Status: Abnormal   Collection Time: 11/17/14  6:10 AM  Result Value Ref Range   WBC 10.8 (H) 4.0 - 10.5 K/uL   RBC 3.52 (L) 4.22 - 5.81 MIL/uL   Hemoglobin 10.8 (L) 13.0 - 17.0 g/dL   HCT 33.7 (L) 39.0 - 52.0 %   MCV 95.7 78.0 - 100.0 fL   MCH 30.7 26.0 - 34.0 pg   MCHC 32.0 30.0 - 36.0 g/dL   RDW 13.4 11.5 - 15.5 %   Platelets 362 150 - 400 K/uL   Recent Results (from the past 240 hour(s))  Urine culture     Status: None (Preliminary result)   Collection Time: 11/15/14  6:04 PM  Result Value Ref Range Status   Specimen Description URINE, CLEAN CATCH  Final   Special Requests NONE  Final   Culture   Final    >=100,000 COLONIES/mL PROTEUS MIRABILIS Performed at [facility]    Report Status PENDING  Incomplete   Creatinine:  Recent Labs  11/15/14 1722 11/16/14 0618 11/17/14 0610  CREATININE 1.79* 1.63* 1.06   I reivewed CT scan images independently.   Impression/Assessment/plan: Gross hematuria - I suspect this is related to acute kidney injury and possible hemorrhagic cystitis from  infection. He seems to be improving. He will need to follow-up for outpatient cystoscopy and follow-up imaging as below.  Right hydroureteronephrosis-possible passage of matrix stone or other issues such as papillary necrosis. Patient has no flank pain and kidney function is normal. Also his urine output is normal therefore no acute intervention. He will need to follow-up for renal ultrasound in office with Dr. Gaynelle Arabian. Discussed with patient importance of follow-up as we discussed prolonged hydronephrosis could lead to permanent kidney damage.  Carlo Guevarra 11/17/2014, 9:55 PM

## 2014-11-18 ENCOUNTER — Encounter (HOSPITAL_COMMUNITY): Payer: Self-pay | Admitting: Internal Medicine

## 2014-11-18 DIAGNOSIS — N390 Urinary tract infection, site not specified: Secondary | ICD-10-CM | POA: Insufficient documentation

## 2014-11-18 DIAGNOSIS — T8351XA Infection and inflammatory reaction due to indwelling urinary catheter, initial encounter: Principal | ICD-10-CM

## 2014-11-18 DIAGNOSIS — N1330 Unspecified hydronephrosis: Secondary | ICD-10-CM | POA: Diagnosis present

## 2014-11-18 DIAGNOSIS — T83511A Infection and inflammatory reaction due to indwelling urethral catheter, initial encounter: Secondary | ICD-10-CM

## 2014-11-18 LAB — CBC
HEMATOCRIT: 31.1 % — AB (ref 39.0–52.0)
Hemoglobin: 9.8 g/dL — ABNORMAL LOW (ref 13.0–17.0)
MCH: 30.8 pg (ref 26.0–34.0)
MCHC: 31.5 g/dL (ref 30.0–36.0)
MCV: 97.8 fL (ref 78.0–100.0)
Platelets: 339 10*3/uL (ref 150–400)
RBC: 3.18 MIL/uL — ABNORMAL LOW (ref 4.22–5.81)
RDW: 13.5 % (ref 11.5–15.5)
WBC: 9.5 10*3/uL (ref 4.0–10.5)

## 2014-11-18 LAB — BASIC METABOLIC PANEL
Anion gap: 5 (ref 5–15)
BUN: 21 mg/dL — ABNORMAL HIGH (ref 6–20)
CALCIUM: 8.3 mg/dL — AB (ref 8.9–10.3)
CO2: 30 mmol/L (ref 22–32)
Chloride: 104 mmol/L (ref 101–111)
Creatinine, Ser: 1.14 mg/dL (ref 0.61–1.24)
GFR calc Af Amer: >60 mL/min (ref >60)
GFR calc non Af Amer: >60 mL/min (ref >60)
GLUCOSE: 117 mg/dL — AB (ref 65–99)
POTASSIUM: 4 mmol/L (ref 3.5–5.1)
Sodium: 139 mmol/L (ref 135–145)

## 2014-11-18 LAB — MRSA PCR SCREENING: MRSA by PCR: NEGATIVE

## 2014-11-18 MED ORDER — ALBUTEROL SULFATE (2.5 MG/3ML) 0.083% IN NEBU
2.5000 mg | INHALATION_SOLUTION | Freq: Four times a day (QID) | RESPIRATORY_TRACT | Status: DC
  Administered 2014-11-18 – 2014-11-19 (×3): 2.5 mg via RESPIRATORY_TRACT
  Filled 2014-11-18 (×3): qty 3

## 2014-11-18 NOTE — Care Management Important Message (Signed)
Important Message  Patient Details  Name: Dale Shields MRN: [id number] Date of Birth: [birth year]   Medicare Important Message Given:  Yes-second notification given    Sherald Barge, RN 11/18/2014, 9:33 AM

## 2014-11-18 NOTE — Care Management Note (Signed)
Case Management Note  Patient Details  Name: Dale Shields MRN: [id number] Date of Birth: 29-Feb-1951  Expected Discharge Date:   11/18/2014               Expected Discharge Plan:  [place]  In-House Referral:  NA  Discharge planning Services  CM Consult  Post Acute Care Choice:  Resumption of Svcs/PTA Provider Choice offered to:  Patient  DME Arranged:    DME Agency:     HH Arranged:  RN Normal Agency:  [place]  Status of Service:  In process, will continue to follow  Medicare Important Message Given:  Yes-second notification given Date Medicare IM Given:    Medicare IM give by:    Date Additional Medicare IM Given:    Additional Medicare Important Message give by:     If discussed at [place] of Stay Meetings, dates discussed:    Additional Comments: Pt is from home, lives alone and is independent with assistance from CAP aid. Pt is also active with [facility] for RN services. Sheppard Evens, from [place], made aware of pt's admission and will obtain info from chart. Pt plans to return home with resumption of [place] [facility] services. Resumption orders will be needed. [facility] will be notified when pt is discharge. Pt has electric wheelchair and feels he has no DME needs at DC. No further CM needs.  Sherald Barge, RN 11/18/2014, 1:12 PM

## 2014-11-18 NOTE — Plan of Care (Signed)
Problem: Phase I Progression Outcomes Goal: Pain controlled with appropriate interventions Outcome: Completed/Met Date Met:  11/18/14 Pt states any pain is currently tolerable

## 2014-11-18 NOTE — Progress Notes (Signed)
[facility] PROGRESS NOTE  Dale Shields RAQ:[id number] DOB: 1951-01-17 DOA: 11/15/2014 PCP: Leola Brazil, MD  Assessment/Plan: 1. UTI catheter-associated chronic indwelling foley present on admission with gross hematuria.  Remains afebrile hemodynamically stable and non-toxic. Urine culture with >100,000 proteus mirabilis. Continue rocephin day #3.  2. Gross hematuria. Much improved this am.  Reports last catheter change was end of July. On ASA and Brilinta. Started 48 hours prior to admission. Evaluated by urology who opine related to AKI and possible hemorrhagic cystitis from infection. Recommends OP cystoscopy and follow up imaging.  3. Right hydroureternephrosis - per CT. Evaluated by urology who opine possible passage of stone. Patient asymptomatic and urine output normal so recommending no acute intervention and OP renal US.  4. AKI non-oliguric, etiology unclear. Resolved with IV fluid. monitor  5. Generalized weakness. Up to shower with assist today.  6. FTT, poor oral intake, weight loss. Appetite continues to improve. Close to baseline 7. Acute encephalopathy, resolved. Suspect secondary to infection. 8. Syncope prior to admission, patient cannot recall details. Obtain orthostatic vitals 9. Hypotension, resolved. Etiology unclear, likely poor oral intake. 10. Multiple sclerosis, neurogenic bladder with chronic indwelling Foley catheter. 11. Chronic pain, fibromyalgia on high dose Duragesic. Continue home regimen 12. Anemia of chronic disease, Hgb continues to trend down in setting of hematuria and IV fluids. IV fluids discontinued and hematuria almost resolved today. Will recheck in am. See #2.  13. COPD: appears stable at baseline: will provide home nebulizer 14. CAD: no chest pain  Code Status: full Family Communication: none present Disposition Plan: home in am if hg stable   Consultants:  urology  Procedures:  none  Antibiotics:  Rocephin  11/16/14>>  HPI/Subjective: Sitting on side of bed. Reports feeling "much better"   Objective: Filed Vitals:   11/18/14 0639  BP: 126/71  Pulse: 67  Temp: 98 F (36.7 C)  Resp: 18    Intake/Output Summary (Last 24 hours) at 11/18/14 1047 Last data filed at 11/18/14 0640  Gross per 24 hour  Intake 1136.25 ml  Output   4450 ml  Net -3313.75 ml   Filed Weights   11/15/14 1654 11/15/14 2110  Weight: 86.183 kg (190 lb) 83.099 kg (183 lb 3.2 oz)    Exam:   General:  Appears comfortable smiling  Cardiovascular: RRR no MGR No LE edema  Respiratory: normal effort BS slightly distant no wheeze  Abdomen: non-distended non-tender +BS foley with mild tea colored urine. Clearer than yesterday   Musculoskeletal:  No clubbing or cyanosis  Data Reviewed: Basic Metabolic Panel:  Recent Labs Lab 11/15/14 1722 11/16/14 0618 11/17/14 0610 11/18/14 0553  NA 136 135 140 139  K 4.0 3.8 3.6 4.0  CL 97* 98* 107 104  CO2 29 28 27 30   GLUCOSE 111* 124* 113* 117*  BUN 37* 36* 24* 21*  CREATININE 1.79* 1.63* 1.06 1.14  CALCIUM 8.5* 8.1* 8.0* 8.3*   Liver Function Tests:  Recent Labs Lab 11/15/14 1722  AST 33  ALT 49  ALKPHOS 89  BILITOT 0.4  PROT 6.1*  ALBUMIN 2.8*   No results for input(s): LIPASE, AMYLASE in the last 168 hours. No results for input(s): AMMONIA in the last 168 hours. CBC:  Recent Labs Lab 11/15/14 1722 11/16/14 0618 11/16/14 1644 11/16/14 2137 11/17/14 0610 11/18/14 0553  WBC 11.8* 12.7* 10.3 10.6* 10.8* 9.5  NEUTROABS 6.9  --   --   --   --   --   HGB 12.6* 11.5*  10.4* 10.1* 10.8* 9.8*  HCT 40.1 37.0* 32.4* 31.9* 33.7* 31.1*  MCV 96.6 97.6 96.7 96.1 95.7 97.8  PLT 443* 427* 356 349 362 339   Cardiac Enzymes: No results for input(s): CKTOTAL, CKMB, CKMBINDEX, TROPONINI in the last 168 hours. BNP (last 3 results) No results for input(s): BNP in the last 8760 hours.  ProBNP (last 3 results) No results for input(s): PROBNP in the last 8760  hours.  CBG: No results for input(s): GLUCAP in the last 168 hours.  Recent Results (from the past 240 hour(s))  Urine culture     Status: None (Preliminary result)   Collection Time: 11/15/14  6:04 PM  Result Value Ref Range Status   Specimen Description URINE, CLEAN CATCH  Final   Special Requests NONE  Final   Culture >=100,000 COLONIES/mL PROTEUS MIRABILIS  Final   Report Status PENDING  Incomplete   Organism ID, Bacteria PROTEUS MIRABILIS  Final      Susceptibility   Proteus mirabilis - MIC*    AMPICILLIN <=2 SENSITIVE Sensitive     CEFAZOLIN <=4 SENSITIVE Sensitive     CEFTRIAXONE <=1 SENSITIVE Sensitive     CIPROFLOXACIN <=0.25 SENSITIVE Sensitive     GENTAMICIN <=1 SENSITIVE Sensitive     IMIPENEM 4 SENSITIVE Sensitive     NITROFURANTOIN 128 RESISTANT Resistant     TRIMETH/SULFA <=20 SENSITIVE Sensitive     AMPICILLIN/SULBACTAM <=2 SENSITIVE Sensitive     PIP/TAZO Value in next row Sensitive      <=4 SENSITIVEPerformed at [facility]    * >=100,000 COLONIES/mL PROTEUS MIRABILIS     Studies: Ct Abdomen Pelvis Wo Contrast  11/17/2014   CLINICAL DATA:  Hematuria  EXAM: CT ABDOMEN AND PELVIS WITHOUT CONTRAST  TECHNIQUE: Multidetector CT imaging of the abdomen and pelvis was performed following the standard protocol without IV contrast.  COMPARISON:  09/29/2010  FINDINGS: Lower chest: No pleural effusion identified. The lung bases are clear.  Hepatobiliary: No suspicious liver abnormality. The gallbladder is normal. No biliary dilatation.  Pancreas: Normal appearance of the pancreas.  Spleen: The spleen is unremarkable.  Adrenals/Urinary Tract: The adrenal glands are both normal. There is right-sided hydronephrosis and hydroureter to the level of the UVJ. No obstructing stone or mass identified. Again noted is left sided extra renal pelvis. There is mild left hydroureter identified proximally. The urinary bladder appears collapsed around a Foley catheter balloon.   Stomach/Bowel: The stomach is within normal limits. The small bowel loops have a normal course and caliber. No obstruction. Normal appearance of the colon.  Vascular/Lymphatic: Calcified atherosclerotic disease involves the abdominal aorta. No aneurysm. No enlarged retroperitoneal or mesenteric adenopathy. No enlarged pelvic or inguinal lymph nodes.  Reproductive: Prostate gland and seminal vesicles are unremarkable.  Other: There is no ascites or focal fluid collections within the abdomen or pelvis.  Musculoskeletal: There are no aggressive lytic or sclerotic bone lesions identified.  IMPRESSION: 1. Right-sided hydronephrosis and hydroureter. No obstructing stone or mass noted. Additionally, there is left-sided hydroureter without evidence for obstructing stone or mass. Findings may be the sequelae of bladder outlet obstruction. However, in this patient who as hematuria further investigation with hematuria protocol CT of the abdomen pelvis is advised. 2. Aortic atherosclerosis.   Electronically Signed   By: Kerby Moors M.D.   On: 11/17/2014 15:26    Scheduled Meds: . albuterol  2.5 mg Nebulization Q6H  . aspirin  81 mg Oral Daily  . atorvastatin  10 mg Oral q1800  .  buPROPion  150 mg Oral BID  . calcium carbonate  1 tablet Oral TID WC  . carvedilol  6.25 mg Oral BID WC  . cefTRIAXone (ROCEPHIN)  IV  1 g Intravenous Q24H  . DULoxetine  60 mg Oral BID  . feeding supplement (ENSURE ENLIVE)  237 mL Oral BID BM  . fentaNYL  175 mcg Transdermal Q48H  . modafinil  200 mg Oral Daily  . mometasone-formoterol  2 puff Inhalation BID  . pantoprazole  40 mg Oral Daily  . ticagrelor  90 mg Oral BID   Continuous Infusions:   Principal Problem:   Urinary tract infection Active Problems:   Multiple sclerosis   CAD (coronary artery disease)   AKI (acute kidney injury)   Hematuria   Generalized weakness   FTT (failure to thrive) in adult   Acute encephalopathy   Anemia of chronic disease    Malnutrition of moderate degree   Hydronephrosis    Time spent: 35 minutes    [facility] Pager [phone]. If 7PM-7AM, please contact night-coverage at [email or web address], password [facility] 11/18/2014, 10:47 AM  LOS: 3 days

## 2014-11-19 DIAGNOSIS — N179 Acute kidney failure, unspecified: Secondary | ICD-10-CM

## 2014-11-19 DIAGNOSIS — D638 Anemia in other chronic diseases classified elsewhere: Secondary | ICD-10-CM

## 2014-11-19 DIAGNOSIS — N390 Urinary tract infection, site not specified: Secondary | ICD-10-CM

## 2014-11-19 DIAGNOSIS — R319 Hematuria, unspecified: Secondary | ICD-10-CM

## 2014-11-19 DIAGNOSIS — G9340 Encephalopathy, unspecified: Secondary | ICD-10-CM

## 2014-11-19 LAB — BASIC METABOLIC PANEL
ANION GAP: 11 (ref 5–15)
BUN: 24 mg/dL — ABNORMAL HIGH (ref 6–20)
CO2: 29 mmol/L (ref 22–32)
CREATININE: 1.06 mg/dL (ref 0.61–1.24)
Calcium: 8.8 mg/dL — ABNORMAL LOW (ref 8.9–10.3)
Chloride: 100 mmol/L — ABNORMAL LOW (ref 101–111)
Glucose, Bld: 135 mg/dL — ABNORMAL HIGH (ref 65–99)
Potassium: 4.4 mmol/L (ref 3.5–5.1)
Sodium: 140 mmol/L (ref 135–145)

## 2014-11-19 LAB — CBC
HCT: 32.6 % — ABNORMAL LOW (ref 39.0–52.0)
Hemoglobin: 10.3 g/dL — ABNORMAL LOW (ref 13.0–17.0)
MCH: 30.7 pg (ref 26.0–34.0)
MCHC: 31.6 g/dL (ref 30.0–36.0)
MCV: 97 fL (ref 78.0–100.0)
PLATELETS: 358 10*3/uL (ref 150–400)
RBC: 3.36 MIL/uL — ABNORMAL LOW (ref 4.22–5.81)
RDW: 13.5 % (ref 11.5–15.5)
WBC: 10.3 10*3/uL (ref 4.0–10.5)

## 2014-11-19 MED ORDER — CIPROFLOXACIN HCL 500 MG PO TABS: 500.0000 mg | ORAL_TABLET | Freq: Two times a day (BID) | ORAL | Status: DC

## 2014-11-19 MED ORDER — FENTANYL 75 MCG/HR TD PT72
175.0000 ug | MEDICATED_PATCH | TRANSDERMAL | Status: DC
  Administered 2014-11-19: 175 ug via TRANSDERMAL
  Filled 2014-11-19 (×2): qty 1

## 2014-11-19 NOTE — Care Management Note (Signed)
Case Management Note  Patient Details  Name: Dale Shields MRN: [id number] Date of Birth: 17-Jul-1950   Expected Discharge Date:    11/19/2014              Expected Discharge Plan:  [place]  In-House Referral:  NA  Discharge planning Services  CM Consult  Post Acute Care Choice:  Resumption of Svcs/PTA Provider Choice offered to:  Patient  DME Arranged:    DME Agency:     HH Arranged:  RN [place] Agency:  [place]  Status of Service:  Completed, signed off  Medicare Important Message Given:  Yes-third notification given Date Medicare IM Given:    Medicare IM give by:    Date Additional Medicare IM Given:    Additional Medicare Important Message give by:     If discussed at [address] of Stay Meetings, dates discussed:    Additional Comments: Pt is discharging home today with resumption of HH services through [employer]. Sheppard Evens of [facility], made aware and will obtain pt info from chart. Pt made aware HH has 48 hours to resume services. No further CM needs.  Sherald Barge, RN 11/19/2014, 11:40 AM

## 2014-11-19 NOTE — Discharge Summary (Signed)
Physician Discharge Summary  Dale Shields PPJ:[id number] DOB: 1950/12/15 DOA: 11/15/2014  PCP: Leola Brazil, MD  Admit date: 11/15/2014 Discharge date: 11/19/2014  Time spent: 40 minutes  Recommendations for Outpatient Follow-up:  1. Follow-up with Dr. Gaynelle Arabian in one week for evaluation of hematuria 2. Home health nursing   Discharge Diagnoses:  Principal Problem:   Urinary tract infection Active Problems:   Multiple sclerosis   CAD (coronary artery disease)   AKI (acute kidney injury)   Hematuria   Generalized weakness   FTT (failure to thrive) in adult   Acute encephalopathy   Anemia of chronic disease   Malnutrition of moderate degree   Hydronephrosis   Catheter-associated urinary tract infection   Discharge Condition: Stable Diet recommendation: Heart healthy  Filed Weights   11/15/14 1654 11/15/14 2110  Weight: 86.183 kg (190 lb) 83.099 kg (183 lb 3.2 oz)    History of present illness:  History of multiple myeloma, narcolepsy, GERD, neurogenic bladder with chronic indwelling Foley catheter, COPD, coronary artery disease with 3 stents, hyperlipidemia, chronic pain on chronic opiates. Patient presented emergency department on 11/15/14 with 2 weeks of fatigue that had been constant and not increasing and not improving. Fatigue  worse with  activity. He had not been able to stand and move around as he normally does. No provoking or palliating factors. Patient presented to the emergency department where he was found to have a urinary tract infection. He admited to darker urine over the previous few days. Apparently had a syncopal episode and was more confused.  Hospital Course:  1. UTI catheter-associated chronic indwelling foley present on admission with gross hematuria. Remained afebrile hemodynamically stable and non-toxic. Urine culture with >100,000 proteus mirabilis. recived rocephin for 3 days. Will be discharged 7 days cipro as is sensitive for total 10  days. Follow up with Dr Gaynelle Arabian primary urologist for evaluation of hematuria.   2. Gross hematuria. Resolved at discharge. Reported last catheter change was end of July. On ASA and Brilinta. Started 48 hours prior to admission. Evaluated by urology who opined related to AKI and possible hemorrhagic cystitis from infection. Recommends OP cystoscopy and follow up imaging. She above 3. Right hydroureternephrosis - per CT. Evaluated by urology who opine possible passage of stone. Patient asymptomatic and urine output normal so recommending no acute intervention and OP renal US.  4. AKI non-oliguric, etiology unclear. Resolved with IV fluid.  5. Generalized weakness. Up to shower with assist on day of discharge 6. FTT, poor oral intake, weight loss. Appetite improved at discharge. Close to baseline 7. Acute encephalopathy, resolved. Suspect secondary to infection. 8. Hypotension, resolved. Etiology unclear, likely poor oral intake. 9. Multiple sclerosis, neurogenic bladder with chronic indwelling Foley catheter. 10. Chronic pain, fibromyalgia on high dose Duragesic. Continued home regimen 11. Anemia of chronic disease, Hgb stable at discharge. Recommend OP follow up fort trending  12. COPD: remained stable at baseliner 91. CAD: no chest pain  Procedures:  none  Consultations:  urology  Discharge Exam: Filed Vitals:   11/19/14 0500  BP: 134/76  Pulse: 71  Temp: 97.7 F (36.5 C)  Resp: 16    General: well nourished appears comfortable Cardiovascular: RRR no LE edema Respiratory: normal effort BS clear bilaterally no crackles  Discharge Instructions   Discharge Instructions    Diet - low sodium heart healthy    Complete by:  As directed      Discharge instructions    Complete by:  As directed  Take medications as directed.  Follow up with Dr Gaynelle Arabian 1 week     Increase activity slowly    Complete by:  As directed           Current Discharge Medication List     START taking these medications   Details  ciprofloxacin (CIPRO) 500 MG tablet Take 1 tablet (500 mg total) by mouth 2 (two) times daily. Qty: 14 tablet, Refills: 0      CONTINUE these medications which have NOT CHANGED   Details  acetaminophen (TYLENOL) 500 MG tablet Take 500 mg by mouth every 4 (four) hours as needed for mild pain, fever or headache.    albuterol (PROVENTIL) (2.5 MG/3ML) 0.083% nebulizer solution Take 2.5 mg by nebulization every 6 (six) hours as needed for wheezing or shortness of breath.    ALPRAZolam (XANAX) 0.25 MG tablet Take 0.25 mg by mouth 2 (two) times daily as needed for anxiety.    APPLE CIDER VINEGAR PO Take 1 tablet by mouth daily.    Armodafinil (NUVIGIL) 250 MG tablet Take 250 mg by mouth every morning.     aspirin 81 MG chewable tablet Chew 81 mg by mouth daily.     atorvastatin (LIPITOR) 10 MG tablet Take 10 mg by mouth daily.    b complex vitamins tablet Take 1 tablet by mouth daily.    buPROPion (WELLBUTRIN SR) 150 MG 12 hr tablet Take 150 mg by mouth 2 (two) times daily.    calcium acetate (PHOSLO) 667 MG capsule Take 667 mg by mouth daily as needed (for indigestion).    carvedilol (COREG) 6.25 MG tablet Take 6.25 mg by mouth 2 (two) times daily.     Cholecalciferol (VITAMIN D PO) Take 1 tablet by mouth daily.    clonazePAM (KLONOPIN) 0.5 MG tablet Take 0.5 mg by mouth every 12 (twelve) hours as needed for anxiety.    DULoxetine (CYMBALTA) 60 MG capsule Take 1 capsule (60 mg total) by mouth 2 (two) times daily. Qty: 60 capsule, Refills: 1    fentaNYL (DURAGESIC - DOSED MCG/HR) 75 MCG/HR Place 175 mcg onto the skin every 3 (three) days. Applies 133mg and 746m for a total of 17537mevery 72H    Fluticasone-Salmeterol (ADVAIR) 250-50 MCG/DOSE AEPB Inhale 1 puff into the lungs 2 (two) times daily.    levalbuterol (XOPENEX HFA) 45 MCG/ACT inhaler Inhale 2 puffs into the lungs every 4 (four) hours as needed for wheezing.    loperamide  (IMODIUM) 2 MG capsule Take 2 mg by mouth as needed for diarrhea or loose stools.     Misc Natural Products (RED WINE COMPLEX PO) Take 1 capsule by mouth daily.    Multiple Vitamins-Minerals (THEREMS-M) TABS Take 1 tablet by mouth daily.    nitroGLYCERIN (NITROSTAT) 0.3 MG SL tablet Place 0.4 mg under the tongue every 5 (five) minutes as needed for chest pain.    ticagrelor (BRILINTA) 90 MG TABS tablet Take 90 mg by mouth 2 (two) times daily.    VITAMIN A PO Take 1 tablet by mouth daily.    vitamin C (ASCORBIC ACID) 500 MG tablet Take 1,000 mg by mouth 2 (two) times daily.     VITAMIN E PO Take 1 tablet by mouth daily.    mupirocin ointment (BACTROBAN) 2 % Apply 1 application topically daily as needed.      STOP taking these medications     fentaNYL (DURAGESIC - DOSED MCG/HR) 100 MCG/HR        Allergies  Allergen Reactions  . Ultram [Tramadol Hcl] Other (See Comments)    Seizures; "grand mal; twice"   Follow-up Information    Follow up with Ailene Rud, MD On 11/27/2014.   Specialty:  Urology   Why:   evaluation of hematuri  at 1:00 pm   Contact information:   [place] [address] [phone]       Follow up with [facility].   Specialty:  Home Health Services   Contact information:   [place] [address] [phone]        The results of significant diagnostics from this hospitalization (including imaging, microbiology, ancillary and laboratory) are listed below for reference.    Significant Diagnostic Studies: Ct Abdomen Pelvis Wo Contrast  11/17/2014   CLINICAL DATA:  Hematuria  EXAM: CT ABDOMEN AND PELVIS WITHOUT CONTRAST  TECHNIQUE: Multidetector CT imaging of the abdomen and pelvis was performed following the standard protocol without IV contrast.  COMPARISON:  09/29/2010  FINDINGS: Lower chest: No pleural effusion identified. The lung bases are clear.  Hepatobiliary: No suspicious liver abnormality. The  gallbladder is normal. No biliary dilatation.  Pancreas: Normal appearance of the pancreas.  Spleen: The spleen is unremarkable.  Adrenals/Urinary Tract: The adrenal glands are both normal. There is right-sided hydronephrosis and hydroureter to the level of the UVJ. No obstructing stone or mass identified. Again noted is left sided extra renal pelvis. There is mild left hydroureter identified proximally. The urinary bladder appears collapsed around a Foley catheter balloon.  Stomach/Bowel: The stomach is within normal limits. The small bowel loops have a normal course and caliber. No obstruction. Normal appearance of the colon.  Vascular/Lymphatic: Calcified atherosclerotic disease involves the abdominal aorta. No aneurysm. No enlarged retroperitoneal or mesenteric adenopathy. No enlarged pelvic or inguinal lymph nodes.  Reproductive: Prostate gland and seminal vesicles are unremarkable.  Other: There is no ascites or focal fluid collections within the abdomen or pelvis.  Musculoskeletal: There are no aggressive lytic or sclerotic bone lesions identified.  IMPRESSION: 1. Right-sided hydronephrosis and hydroureter. No obstructing stone or mass noted. Additionally, there is left-sided hydroureter without evidence for obstructing stone or mass. Findings may be the sequelae of bladder outlet obstruction. However, in this patient who as hematuria further investigation with hematuria protocol CT of the abdomen pelvis is advised. 2. Aortic atherosclerosis.   Electronically Signed   By: Kerby Moors M.D.   On: 11/17/2014 15:26   Dg Chest 2 View  11/15/2014   CLINICAL DATA:  Acute onset of generalized weakness and shortness of breath. Initial encounter.  EXAM: CHEST  2 VIEW  COMPARISON:  Chest radiograph from 05/25/2013  FINDINGS: The lungs are well-aerated. Pulmonary vascularity is at the upper limits of normal. There is no evidence of focal opacification, pleural effusion or pneumothorax. Mild biapical scarring is  noted.  The heart is normal in size; the mediastinal contour is within normal limits. No acute osseous abnormalities are seen.  IMPRESSION: No acute cardiopulmonary process seen.   Electronically Signed   By: Garald Balding M.D.   On: 11/15/2014 19:31    Microbiology: Recent Results (from the past 240 hour(s))  Urine culture     Status: None (Preliminary result)   Collection Time: 11/15/14  6:04 PM  Result Value Ref Range Status   Specimen Description URINE, CLEAN CATCH  Final   Special Requests NONE  Final   Culture >=100,000 COLONIES/mL PROTEUS MIRABILIS  Final   Report Status PENDING  Incomplete   Organism ID, Bacteria PROTEUS MIRABILIS  Final      Susceptibility   Proteus mirabilis - MIC*    AMPICILLIN <=2 SENSITIVE Sensitive     CEFAZOLIN <=4 SENSITIVE Sensitive     CEFTRIAXONE <=1 SENSITIVE Sensitive     CIPROFLOXACIN <=0.25 SENSITIVE Sensitive     GENTAMICIN <=1 SENSITIVE Sensitive     IMIPENEM 4 SENSITIVE Sensitive     NITROFURANTOIN 128 RESISTANT Resistant     TRIMETH/SULFA <=20 SENSITIVE Sensitive     AMPICILLIN/SULBACTAM <=2 SENSITIVE Sensitive     PIP/TAZO Value in next row Sensitive      <=4 SENSITIVEPerformed at [facility]    * >=100,000 COLONIES/mL PROTEUS MIRABILIS  MRSA PCR Screening     Status: None   Collection Time: 11/18/14  3:36 PM  Result Value Ref Range Status   MRSA by PCR NEGATIVE NEGATIVE Final    Comment:        The GeneXpert MRSA Assay (FDA approved for NASAL specimens only), is one component of a comprehensive MRSA colonization surveillance program. It is not intended to diagnose MRSA infection nor to guide or monitor treatment for MRSA infections.      Labs: Basic Metabolic Panel:  Recent Labs Lab 11/15/14 1722 11/16/14 0618 11/17/14 0610 11/18/14 0553 11/19/14 0553  NA 136 135 140 139 140  K 4.0 3.8 3.6 4.0 4.4  CL 97* 98* 107 104 100*  CO2 '29 28 27 30 29  ' GLUCOSE 111* 124* 113* 117* 135*  BUN 37* 36* 24* 21* 24*   CREATININE 1.79* 1.63* 1.06 1.14 1.06  CALCIUM 8.5* 8.1* 8.0* 8.3* 8.8*   Liver Function Tests:  Recent Labs Lab 11/15/14 1722  AST 33  ALT 49  ALKPHOS 89  BILITOT 0.4  PROT 6.1*  ALBUMIN 2.8*   No results for input(s): LIPASE, AMYLASE in the last 168 hours. No results for input(s): AMMONIA in the last 168 hours. CBC:  Recent Labs Lab 11/15/14 1722  11/16/14 1644 11/16/14 2137 11/17/14 0610 11/18/14 0553 11/19/14 0553  WBC 11.8*  < > 10.3 10.6* 10.8* 9.5 10.3  NEUTROABS 6.9  --   --   --   --   --   --   HGB 12.6*  < > 10.4* 10.1* 10.8* 9.8* 10.3*  HCT 40.1  < > 32.4* 31.9* 33.7* 31.1* 32.6*  MCV 96.6  < > 96.7 96.1 95.7 97.8 97.0  PLT 443*  < > 356 349 362 339 358  < > = values in this interval not displayed. Cardiac Enzymes: No results for input(s): CKTOTAL, CKMB, CKMBINDEX, TROPONINI in the last 168 hours. BNP: BNP (last 3 results) No results for input(s): BNP in the last 8760 hours.  ProBNP (last 3 results) No results for input(s): PROBNP in the last 8760 hours.  CBG: No results for input(s): GLUCAP in the last 168 hours.     SignedRadene Gunning  [facility] 11/19/2014, 12:02 PM

## 2014-11-19 NOTE — Care Management Important Message (Signed)
Important Message  Patient Details  Name: JAQUE DACY MRN: [id number] Date of Birth: 10/21/1950   Medicare Important Message Given:  Yes-third notification given    Sherald Barge, RN 11/19/2014, 11:39 AM

## 2014-11-20 LAB — URINE CULTURE

## 2014-12-19 ENCOUNTER — Ambulatory Visit: Payer: Medicare Other | Admitting: Family Medicine

## 2015-01-13 ENCOUNTER — Encounter: Payer: Self-pay | Admitting: Family Medicine

## 2015-01-13 ENCOUNTER — Ambulatory Visit (INDEPENDENT_AMBULATORY_CARE_PROVIDER_SITE_OTHER): Payer: Medicare Other | Admitting: Family Medicine

## 2015-01-13 VITALS — BP 144/88 | HR 69 | Temp 97.1°F | Ht 70.0 in | Wt 193.2 lb

## 2015-01-13 DIAGNOSIS — Z23 Encounter for immunization: Secondary | ICD-10-CM

## 2015-01-13 DIAGNOSIS — G894 Chronic pain syndrome: Secondary | ICD-10-CM

## 2015-01-13 NOTE — Progress Notes (Signed)
   HPI  Patient presents today here to establish care. He was previously seen by Dr. Katherine Roan in [facility] and is changing   He has no complaints today.He needs refills on fnetanyl and klonopin.   He states that he has chronic joint pains and new onset, the last 6 weeks, sharp momentary pains he attributes to MS. He uses fentanyl patches, two 75 g patches  He states that he has been on benzodiazepines for about 1 year and is wenaing off of xanax and starting klonopin.  He takes nuvigil for narcolepsy and MS related low energy. He states a sleep physician in [place] diagnosed narcolepsy.   He is agreeable to seeing a pain clinic but does not want to go to the [place] pain clinic.   He is breathing easily currently and compliant with advair. He has quit smoking.   He is able to ambulate short distances but uses his motorized wheelchair for longer distances.   PMH: Smoking status noted Past medical, social, surgical, and family history reviewed and updated in EMR ROS: Per HPI  Objective: BP 144/88 mmHg  Pulse 69  Temp(Src) 97.1 F (36.2 C) (Oral)  Ht 5\' 10"  (1.778 m)  Wt 193 lb 3.2 oz (87.635 kg)  BMI 27.72 kg/m2 Gen: NAD, alert, cooperative with exam, in motorized wheelchair HEENT: NCAT, TMs WNL BL, Nares clear, oropharynx clear.  CV: RRR, good S1/S2, no murmur Resp: CTABL, no wheezes, non-labored Abd: SNTND, BS present, no guarding or organomegaly Ext: No edema, warm Neuro: Alert and oriented  Assessment and plan:  # Chronic pain synfrome I explained that I will not fill chronic pain meds Refer to pain management, recommend his previous PCP cover in teh meantime, he has 2 more weeks supply  # Anxiety Complex regimen with max dose cymbalta, wellbutrin, xanax and klonopin Tapering off of xanax, states he would like to taper off of klonopin Discussed taper  # narcolepsy, decreased energy On stimulant, nuvigil I asked him to get sleep records sent to  me Considering how many controlled substances he is on I will npot fill this today If I can see the eval of his narcolepsy it is reasonable to continue this but I would not recommend being both a stimulant and a benzodiazepine.   # COPD On Advair with 2-3 episodes of dyspnea requiring albuterol per week.  Good control Congratulated on smoking cessation Continue advair and albuterol at current dosing    Meds ordered this encounter  Medications  . Fish Oil-Cholecalciferol (FISH OIL + D3 PO)    Sig: Take by mouth.  . Flaxseed, Linseed, (FLAXSEED OIL) 1000 MG CAPS    Sig: Take by mouth.  . Chromium 200 MCG CAPS    Sig: Take by mouth.  . Spirulina 500 MG TABS    Sig: Take by mouth.    Laroy Apple, MD [facility] 01/13/2015, 2:18 PM

## 2015-01-13 NOTE — Patient Instructions (Signed)
Great to meet you!  We will work on a pain clinic referral, please have Dr. Loyal Buba office send records.   You may stop xanax as long as you are still taking klonopin, you can decrease it by half for the next week then 1/2 of that for 1 week then take that same dose every other day for 1 week then you can stop safely.   I will not be able to prescibe your controlled substances, if you require daily benzodiazepines we can set you up with psychiatry  Please have your sleep evaluation sent to [name] as well.

## 2015-01-15 ENCOUNTER — Telehealth: Payer: Self-pay

## 2015-02-05 NOTE — Telephone Encounter (Signed)
Noted in chart.

## 2015-02-18 ENCOUNTER — Ambulatory Visit: Payer: Medicare Other | Admitting: Family Medicine

## 2015-02-18 ENCOUNTER — Ambulatory Visit (INDEPENDENT_AMBULATORY_CARE_PROVIDER_SITE_OTHER): Payer: Medicare Other | Admitting: Family Medicine

## 2015-02-18 ENCOUNTER — Encounter: Payer: Self-pay | Admitting: Family Medicine

## 2015-02-18 VITALS — BP 149/84 | HR 77 | Temp 97.5°F | Ht 70.0 in | Wt 193.0 lb

## 2015-02-18 DIAGNOSIS — G35 Multiple sclerosis: Secondary | ICD-10-CM

## 2015-02-18 DIAGNOSIS — R319 Hematuria, unspecified: Secondary | ICD-10-CM | POA: Diagnosis not present

## 2015-02-18 DIAGNOSIS — J449 Chronic obstructive pulmonary disease, unspecified: Secondary | ICD-10-CM

## 2015-02-18 MED ORDER — DULOXETINE HCL 60 MG PO CPEP: 60.0000 mg | ORAL_CAPSULE | Freq: Two times a day (BID) | ORAL | Status: DC

## 2015-02-18 MED ORDER — ARMODAFINIL 250 MG PO TABS: 250.0000 mg | ORAL_TABLET | Freq: Every morning | ORAL | Status: DC

## 2015-02-18 MED ORDER — CLONAZEPAM 0.5 MG PO TABS: ORAL_TABLET | ORAL | Status: DC

## 2015-02-18 NOTE — Progress Notes (Signed)
Subjective:  Patient ID: Dale Shields, male    DOB: Jul 28, 1950  Age: 64 y.o. MRN: [id number]  CC: Establish Care   HPI OMARRION CARMER presents for follow-up in the office for his multiple sclerosis. He has multiple diagnoses as noted in his history below. Currently he is needing prescriptions for his Nuvigil. He states that he doesn't actually have narcolepsy. He just has to have it as a stimulant to offset the fatigue and drowsiness caused by the multiple sclerosis. He is willing to try to taper that after discussion of its tendency to exacerbate anxiety and the unclear indication for the drug as well as the potential interaction between that and his other medications in his treatment regimen. He is currently in a wheelchair. He says that he usually stays in it but he can get up to move around for transfers etc. He had been in to see Dr. Wendi Snipes and been upset that he could not get his opiates including fentanyl, but he was referred to pain management so he has decided to return to the practice after all. He is no longer taking Xanax but he would like to have the Klonopin renewed. This is helpful for muscle spasm as well as for some of the anxiety and nervousness that seems to go with his MS. Although he has a history of COPD, he denies shortness of breath. He confirms that he is not smoking now for 6 months. He has smoked off and on several times and it's difficult to ascertain a pack year history however after discussion it seems that about 20 pack years would be appropriate. In spite of his history of thrombocytopenia and his use of Brilinta to thin his blood he denies any easy bruising or bleeding. He has a history of MI but denies any current chest pain. Of note is that he had angioplasty with stent 3 years ago. Currently he has nitroglycerin available but has not used it recently. However he does take multiple pain medicines and it's doubtful that he would feel mild angina through that  therefore he is maintained on carvedilol for antianginal effect.  History Tomio has a past medical history of Fibromyalgia; Multiple sclerosis ([place]); Narcolepsy; Scoliosis; Hypothyroidism; HLD (hyperlipidemia); Anemia of chronic disease; Thrombocytopenia ([place]); Pulmonary embolism ([facility]); COPD (chronic obstructive pulmonary disease) ([place]); Insomnia; Carpal tunnel syndrome; GERD (gastroesophageal reflux disease); BPH (benign prostatic hypertrophy); History of gout; Neurogenic bladder; Optic neuritis; History of epistaxis; History of MRSA infection (2012); Endocarditis (2012); Supraspinatus tendon tear (2012); Heart murmur; Myocardial infarction ([place]); Exertional dyspnea; OSA (obstructive sleep apnea); History of blood transfusion; H/O hiatal hernia; Osteoarthritis of multiple joints; DDD (degenerative disc disease); CAD (coronary artery disease) (11/2011); Anxiety and depression; Steroid-induced psychosis; History of renal failure (2011); Hydronephrosis; Anxiety; Depression; Hypertension; and Necrotizing fasciitis ([place]).   He has past surgical history that includes Carpal tunnel release (~ 2010); nasal cautery; Skin graft (04/2009); Colonoscopy (2000; 2005; 03/15/2011); Coronary angioplasty (11/22/2011); Coronary angioplasty with stent (11/22/2011); Knee arthroscopy w/ meniscal repair (9150'C); left heart catheterization with coronary angiogram (N/A, 11/22/2011); and percutaneous coronary stent intervention (pci-s) (11/22/2011).   His family history includes Alzheimer's disease in his mother. There is no history of Colon cancer.He reports that he quit smoking about 6 months ago. He has never used smokeless tobacco. He reports that he does not drink alcohol or use illicit drugs.  Outpatient Prescriptions Prior to Visit  Medication Sig Dispense Refill  . albuterol (PROVENTIL) (2.5 MG/3ML) 0.083% nebulizer solution Take 2.5 mg  by nebulization every 6 (six) hours as needed for wheezing or shortness of breath.    .  APPLE CIDER VINEGAR PO Take 1 tablet by mouth daily.    Marland Kitchen aspirin 81 MG chewable tablet Chew 81 mg by mouth daily.     Marland Kitchen atorvastatin (LIPITOR) 10 MG tablet Take 10 mg by mouth daily.    Marland Kitchen b complex vitamins tablet Take 1 tablet by mouth daily.    Marland Kitchen buPROPion (WELLBUTRIN SR) 150 MG 12 hr tablet Take 150 mg by mouth 2 (two) times daily.    . carvedilol (COREG) 6.25 MG tablet Take 6.25 mg by mouth 2 (two) times daily.     . Cholecalciferol (VITAMIN D PO) Take 1 tablet by mouth daily.    . Chromium 200 MCG CAPS Take by mouth.    . fentaNYL (DURAGESIC - DOSED MCG/HR) 75 MCG/HR Place 175 mcg onto the skin every 3 (three) days. Applies 154mcg and 95mcg for a total of 123mcg every 72H    . Fish Oil-Cholecalciferol (FISH OIL + D3 PO) Take by mouth.    . Flaxseed, Linseed, (FLAXSEED OIL) 1000 MG CAPS Take by mouth.    . Fluticasone-Salmeterol (ADVAIR) 250-50 MCG/DOSE AEPB Inhale 1 puff into the lungs 2 (two) times daily.    Marland Kitchen levalbuterol (XOPENEX HFA) 45 MCG/ACT inhaler Inhale 2 puffs into the lungs every 4 (four) hours as needed for wheezing.    . Misc Natural Products (RED WINE COMPLEX PO) Take 1 capsule by mouth daily.    . Multiple Vitamins-Minerals (THEREMS-M) TABS Take 1 tablet by mouth daily.    . nitroGLYCERIN (NITROSTAT) 0.3 MG SL tablet Place 0.4 mg under the tongue every 5 (five) minutes as needed for chest pain.    Marland Kitchen VITAMIN A PO Take 1 tablet by mouth daily.    . vitamin C (ASCORBIC ACID) 500 MG tablet Take 1,000 mg by mouth 2 (two) times daily.     Marland Kitchen VITAMIN E PO Take 1 tablet by mouth daily.    . Armodafinil (NUVIGIL) 250 MG tablet Take 250 mg by mouth every morning.     . clonazePAM (KLONOPIN) 0.5 MG tablet Take 0.5 mg by mouth every 12 (twelve) hours as needed for anxiety.    . DULoxetine (CYMBALTA) 60 MG capsule Take 1 capsule (60 mg total) by mouth 2 (two) times daily. 60 capsule 1  . ALPRAZolam (XANAX) 0.25 MG tablet Take 0.25 mg by mouth 2 (two) times daily as needed for anxiety.     Marland Kitchen Spirulina 500 MG TABS Take by mouth.    . ticagrelor (BRILINTA) 90 MG TABS tablet Take 90 mg by mouth 2 (two) times daily.     No facility-administered medications prior to visit.    ROS Review of Systems  Constitutional: Positive for activity change (slowly decreasing due to immobility from MS) and fatigue. Negative for fever, chills, diaphoresis, appetite change and unexpected weight change.  HENT: Negative for congestion, ear pain, hearing loss, postnasal drip, rhinorrhea, sore throat, tinnitus and trouble swallowing.   Eyes: Negative for photophobia, pain, discharge and redness.  Respiratory: Negative for apnea, cough, choking, chest tightness, shortness of breath, wheezing and stridor.   Cardiovascular: Negative for chest pain, palpitations and leg swelling.  Gastrointestinal: Negative for nausea, vomiting, abdominal pain, diarrhea, constipation, blood in stool and abdominal distention.  Endocrine: Negative for cold intolerance, heat intolerance, polydipsia, polyphagia and polyuria.  Genitourinary: Positive for urgency, hematuria and enuresis. Negative for dysuria, frequency, flank pain, difficulty urinating,  penile pain and testicular pain.  Musculoskeletal: Positive for myalgias and arthralgias (frequent, randomly transitory. Fair control with pain regimen). Negative for joint swelling.  Skin: Negative for color change, rash and wound.  Allergic/Immunologic: Negative for immunocompromised state.  Neurological: Positive for weakness and numbness (intermittent, transitory). Negative for dizziness, tremors, seizures, syncope, facial asymmetry, speech difficulty, light-headedness and headaches.  Hematological: Does not bruise/bleed easily.  Psychiatric/Behavioral: Positive for dysphoric mood and decreased concentration. Negative for suicidal ideas, hallucinations, behavioral problems, confusion, sleep disturbance and agitation. The patient is nervous/anxious. The patient is not  hyperactive.     Objective:  BP 149/84 mmHg  Pulse 77  Temp(Src) 97.5 F (36.4 C) (Oral)  Ht 5\' 10"  (1.778 m)  Wt 193 lb (87.544 kg)  BMI 27.69 kg/m2  SpO2 95%  BP Readings from Last 3 Encounters:  02/18/15 149/84  01/13/15 144/88  11/19/14 134/76    Wt Readings from Last 3 Encounters:  02/18/15 193 lb (87.544 kg)  01/13/15 193 lb 3.2 oz (87.635 kg)  11/15/14 183 lb 3.2 oz (83.099 kg)     Physical Exam  Constitutional: He is oriented to person, place, and time. He appears well-nourished. No distress.  Looks stated age. WC bound.  HENT:  Head: Normocephalic and atraumatic.  Right Ear: External ear normal.  Left Ear: External ear normal.  Nose: Nose normal.  Mouth/Throat: Oropharynx is clear and moist.  Eyes: Conjunctivae and EOM are normal. Pupils are equal, round, and reactive to light.  Neck: Normal range of motion. Neck supple. No JVD present. No tracheal deviation present. No thyromegaly present.  Cardiovascular: Normal rate and regular rhythm.   Murmur heard. Pulmonary/Chest: Effort normal. No respiratory distress. He has no wheezes. He has no rales.  Distant breath sounds   Abdominal: Soft. Bowel sounds are normal. He exhibits no distension. There is no tenderness.  Limited exam of pt. In Wheel chair  Genitourinary: Guaiac negative stool. No penile tenderness.  Musculoskeletal: He exhibits tenderness (at hips, knees, neck and along spine). He exhibits no edema.  Lymphadenopathy:    He has no cervical adenopathy.  Neurological: He is alert and oriented to person, place, and time. He has normal reflexes. No cranial nerve deficit. Coordination (some spasticity of extremities) abnormal.  Skin: Skin is warm and dry.  Psychiatric: He has a normal mood and affect. His behavior is normal. Thought content normal.    Lab Results  Component Value Date   HGBA1C  08/29/2009    5.6 (NOTE)                                                                       According  to the ADA Clinical Practice Recommendations for 2011, when HbA1c is used as a screening test:   >=6.5%   Diagnostic of Diabetes Mellitus           (if abnormal result  is confirmed)  5.7-6.4%   Increased risk of developing Diabetes Mellitus  References:Diagnosis and Classification of Diabetes Mellitus,Diabetes ENID,7824,23(NTIRW 1):S62-S69 and Standards of Medical Care in         Diabetes - 2011,Diabetes ERXV,4008,67  (Suppl 1):S11-S61.    Lab Results  Component Value Date   WBC 10.3 11/19/2014   HGB 10.3* 11/19/2014  HCT 32.6* 11/19/2014   PLT 358 11/19/2014   GLUCOSE 135* 11/19/2014   CHOL 111 09/29/2010   TRIG 130 09/29/2010   HDL 17* 09/29/2010   LDLCALC 68 09/29/2010   ALT 49 11/15/2014   AST 33 11/15/2014   NA 140 11/19/2014   K 4.4 11/19/2014   CL 100* 11/19/2014   CREATININE 1.06 11/19/2014   BUN 24* 11/19/2014   CO2 29 11/19/2014   TSH 2.58 04/12/2013   INR 1.10 01/13/2011   HGBA1C  08/29/2009    5.6 (NOTE)                                                                       According to the ADA Clinical Practice Recommendations for 2011, when HbA1c is used as a screening test:   >=6.5%   Diagnostic of Diabetes Mellitus           (if abnormal result  is confirmed)  5.7-6.4%   Increased risk of developing Diabetes Mellitus  References:Diagnosis and Classification of Diabetes Mellitus,Diabetes VCBS,4967,59(FMBWG 1):S62-S69 and Standards of Medical Care in         Diabetes - 2011,Diabetes Care,2011,34  (Suppl 1):S11-S61.    Dg Chest 2 View  11/15/2014  CLINICAL DATA:  Acute onset of generalized weakness and shortness of breath. Initial encounter. EXAM: CHEST  2 VIEW COMPARISON:  Chest radiograph from 05/25/2013 FINDINGS: The lungs are well-aerated. Pulmonary vascularity is at the upper limits of normal. There is no evidence of focal opacification, pleural effusion or pneumothorax. Mild biapical scarring is noted. The heart is normal in size; the mediastinal contour is within  normal limits. No acute osseous abnormalities are seen. IMPRESSION: No acute cardiopulmonary process seen. Electronically Signed   By: Garald Balding M.D.   On: 11/15/2014 19:31    Assessment & Plan:   Celso was seen today for establish care.  Diagnoses and all orders for this visit:  Multiple sclerosis ([place])  Hematuria  Chronic obstructive pulmonary disease, unspecified COPD type ([place])  Other orders -     clonazePAM (KLONOPIN) 0.5 MG tablet; 0.5 tab q am and 1 tab each evening as needed for anxiety -     Discontinue: Armodafinil (NUVIGIL) 250 MG tablet; Take 1 tablet (250 mg total) by mouth every morning. For symptoms of MS -     DULoxetine (CYMBALTA) 60 MG capsule; Take 1 capsule (60 mg total) by mouth 2 (two) times daily.   I have discontinued Mr. Bickham's Armodafinil, ticagrelor, ALPRAZolam, and Spirulina. I have also changed his clonazePAM. Additionally, I am having him maintain his aspirin, nitroGLYCERIN, vitamin C, carvedilol, THEREMS-M, Fluticasone-Salmeterol, b complex vitamins, VITAMIN A PO, Cholecalciferol (VITAMIN D PO), VITAMIN E PO, levalbuterol, albuterol, buPROPion, atorvastatin, fentaNYL, APPLE CIDER VINEGAR PO, Misc Natural Products (RED WINE COMPLEX PO), Fish Oil-Cholecalciferol (FISH OIL + D3 PO), Flaxseed Oil, Chromium, and DULoxetine.  Meds ordered this encounter  Medications  . clonazePAM (KLONOPIN) 0.5 MG tablet    Sig: 0.5 tab q am and 1 tab each evening as needed for anxiety    Dispense:  45 tablet    Refill:  0  . DISCONTD: Armodafinil (NUVIGIL) 250 MG tablet    Sig: Take 1 tablet (250 mg total) by mouth every morning.  For symptoms of MS    Dispense:  30 tablet    Refill:  0  . DULoxetine (CYMBALTA) 60 MG capsule    Sig: Take 1 capsule (60 mg total) by mouth 2 (two) times daily.    Dispense:  60 capsule    Refill:  5   We discussed the risk of respiratory depression from the combination of multiple high-dose narcotics along with benzodiazepines. He  is agreeable to tapering the clonazepam. The first step in the taper was taken today and this is noted above. He has apparently not been taking the Nuvigil recently therefore it is discontinued.  Follow-up: Return in about 1 month (around 03/20/2015) for MS, anxiety and energy.  [name], M.D.

## 2015-02-19 ENCOUNTER — Telehealth: Payer: Self-pay | Admitting: Neurology

## 2015-02-19 ENCOUNTER — Telehealth: Payer: Self-pay | Admitting: Family Medicine

## 2015-02-19 DIAGNOSIS — G47419 Narcolepsy without cataplexy: Secondary | ICD-10-CM

## 2015-02-19 NOTE — Telephone Encounter (Signed)
Please advise 

## 2015-02-19 NOTE — Telephone Encounter (Signed)
Called pt, mailed today

## 2015-02-19 NOTE — Telephone Encounter (Signed)
Pt called and wanted to know if Dr Tomi Likens can call in a prescription Nuvigil/Dawn CB# [phone]

## 2015-02-20 ENCOUNTER — Other Ambulatory Visit: Payer: Self-pay | Admitting: *Deleted

## 2015-02-20 DIAGNOSIS — G35 Multiple sclerosis: Secondary | ICD-10-CM

## 2015-02-20 DIAGNOSIS — G47419 Narcolepsy without cataplexy: Secondary | ICD-10-CM

## 2015-02-20 MED ORDER — ARMODAFINIL 250 MG PO TABS: 250.0000 mg | ORAL_TABLET | Freq: Every morning | ORAL | Status: DC

## 2015-02-20 NOTE — Telephone Encounter (Signed)
Yes

## 2015-02-20 NOTE — Telephone Encounter (Signed)
Rx faxed

## 2015-03-26 ENCOUNTER — Other Ambulatory Visit: Payer: Self-pay | Admitting: Family Medicine

## 2015-03-26 NOTE — Telephone Encounter (Signed)
Last seen 02/18/15  Dr Livia Snellen  If approved route to nurse to call into [place]  [phone]

## 2015-03-30 ENCOUNTER — Other Ambulatory Visit: Payer: Self-pay | Admitting: Family Medicine

## 2015-03-30 DIAGNOSIS — G47419 Narcolepsy without cataplexy: Secondary | ICD-10-CM

## 2015-03-30 MED ORDER — ARMODAFINIL 250 MG PO TABS: 250.0000 mg | ORAL_TABLET | Freq: Every morning | ORAL | Status: DC

## 2015-03-30 NOTE — Telephone Encounter (Signed)
Pt notified of RX  MUST be seen for further refill RX faxed to [facility]

## 2015-03-30 NOTE — Telephone Encounter (Signed)
Covering for PCP, Dr. Livia Snellen  Refilled X 2 weeks,. Needs to be seen for further refill.    Laroy Apple, MD [facility] 03/30/2015, 5:00 PM

## 2015-04-06 ENCOUNTER — Encounter (HOSPITAL_COMMUNITY): Payer: Self-pay | Admitting: Emergency Medicine

## 2015-04-06 ENCOUNTER — Other Ambulatory Visit: Payer: Self-pay | Admitting: Family Medicine

## 2015-04-06 ENCOUNTER — Emergency Department (HOSPITAL_COMMUNITY): Payer: Medicare Other

## 2015-04-06 ENCOUNTER — Observation Stay (HOSPITAL_COMMUNITY)
Admission: EM | Admit: 2015-04-06 | Discharge: 2015-04-08 | Disposition: A | Discharge status: Home / Self Care | Payer: Medicare Other | Attending: Internal Medicine | Admitting: Internal Medicine

## 2015-04-06 DIAGNOSIS — E039 Hypothyroidism, unspecified: Secondary | ICD-10-CM | POA: Insufficient documentation

## 2015-04-06 DIAGNOSIS — Z7982 Long term (current) use of aspirin: Secondary | ICD-10-CM | POA: Insufficient documentation

## 2015-04-06 DIAGNOSIS — I38 Endocarditis, valve unspecified: Secondary | ICD-10-CM | POA: Diagnosis not present

## 2015-04-06 DIAGNOSIS — I209 Angina pectoris, unspecified: Secondary | ICD-10-CM

## 2015-04-06 DIAGNOSIS — R531 Weakness: Secondary | ICD-10-CM

## 2015-04-06 DIAGNOSIS — M726 Necrotizing fasciitis: Secondary | ICD-10-CM | POA: Diagnosis not present

## 2015-04-06 DIAGNOSIS — I10 Essential (primary) hypertension: Secondary | ICD-10-CM | POA: Diagnosis not present

## 2015-04-06 DIAGNOSIS — F329 Major depressive disorder, single episode, unspecified: Secondary | ICD-10-CM | POA: Diagnosis not present

## 2015-04-06 DIAGNOSIS — E785 Hyperlipidemia, unspecified: Secondary | ICD-10-CM | POA: Insufficient documentation

## 2015-04-06 DIAGNOSIS — R011 Cardiac murmur, unspecified: Secondary | ICD-10-CM | POA: Diagnosis not present

## 2015-04-06 DIAGNOSIS — G4733 Obstructive sleep apnea (adult) (pediatric): Secondary | ICD-10-CM | POA: Diagnosis not present

## 2015-04-06 DIAGNOSIS — Z79899 Other long term (current) drug therapy: Secondary | ICD-10-CM | POA: Insufficient documentation

## 2015-04-06 DIAGNOSIS — Z86711 Personal history of pulmonary embolism: Secondary | ICD-10-CM | POA: Insufficient documentation

## 2015-04-06 DIAGNOSIS — M797 Fibromyalgia: Secondary | ICD-10-CM | POA: Insufficient documentation

## 2015-04-06 DIAGNOSIS — R079 Chest pain, unspecified: Principal | ICD-10-CM | POA: Insufficient documentation

## 2015-04-06 DIAGNOSIS — M159 Polyosteoarthritis, unspecified: Secondary | ICD-10-CM | POA: Insufficient documentation

## 2015-04-06 DIAGNOSIS — F419 Anxiety disorder, unspecified: Secondary | ICD-10-CM | POA: Insufficient documentation

## 2015-04-06 DIAGNOSIS — Z87891 Personal history of nicotine dependence: Secondary | ICD-10-CM | POA: Insufficient documentation

## 2015-04-06 DIAGNOSIS — N1330 Unspecified hydronephrosis: Secondary | ICD-10-CM | POA: Insufficient documentation

## 2015-04-06 DIAGNOSIS — Z8614 Personal history of Methicillin resistant Staphylococcus aureus infection: Secondary | ICD-10-CM | POA: Diagnosis not present

## 2015-04-06 DIAGNOSIS — M419 Scoliosis, unspecified: Secondary | ICD-10-CM | POA: Diagnosis not present

## 2015-04-06 DIAGNOSIS — G894 Chronic pain syndrome: Secondary | ICD-10-CM | POA: Diagnosis present

## 2015-04-06 DIAGNOSIS — R0789 Other chest pain: Secondary | ICD-10-CM | POA: Diagnosis present

## 2015-04-06 DIAGNOSIS — G35 Multiple sclerosis: Secondary | ICD-10-CM | POA: Diagnosis not present

## 2015-04-06 DIAGNOSIS — D696 Thrombocytopenia, unspecified: Secondary | ICD-10-CM | POA: Insufficient documentation

## 2015-04-06 DIAGNOSIS — N390 Urinary tract infection, site not specified: Secondary | ICD-10-CM | POA: Diagnosis present

## 2015-04-06 DIAGNOSIS — I2510 Atherosclerotic heart disease of native coronary artery without angina pectoris: Secondary | ICD-10-CM | POA: Insufficient documentation

## 2015-04-06 LAB — CBC WITH DIFFERENTIAL/PLATELET
Basophils Absolute: 0 10*3/uL (ref 0.0–0.1)
Basophils Relative: 0 %
Eosinophils Absolute: 0.1 10*3/uL (ref 0.0–0.7)
Eosinophils Relative: 2 %
HCT: 37.2 % — ABNORMAL LOW (ref 39.0–52.0)
Hemoglobin: 11.9 g/dL — ABNORMAL LOW (ref 13.0–17.0)
Lymphocytes Relative: 18 %
Lymphs Abs: 1.6 10*3/uL (ref 0.7–4.0)
MCH: 30.4 pg (ref 26.0–34.0)
MCHC: 32 g/dL (ref 30.0–36.0)
MCV: 94.9 fL (ref 78.0–100.0)
Monocytes Absolute: 0.8 10*3/uL (ref 0.1–1.0)
Monocytes Relative: 9 %
Neutro Abs: 6.4 10*3/uL (ref 1.7–7.7)
Neutrophils Relative %: 71 %
Platelets: 251 10*3/uL (ref 150–400)
RBC: 3.92 MIL/uL — ABNORMAL LOW (ref 4.22–5.81)
RDW: 13.6 % (ref 11.5–15.5)
WBC: 9 10*3/uL (ref 4.0–10.5)

## 2015-04-06 LAB — URINE MICROSCOPIC-ADD ON

## 2015-04-06 LAB — BASIC METABOLIC PANEL
Anion gap: 8 (ref 5–15)
BUN: 27 mg/dL — ABNORMAL HIGH (ref 6–20)
CO2: 30 mmol/L (ref 22–32)
Calcium: 9.2 mg/dL (ref 8.9–10.3)
Chloride: 99 mmol/L — ABNORMAL LOW (ref 101–111)
Creatinine, Ser: 1.04 mg/dL (ref 0.61–1.24)
GFR calc Af Amer: >60 mL/min (ref >60)
GFR calc non Af Amer: >60 mL/min (ref >60)
Glucose, Bld: 123 mg/dL — ABNORMAL HIGH (ref 65–99)
Potassium: 4.3 mmol/L (ref 3.5–5.1)
Sodium: 137 mmol/L (ref 135–145)

## 2015-04-06 LAB — URINALYSIS, ROUTINE W REFLEX MICROSCOPIC
BILIRUBIN URINE: NEGATIVE
Glucose, UA: NEGATIVE mg/dL
Ketones, ur: NEGATIVE mg/dL
NITRITE: POSITIVE — AB
PH: 5.5 (ref 5.0–8.0)
Protein, ur: NEGATIVE mg/dL
SPECIFIC GRAVITY, URINE: 1.025 (ref 1.005–1.030)

## 2015-04-06 LAB — CK: CK TOTAL: 94 U/L (ref 49–397)

## 2015-04-06 LAB — TROPONIN I
Troponin I: <0.03 ng/mL (ref <0.031)
Troponin I: <0.03 ng/mL (ref <0.031)
Troponin I: <0.03 ng/mL (ref <0.031)

## 2015-04-06 MED ORDER — FENTANYL 75 MCG/HR TD PT72: 175.0000 ug | MEDICATED_PATCH | TRANSDERMAL | Status: DC

## 2015-04-06 MED ORDER — FLAXSEED OIL 1000 MG PO CAPS: 1.0000 | ORAL_CAPSULE | Freq: Every day | ORAL | Status: DC

## 2015-04-06 MED ORDER — ASPIRIN 81 MG PO CHEW
81.0000 mg | CHEWABLE_TABLET | Freq: Every day | ORAL | Status: DC
  Administered 2015-04-07 – 2015-04-08 (×2): 81 mg via ORAL
  Filled 2015-04-06 (×2): qty 1

## 2015-04-06 MED ORDER — ASPIRIN 81 MG PO CHEW
243.0000 mg | CHEWABLE_TABLET | Freq: Once | ORAL | Status: AC
  Administered 2015-04-06: 243 mg via ORAL

## 2015-04-06 MED ORDER — MORPHINE SULFATE (PF) 2 MG/ML IV SOLN
2.0000 mg | INTRAVENOUS | Status: DC | PRN
  Administered 2015-04-07 – 2015-04-08 (×6): 2 mg via INTRAVENOUS
  Filled 2015-04-06 (×7): qty 1

## 2015-04-06 MED ORDER — CLONAZEPAM 0.5 MG PO TABS
0.5000 mg | ORAL_TABLET | Freq: Two times a day (BID) | ORAL | Status: DC | PRN
  Administered 2015-04-07 – 2015-04-08 (×2): 0.5 mg via ORAL
  Filled 2015-04-06 (×2): qty 1

## 2015-04-06 MED ORDER — ARMODAFINIL 250 MG PO TABS: 250.0000 mg | ORAL_TABLET | Freq: Every morning | ORAL | Status: DC

## 2015-04-06 MED ORDER — ONDANSETRON HCL 4 MG/2ML IJ SOLN: 4.0000 mg | Freq: Four times a day (QID) | INTRAMUSCULAR | Status: DC | PRN

## 2015-04-06 MED ORDER — KETOROLAC TROMETHAMINE 30 MG/ML IJ SOLN
15.0000 mg | Freq: Once | INTRAMUSCULAR | Status: AC
  Administered 2015-04-06: 15 mg via INTRAVENOUS
  Filled 2015-04-06: qty 1

## 2015-04-06 MED ORDER — FENTANYL 75 MCG/HR TD PT72
175.0000 ug | MEDICATED_PATCH | TRANSDERMAL | Status: DC
  Administered 2015-04-06: 175 ug via TRANSDERMAL
  Filled 2015-04-06: qty 1

## 2015-04-06 MED ORDER — HYDROMORPHONE HCL 1 MG/ML IJ SOLN: 1.0000 mg | Freq: Once | INTRAMUSCULAR | Status: DC

## 2015-04-06 MED ORDER — ENOXAPARIN SODIUM 40 MG/0.4ML ~~LOC~~ SOLN
40.0000 mg | SUBCUTANEOUS | Status: DC
  Filled 2015-04-06: qty 0.4

## 2015-04-06 MED ORDER — DULOXETINE HCL 60 MG PO CPEP
60.0000 mg | ORAL_CAPSULE | Freq: Two times a day (BID) | ORAL | Status: DC
  Administered 2015-04-06 – 2015-04-08 (×4): 60 mg via ORAL
  Filled 2015-04-06 (×4): qty 1

## 2015-04-06 MED ORDER — HYDROMORPHONE HCL 1 MG/ML IJ SOLN
1.0000 mg | Freq: Once | INTRAMUSCULAR | Status: AC
  Administered 2015-04-06: 1 mg via INTRAVENOUS
  Filled 2015-04-06: qty 1

## 2015-04-06 MED ORDER — BUPROPION HCL ER (SR) 150 MG PO TB12
150.0000 mg | ORAL_TABLET | Freq: Two times a day (BID) | ORAL | Status: DC
  Administered 2015-04-07 – 2015-04-08 (×3): 150 mg via ORAL
  Filled 2015-04-06 (×8): qty 1

## 2015-04-06 MED ORDER — LEVALBUTEROL TARTRATE 45 MCG/ACT IN AERO: 2.0000 | INHALATION_SPRAY | RESPIRATORY_TRACT | Status: DC | PRN

## 2015-04-06 MED ORDER — ACETAMINOPHEN 325 MG PO TABS: 650.0000 mg | ORAL_TABLET | ORAL | Status: DC | PRN

## 2015-04-06 MED ORDER — ATORVASTATIN CALCIUM 10 MG PO TABS
10.0000 mg | ORAL_TABLET | Freq: Every day | ORAL | Status: DC
  Administered 2015-04-07 – 2015-04-08 (×2): 10 mg via ORAL
  Filled 2015-04-06 (×2): qty 1

## 2015-04-06 MED ORDER — MOMETASONE FURO-FORMOTEROL FUM 100-5 MCG/ACT IN AERO
INHALATION_SPRAY | RESPIRATORY_TRACT | Status: AC
  Filled 2015-04-06: qty 8.8

## 2015-04-06 MED ORDER — ALBUTEROL SULFATE (2.5 MG/3ML) 0.083% IN NEBU: 2.5000 mg | INHALATION_SOLUTION | Freq: Four times a day (QID) | RESPIRATORY_TRACT | Status: DC | PRN

## 2015-04-06 MED ORDER — CHROMIUM 200 MCG PO CAPS: 1.0000 | ORAL_CAPSULE | Freq: Every day | ORAL | Status: DC

## 2015-04-06 MED ORDER — ALBUTEROL SULFATE (2.5 MG/3ML) 0.083% IN NEBU: 2.5000 mg | INHALATION_SOLUTION | RESPIRATORY_TRACT | Status: DC | PRN

## 2015-04-06 MED ORDER — DIAZEPAM 5 MG/ML IJ SOLN
5.0000 mg | Freq: Once | INTRAMUSCULAR | Status: AC
  Administered 2015-04-06: 5 mg via INTRAVENOUS
  Filled 2015-04-06: qty 2

## 2015-04-06 MED ORDER — ASPIRIN 81 MG PO CHEW
324.0000 mg | CHEWABLE_TABLET | Freq: Once | ORAL | Status: DC
  Filled 2015-04-06: qty 4

## 2015-04-06 MED ORDER — VITAMIN C 500 MG PO TABS
1000.0000 mg | ORAL_TABLET | Freq: Two times a day (BID) | ORAL | Status: DC
Start: 2015-04-06 — End: 2015-04-08
  Administered 2015-04-06 – 2015-04-08 (×4): 1000 mg via ORAL
  Filled 2015-04-06 (×4): qty 2

## 2015-04-06 MED ORDER — DIAZEPAM 5 MG/ML IJ SOLN
5.0000 mg | Freq: Once | INTRAMUSCULAR | Status: DC
  Filled 2015-04-06: qty 2

## 2015-04-06 MED ORDER — NITROGLYCERIN 0.4 MG SL SUBL: 0.4000 mg | SUBLINGUAL_TABLET | SUBLINGUAL | Status: DC | PRN

## 2015-04-06 MED ORDER — MOMETASONE FURO-FORMOTEROL FUM 100-5 MCG/ACT IN AERO
2.0000 | INHALATION_SPRAY | Freq: Two times a day (BID) | RESPIRATORY_TRACT | Status: DC
Start: 2015-04-06 — End: 2015-04-08
  Administered 2015-04-06 – 2015-04-08 (×4): 2 via RESPIRATORY_TRACT
  Filled 2015-04-06: qty 8.8

## 2015-04-06 MED ORDER — CARVEDILOL 3.125 MG PO TABS
6.2500 mg | ORAL_TABLET | Freq: Two times a day (BID) | ORAL | Status: DC
  Administered 2015-04-06 – 2015-04-08 (×4): 6.25 mg via ORAL
  Filled 2015-04-06 (×4): qty 2

## 2015-04-06 NOTE — ED Provider Notes (Signed)
I assumed care in signout to f/u on repeat troponin Repeat labs negative Pt still reports CP and he reports he does not feel comfortable going home Will admit D/w dr Anastasio Champion for admission   Ripley Fraise, MD 04/06/15 [phone]

## 2015-04-06 NOTE — H&P (Signed)
[facility] History and Physical  EUAL GUIDICE [id number] DOB: 1950-11-01 DOA: 04/06/2015  Referring physician: ER PCP: Dale Fraise, MD   Chief Complaint: Chest pain  HPI: Dale Shields is a 64 y.o. male  This is a 64 year old man, who, according to the medical record, has a history of coronary artery disease, who presents with chest tightness and pain which she has had since it started last night while he was lying down to go to sleep. The pain does not appear to be pleuritic in nature. He apparently says that it lasted for 6 hours intermittently throughout the night. It then seemed to ease off and when he woke up this morning the pain was present again. He says that he has become more short of breath as he walks in general and feels weak all over. He denies any cough or fever. He does describe diffuse body pains all over. He does have a history of multiple sclerosis and fibromyalgia and is on fentanyl patch on a chronic basis. He also has a neurogenic bladder and has an indwelling Foley catheter. Initial troponins twice have been negative but he is still concerned about this pain and he is now being admitted for further investigations.   Review of Systems:  Apart from symptoms above, all systems are negative.  Past Medical History  Diagnosis Date  . Fibromyalgia   . Multiple sclerosis ([place])     with optic neuritis  . Narcolepsy   . Scoliosis   . Hypothyroidism   . HLD (hyperlipidemia)   . Anemia of chronic disease   . Thrombocytopenia ([place])   . Pulmonary embolism ([place])     "suspected; never confirmed"--? treated with coumadin  . COPD (chronic obstructive pulmonary disease) ([place])   . Insomnia   . Carpal tunnel syndrome   . GERD (gastroesophageal reflux disease)   . BPH (benign prostatic hypertrophy)   . History of gout   . Neurogenic bladder     chronic indwelling foley catheter-changed monthly  . Optic neuritis   . History of epistaxis   . History of  MRSA infection 2012    UTI, bacteremia, and endocarditis  . Endocarditis 2012  . Supraspinatus tendon tear 2012  . Heart murmur   . Myocardial infarction [facility])     "according to stress test; that's news to me"  . Exertional dyspnea   . OSA (obstructive sleep apnea)     "tried CPAP; lost more sleep w/use"  . History of blood transfusion     "gave me too much coumadin and heparin once"  . H/O hiatal hernia   . Osteoarthritis of multiple joints     "everywhere"  . DDD (degenerative disc disease)     knees, hips, shoulders--Dr. Ronnie Derby [facility] prior to this)  . CAD (coronary artery disease) 11/2011    Stents x 3  . Anxiety and depression   . Steroid-induced psychosis   . History of renal failure 2011    "on dialysis 3 times over 2 years"  . Hydronephrosis   . Anxiety   . Depression   . Hypertension   . Necrotizing fasciitis [facility])    Past Surgical History  Procedure Laterality Date  . Carpal tunnel release  ~ 2010    left with radius fracture repair  . Nasal cautery    . Skin graft  04/2009    right thigh to right "wrist all the way to my shoulder; necrotizing fasciitis  . Colonoscopy  2000; 2005; 03/15/2011  diminutve adenoma--recall 2017  . Coronary angioplasty  11/22/2011    3 vessels  . Coronary angioplasty with stent placement  11/22/2011    DES to LAD  . Knee arthroscopy w/ meniscal repair  1990's    right  . Left heart catheterization with coronary angiogram N/A 11/22/2011    Procedure: LEFT HEART CATHETERIZATION WITH CORONARY ANGIOGRAM;  Surgeon: Laverda Page, MD;  Location: [facility] CATH LAB;  Service: Cardiovascular;  Laterality: N/A;  . Percutaneous coronary stent intervention (pci-s)  11/22/2011    Procedure: PERCUTANEOUS CORONARY STENT INTERVENTION (PCI-S);  Surgeon: Laverda Page, MD;  Location: [facility] CATH LAB;  Service: Cardiovascular;;   Social History:  reports that he quit smoking about 7 months ago. He has never used smokeless tobacco. He reports that he does not  drink alcohol or use illicit drugs.  Allergies  Allergen Reactions  . Ultram [Tramadol Hcl] Other (See Comments)    Seizures; "grand mal; twice"    Family History  Problem Relation Age of Onset  . Alzheimer's disease Mother   . Colon cancer Neg Hx     Prior to Admission medications   Medication Sig Start Date End Date Taking? Authorizing Provider  albuterol (PROVENTIL) (2.5 MG/3ML) 0.083% nebulizer solution Take 2.5 mg by nebulization every 6 (six) hours as needed for wheezing or shortness of breath.   Yes Historical Provider, MD  APPLE CIDER VINEGAR PO Take 1 tablet by mouth daily.   Yes Historical Provider, MD  Armodafinil (NUVIGIL) 250 MG tablet Take 1 tablet (250 mg total) by mouth every morning. For symptoms of MS 03/30/15  Yes Timmothy Euler, MD  aspirin 81 MG chewable tablet Chew 81 mg by mouth daily.    Yes Historical Provider, MD  atorvastatin (LIPITOR) 10 MG tablet Take 10 mg by mouth daily.   Yes Historical Provider, MD  b complex vitamins tablet Take 1 tablet by mouth daily.   Yes Historical Provider, MD  buPROPion (WELLBUTRIN SR) 150 MG 12 hr tablet Take 150 mg by mouth 2 (two) times daily.   Yes Historical Provider, MD  carvedilol (COREG) 6.25 MG tablet Take 6.25 mg by mouth 2 (two) times daily.    Yes Historical Provider, MD  Cholecalciferol (VITAMIN D PO) Take 1 tablet by mouth daily.   Yes Historical Provider, MD  Chromium 200 MCG CAPS Take 1 capsule by mouth daily.    Yes Historical Provider, MD  clonazePAM (KLONOPIN) 0.5 MG tablet 0.5 tab q am and 1 tab each evening as needed for anxiety Patient taking differently: Take 0.5 mg by mouth 2 (two) times daily as needed for anxiety.  02/18/15  Yes Dale Fraise, MD  DULoxetine (CYMBALTA) 60 MG capsule Take 1 capsule (60 mg total) by mouth 2 (two) times daily. 02/18/15  Yes Dale Fraise, MD  fentaNYL (DURAGESIC - DOSED MCG/HR) 100 MCG/HR Place 175 mcg onto the skin every 3 (three) days. Uses with 75 mcg patch to total 175  mcg.   Yes Historical Provider, MD  fentaNYL (DURAGESIC - DOSED MCG/HR) 75 MCG/HR Place 175 mcg onto the skin every 3 (three) days. Applies 119mcg and 64mcg for a total of 139mcg every 72H 10/25/14  Yes Historical Provider, MD  Fish Oil-Cholecalciferol (FISH OIL + D3 PO) Take 1 capsule by mouth daily.    Yes Historical Provider, MD  Flaxseed, Linseed, (FLAXSEED OIL) 1000 MG CAPS Take 1 capsule by mouth daily.    Yes Historical Provider, MD  Fluticasone-Salmeterol (ADVAIR) 250-50 MCG/DOSE AEPB Inhale  1 puff into the lungs 2 (two) times daily.   Yes Historical Provider, MD  levalbuterol [facility] HFA) 45 MCG/ACT inhaler Inhale 2 puffs into the lungs every 4 (four) hours as needed for wheezing.   Yes Historical Provider, MD  Misc Natural Products (RED WINE COMPLEX PO) Take 1 capsule by mouth daily.   Yes Historical Provider, MD  Multiple Vitamins-Minerals (THEREMS-M) TABS Take 1 tablet by mouth daily.   Yes Historical Provider, MD  nitroGLYCERIN (NITROSTAT) 0.3 MG SL tablet Place 0.4 mg under the tongue every 5 (five) minutes as needed for chest pain.   Yes Historical Provider, MD  VITAMIN A PO Take 1 tablet by mouth daily.   Yes Historical Provider, MD  vitamin C (ASCORBIC ACID) 500 MG tablet Take 1,000 mg by mouth 2 (two) times daily.    Yes Historical Provider, MD  VITAMIN E PO Take 1 tablet by mouth daily.   Yes Historical Provider, MD   Physical Exam: Filed Vitals:   04/06/15 1240 04/06/15 1545  BP: 149/97   Pulse: 76 76  Temp: 98.2 F (36.8 C)   TempSrc: Oral   Resp: 12 11  Height: 5\' 10"  (1.778 m)   Weight: 90.719 kg (200 lb)   SpO2: 95% 100%    Wt Readings from Last 3 Encounters:  04/06/15 90.719 kg (200 lb)  02/18/15 87.544 kg (193 lb)  01/13/15 87.635 kg (193 lb 3.2 oz)    General:  Appears calm and comfortable. He does not appear to be in pain at the present time. Eyes: PERRL, normal lids, irises & conjunctiva ENT: grossly normal hearing, lips & tongue Neck: no LAD, masses  or thyromegaly Cardiovascular: RRR, no m/r/g. No LE edema. Telemetry: SR, no arrhythmias  Respiratory: CTA bilaterally, no w/r/r. Normal respiratory effort. Abdomen: soft, ntnd Skin: no rash or induration seen on limited exam Musculoskeletal: grossly normal tone BUE/BLE Psychiatric: grossly normal mood and affect, speech fluent and appropriate Neurologic: grossly non-focal.          Labs on Admission:  Basic Metabolic Panel:  Recent Labs Lab 04/06/15 1328  NA 137  K 4.3  CL 99*  CO2 30  GLUCOSE 123*  BUN 27*  CREATININE 1.04  CALCIUM 9.2   Liver Function Tests: No results for input(s): AST, ALT, ALKPHOS, BILITOT, PROT, ALBUMIN in the last 168 hours. No results for input(s): LIPASE, AMYLASE in the last 168 hours. No results for input(s): AMMONIA in the last 168 hours. CBC:  Recent Labs Lab 04/06/15 1328  WBC 9.0  NEUTROABS 6.4  HGB 11.9*  HCT 37.2*  MCV 94.9  PLT 251   Cardiac Enzymes:  Recent Labs Lab 04/06/15 1328 04/06/15 1549  CKTOTAL  --  94  TROPONINI <0.03 <0.03    BNP (last 3 results) No results for input(s): BNP in the last 8760 hours.  ProBNP (last 3 results) No results for input(s): PROBNP in the last 8760 hours.  CBG: No results for input(s): GLUCAP in the last 168 hours.  Radiological Exams on Admission: Dg Chest 1 View  04/06/2015  CLINICAL DATA:  Left-sided chest pain.  Weakness. EXAM: CHEST 1 VIEW COMPARISON:  PA and lateral chest 11/15/2014 in 09/29/2010. FINDINGS: The lungs are clear. Heart size is normal. No pneumothorax or pleural effusion. No focal bony abnormality. IMPRESSION: No acute disease. Electronically Signed   By: Inge Rise M.D.   On: 04/06/2015 14:40    EKG: Independently reviewed. Sinus rhythm without any acute ST-T wave changes.  Assessment/Plan  1. Chest pain. The etiology is unclear but he does have risk factors and previous history of coronary artery disease according to the medical record. Cycle  troponin levels. Cardiology consultation in the morning. 2. Chronic pain syndrome. It is entirely possible that his pain is part of this syndrome. 3. Multiple sclerosis. Appears to be stable.  He'll be admitted to the telemetry floor under observation. Further recommendations will depend on patient's hospital progress.  Code Status: Full code.   DVT Prophylaxis: Lovenox.  Family Communication: I discussed the plan with the patient at the bedside  Disposition Plan: Home when medically stable.  Time spent: 45 minutes.  Doree Albee [facility] Pager [phone].

## 2015-04-06 NOTE — ED Provider Notes (Signed)
CSN: [id number]     Arrival date & time 04/06/15  1235 History   First MD Initiated Contact with Patient 04/06/15 1245     Chief Complaint  Patient presents with  . Chest Pain     (Consider location/radiation/quality/duration/timing/severity/associated sxs/prior Treatment) HPI   64 year old male with chest pain. Patient describes as a tightness in the center his chest. Onset last night while he is lying down to go to sleep. Pain has been persistent since then but worsening shortly before arrival. Today he tried taking nitroglycerin 2 with no significant effect on his pain. Feels generally weak all over. No acute respiratory complaints. No nausea. No diaphoresis. Patient has a significant past medical history. Various chronic pains. Is complaining of diffuse body pain but states that this is different than his typical symptoms. He denies any fever.  Past Medical History  Diagnosis Date  . Fibromyalgia   . Multiple sclerosis ([place])     with optic neuritis  . Narcolepsy   . Scoliosis   . Hypothyroidism   . HLD (hyperlipidemia)   . Anemia of chronic disease   . Thrombocytopenia ([place])   . Pulmonary embolism ([place])     "suspected; never confirmed"--? treated with coumadin  . COPD (chronic obstructive pulmonary disease) ([place])   . Insomnia   . Carpal tunnel syndrome   . GERD (gastroesophageal reflux disease)   . BPH (benign prostatic hypertrophy)   . History of gout   . Neurogenic bladder     chronic indwelling foley catheter-changed monthly  . Optic neuritis   . History of epistaxis   . History of MRSA infection 2012    UTI, bacteremia, and endocarditis  . Endocarditis 2012  . Supraspinatus tendon tear 2012  . Heart murmur   . Myocardial infarction [facility])     "according to stress test; that's news to me"  . Exertional dyspnea   . OSA (obstructive sleep apnea)     "tried CPAP; lost more sleep w/use"  . History of blood transfusion     "gave me too much coumadin and heparin  once"  . H/O hiatal hernia   . Osteoarthritis of multiple joints     "everywhere"  . DDD (degenerative disc disease)     knees, hips, shoulders--Dr. Ronnie Derby [facility] prior to this)  . CAD (coronary artery disease) 11/2011    Stents x 3  . Anxiety and depression   . Steroid-induced psychosis   . History of renal failure 2011    "on dialysis 3 times over 2 years"  . Hydronephrosis   . Anxiety   . Depression   . Hypertension   . Necrotizing fasciitis [facility])    Past Surgical History  Procedure Laterality Date  . Carpal tunnel release  ~ 2010    left with radius fracture repair  . Nasal cautery    . Skin graft  04/2009    right thigh to right "wrist all the way to my shoulder; necrotizing fasciitis  . Colonoscopy  2000; 2005; 03/15/2011    diminutve adenoma--recall 2017  . Coronary angioplasty  11/22/2011    3 vessels  . Coronary angioplasty with stent placement  11/22/2011    DES to LAD  . Knee arthroscopy w/ meniscal repair  1990's    right  . Left heart catheterization with coronary angiogram N/A 11/22/2011    Procedure: LEFT HEART CATHETERIZATION WITH CORONARY ANGIOGRAM;  Surgeon: Laverda Page, MD;  Location: [facility] CATH LAB;  Service: Cardiovascular;  Laterality: N/A;  .  Percutaneous coronary stent intervention (pci-s)  11/22/2011    Procedure: PERCUTANEOUS CORONARY STENT INTERVENTION (PCI-S);  Surgeon: Laverda Page, MD;  Location: [facility] CATH LAB;  Service: Cardiovascular;;   Family History  Problem Relation Age of Onset  . Alzheimer's disease Mother   . Colon cancer Neg Hx    Social History  Substance Use Topics  . Smoking status: Former Smoker -- 0.00 packs/day for 40 years    Quit date: 08/15/2014  . Smokeless tobacco: Never Used     Comment: Counseling sheet to quit smoking given in exam room   . Alcohol Use: No     Comment: 11/22/2011 "like crazy til 1988; nothing since"    Review of Systems  All systems reviewed and negative, other than as noted in  HPI.   Allergies  Ultram  Home Medications   Prior to Admission medications   Medication Sig Start Date End Date Taking? Authorizing Provider  albuterol (PROVENTIL) (2.5 MG/3ML) 0.083% nebulizer solution Take 2.5 mg by nebulization every 6 (six) hours as needed for wheezing or shortness of breath.    Historical Provider, MD  APPLE CIDER VINEGAR PO Take 1 tablet by mouth daily.    Historical Provider, MD  Armodafinil (NUVIGIL) 250 MG tablet Take 1 tablet (250 mg total) by mouth every morning. For symptoms of MS 03/30/15   Timmothy Euler, MD  aspirin 81 MG chewable tablet Chew 81 mg by mouth daily.     Historical Provider, MD  atorvastatin (LIPITOR) 10 MG tablet Take 10 mg by mouth daily.    Historical Provider, MD  b complex vitamins tablet Take 1 tablet by mouth daily.    Historical Provider, MD  buPROPion (WELLBUTRIN SR) 150 MG 12 hr tablet Take 150 mg by mouth 2 (two) times daily.    Historical Provider, MD  carvedilol (COREG) 6.25 MG tablet Take 6.25 mg by mouth 2 (two) times daily.     Historical Provider, MD  Cholecalciferol (VITAMIN D PO) Take 1 tablet by mouth daily.    Historical Provider, MD  Chromium 200 MCG CAPS Take by mouth.    Historical Provider, MD  clonazePAM (KLONOPIN) 0.5 MG tablet 0.5 tab q am and 1 tab each evening as needed for anxiety 02/18/15   [name], MD  DULoxetine (CYMBALTA) 60 MG capsule Take 1 capsule (60 mg total) by mouth 2 (two) times daily. 02/18/15   [name], MD  fentaNYL (DURAGESIC - DOSED MCG/HR) 75 MCG/HR Place 175 mcg onto the skin every 3 (three) days. Applies 157mcg and 67mcg for a total of 181mcg every 72H 10/25/14   Historical Provider, MD  Fish Oil-Cholecalciferol (FISH OIL + D3 PO) Take by mouth.    Historical Provider, MD  Flaxseed, Linseed, (FLAXSEED OIL) 1000 MG CAPS Take by mouth.    Historical Provider, MD  Fluticasone-Salmeterol (ADVAIR) 250-50 MCG/DOSE AEPB Inhale 1 puff into the lungs 2 (two) times daily.    Historical  Provider, MD  levalbuterol [facility] HFA) 45 MCG/ACT inhaler Inhale 2 puffs into the lungs every 4 (four) hours as needed for wheezing.    Historical Provider, MD  Misc Natural Products (RED WINE COMPLEX PO) Take 1 capsule by mouth daily.    Historical Provider, MD  Multiple Vitamins-Minerals (THEREMS-M) TABS Take 1 tablet by mouth daily.    Historical Provider, MD  nitroGLYCERIN (NITROSTAT) 0.3 MG SL tablet Place 0.4 mg under the tongue every 5 (five) minutes as needed for chest pain.    Historical Provider, MD  VITAMIN A PO Take 1 tablet by mouth daily.    Historical Provider, MD  vitamin C (ASCORBIC ACID) 500 MG tablet Take 1,000 mg by mouth 2 (two) times daily.     Historical Provider, MD  VITAMIN E PO Take 1 tablet by mouth daily.    Historical Provider, MD   BP 149/97 mmHg  Pulse 76  Temp(Src) 98.2 F (36.8 C) (Oral)  Resp 12  Ht 5\' 10"  (1.778 m)  Wt 200 lb (90.719 kg)  BMI 28.70 kg/m2  SpO2 95% Physical Exam  Constitutional: He appears well-developed and well-nourished. No distress.  HENT:  Head: Normocephalic and atraumatic.  Eyes: Conjunctivae are normal. Right eye exhibits no discharge. Left eye exhibits no discharge.  Neck: Neck supple.  Cardiovascular: Normal rate, regular rhythm and normal heart sounds.  Exam reveals no gallop and no friction rub.   No murmur heard. Pulmonary/Chest: Effort normal and breath sounds normal. No respiratory distress. He exhibits no tenderness.  Right chest wall deformity consistent with prior surgery  Abdominal: Soft. He exhibits no distension. There is no tenderness.  Genitourinary:  foley  Musculoskeletal: He exhibits no edema or tenderness.  Lower extremities symmetric as compared to each other. No calf tenderness. Negative Homan's. No palpable cords.   Neurological: He is alert.  Skin: Skin is warm and dry.  Psychiatric: He has a normal mood and affect. His behavior is normal. Thought content normal.  Nursing note and vitals  reviewed.   ED Course  Procedures (including critical care time) Labs Review Labs Reviewed  MRSA PCR SCREENING - Abnormal; Notable for the following:    MRSA by PCR POSITIVE (*)    All other components within normal limits  CBC WITH DIFFERENTIAL/PLATELET - Abnormal; Notable for the following:    RBC 3.92 (*)    Hemoglobin 11.9 (*)    HCT 37.2 (*)    All other components within normal limits  BASIC METABOLIC PANEL - Abnormal; Notable for the following:    Chloride 99 (*)    Glucose, Bld 123 (*)    BUN 27 (*)    All other components within normal limits  URINALYSIS, ROUTINE W REFLEX MICROSCOPIC (NOT AT [facility]) - Abnormal; Notable for the following:    Hgb urine dipstick TRACE (*)    Nitrite POSITIVE (*)    Leukocytes, UA MODERATE (*)    All other components within normal limits  URINE MICROSCOPIC-ADD ON - Abnormal; Notable for the following:    Squamous Epithelial / LPF 0-5 (*)    Bacteria, UA MANY (*)    All other components within normal limits  BASIC METABOLIC PANEL - Abnormal; Notable for the following:    Chloride 98 (*)    CO2 33 (*)    BUN 23 (*)    All other components within normal limits  URINE CULTURE  TROPONIN I  TROPONIN I  CK  TROPONIN I  TROPONIN I  TROPONIN I  CBC    Imaging Review No results found.   Dg Chest 1 View  04/06/2015  CLINICAL DATA:  Left-sided chest pain.  Weakness. EXAM: CHEST 1 VIEW COMPARISON:  PA and lateral chest 11/15/2014 in 09/29/2010. FINDINGS: The lungs are clear. Heart size is normal. No pneumothorax or pleural effusion. No focal bony abnormality. IMPRESSION: No acute disease. Electronically Signed   By: Inge Rise M.D.   On: 04/06/2015 14:40   I have personally reviewed and evaluated these images and lab results as part of my medical decision-making.  EKG Interpretation   Date/Time:  Monday April 06 2015 12:39:52 EST Ventricular Rate:  76 PR Interval:  196 QRS Duration: 115 QT Interval:  392 QTC Calculation:  441 R Axis:   70 Text Interpretation:  Sinus rhythm Nonspecific intraventricular conduction  delay No significant change since last tracing Confirmed by Safiya Girdler  MD,  Ashaun Gaughan ([id number]) on 04/06/2015 12:47:57 PM      MDM   Final diagnoses:  Chest pain, unspecified chest pain type  Ischemic chest pain ([place])    64 year old male with chest pain. Seems atypical for ACS with constant duration since yesterday. He says it worsened today though. He does have a history of known coronary artery disease status post stenting. His EKG does not appear acutely changed. Initial troponin is normal. Will repeat. Chest x-ray was without acute abnormality. Questionable prior history of PE? No clinical signs or symptoms of DVT. He has no acute respiratory complaints. He is not tachycardic. Lowest oxygen saturation was 95% on room air. He does have a past history of COPD though. Clinically my suspicion for pulmonary embolism is low.    Virgel Manifold, MD 04/09/15 [id number]

## 2015-04-06 NOTE — Discharge Instructions (Signed)
Cardiac-Specific Troponin I and T Test WHY AM I HAVING THIS TEST? You may have this test if you have experienced chest pain. The test can be used to determine if you have had a heart attack or injury to heart (cardiac) muscle. This test can also help predict the possibility of future heart attacks. This test measures the concentration of cardiac-specific troponin in your blood. Troponins are proteins that help muscles contract. There are three forms of troponin, including troponins C, I, and T. The types of troponins I and T that are found in cardiac muscle are different from the troponins I and T that are found in skeletal muscle. Therefore, testing can be done for cardiac-specific troponins I and T. These types of troponin are normally present in very small quantities in the blood. When there is damage to heart muscle cells, cardiac troponins I and T are released into circulation. The more damage there is, the greater the concentration of troponins I and T. When a person has a heart attack, levels of troponin can become elevated in the blood within 3-4 hours after injury and may remain elevated for 10-14 days. WHAT KIND OF SAMPLE IS TAKEN? A blood sample is required for this test. It is usually collected by inserting a needle into a vein. Usually, an initial blood sample is collected, and then another blood sample is collected 12 hours later. After these samples, you will have your blood tested daily for 3-5 days. You might also have it tested weekly for 5-6 weeks. HOW DO I PREPARE FOR THE TEST? There is no preparation required for this test. However, be aware that you will need to make arrangements to have your blood collected frequently.  WHAT ARE THE REFERENCE RANGES? Reference values are considered healthy values established after testing a large group of healthy people. Reference values may vary among different people, labs, and hospitals. It is your responsibility to obtain your test results. Ask  the lab or department performing the test when and how you will get your results. Reference values for cardiac troponins are as follows:  Cardiac troponin T: less than 0.1 ng/mL.  Cardiac troponin I: less than 0.03 ng/mL. WHAT DO THE RESULTS MEAN? Troponin values above the reference values may indicate:  Injury to the heart muscle.  Heart attack. Talk with your health care provider to discuss your results, treatment options, and if necessary, the need for more tests. Talk with your health care provider if you have any questions about your results.   This information is not intended to replace advice given to you by your health care provider. Make sure you discuss any questions you have with your health care provider.   Document Released: 04/30/2004 Document Revised: 04/18/2014 Document Reviewed: 08/21/2013 Elsevier Interactive Patient Education 2016 Elsevier Inc.  Nonspecific Chest Pain It is often hard to find the cause of chest pain. There is always a chance that your pain could be related to something serious, such as a heart attack or a blood clot in your lungs. Chest pain can also be caused by conditions that are not life-threatening. If you have chest pain, it is very important to follow up with your doctor.  HOME CARE  If you were prescribed an antibiotic medicine, finish it all even if you start to feel better.  Avoid any activities that cause chest pain.  Do not use any tobacco products, including cigarettes, chewing tobacco, or electronic cigarettes. If you need help quitting, ask your doctor.  Do not  drink alcohol.  Take medicines only as told by your doctor.  Keep all follow-up visits as told by your doctor. This is important. This includes any further testing if your chest pain does not go away.  Your doctor may tell you to keep your head raised (elevated) while you sleep.  Make lifestyle changes as told by your doctor. These may include:  Getting regular  exercise. Ask your doctor to suggest some activities that are safe for you.  Eating a heart-healthy diet. Your doctor or a diet specialist (dietitian) can help you to learn healthy eating options.  Maintaining a healthy weight.  Managing diabetes, if necessary.  Reducing stress. GET HELP IF:  Your chest pain does not go away, even after treatment.  You have a rash with blisters on your chest.  You have a fever. GET HELP RIGHT AWAY IF:  Your chest pain is worse.  You have an increasing cough, or you cough up blood.  You have severe belly (abdominal) pain.  You feel extremely weak.  You pass out (faint).  You have chills.  You have sudden, unexplained chest discomfort.  You have sudden, unexplained discomfort in your arms, back, neck, or jaw.  You have shortness of breath at any time.  You suddenly start to sweat, or your skin gets clammy.  You feel nauseous.  You vomit.  You suddenly feel light-headed or dizzy.  Your heart begins to beat quickly, or it feels like it is skipping beats. These symptoms may be an emergency. Do not wait to see if the symptoms will go away. Get medical help right away. Call your local emergency services (911 in the [place] Do not drive yourself to the hospital.   This information is not intended to replace advice given to you by your health care provider. Make sure you discuss any questions you have with your health care provider.   Document Released: 09/14/2007 Document Revised: 04/18/2014 Document Reviewed: 11/01/2013 Elsevier Interactive Patient Education [employer].

## 2015-04-06 NOTE — Progress Notes (Signed)
Patient medicated by this RN to aid the patient's primary RN. -patient is alert and in no apparent distress with RR and effort WDL  -NOTE-patient medicated with pain medication patch because the patient reports pain at 8/10 to the (R) hip

## 2015-04-06 NOTE — ED Notes (Signed)
Per EMS: Pt called out for left center cp had one albuterol tx at home as well as 81mg  ASA. Pt has had a total of 2 nitroglycerin odt. Pt states he feels weak all over.  Pt alert and oriented

## 2015-04-07 ENCOUNTER — Observation Stay (HOSPITAL_BASED_OUTPATIENT_CLINIC_OR_DEPARTMENT_OTHER): Payer: Medicare Other

## 2015-04-07 DIAGNOSIS — I2510 Atherosclerotic heart disease of native coronary artery without angina pectoris: Secondary | ICD-10-CM

## 2015-04-07 DIAGNOSIS — M797 Fibromyalgia: Secondary | ICD-10-CM | POA: Diagnosis not present

## 2015-04-07 DIAGNOSIS — R079 Chest pain, unspecified: Secondary | ICD-10-CM | POA: Diagnosis not present

## 2015-04-07 DIAGNOSIS — I509 Heart failure, unspecified: Secondary | ICD-10-CM

## 2015-04-07 DIAGNOSIS — I209 Angina pectoris, unspecified: Secondary | ICD-10-CM | POA: Diagnosis not present

## 2015-04-07 DIAGNOSIS — M419 Scoliosis, unspecified: Secondary | ICD-10-CM | POA: Diagnosis not present

## 2015-04-07 DIAGNOSIS — G35 Multiple sclerosis: Secondary | ICD-10-CM | POA: Diagnosis not present

## 2015-04-07 LAB — TROPONIN I: Troponin I: <0.03 ng/mL (ref <0.031)

## 2015-04-07 LAB — MRSA PCR SCREENING: MRSA by PCR: POSITIVE — AB

## 2015-04-07 MED ORDER — DEXTROSE 5 % IV SOLN
1.0000 g | INTRAVENOUS | Status: DC
  Administered 2015-04-07: 1 g via INTRAVENOUS
  Filled 2015-04-07 (×2): qty 10

## 2015-04-07 NOTE — Care Management Obs Status (Signed)
[place] NOTIFICATION   Patient Details  Name: Dale Shields MRN: [id number] Date of Birth: 08-10-1950   Medicare Observation Status Notification Given:  Yes    Joylene Draft, RN 04/07/2015, 11:10 AM

## 2015-04-07 NOTE — Telephone Encounter (Signed)
Last seen 02/18/15 Dr Livia Snellen  If approved route to nurse to call into

## 2015-04-07 NOTE — Care Management Note (Signed)
Case Management Note  Patient Details  Name: ALDRIN MA MRN: [id number] Date of Birth: [birth year]  Subjective/Objective:                  Pt admitted from home with CP. Pt lives alone and will return home at discharge. Pt stated he has family close by to assist with needs. Pt has an aide, M-F, 5 hours a day. Pt has electric w/c.  Action/Plan: No CM needs noted.  Expected Discharge Date:  04/08/15               Expected Discharge Plan:  Home/Self Care  In-House Referral:  NA  Discharge planning Services  CM Consult  Post Acute Care Choice:  NA Choice offered to:  NA  DME Arranged:    DME Agency:     HH Arranged:    HH Agency:     Status of Service:  Completed, signed off  Medicare Important Message Given:    Date Medicare IM Given:    Medicare IM give by:    Date Additional Medicare IM Given:    Additional Medicare Important Message give by:     If discussed at [place] of Stay Meetings, dates discussed:    Additional Comments:  Joylene Draft, RN 04/07/2015, 11:11 AM

## 2015-04-07 NOTE — Consult Note (Signed)
CARDIOLOGY CONSULT NOTE   Patient ID: NEHAMIAH HIMMELSBACH MRN: [id number] DOB/AGE: May 14, 1950 65 y.o.  Admit Date: 04/06/2015 Referring Physician: [id number] Primary Physician: [name], MD Consulting Cardiologist: Dorris Carnes MD Primary Cardiologist: Adrian Prows MD Reason for Consultation: Chest Pain with known CAD  Clinical Summary Mr. Baires is a 64 y.o.male with known history of CAD with PTCA and scoring balloon angioplasty of the D1 and distal RCA in 11/2011, Stent to LAD, hypertension, hypercholesterolemia. He presented to the ER at [facility] with complaints of chest pain. Other history includes chronic pain syndrome, CIOPD, GERD,neurogenic bladder with indwelling catheter, hyperlipidemia, and hypothyroidism.   On arrival BP 149/97, HR 76, O2 Sat 95%, afebrile. EKG NSR without acute ST/T wave changes. Troponin has been negative X 5. CXR negative for CHF or pneumonia. Labs essentially normal. He was treated with valium 5 mg IV and toradol. He was also given ASA.   On interview of the patient, pain has been unrelenting since Sunday, described as pressure over the left chest, radiating to his back. He states that he was unable to sleep Sunday night due to discomfort. He took 2 NTG and did not find relief. He continues to have pain but not as severe. He has chronic pain and is followed by pain management. He has run out of his doses of Duragesiv patches and klonopin. He has some old low dose Duragesic patches which he sometimes wears. The pain was unlike pre-stent symptoms. He states he never had any pain prior to stents, he had an abnormal stress test. He has been compliant with cardiac medications and follow up. Due to see Dr. Einar Gip next week.   Allergies  Allergen Reactions  . Ultram [Tramadol Hcl] Other (See Comments)    Seizures; "grand mal; twice"    Medications Scheduled Medications: . Armodafinil  250 mg Oral q morning - 10a  . aspirin  81 mg Oral Daily  . atorvastatin  10 mg Oral  Daily  . buPROPion  150 mg Oral BID  . carvedilol  6.25 mg Oral BID  . DULoxetine  60 mg Oral BID  . enoxaparin (LOVENOX) injection  40 mg Subcutaneous Q24H  . fentaNYL  175 mcg Transdermal Q72H  . mometasone-formoterol  2 puff Inhalation BID  . vitamin C  1,000 mg Oral BID    Infusions:    PRN Medications: acetaminophen, albuterol, clonazePAM, morphine injection, nitroGLYCERIN, ondansetron (ZOFRAN) IV   Past Medical History  Diagnosis Date  . Fibromyalgia   . Multiple sclerosis ([place])     with optic neuritis  . Narcolepsy   . Scoliosis   . Hypothyroidism   . HLD (hyperlipidemia)   . Anemia of chronic disease   . Thrombocytopenia ([place])   . Pulmonary embolism ([place])     "suspected; never confirmed"--? treated with coumadin  . COPD (chronic obstructive pulmonary disease) ([place])   . Insomnia   . Carpal tunnel syndrome   . GERD (gastroesophageal reflux disease)   . BPH (benign prostatic hypertrophy)   . History of gout   . Neurogenic bladder     chronic indwelling foley catheter-changed monthly  . Optic neuritis   . History of epistaxis   . History of MRSA infection 2012    UTI, bacteremia, and endocarditis  . Endocarditis 2012  . Supraspinatus tendon tear 2012  . Heart murmur   . Myocardial infarction [facility])     "according to stress test; that's news to me"  . Exertional dyspnea   . OSA (  obstructive sleep apnea)     "tried CPAP; lost more sleep w/use"  . History of blood transfusion     "gave me too much coumadin and heparin once"  . H/O hiatal hernia   . Osteoarthritis of multiple joints     "everywhere"  . DDD (degenerative disc disease)     knees, hips, shoulders--Dr. Ronnie Derby [facility] prior to this)  . CAD (coronary artery disease) 11/2011    Stents x 3  . Anxiety and depression   . Steroid-induced psychosis   . History of renal failure 2011    "on dialysis 3 times over 2 years"  . Hydronephrosis   . Anxiety   . Depression   . Hypertension   . Necrotizing  fasciitis [facility])     Past Surgical History  Procedure Laterality Date  . Carpal tunnel release  ~ 2010    left with radius fracture repair  . Nasal cautery    . Skin graft  04/2009    right thigh to right "wrist all the way to my shoulder; necrotizing fasciitis  . Colonoscopy  2000; 2005; 03/15/2011    diminutve adenoma--recall 2017  . Coronary angioplasty  11/22/2011    3 vessels  . Coronary angioplasty with stent placement  11/22/2011    DES to LAD  . Knee arthroscopy w/ meniscal repair  1990's    right  . Left heart catheterization with coronary angiogram N/A 11/22/2011    Procedure: LEFT HEART CATHETERIZATION WITH CORONARY ANGIOGRAM;  Surgeon: Laverda Page, MD;  Location: [facility] CATH LAB;  Service: Cardiovascular;  Laterality: N/A;  . Percutaneous coronary stent intervention (pci-s)  11/22/2011    Procedure: PERCUTANEOUS CORONARY STENT INTERVENTION (PCI-S);  Surgeon: Laverda Page, MD;  Location: [facility] CATH LAB;  Service: Cardiovascular;;    Family History  Problem Relation Age of Onset  . Alzheimer's disease Mother   . Colon cancer Neg Hx     Social History Mr. Centner reports that he quit smoking about 7 months ago. He has never used smokeless tobacco. Mr. Kalan reports that he does not drink alcohol.  Review of Systems Complete review of systems are found to be negative unless outlined in H&P above.  Physical Examination Blood pressure 135/78, pulse 60, temperature 98.2 F (36.8 C), temperature source Oral, resp. rate 16, height 5\' 10"  (1.778 m), weight 214 lb 1.1 oz (97.1 kg), SpO2 94 %.  Intake/Output Summary (Last 24 hours) at 04/07/15 0932 Last data filed at 04/07/15 0848  Gross per 24 hour  Intake    240 ml  Output    950 ml  Net   -710 ml    Telemetry: SR HR in the 60-80's.   GEN: No acute distress  HEENT: Conjunctiva and lids normal, oropharynx clear with moist mucosa. Neck: Supple, no elevated JVP or carotid bruits, no thyromegaly. Chest  Minimal  tenderness to palpation   Lungs: Bilateral crackles. No wheezes.  Cardiac: Regular rate and rhythm, no S3 or significant systolic murmur, no pericardial rub. Abdomen: Soft, nontender, no hepatomegaly, bowel sounds present, no guarding or rebound. Extremities: No pitting edema, distal pulses 2+. Skin: Warm and dry. Musculoskeletal: No kyphosis. Neuropsychiatric: Alert and oriented x3, affect grossly appropriate.  Prior Cardiac Testing/Procedures 1.Cardiac Cath 11/22/2011  Left ventricular pressure was 91/4 with LVEDP of 11 mm mercury. Aortic pressure was 94/47 with a mean of 64 mm mercury. No pressure gradient across the AV. Left ventricle: Performed in the RAO projection revealed LVEF of 60%. There was  no MR. no Wall motion abnormality. Right coronary artery: The vessel is Dominant. Midsegment of the RCA showed a 40-50% diffuse luminal irregularity. Distal segment of the right coronary artery prior to the bifurcation of PDA and PL showed 90% stenosis. Left main coronary artery is large and normal. It is very short and bifurcates immediately into LAD and circumflex coronary artery Circumflex coronary artery: A large vessel giving origin to a large obtuse marginal 1. Circumflex coronary artery has mild diffuse luminal irregularity. It also gives origin to a moderate sized OM 2. LAD: LAD gives origin to a large diagonal-1. LAD has 80-90% stenosis in the proximal segment. There was also mild luminal irregularities in the mid to distal segment. Ostium of the D1 had a 90% stenosis.  Interventional data: Successful PTCA and stenting of the proximal LAD after performing angina score with a 3.0 x 6 mm angiosculpt balloon at a peak of 18 atmospheric pressure followed by stent implantation with a 3.5 x 12 mm Xience Xpedition drug-eluting stent. \Successful PTCA and scoring balloon angioplasty with the same balloon into the ostium of the D1 with reduction of stenosis from 90% to less than 20%. Successful  PTCA and scoring balloon angioplasty of the same scoring balloon into the distal right coronary artery with reduction of stenosis from 90% to 0%.  TIMI-3 to TIMI-3 flow maintained throughout the procedure and percent of the procedure. There was no immediate complications.   Echocardiogram 08/22/2014 Left ventricle: Diffuse hypokinesis worse in the septum Image  qualtiy is poor. Consider MRI or MUGA if clinically indicated to  quantitate EF The cavity size was mildly dilated. The estimated  ejection fraction was 40%. - Atrial septum: There was a patent foramen ovale. Lab Results  Basic Metabolic Panel:  Recent Labs Lab 04/06/15 1328  NA 137  K 4.3  CL 99*  CO2 30  GLUCOSE 123*  BUN 27*  CREATININE 1.04  CALCIUM 9.2    CBC:  Recent Labs Lab 04/06/15 1328  WBC 9.0  NEUTROABS 6.4  HGB 11.9*  HCT 37.2*  MCV 94.9  PLT 251    Cardiac Enzymes:  Recent Labs Lab 04/06/15 1328 04/06/15 1549 04/06/15 2030 04/06/15 2317 04/07/15 0215  CKTOTAL  --  94  --   --   --   TROPONINI <0.03 <0.03 <0.03 <0.03 <0.03    Radiology: Dg Chest 1 View  04/06/2015  CLINICAL DATA:  Left-sided chest pain.  Weakness. EXAM: CHEST 1 VIEW COMPARISON:  PA and lateral chest 11/15/2014 in 09/29/2010. FINDINGS: The lungs are clear. Heart size is normal. No pneumothorax or pleural effusion. No focal bony abnormality. IMPRESSION: No acute disease. Electronically Signed   By: Inge Rise M.D.   On: 04/06/2015 14:40     ECG: NSR rate of 61 bpm.   Impression and Recommendations  1.Atypical Chest Pain:  He states it is constant, waxing and waning but never actually goes away. He states that he did not have pain prior to his stent placement. Troponin is negative X 5. EKG is normal. Would continue medical management and have him follow up with Dr. Einar Gip on previously scheduled appointment next week. Continue BB, ASA, Statin.    2.CAD: Hx of stent to D1 and RCA per Dr. Einar Gip in 2013.    3.COPD: Chronic dyspnea on exertion. He does not follow a pulmonologist.   4.Chronic Pain Syndrome: Followed by pain clinic. Has run out of higher dose Duragesic patches. Follow up with them.   5,. Hypertension: Well  controlled. No changes.   Signed: Phill Myron. Lawrence NP AACC  04/07/2015, 9:32 AM Co-Sign MD  Pt seen and examined  I have amended note above by Arnold Long to reflect my findings.  I do not think patients  Pains are cardiac in origin  OK to d/c from cardiac standpoint  Pt will follow up with J Ganji.  Dorris Carnes

## 2015-04-07 NOTE — Progress Notes (Signed)
[facility] PROGRESS NOTE   Dale Shields [id number] DOB: [birth year] DOA: 04/06/2015 PCP: [name], MD  HPI/Subjective: Substernal chest pain comes and goes. Evaluated by cardiology and recommended outpatient workup with his primary cardiologist.  Assessment/Plan: Active Problems:   Chronic pain syndrome   Multiple sclerosis (HCC)   CAD (coronary artery disease)   Chest pain, rule out acute myocardial infarction   Chest pain   Chest pain Atypical chest pain, substernal but radiates to the left side and not relieved by rest or nitroglycerin.  Multiple risk factors, has history of 3 stents placed in 2013. Seen by cardiology and recommended outpatient workup with primary cardiology. Will add GI cocktail.   UTI Has indwelling Foley catheter secondary to multiple sclerosis.  Mentioned suprapubic pain and some chills, urinalysis consistent with UTI. I will start on Rocephin, discontinue antibiotics according to the culture results.  Chronic pain syndrome. It is possible that his pain is part of this syndrome.  Multiple sclerosis. Appears to be stable.  Code Status: Full Code Family Communication: Plan discussed with the patient. Disposition Plan: Remains inpatient Diet: Diet Heart Room service appropriate?: Yes; Fluid consistency:: Thin  Consultants:   Cardiology   Procedures:  None  Antibiotics:  Rocephin   Objective: Filed Vitals:   04/06/15 2133 04/07/15 0606  BP: 118/60 135/78  Pulse: 72 60  Temp: 98.2 F (36.8 C) 98.2 F (36.8 C)  Resp: 16 16    Intake/Output Summary (Last 24 hours) at 04/07/15 1217 Last data filed at 04/07/15 0848  Gross per 24 hour  Intake    240 ml  Output    950 ml  Net   -710 ml   Filed Weights   04/06/15 1240 04/06/15 1828  Weight: 90.719 kg (200 lb) 97.1 kg (214 lb 1.1 oz)    Exam: General: Alert and awake, oriented x3, not in any acute distress. HEENT: anicteric sclera, pupils reactive to light  and accommodation, EOMI CVS: S1-S2 clear, no murmur rubs or gallops Chest: clear to auscultation bilaterally, no wheezing, rales or rhonchi Abdomen: soft nontender, nondistended, normal bowel sounds, no organomegaly Extremities: no cyanosis, clubbing or edema noted bilaterally Neuro: Cranial nerves II-XII intact, no focal neurological deficits  Data Reviewed: Basic Metabolic Panel:  Recent Labs Lab 04/06/15 1328  NA 137  K 4.3  CL 99*  CO2 30  GLUCOSE 123*  BUN 27*  CREATININE 1.04  CALCIUM 9.2   Liver Function Tests: No results for input(s): AST, ALT, ALKPHOS, BILITOT, PROT, ALBUMIN in the last 168 hours. No results for input(s): LIPASE, AMYLASE in the last 168 hours. No results for input(s): AMMONIA in the last 168 hours. CBC:  Recent Labs Lab 04/06/15 1328  WBC 9.0  NEUTROABS 6.4  HGB 11.9*  HCT 37.2*  MCV 94.9  PLT 251   Cardiac Enzymes:  Recent Labs Lab 04/06/15 1328 04/06/15 1549 04/06/15 2030 04/06/15 2317 04/07/15 0215  CKTOTAL  --  94  --   --   --   TROPONINI <0.03 <0.03 <0.03 <0.03 <0.03   BNP (last 3 results) No results for input(s): BNP in the last 8760 hours.  ProBNP (last 3 results) No results for input(s): PROBNP in the last 8760 hours.  CBG: No results for input(s): GLUCAP in the last 168 hours.  Micro No results found for this or any previous visit (from the past 240 hour(s)).   Studies: Dg Chest 1 View  04/06/2015  CLINICAL DATA:  Left-sided chest pain.  Weakness. EXAM: CHEST  1 VIEW COMPARISON:  PA and lateral chest 11/15/2014 in 09/29/2010. FINDINGS: The lungs are clear. Heart size is normal. No pneumothorax or pleural effusion. No focal bony abnormality. IMPRESSION: No acute disease. Electronically Signed   By: Inge Rise M.D.   On: 04/06/2015 14:40    Scheduled Meds: . Armodafinil  250 mg Oral q morning - 10a  . aspirin  81 mg Oral Daily  . atorvastatin  10 mg Oral Daily  . buPROPion  150 mg Oral BID  . carvedilol   6.25 mg Oral BID  . DULoxetine  60 mg Oral BID  . enoxaparin (LOVENOX) injection  40 mg Subcutaneous Q24H  . fentaNYL  175 mcg Transdermal Q72H  . mometasone-formoterol  2 puff Inhalation BID  . vitamin C  1,000 mg Oral BID   Continuous Infusions:      Time spent: 35 minutes    [facility] A  [facility] Pager [phone] If 7PM-7AM, please contact night-coverage at [email or web address], password [email or web address] 04/07/2015, 12:17 PM

## 2015-04-08 DIAGNOSIS — R0789 Other chest pain: Secondary | ICD-10-CM | POA: Diagnosis not present

## 2015-04-08 DIAGNOSIS — G894 Chronic pain syndrome: Secondary | ICD-10-CM | POA: Diagnosis not present

## 2015-04-08 DIAGNOSIS — N390 Urinary tract infection, site not specified: Secondary | ICD-10-CM | POA: Diagnosis present

## 2015-04-08 DIAGNOSIS — R079 Chest pain, unspecified: Secondary | ICD-10-CM | POA: Diagnosis not present

## 2015-04-08 DIAGNOSIS — N3001 Acute cystitis with hematuria: Secondary | ICD-10-CM

## 2015-04-08 DIAGNOSIS — G35 Multiple sclerosis: Secondary | ICD-10-CM

## 2015-04-08 LAB — CBC
HCT: 41.9 % (ref 39.0–52.0)
Hemoglobin: 13.3 g/dL (ref 13.0–17.0)
MCH: 30.3 pg (ref 26.0–34.0)
MCHC: 31.7 g/dL (ref 30.0–36.0)
MCV: 95.4 fL (ref 78.0–100.0)
PLATELETS: 258 10*3/uL (ref 150–400)
RBC: 4.39 MIL/uL (ref 4.22–5.81)
RDW: 13.9 % (ref 11.5–15.5)
WBC: 10 10*3/uL (ref 4.0–10.5)

## 2015-04-08 LAB — BASIC METABOLIC PANEL
Anion gap: 9 (ref 5–15)
BUN: 23 mg/dL — AB (ref 6–20)
CALCIUM: 9.3 mg/dL (ref 8.9–10.3)
CHLORIDE: 98 mmol/L — AB (ref 101–111)
CO2: 33 mmol/L — ABNORMAL HIGH (ref 22–32)
CREATININE: 1.02 mg/dL (ref 0.61–1.24)
GFR calc non Af Amer: >60 mL/min (ref >60)
Glucose, Bld: 99 mg/dL (ref 65–99)
Potassium: 3.9 mmol/L (ref 3.5–5.1)
SODIUM: 140 mmol/L (ref 135–145)

## 2015-04-08 MED ORDER — SULFAMETHOXAZOLE-TRIMETHOPRIM 800-160 MG PO TABS: 1.0000 | ORAL_TABLET | Freq: Two times a day (BID) | ORAL | Status: AC

## 2015-04-08 MED ORDER — SULFAMETHOXAZOLE-TRIMETHOPRIM 800-160 MG PO TABS: 1.0000 | ORAL_TABLET | Freq: Two times a day (BID) | ORAL | Status: DC

## 2015-04-08 MED ORDER — MUPIROCIN 2 % EX OINT: 1.0000 "application " | TOPICAL_OINTMENT | Freq: Two times a day (BID) | CUTANEOUS | Status: DC

## 2015-04-08 MED ORDER — CHLORHEXIDINE GLUCONATE CLOTH 2 % EX PADS
6.0000 | MEDICATED_PAD | Freq: Every day | CUTANEOUS | Status: DC
  Administered 2015-04-08: 6 via TOPICAL

## 2015-04-08 MED ORDER — OXYCODONE-ACETAMINOPHEN 5-325 MG PO TABS: 2.0000 | ORAL_TABLET | Freq: Four times a day (QID) | ORAL | Status: DC | PRN

## 2015-04-08 NOTE — Discharge Summary (Signed)
Physician Discharge Summary  Dale Shields [id number] DOB: 08-Nov-1950 DOA: 04/06/2015  PCP: Dale Fraise, MD  Admit date: 04/06/2015 Discharge date: 04/08/2015  Time spent: 25 minutes  Recommendations for Outpatient Follow-up:  1. Discharge home with outpatient PCP follow-up. Patient will complete antibiotic course on 04/13/2015   Discharge Diagnoses:  Principal Problem:   Atypical chest pain   Active Problems:   Chronic pain syndrome   Multiple sclerosis (HCC)   CAD (coronary artery disease)   Chest pain, rule out acute myocardial infarction   Chest pain   UTI (urinary tract infection)   Discharge Condition: Fair  Diet recommendation: Heart healthy  Filed Weights   04/06/15 1240 04/06/15 1828  Weight: 90.719 kg (200 lb) 97.1 kg (214 lb 1.1 oz)    History of present illness:  Please refer to admission H&P for details, and because 64 year old male with history of CAD with history of stent to D1 and RCA in 2013, multiple sclerosis, with chronic indwelling Foley presented with chest tightness , worsened with movement without any aggravating or relieving factors. He reported increased shortness of breath, denies any fever, chills, palpitations, nausea, vomiting, abdominal pain, bowel or urinary symptoms. He has multiple sclerosis and fibromyalgia and is on fentanyl patch chronically.  admitted to hospice service for ACS rule out.  Hospital Course:  Atypical chest pain Appears to be musculoskeletal. She does troponin negative. EKG unremarkable. Seen by cardiology and suggest this is noncardiac. Recommend to continue his aspirin, statin and beta blocker and follow up with his cardiologist. Has his appointment with Dr. Einar Gip next week.  I have recommended him to continue the fentanyl patch and prescribed him a short course of oxycodone as needed.  COPD Stable.   chronic pain syndrome Follows up in clinic. Continue fentanyl patch.  Essential hypertension stable.  Continue home medications  Coronary artery disease Continue aspirin, statin and beta blocker. History of stent. Follows with Dr. Einar Gip  UTI with chronic dwelling Foley catheter Patient complains of dysuria. He recently history of UTI. Prior culture growing Proteus and MRSA. (Both sensitive to Bactrim). Urine culture so far without any growth. Will discharge him on 5 more days of oral Bactrim.    diet: Heart healthy   CODE STATUS: Full code   Family communication: None at bedside Disposition: Home  Procedures:  None  Consultations:  Cardiology  Discharge Exam: Filed Vitals:   04/07/15 2030 04/08/15 0646  BP: 183/76 146/87  Pulse: 66 63  Temp: 98.2 F (36.8 C) 98.1 F (36.7 C)  Resp: 20 20    General: Elderly male not in distress Chief: Moist mucosa, supple neck Chest: Clear to auscultation bilaterally CVS: Normal S1 and S2, no murmurs rubs or gallop GI: Soft, nondistended, nontender, bowel sounds present, chronic indwelling Foley Musculoskeletal: Warm, no edema CNS: Alert and oriented Discharge Instructions    Current Discharge Medication List    START taking these medications   Details  oxyCODONE-acetaminophen (ROXICET) 5-325 MG tablet Take 2 tablets by mouth every 6 (six) hours as needed for severe pain. Qty: 20 tablet, Refills: 0    sulfamethoxazole-trimethoprim (BACTRIM DS) 800-160 MG tablet Take 1 tablet by mouth 2 (two) times daily. Qty: 10 tablet, Refills: 0      CONTINUE these medications which have NOT CHANGED   Details  albuterol (PROVENTIL) (2.5 MG/3ML) 0.083% nebulizer solution Take 2.5 mg by nebulization every 6 (six) hours as needed for wheezing or shortness of breath.    APPLE CIDER VINEGAR PO Take 1 tablet  by mouth daily.    Armodafinil (NUVIGIL) 250 MG tablet Take 1 tablet (250 mg total) by mouth every morning. For symptoms of MS Qty: 14 tablet, Refills: 0   Associated Diagnoses: Narcolepsy    aspirin 81 MG chewable tablet Chew 81 mg  by mouth daily.     atorvastatin (LIPITOR) 10 MG tablet Take 10 mg by mouth daily.    b complex vitamins tablet Take 1 tablet by mouth daily.    buPROPion (WELLBUTRIN SR) 150 MG 12 hr tablet Take 150 mg by mouth 2 (two) times daily.    carvedilol (COREG) 6.25 MG tablet Take 6.25 mg by mouth 2 (two) times daily.     Cholecalciferol (VITAMIN D PO) Take 1 tablet by mouth daily.    Chromium 200 MCG CAPS Take 1 capsule by mouth daily.     clonazePAM (KLONOPIN) 0.5 MG tablet 0.5 tab q am and 1 tab each evening as needed for anxiety Qty: 45 tablet, Refills: 0    DULoxetine (CYMBALTA) 60 MG capsule Take 1 capsule (60 mg total) by mouth 2 (two) times daily. Qty: 60 capsule, Refills: 5    fentaNYL (DURAGESIC - DOSED MCG/HR) 100 MCG/HR Place 175 mcg onto the skin every 3 (three) days. Uses with 75 mcg patch to total 175 mcg.    Fish Oil-Cholecalciferol (FISH OIL + D3 PO) Take 1 capsule by mouth daily.     Flaxseed, Linseed, (FLAXSEED OIL) 1000 MG CAPS Take 1 capsule by mouth daily.     Fluticasone-Salmeterol (ADVAIR) 250-50 MCG/DOSE AEPB Inhale 1 puff into the lungs 2 (two) times daily.    levalbuterol (XOPENEX HFA) 45 MCG/ACT inhaler Inhale 2 puffs into the lungs every 4 (four) hours as needed for wheezing.    Misc Natural Products (RED WINE COMPLEX PO) Take 1 capsule by mouth daily.    Multiple Vitamins-Minerals (THEREMS-M) TABS Take 1 tablet by mouth daily.    nitroGLYCERIN (NITROSTAT) 0.3 MG SL tablet Place 0.4 mg under the tongue every 5 (five) minutes as needed for chest pain.    VITAMIN A PO Take 1 tablet by mouth daily.    vitamin C (ASCORBIC ACID) 500 MG tablet Take 1,000 mg by mouth 2 (two) times daily.     VITAMIN E PO Take 1 tablet by mouth daily.      STOP taking these medications     fentaNYL (DURAGESIC - DOSED MCG/HR) 75 MCG/HR        Allergies  Allergen Reactions  . Ultram [Tramadol Hcl] Other (See Comments)    Seizures; "grand mal; twice"   Follow-up  Information    Follow up with Dale Fraise, MD On 04/15/2015.   Specialty:  Family Medicine   Why:  at 2:55 pm   Contact information:   [place] [id number] [phone]        The results of significant diagnostics from this hospitalization (including imaging, microbiology, ancillary and laboratory) are listed below for reference.    Significant Diagnostic Studies: Dg Chest 1 View  04/06/2015  CLINICAL DATA:  Left-sided chest pain.  Weakness. EXAM: CHEST 1 VIEW COMPARISON:  PA and lateral chest 11/15/2014 in 09/29/2010. FINDINGS: The lungs are clear. Heart size is normal. No pneumothorax or pleural effusion. No focal bony abnormality. IMPRESSION: No acute disease. Electronically Signed   By: Inge Rise M.D.   On: 04/06/2015 14:40    Microbiology: Recent Results (from the past 240 hour(s))  Urine culture     Status: None (Preliminary  result)   Collection Time: 04/06/15  4:57 PM  Result Value Ref Range Status   Specimen Description URINE, CLEAN CATCH  Final   Special Requests NONE  Final   Culture   Final    CULTURE REINCUBATED FOR BETTER GROWTH Performed at [facility]    Report Status PENDING  Incomplete  MRSA PCR Screening     Status: Abnormal   Collection Time: 04/07/15  2:38 PM  Result Value Ref Range Status   MRSA by PCR POSITIVE (A) NEGATIVE Final    Comment:        The GeneXpert MRSA Assay (FDA approved for NASAL specimens only), is one component of a comprehensive MRSA colonization surveillance program. It is not intended to diagnose MRSA infection nor to guide or monitor treatment for MRSA infections. RESULT CALLED TO, READ BACK BY AND VERIFIED WITH: GARZON,S. AT 1830 ON 04/07/2015 BY BAUGHAM,M.      Labs: Basic Metabolic Panel:  Recent Labs Lab 04/06/15 1328 04/08/15 0553  NA 137 140  K 4.3 3.9  CL 99* 98*  CO2 30 33*  GLUCOSE 123* 99  BUN 27* 23*  CREATININE 1.04 1.02  CALCIUM 9.2 9.3   Liver Function Tests: No  results for input(s): AST, ALT, ALKPHOS, BILITOT, PROT, ALBUMIN in the last 168 hours. No results for input(s): LIPASE, AMYLASE in the last 168 hours. No results for input(s): AMMONIA in the last 168 hours. CBC:  Recent Labs Lab 04/06/15 1328 04/08/15 0553  WBC 9.0 10.0  NEUTROABS 6.4  --   HGB 11.9* 13.3  HCT 37.2* 41.9  MCV 94.9 95.4  PLT 251 258   Cardiac Enzymes:  Recent Labs Lab 04/06/15 1328 04/06/15 1549 04/06/15 2030 04/06/15 2317 04/07/15 0215  CKTOTAL  --  94  --   --   --   TROPONINI <0.03 <0.03 <0.03 <0.03 <0.03   BNP: BNP (last 3 results) No results for input(s): BNP in the last 8760 hours.  ProBNP (last 3 results) No results for input(s): PROBNP in the last 8760 hours.  CBG: No results for input(s): GLUCAP in the last 168 hours.     Signed:  Louellen Molder MD  FACP  [facility] 04/08/2015, 2:07 PM

## 2015-04-08 NOTE — Care Management Note (Signed)
Case Management Note  Patient Details  Name: Dale Shields MRN: [id number] Date of Birth: 12-11-1950  Subjective/Objective:                    Action/Plan:   Expected Discharge Date:  04/08/15               Expected Discharge Plan:  Home/Self Care  In-House Referral:  NA  Discharge planning Services  CM Consult  Post Acute Care Choice:  NA Choice offered to:  NA  DME Arranged:    DME Agency:     HH Arranged:    [place] Agency:     Status of Service:  Completed, signed off  Medicare Important Message Given:    Date Medicare IM Given:    Medicare IM give by:    Date Additional Medicare IM Given:    Additional Medicare Important Message give by:     If discussed at [place] of Stay Meetings, dates discussed:    Additional Comments: Pt to discharged home today. Pt stated that [facility] comes monthly to change foley. Pt stated his aide is private duty that he pays for. Pt does not want CM to call [facility] to notify them of discharge. Pts nurse aware of discharge arrangements. Christinia Gully [place], RN 04/08/2015, 11:48 AM

## 2015-04-08 NOTE — Telephone Encounter (Signed)
Please review and advise.

## 2015-04-09 ENCOUNTER — Other Ambulatory Visit: Payer: Self-pay | Admitting: Family Medicine

## 2015-04-09 MED ORDER — CLONAZEPAM 0.5 MG PO TABS: 0.5000 mg | ORAL_TABLET | Freq: Two times a day (BID) | ORAL | Status: DC | PRN

## 2015-04-09 NOTE — Telephone Encounter (Signed)
Last filled 02/18/2015

## 2015-04-09 NOTE — Telephone Encounter (Signed)
Last filled 03/12/15, last seen 02/18/15. Call in at [facility] [phone]

## 2015-04-10 LAB — URINE CULTURE: Culture: >=100000

## 2015-04-15 ENCOUNTER — Ambulatory Visit: Payer: Medicare Other | Admitting: Family Medicine

## 2015-04-15 ENCOUNTER — Telehealth: Payer: Self-pay | Admitting: Family Medicine

## 2015-04-15 ENCOUNTER — Other Ambulatory Visit: Payer: Self-pay | Admitting: Family Medicine

## 2015-04-15 NOTE — Telephone Encounter (Signed)
Go ahead and give him 10 pills to last him until his appointment.

## 2015-04-15 NOTE — Telephone Encounter (Signed)
RX called into [facility]

## 2015-04-15 NOTE — Telephone Encounter (Signed)
Please review and advise Pt last seen 02/18/2015 Had appt today but rescheduled for 04/17/2015

## 2015-04-15 NOTE — Telephone Encounter (Signed)
Refill denied- NTBS 

## 2015-04-15 NOTE — Telephone Encounter (Signed)
Please review and advise.

## 2015-04-15 NOTE — Telephone Encounter (Signed)
This patient has an appointment with you on January 9.  Refill?

## 2015-04-17 ENCOUNTER — Ambulatory Visit: Payer: Medicare Other | Admitting: Family Medicine

## 2015-04-17 NOTE — Telephone Encounter (Signed)
IT looks like it was refused by [facility], was he notified

## 2015-04-18 ENCOUNTER — Other Ambulatory Visit: Payer: Self-pay | Admitting: Family Medicine

## 2015-04-20 ENCOUNTER — Ambulatory Visit: Payer: Medicare Other | Admitting: Family Medicine

## 2015-04-20 NOTE — Telephone Encounter (Signed)
Last seen 02/17/15  Dr Livia Snellen   If approved route to nurse to call into [facility]  [phone]

## 2015-04-20 NOTE — Telephone Encounter (Signed)
Please review and advise.

## 2015-04-21 ENCOUNTER — Telehealth: Payer: Self-pay | Admitting: Family Medicine

## 2015-04-21 DIAGNOSIS — G47419 Narcolepsy without cataplexy: Secondary | ICD-10-CM

## 2015-04-21 MED ORDER — ARMODAFINIL 250 MG PO TABS: 250.0000 mg | ORAL_TABLET | Freq: Every morning | ORAL | Status: DC

## 2015-04-21 NOTE — Telephone Encounter (Signed)
14 pills authorized

## 2015-04-21 NOTE — Telephone Encounter (Signed)
Nuvigil called into [facility]. Okayed per Dr Livia Snellen Pt notified

## 2015-04-27 ENCOUNTER — Ambulatory Visit: Payer: Medicare Other | Admitting: Family Medicine

## 2015-04-28 ENCOUNTER — Ambulatory Visit: Payer: Medicare Other | Admitting: Family Medicine

## 2015-06-09 ENCOUNTER — Telehealth: Payer: Self-pay | Admitting: Neurology

## 2015-06-09 ENCOUNTER — Ambulatory Visit (INDEPENDENT_AMBULATORY_CARE_PROVIDER_SITE_OTHER): Payer: Medicare Other | Admitting: Family Medicine

## 2015-06-09 DIAGNOSIS — G35 Multiple sclerosis: Secondary | ICD-10-CM | POA: Diagnosis not present

## 2015-06-09 DIAGNOSIS — G894 Chronic pain syndrome: Secondary | ICD-10-CM

## 2015-06-09 DIAGNOSIS — Z466 Encounter for fitting and adjustment of urinary device: Secondary | ICD-10-CM

## 2015-06-09 DIAGNOSIS — N319 Neuromuscular dysfunction of bladder, unspecified: Secondary | ICD-10-CM

## 2015-06-09 NOTE — Telephone Encounter (Signed)
FYI per the pat.  His pain doc has change his med and it is causing adverse reactions.His  Balance is off a little bit.  His pain doctor is wanting him to see a neurologist that she knows, But he isn't going.  He believes he is almost off the pain meds.

## 2015-06-09 NOTE — Telephone Encounter (Signed)
Please see message from [place].

## 2015-06-09 NOTE — Telephone Encounter (Signed)
What exactly is the question?

## 2015-06-23 ENCOUNTER — Encounter: Payer: Self-pay | Admitting: Family Medicine

## 2015-07-10 ENCOUNTER — Other Ambulatory Visit: Payer: Self-pay | Admitting: Family Medicine

## 2015-07-10 NOTE — Telephone Encounter (Signed)
Please review and advise.

## 2015-07-10 NOTE — Telephone Encounter (Signed)
Last filled 04/21/15, last seen 02/18/15, call in Rx

## 2015-08-09 ENCOUNTER — Ambulatory Visit (INDEPENDENT_AMBULATORY_CARE_PROVIDER_SITE_OTHER): Payer: Medicare Other | Admitting: Family Medicine

## 2015-08-09 DIAGNOSIS — G35 Multiple sclerosis: Secondary | ICD-10-CM

## 2015-08-09 DIAGNOSIS — N319 Neuromuscular dysfunction of bladder, unspecified: Secondary | ICD-10-CM | POA: Diagnosis not present

## 2015-08-09 DIAGNOSIS — Z466 Encounter for fitting and adjustment of urinary device: Secondary | ICD-10-CM | POA: Diagnosis not present

## 2015-08-09 DIAGNOSIS — G894 Chronic pain syndrome: Secondary | ICD-10-CM | POA: Diagnosis not present

## 2015-08-18 ENCOUNTER — Other Ambulatory Visit: Payer: Self-pay | Admitting: Family Medicine

## 2015-09-03 ENCOUNTER — Other Ambulatory Visit: Payer: Self-pay | Admitting: Family Medicine

## 2015-09-28 ENCOUNTER — Telehealth: Payer: Self-pay | Admitting: Family Medicine

## 2015-09-28 DIAGNOSIS — F329 Major depressive disorder, single episode, unspecified: Secondary | ICD-10-CM

## 2015-09-28 DIAGNOSIS — F32A Depression, unspecified: Secondary | ICD-10-CM

## 2015-09-28 NOTE — Telephone Encounter (Signed)
Go ahead and do psychiatry referral and get the diagnosis based off Dr. Livia Snellen

## 2015-09-28 NOTE — Telephone Encounter (Signed)
Patient aware and referral placed.

## 2015-09-28 NOTE — Telephone Encounter (Signed)
Please advise  No psychiatry referral placed  Dr Livia Snellen PCP

## 2015-10-01 ENCOUNTER — Other Ambulatory Visit: Payer: Self-pay | Admitting: Family Medicine

## 2015-10-02 NOTE — Telephone Encounter (Signed)
Last seen 02/18/15 Dr Livia Snellen  If approved route to nurse to call into [facility]  [phone]

## 2015-10-05 ENCOUNTER — Other Ambulatory Visit: Payer: Self-pay | Admitting: Family Medicine

## 2015-10-06 NOTE — Telephone Encounter (Signed)
rx called into pharmacy

## 2015-10-07 ENCOUNTER — Telehealth (HOSPITAL_COMMUNITY): Payer: Self-pay | Admitting: *Deleted

## 2015-10-07 NOTE — Telephone Encounter (Signed)
left voice message regarding an appointment. 

## 2015-10-09 ENCOUNTER — Ambulatory Visit: Payer: Medicare Other | Admitting: Family Medicine

## 2015-11-12 ENCOUNTER — Telehealth: Payer: Self-pay

## 2015-11-16 ENCOUNTER — Other Ambulatory Visit: Payer: Self-pay | Admitting: Family Medicine

## 2015-11-16 NOTE — Telephone Encounter (Signed)
x

## 2015-11-17 NOTE — Telephone Encounter (Signed)
Last refill without being seen 

## 2015-12-03 ENCOUNTER — Telehealth: Payer: Self-pay | Admitting: Family Medicine

## 2015-12-03 NOTE — Telephone Encounter (Signed)
FYI

## 2015-12-09 ENCOUNTER — Ambulatory Visit (HOSPITAL_COMMUNITY): Payer: Self-pay | Admitting: Psychiatry

## 2015-12-10 ENCOUNTER — Other Ambulatory Visit: Payer: Self-pay | Admitting: Nurse Practitioner

## 2015-12-17 ENCOUNTER — Ambulatory Visit (INDEPENDENT_AMBULATORY_CARE_PROVIDER_SITE_OTHER): Payer: Medicare Other | Admitting: Psychiatry

## 2015-12-17 ENCOUNTER — Encounter (HOSPITAL_COMMUNITY): Payer: Self-pay | Admitting: Psychiatry

## 2015-12-17 VITALS — BP 164/95 | HR 83 | Ht 70.0 in

## 2015-12-17 DIAGNOSIS — F331 Major depressive disorder, recurrent, moderate: Secondary | ICD-10-CM

## 2015-12-17 MED ORDER — ARIPIPRAZOLE 2 MG PO TABS: 2.0000 mg | ORAL_TABLET | Freq: Every day | ORAL | 2 refills | Status: DC

## 2015-12-17 NOTE — Patient Instructions (Signed)
1. Start Abilify 2 mg daily  2. Return to clinic in one month 3. Recommend talking with your doctor about physical therapy
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
## 2015-12-21 ENCOUNTER — Telehealth (HOSPITAL_COMMUNITY): Payer: Self-pay | Admitting: *Deleted

## 2015-12-21 MED ORDER — HYDROXYZINE HCL 10 MG PO TABS: 25.0000 mg | ORAL_TABLET | Freq: Three times a day (TID) | ORAL | 0 refills | Status: DC | PRN

## 2015-12-21 NOTE — Telephone Encounter (Signed)
noted 

## 2015-12-21 NOTE — Telephone Encounter (Signed)
voice message from patient.  He said he is very anxious today.  He said you discussed something for anxiety and he would like something called in to his pharmacy.

## 2015-12-21 NOTE — Telephone Encounter (Signed)
Called the patient. Ordered prescription/hydroxyzyine.

## 2015-12-21 NOTE — Telephone Encounter (Signed)
Pt called stating he would like to start something for his Anxiety. Per pt he is very anxious. Pt number is [phone]

## 2015-12-21 NOTE — Telephone Encounter (Signed)
Received a phone call from patient.  Patient reports occasional intense anxiety. Did not notice any side effect since starting Abilify.  Will start hydroxyzine 25 mg q8hprn for anxiety.

## 2015-12-28 ENCOUNTER — Other Ambulatory Visit: Payer: Self-pay | Admitting: Nurse Practitioner

## 2016-01-05 ENCOUNTER — Telehealth (HOSPITAL_COMMUNITY): Payer: Self-pay | Admitting: *Deleted
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
## 2016-01-09 ENCOUNTER — Other Ambulatory Visit: Payer: Self-pay | Admitting: Nurse Practitioner

## 2016-01-11 ENCOUNTER — Emergency Department (HOSPITAL_COMMUNITY): Payer: Medicare Other

## 2016-01-11 ENCOUNTER — Observation Stay (HOSPITAL_COMMUNITY)
Admission: EM | Admit: 2016-01-11 | Discharge: 2016-01-12 | Disposition: A | Discharge status: Home / Self Care | Payer: Medicare Other | Attending: Internal Medicine | Admitting: Internal Medicine

## 2016-01-11 ENCOUNTER — Encounter (HOSPITAL_COMMUNITY): Payer: Self-pay

## 2016-01-11 DIAGNOSIS — R079 Chest pain, unspecified: Principal | ICD-10-CM | POA: Insufficient documentation

## 2016-01-11 DIAGNOSIS — I10 Essential (primary) hypertension: Secondary | ICD-10-CM | POA: Insufficient documentation

## 2016-01-11 DIAGNOSIS — F329 Major depressive disorder, single episode, unspecified: Secondary | ICD-10-CM | POA: Diagnosis present

## 2016-01-11 DIAGNOSIS — Z87891 Personal history of nicotine dependence: Secondary | ICD-10-CM | POA: Diagnosis not present

## 2016-01-11 DIAGNOSIS — Z79899 Other long term (current) drug therapy: Secondary | ICD-10-CM | POA: Insufficient documentation

## 2016-01-11 DIAGNOSIS — J449 Chronic obstructive pulmonary disease, unspecified: Secondary | ICD-10-CM | POA: Insufficient documentation

## 2016-01-11 DIAGNOSIS — F32A Depression, unspecified: Secondary | ICD-10-CM | POA: Diagnosis present

## 2016-01-11 DIAGNOSIS — E039 Hypothyroidism, unspecified: Secondary | ICD-10-CM | POA: Insufficient documentation

## 2016-01-11 DIAGNOSIS — E785 Hyperlipidemia, unspecified: Secondary | ICD-10-CM | POA: Diagnosis present

## 2016-01-11 DIAGNOSIS — I2510 Atherosclerotic heart disease of native coronary artery without angina pectoris: Secondary | ICD-10-CM | POA: Insufficient documentation

## 2016-01-11 DIAGNOSIS — Z7982 Long term (current) use of aspirin: Secondary | ICD-10-CM | POA: Diagnosis not present

## 2016-01-11 DIAGNOSIS — G894 Chronic pain syndrome: Secondary | ICD-10-CM | POA: Diagnosis present

## 2016-01-11 DIAGNOSIS — F419 Anxiety disorder, unspecified: Secondary | ICD-10-CM | POA: Diagnosis present

## 2016-01-11 LAB — BASIC METABOLIC PANEL
Anion gap: 7 (ref 5–15)
BUN: 23 mg/dL — ABNORMAL HIGH (ref 6–20)
CHLORIDE: 102 mmol/L (ref 101–111)
CO2: 26 mmol/L (ref 22–32)
CREATININE: 1.26 mg/dL — AB (ref 0.61–1.24)
Calcium: 9.4 mg/dL (ref 8.9–10.3)
GFR calc non Af Amer: 59 mL/min — ABNORMAL LOW (ref >60)
Glucose, Bld: 118 mg/dL — ABNORMAL HIGH (ref 65–99)
POTASSIUM: 4.3 mmol/L (ref 3.5–5.1)
SODIUM: 135 mmol/L (ref 135–145)

## 2016-01-11 LAB — CBC
HEMATOCRIT: 40.5 % (ref 39.0–52.0)
Hemoglobin: 13 g/dL (ref 13.0–17.0)
MCH: 30.6 pg (ref 26.0–34.0)
MCHC: 32.1 g/dL (ref 30.0–36.0)
MCV: 95.3 fL (ref 78.0–100.0)
PLATELETS: 363 10*3/uL (ref 150–400)
RBC: 4.25 MIL/uL (ref 4.22–5.81)
RDW: 14.2 % (ref 11.5–15.5)
WBC: 12.1 10*3/uL — AB (ref 4.0–10.5)

## 2016-01-11 LAB — TROPONIN I

## 2016-01-11 LAB — I-STAT TROPONIN, ED: Troponin i, poc: 0 ng/mL (ref 0.00–0.08)

## 2016-01-11 MED ORDER — ENOXAPARIN SODIUM 40 MG/0.4ML ~~LOC~~ SOLN
40.0000 mg | SUBCUTANEOUS | Status: DC
  Administered 2016-01-11: 40 mg via SUBCUTANEOUS
  Filled 2016-01-11: qty 0.4

## 2016-01-11 MED ORDER — SODIUM CHLORIDE 0.9% FLUSH
3.0000 mL | Freq: Two times a day (BID) | INTRAVENOUS | Status: DC
  Administered 2016-01-12: 3 mL via INTRAVENOUS

## 2016-01-11 MED ORDER — HYDROXYZINE HCL 25 MG PO TABS: 25.0000 mg | ORAL_TABLET | Freq: Four times a day (QID) | ORAL | Status: DC | PRN

## 2016-01-11 MED ORDER — ASPIRIN 81 MG PO CHEW
324.0000 mg | CHEWABLE_TABLET | Freq: Once | ORAL | Status: AC
  Administered 2016-01-11: 324 mg via ORAL
  Filled 2016-01-11: qty 4

## 2016-01-11 MED ORDER — BUPRENORPHINE HCL 2 MG SL SUBL
12.0000 mg | SUBLINGUAL_TABLET | Freq: Every day | SUBLINGUAL | Status: DC
  Administered 2016-01-11 – 2016-01-12 (×2): 12 mg via SUBLINGUAL
  Filled 2016-01-11 (×2): qty 6

## 2016-01-11 MED ORDER — ASPIRIN 81 MG PO CHEW
81.0000 mg | CHEWABLE_TABLET | Freq: Every day | ORAL | Status: DC
  Administered 2016-01-12: 81 mg via ORAL
  Filled 2016-01-11: qty 1

## 2016-01-11 MED ORDER — METHADONE HCL 5 MG PO TABS: 3.0000 mg | ORAL_TABLET | Freq: Every day | ORAL | Status: DC

## 2016-01-11 MED ORDER — MOMETASONE FURO-FORMOTEROL FUM 200-5 MCG/ACT IN AERO
INHALATION_SPRAY | RESPIRATORY_TRACT | Status: AC
  Filled 2016-01-11: qty 8.8

## 2016-01-11 MED ORDER — MOMETASONE FURO-FORMOTEROL FUM 200-5 MCG/ACT IN AERO
2.0000 | INHALATION_SPRAY | Freq: Two times a day (BID) | RESPIRATORY_TRACT | Status: DC
  Administered 2016-01-12: 2 via RESPIRATORY_TRACT
  Filled 2016-01-11: qty 8.8

## 2016-01-11 MED ORDER — ARMODAFINIL 250 MG PO TABS
1.0000 | ORAL_TABLET | Freq: Every day | ORAL | Status: DC
  Filled 2016-01-11 (×2): qty 1

## 2016-01-11 MED ORDER — ACETAMINOPHEN 325 MG PO TABS: 650.0000 mg | ORAL_TABLET | ORAL | Status: DC | PRN

## 2016-01-11 MED ORDER — CLONIDINE HCL 0.1 MG PO TABS
0.1000 mg | ORAL_TABLET | Freq: Every day | ORAL | Status: DC
  Administered 2016-01-11: 0.1 mg via ORAL
  Filled 2016-01-11: qty 1

## 2016-01-11 MED ORDER — ATORVASTATIN CALCIUM 10 MG PO TABS
10.0000 mg | ORAL_TABLET | Freq: Every day | ORAL | Status: DC
  Administered 2016-01-11: 10 mg via ORAL
  Filled 2016-01-11: qty 1

## 2016-01-11 MED ORDER — ALBUTEROL SULFATE (2.5 MG/3ML) 0.083% IN NEBU: 2.5000 mg | INHALATION_SOLUTION | Freq: Four times a day (QID) | RESPIRATORY_TRACT | Status: DC | PRN

## 2016-01-11 MED ORDER — NITROGLYCERIN 0.4 MG SL SUBL: 0.4000 mg | SUBLINGUAL_TABLET | SUBLINGUAL | Status: DC | PRN

## 2016-01-11 MED ORDER — IBUPROFEN 800 MG PO TABS
800.0000 mg | ORAL_TABLET | Freq: Once | ORAL | Status: AC
  Administered 2016-01-11: 800 mg via ORAL
  Filled 2016-01-11: qty 1

## 2016-01-11 MED ORDER — CARVEDILOL 3.125 MG PO TABS
6.2500 mg | ORAL_TABLET | Freq: Two times a day (BID) | ORAL | Status: DC
  Administered 2016-01-11 – 2016-01-12 (×2): 6.25 mg via ORAL
  Filled 2016-01-11 (×2): qty 2

## 2016-01-11 MED ORDER — ARIPIPRAZOLE 2 MG PO TABS
ORAL_TABLET | ORAL | Status: AC
  Filled 2016-01-11: qty 1

## 2016-01-11 MED ORDER — ONDANSETRON HCL 4 MG/2ML IJ SOLN: 4.0000 mg | Freq: Four times a day (QID) | INTRAMUSCULAR | Status: DC | PRN

## 2016-01-11 MED ORDER — ARIPIPRAZOLE 2 MG PO TABS
2.0000 mg | ORAL_TABLET | Freq: Every day | ORAL | Status: DC
  Administered 2016-01-11 – 2016-01-12 (×2): 2 mg via ORAL
  Filled 2016-01-11 (×3): qty 1

## 2016-01-11 MED ORDER — DULOXETINE HCL 60 MG PO CPEP
60.0000 mg | ORAL_CAPSULE | Freq: Two times a day (BID) | ORAL | Status: DC
  Administered 2016-01-11 – 2016-01-12 (×2): 60 mg via ORAL
  Filled 2016-01-11 (×2): qty 1

## 2016-01-11 NOTE — ED Triage Notes (Signed)
Complain of left sided chest pain . Worse with movement

## 2016-01-11 NOTE — ED Provider Notes (Signed)
[facility] DEPT Provider Note   CSN: [id number] Arrival date & time: 01/11/16  1201     History   Chief Complaint Chief Complaint  Patient presents with  . Chest Pain    HPI Dale Shields is a 65 y.o. male.  Patient is a 65 year old male, has a history of cardiac disease, coronary disease, stenting in 2013 as well as a history of COPD, history of endocarditis, history of necrotizing fasciitis in 2012 status post large area resection with skin grafting. He does have an element of chronic pain related to these prior surgical events and what he describes as fibromyalgia in his medical history. He had been started on gabapentin in the past during a hospitalization after his skin grafting, it caused an adverse reaction, he stopped taking it immediately. He was prescribed this again in generic form approximately 12 days ago and shortly thereafter developed some delirium and some hallucinations. He stopped the medications and the symptoms went away. He was at his neurology office today to discuss this as well as his need to get off of opiates when he developed some recurrent left shoulder chest pain and shortness of breath which she states was present last night but went away and came back again today. He states that his symptoms are now mild, they have eased off. He does report getting dyspneic on exertion occasionally but it usually goes away quickly but today continued. He follows with Dr. Einar Gip of cardiology, he is not sure his exact last appointment or his last time he had a stress test. There is no record in the computer to suggest a recent stress test. He was last admitted to the hospital in December 2016 when he underwent a cardiac rule out, was seen by cardiology and has pain at that time was thought to be noncardiac.    Chest Pain      Past Medical History:  Diagnosis Date  . Anemia of chronic disease   . Anxiety   . Anxiety and depression   . BPH (benign prostatic  hypertrophy)   . CAD (coronary artery disease) 11/2011   Stents x 3  . Carpal tunnel syndrome   . COPD (chronic obstructive pulmonary disease) ([place])   . DDD (degenerative disc disease)    knees, hips, shoulders--Dr. Ronnie Derby [facility] prior to this)  . Depression   . Endocarditis 2012  . Exertional dyspnea   . Fibromyalgia   . GERD (gastroesophageal reflux disease)   . H/O hiatal hernia   . Heart murmur   . History of blood transfusion    "gave me too much coumadin and heparin once"  . History of epistaxis   . History of gout   . History of MRSA infection 2012   UTI, bacteremia, and endocarditis  . History of renal failure 2011   "on dialysis 3 times over 2 years"  . HLD (hyperlipidemia)   . Hydronephrosis   . Hypertension   . Hypothyroidism   . Insomnia   . Multiple sclerosis ([place])    with optic neuritis  . Myocardial infarction    "according to stress test; that's news to me"  . Narcolepsy   . Necrotizing fasciitis ([place])   . Neurogenic bladder    chronic indwelling foley catheter-changed monthly  . Optic neuritis   . OSA (obstructive sleep apnea)    "tried CPAP; lost more sleep w/use"  . Osteoarthritis of multiple joints    "everywhere"  . Pulmonary embolism ([place])    "suspected; never  confirmed"--? treated with coumadin  . Scoliosis   . Steroid-induced psychosis   . Supraspinatus tendon tear 2012  . Thrombocytopenia [facility])     Patient Active Problem List   Diagnosis Date Noted  . Atypical chest pain 04/08/2015  . UTI (urinary tract infection) 04/08/2015  . Chest pain, rule out acute myocardial infarction 04/06/2015  . Chest pain 04/06/2015  . Hydronephrosis 11/18/2014  . Catheter-associated urinary tract infection ([place])   . Malnutrition of moderate degree ([place]) 11/17/2014  . AKI (acute kidney injury) ([place]) 11/16/2014  . Hematuria 11/16/2014  . Generalized weakness 11/16/2014  . FTT (failure to thrive) in adult 11/16/2014  . Acute encephalopathy 11/16/2014  .  Anemia of chronic disease 11/16/2014  . COPD (chronic obstructive pulmonary disease) ([place]) 11/15/2014  . CAD (coronary artery disease) 11/15/2014  . Chronic pain syndrome 04/11/2013  . Multiple sclerosis ([place]) 04/11/2013    Past Surgical History:  Procedure Laterality Date  . CARPAL TUNNEL RELEASE  ~ 2010   left with radius fracture repair  . COLONOSCOPY  2000; 2005; 03/15/2011   diminutve adenoma--recall 2017  . CORONARY ANGIOPLASTY  11/22/2011   3 vessels  . CORONARY ANGIOPLASTY WITH STENT PLACEMENT  11/22/2011   DES to LAD  . KNEE ARTHROSCOPY W/ MENISCAL REPAIR  1990's   right  . LEFT HEART CATHETERIZATION WITH CORONARY ANGIOGRAM N/A 11/22/2011   Procedure: LEFT HEART CATHETERIZATION WITH CORONARY ANGIOGRAM;  Surgeon: Laverda Page, MD;  Location: [facility] CATH LAB;  Service: Cardiovascular;  Laterality: N/A;  . nasal cautery    . PERCUTANEOUS CORONARY STENT INTERVENTION (PCI-S)  11/22/2011   Procedure: PERCUTANEOUS CORONARY STENT INTERVENTION (PCI-S);  Surgeon: Laverda Page, MD;  Location: [facility] CATH LAB;  Service: Cardiovascular;;  . SKIN GRAFT  04/2009   right thigh to right "wrist all the way to my shoulder; necrotizing fasciitis       Home Medications    Prior to Admission medications   Medication Sig Start Date End Date Taking? Authorizing Provider  albuterol (PROVENTIL) (2.5 MG/3ML) 0.083% nebulizer solution Take 2.5 mg by nebulization every 6 (six) hours as needed for wheezing or shortness of breath.    Historical Provider, MD  APPLE CIDER VINEGAR PO Take 1 tablet by mouth daily.    Historical Provider, MD  ARIPiprazole (ABILIFY) 2 MG tablet Take 1 tablet (2 mg total) by mouth daily. 12/17/15 12/16/16  Norman Clay, MD  aspirin 81 MG chewable tablet Chew 81 mg by mouth daily.     Historical Provider, MD  atorvastatin (LIPITOR) 10 MG tablet Take 10 mg by mouth daily.    Historical Provider, MD  b complex vitamins tablet Take 1 tablet by mouth daily.    Historical Provider, MD    carvedilol (COREG) 6.25 MG tablet Take 6.25 mg by mouth 2 (two) times daily.     Historical Provider, MD  Cholecalciferol (VITAMIN D PO) Take 1 tablet by mouth daily.    Historical Provider, MD  Chromium 200 MCG CAPS Take 1 capsule by mouth daily.     Historical Provider, MD  clonazePAM (KLONOPIN) 0.5 MG tablet Take 1 tablet (0.5 mg total) by mouth 2 (two) times daily as needed for anxiety. Patient not taking: Reported on 12/17/2015 04/09/15   [name], MD  DULoxetine (CYMBALTA) 60 MG capsule TAKE (1) CAPSULE BY MOUTH TWICE DAILY. 11/17/15   Mary-Margaret Hassell Done, FNP  fentaNYL (DURAGESIC - DOSED MCG/HR) 100 MCG/HR Place 175 mcg onto the skin every 3 (three) days. Uses with 75  mcg patch to total 175 mcg.    Historical Provider, MD  Fish Oil-Cholecalciferol (FISH OIL + D3 PO) Take 1 capsule by mouth daily.     Historical Provider, MD  Flaxseed, Linseed, (FLAXSEED OIL) 1000 MG CAPS Take 1 capsule by mouth daily.     Historical Provider, MD  Fluticasone-Salmeterol (ADVAIR) 250-50 MCG/DOSE AEPB Inhale 1 puff into the lungs 2 (two) times daily.    Historical Provider, MD  hydrOXYzine (ATARAX/VISTARIL) 10 MG tablet Take 2.5 tablets (25 mg total) by mouth 3 (three) times daily as needed for anxiety. 12/21/15 01/20/16  Norman Clay, MD  levalbuterol [facility] HFA) 45 MCG/ACT inhaler Inhale 2 puffs into the lungs every 4 (four) hours as needed for wheezing.    Historical Provider, MD  Misc Natural Products (RED WINE COMPLEX PO) Take 1 capsule by mouth daily.    Historical Provider, MD  Multiple Vitamins-Minerals (THEREMS-M) TABS Take 1 tablet by mouth daily.    Historical Provider, MD  nitroGLYCERIN (NITROSTAT) 0.3 MG SL tablet Place 0.4 mg under the tongue every 5 (five) minutes as needed for chest pain.    Historical Provider, MD  NUVIGIL 250 MG tablet TAKE 1 TABLET BY MOUTH TWICE DAILY. 10/02/15   Wardell Honour, MD  oxyCODONE-acetaminophen (ROXICET) 5-325 MG tablet Take 2 tablets by mouth every 6 (six)  hours as needed for severe pain. Patient not taking: Reported on 12/17/2015 04/08/15   Nishant Dhungel, MD  VITAMIN A PO Take 1 tablet by mouth daily.    Historical Provider, MD  vitamin C (ASCORBIC ACID) 500 MG tablet Take 1,000 mg by mouth 2 (two) times daily.     Historical Provider, MD  VITAMIN E PO Take 1 tablet by mouth daily.    Historical Provider, MD    Family History Family History  Problem Relation Age of Onset  . Alzheimer's disease Mother   . Colon cancer Neg Hx     Social History Social History  Substance Use Topics  . Smoking status: Former Smoker    Packs/day: 0.00    Years: 40.00    Quit date: 08/15/2014  . Smokeless tobacco: Never Used     Comment: Counseling sheet to quit smoking given in exam room   . Alcohol use No     Comment: 11/22/2011 "like crazy til 1988; nothing since"     Allergies   Ultram [tramadol hcl]   Review of Systems Review of Systems  Cardiovascular: Positive for chest pain.  All other systems reviewed and are negative.    Physical Exam Updated Vital Signs BP 132/91 (BP Location: Left Arm)   Pulse 82   Temp 99.2 F (37.3 C) (Oral)   Resp 14   Ht 5\' 10"  (1.778 m)   Wt 200 lb (90.7 kg)   SpO2 97%   BMI 28.70 kg/m   Physical Exam  Constitutional: He appears well-developed and well-nourished. No distress.  HENT:  Head: Normocephalic and atraumatic.  Mouth/Throat: Oropharynx is clear and moist. No oropharyngeal exudate.  Eyes: Conjunctivae and EOM are normal. Pupils are equal, round, and reactive to light. Right eye exhibits no discharge. Left eye exhibits no discharge. No scleral icterus.  Neck: Normal range of motion. Neck supple. No JVD present. No thyromegaly present.  Cardiovascular: Normal rate, regular rhythm, normal heart sounds and intact distal pulses.  Exam reveals no gallop and no friction rub.   No murmur heard. Pulmonary/Chest: Effort normal and breath sounds normal. No respiratory distress. He has no wheezes. He  has no rales.  Abdominal: Soft. Bowel sounds are normal. He exhibits no distension and no mass. There is no tenderness.  Musculoskeletal: Normal range of motion. He exhibits no edema or tenderness.  There is a large area of surface area of his right arm and right chest wall which has been operated on, large area of deficit covered with skin grafting, there is minimal redness over the right shoulder, no signs of induration and warmth or swelling. No edema of the lower extremities bilaterally.  Lymphadenopathy:    He has no cervical adenopathy.  Neurological: He is alert. Coordination normal.  Skin: Skin is warm and dry. No rash noted. No erythema.  Psychiatric: He has a normal mood and affect. His behavior is normal.  Nursing note and vitals reviewed.    ED Treatments / Results  Labs (all labs ordered are listed, but only abnormal results are displayed) Labs Reviewed  BASIC METABOLIC PANEL  CBC  TROPONIN I  I-STAT TROPOININ, ED    EKG  EKG Interpretation  Date/Time:  Monday January 11 2016 12:17:25 EDT Ventricular Rate:  81 PR Interval:    QRS Duration: 106 QT Interval:  378 QTC Calculation: 439 R Axis:   71 Text Interpretation:  Sinus rhythm Normal ECG since last tracing no significant change Confirmed by Favian Kittleson  MD, Fifi Schindler ([id number]) on 01/11/2016 12:47:53 PM       Radiology No results found.  Procedures Procedures (including critical care time)  Medications Ordered in ED Medications  ibuprofen (ADVIL,MOTRIN) tablet 800 mg (not administered)     Initial Impression / Assessment and Plan / ED Course  I have reviewed the triage vital signs and the nursing notes.  Pertinent labs & imaging results that were available during my care of the patient were reviewed by me and considered in my medical decision making (see chart for details).  Clinical Course  Value Comment By Time  Troponin I (Reviewed) Noemi Chapel, MD 10/02 1502   The patient is well-appearing, his vital  signs are unremarkable, his EKG is nonischemic, his symptoms have been intermittent since last night and through this morning and somewhat exertional. There is some concern that this could be recurrent coronary disease. I will obtain a chest x-ray and lab work to rule out other sources such as pneumothorax, there are no recent cardiac notes however with his cardiac disease history and now having some exertional symptoms it would mandate an in-hospital evaluation if his cardiac enzymes are negative and cardiology consultation if they are positive.  Has neg enzymes D/w Dr. Olevia Bowens who will admit   Final Clinical Impressions(s) / ED Diagnoses   Final diagnoses:  None    New Prescriptions New Prescriptions   No medications on file     Noemi Chapel, MD 01/12/16 1243

## 2016-01-11 NOTE — ED Notes (Signed)
Report given to Cecely, RN at this time for room 307.

## 2016-01-11 NOTE — ED Notes (Signed)
Patient returned from X-ray 

## 2016-01-11 NOTE — H&P (Signed)
History and Physical    Dale Shields [id number] DOB: [birth year] DOA: 01/11/2016  PCP: [name], MD   Patient coming from: PCP's office.  Chief Complaint: Chest pain.  HPI: Dale Shields is a 65 y.o. male with medical history significant of anxiety, depression, fibromyalgia, BPH, CAD, history of MI in 2013 and the stent 3, carpal tunnel syndrome, COPD, history of endocarditis, GERD, hyperlipidemia, hypertension, hypothyroidism who is coming to the emergency department due to chest pain.  Per patient, he went to see his primary care doctor due to follow-up for pain and side effects from gabapentin. He has a previous history of chronic pain due to necrotizing fasciitis of his right shoulder and right upper extremity in 2012, for which he has been treated with multiple medications. He has been trying to get off the opioids and is currently taking Suboxone. He has a known sensitivity to gabapentin, but forgot about it until he had already restarted gabapentin. He states that his sister was telling him that he was having some delirium and hallucinations.  Today, while he was at the doctor's office, he felt worsening of his shortness of breath, which he states usually happens with exertion, but was happening even with rest, precordial and left shoulder pain, dizziness and near fainting sensation several times while sitting down at the doctor's office. He states that he was asked by the lady that was also in the waiting room while was brown and subsequently EMS was called to bring him to the hospital.  ED Course: Workup in the emergency department has shown a non-ischemic EKG and negative troponin. He is currently chest pain-free. He is going to be admitted for cardiac monitoring and troponin trending.   Review of Systems: As per HPI otherwise 10 point review of systems negative.    Past Medical History:  Diagnosis Date  . Anemia of chronic disease   . Anxiety   . Anxiety and  depression   . BPH (benign prostatic hypertrophy)   . CAD (coronary artery disease) 11/2011   Stents x 3  . Carpal tunnel syndrome   . COPD (chronic obstructive pulmonary disease) ([place])   . DDD (degenerative disc disease)    knees, hips, shoulders--Dr. Ronnie Derby [facility] prior to this)  . Depression   . Endocarditis 2012  . Exertional dyspnea   . Fibromyalgia   . GERD (gastroesophageal reflux disease)   . H/O hiatal hernia   . Heart murmur   . History of blood transfusion    "gave me too much coumadin and heparin once"  . History of epistaxis   . History of gout   . History of MRSA infection 2012   UTI, bacteremia, and endocarditis  . History of renal failure 2011   "on dialysis 3 times over 2 years"  . HLD (hyperlipidemia)   . Hydronephrosis   . Hypertension   . Hypothyroidism   . Insomnia   . Multiple sclerosis ([place])    with optic neuritis  . Myocardial infarction    "according to stress test; that's news to me"  . Narcolepsy   . Necrotizing fasciitis ([place])   . Neurogenic bladder    chronic indwelling foley catheter-changed monthly  . Optic neuritis   . OSA (obstructive sleep apnea)    "tried CPAP; lost more sleep w/use"  . Osteoarthritis of multiple joints    "everywhere"  . Pulmonary embolism ([place])    "suspected; never confirmed"--? treated with coumadin  . Scoliosis   . Steroid-induced psychosis   .  Supraspinatus tendon tear 2012  . Thrombocytopenia ([place])     Past Surgical History:  Procedure Laterality Date  . CARPAL TUNNEL RELEASE  ~ 2010   left with radius fracture repair  . COLONOSCOPY  2000; 2005; 03/15/2011   diminutve adenoma--recall 2017  . CORONARY ANGIOPLASTY  11/22/2011   3 vessels  . CORONARY ANGIOPLASTY WITH STENT PLACEMENT  11/22/2011   DES to LAD  . KNEE ARTHROSCOPY W/ MENISCAL REPAIR  1990's   right  . LEFT HEART CATHETERIZATION WITH CORONARY ANGIOGRAM N/A 11/22/2011   Procedure: LEFT HEART CATHETERIZATION WITH CORONARY ANGIOGRAM;  Surgeon:  Laverda Page, MD;  Location: [facility] CATH LAB;  Service: Cardiovascular;  Laterality: N/A;  . nasal cautery    . PERCUTANEOUS CORONARY STENT INTERVENTION (PCI-S)  11/22/2011   Procedure: PERCUTANEOUS CORONARY STENT INTERVENTION (PCI-S);  Surgeon: Laverda Page, MD;  Location: [facility] CATH LAB;  Service: Cardiovascular;;  . SKIN GRAFT  04/2009   right thigh to right "wrist all the way to my shoulder; necrotizing fasciitis     reports that he quit smoking about 16 months ago. He smoked 0.00 packs per day for 40.00 years. He has never used smokeless tobacco. He reports that he does not drink alcohol or use drugs.  Allergies  Allergen Reactions  . Ultram [Tramadol Hcl] Other (See Comments)    Seizures; "grand mal; twice"  . Gabapentin Other (See Comments)    Made pt dilusional    Family History  Problem Relation Age of Onset  . Alzheimer's disease Mother   . Colon cancer Neg Hx     Prior to Admission medications   Medication Sig Start Date End Date Taking? Authorizing Provider  albuterol (PROVENTIL) (2.5 MG/3ML) 0.083% nebulizer solution Take 2.5 mg by nebulization every 6 (six) hours as needed for wheezing or shortness of breath.   Yes Historical Provider, MD  ARIPiprazole (ABILIFY) 2 MG tablet Take 1 tablet (2 mg total) by mouth daily. 12/17/15 12/16/16 Yes Norman Clay, MD  aspirin 81 MG chewable tablet Chew 81 mg by mouth daily.    Yes Historical Provider, MD  b complex vitamins tablet Take 1 tablet by mouth daily.   Yes Historical Provider, MD  Buprenorphine HCl-Naloxone HCl (SUBOXONE) 12-3 MG FILM Place 1 Film under the tongue daily.   Yes Historical Provider, MD  carvedilol (COREG) 6.25 MG tablet Take 6.25 mg by mouth 2 (two) times daily.    Yes Historical Provider, MD  Cholecalciferol (VITAMIN D PO) Take 1 tablet by mouth daily.   Yes Historical Provider, MD  Chromium 200 MCG CAPS Take 1 capsule by mouth daily.    Yes Historical Provider, MD  cloNIDine (CATAPRES) 0.1 MG tablet Take 0.1  mg by mouth at bedtime.   Yes Historical Provider, MD  DULoxetine (CYMBALTA) 60 MG capsule TAKE (1) CAPSULE BY MOUTH TWICE DAILY. 11/17/15  Yes Mary-Margaret Hassell Done, FNP  Fish Oil-Cholecalciferol (FISH OIL + D3 PO) Take 1 capsule by mouth daily.    Yes Historical Provider, MD  Fluticasone-Salmeterol (ADVAIR) 250-50 MCG/DOSE AEPB Inhale 1 puff into the lungs 2 (two) times daily as needed.    Yes Historical Provider, MD  hydrOXYzine (ATARAX/VISTARIL) 10 MG tablet Take 2.5 tablets (25 mg total) by mouth 3 (three) times daily as needed for anxiety. Patient taking differently: Take 25 mg by mouth every 6 (six) hours as needed for anxiety.  12/21/15 01/20/16 Yes Norman Clay, MD  levalbuterol [facility] HFA) 45 MCG/ACT inhaler Inhale 2 puffs into the lungs every  4 (four) hours as needed for wheezing.   Yes Historical Provider, MD  Multiple Vitamins-Minerals (THEREMS-M) TABS Take 1 tablet by mouth daily.   Yes Historical Provider, MD  NUVIGIL 250 MG tablet TAKE 1 TABLET BY MOUTH TWICE DAILY. Patient taking differently: TAKE 1 TABLET DAILY 10/02/15  Yes Wardell Honour, MD  VITAMIN A PO Take 1 tablet by mouth daily.   Yes Historical Provider, MD  vitamin C (ASCORBIC ACID) 500 MG tablet Take 1,000 mg by mouth 2 (two) times daily.    Yes Historical Provider, MD  VITAMIN E PO Take 1 tablet by mouth daily.   Yes Historical Provider, MD  APPLE CIDER VINEGAR PO Take 1 tablet by mouth daily.    Historical Provider, MD  atorvastatin (LIPITOR) 10 MG tablet Take 10 mg by mouth daily.    Historical Provider, MD  clonazePAM (KLONOPIN) 0.5 MG tablet Take 1 tablet (0.5 mg total) by mouth 2 (two) times daily as needed for anxiety. Patient not taking: Reported on 12/17/2015 04/09/15   [name], MD  fentaNYL (DURAGESIC - DOSED MCG/HR) 100 MCG/HR Place 175 mcg onto the skin every 3 (three) days. Uses with 75 mcg patch to total 175 mcg.    Historical Provider, MD  nitroGLYCERIN (NITROSTAT) 0.3 MG SL tablet Place 0.4 mg under  the tongue every 5 (five) minutes as needed for chest pain.    Historical Provider, MD    Physical Exam:  Constitutional: NAD, calm, comfortable Vitals:   01/11/16 1400 01/11/16 1430 01/11/16 1500 01/11/16 1600  BP: 136/82 138/87 138/80 131/85  Pulse: 79 75 76 76  Resp: 11 13 14 19   Temp:      TempSrc:      SpO2: 100% 100% 99% 98%  Weight:      Height:       Eyes: PERRL, lids and conjunctivae normal ENMT: Mucous membranes are moist. Posterior pharynx clear of any exudate or lesions.  Neck: normal, supple, no masses, no thyromegaly Respiratory: clear to auscultation bilaterally, no wheezing, no crackles. Normal respiratory effort. No accessory muscle use.  Cardiovascular: Regular rate and rhythm, no murmurs / rubs / gallops. No extremity edema. 2+ pedal pulses. No carotid bruits.  Abdomen: Bowel sounds positive. Soft, no tenderness, no masses palpated. No hepatosplenomegaly.  Musculoskeletal: no clubbing / cyanosis. Right sided extremities deformities from necrotizing fasciitis and previous skin graft.. Good ROM, no contractures. Normal muscle tone.  Skin: Chronic right shoulder healing ulcers. No discharge or surrounding erythema. Neurologic: CN 2-12 grossly intact. Sensation intact, DTR normal. Strength 5/5 in all 4.  Psychiatric: Normal judgment and insight. Alert and oriented x 3. Normal mood.    Labs on Admission: I have personally reviewed following labs and imaging studies  CBC:  Recent Labs Lab 01/11/16 1230  WBC 12.1*  HGB 13.0  HCT 40.5  MCV 95.3  PLT [id number]   Basic Metabolic Panel:  Recent Labs Lab 01/11/16 1230  NA 135  K 4.3  CL 102  CO2 26  GLUCOSE 118*  BUN 23*  CREATININE 1.26*  CALCIUM 9.4   GFR: Estimated Creatinine Clearance: 68 mL/min (by C-G formula based on SCr of 1.26 mg/dL (H)). Liver Function Tests: No results for input(s): AST, ALT, ALKPHOS, BILITOT, PROT, ALBUMIN in the last 168 hours. No results for input(s): LIPASE, AMYLASE in the  last 168 hours. No results for input(s): AMMONIA in the last 168 hours. Coagulation Profile: No results for input(s): INR, PROTIME in the last 168 hours. Cardiac Enzymes:  No results for input(s): CKTOTAL, CKMB, CKMBINDEX, TROPONINI in the last 168 hours. BNP (last 3 results) No results for input(s): PROBNP in the last 8760 hours. HbA1C: No results for input(s): HGBA1C in the last 72 hours. CBG: No results for input(s): GLUCAP in the last 168 hours. Lipid Profile: No results for input(s): CHOL, HDL, LDLCALC, TRIG, CHOLHDL, LDLDIRECT in the last 72 hours. Thyroid Function Tests: No results for input(s): TSH, T4TOTAL, FREET4, T3FREE, THYROIDAB in the last 72 hours. Anemia Panel: No results for input(s): VITAMINB12, FOLATE, FERRITIN, TIBC, IRON, RETICCTPCT in the last 72 hours. Urine analysis:    Component Value Date/Time   COLORURINE YELLOW 04/06/2015 1700   APPEARANCEUR CLEAR 04/06/2015 1700   LABSPEC 1.025 04/06/2015 1700   PHURINE 5.5 04/06/2015 1700   GLUCOSEU NEGATIVE 04/06/2015 1700   HGBUR TRACE (A) 04/06/2015 1700   BILIRUBINUR NEGATIVE 04/06/2015 1700   KETONESUR NEGATIVE 04/06/2015 1700   PROTEINUR NEGATIVE 04/06/2015 1700   UROBILINOGEN 2.0 (H) 11/15/2014 1745   NITRITE POSITIVE (A) 04/06/2015 1700   LEUKOCYTESUR MODERATE (A) 04/06/2015 1700    Radiological Exams on Admission: Dg Chest 2 View  Result Date: 01/11/2016 CLINICAL DATA:  Chest pain and increasing shortness of Breath EXAM: CHEST  2 VIEW COMPARISON:  04/06/2015 FINDINGS: Cardiac shadow is within normal limits. The lungs are well aerated bilaterally. No focal infiltrate or sizable effusion is seen. No acute bony abnormality is noted. IMPRESSION: No active cardiopulmonary disease. Electronically Signed   By: Inez Catalina M.D.   On: 01/11/2016 13:25    EKG: Independently reviewed. Vent. rate 81 BPM PR interval * ms QRS duration 106 ms QT/QTc 378/439 ms P-R-T axes 61 71 67 Sinus rhythm Normal ECG since  last tracing no significant change  Assessment/Plan Principal Problem:   Chest pain Telemetry is/observation. Trend troponin levels. Check EKG in the morning. Patient to follow-up with cardiology as an outpatient.  Active Problems:   CAD (coronary artery disease) Continue carvedilol 6.25 mg by mouth twice a day. Continue atorvastatin 10 mg by mouth daily. Continue aspirin 81 mg by mouth daily.    Chronic pain syndrome He is currently of fentanyl patches. Continue Suboxone and duloxetine.    COPD (chronic obstructive pulmonary disease) (HCC) Continue supplemental oxygen. Bronchodilators as needed.    Depression Continue duloxetine 60 mg by mouth twice a day.    Anxiety Continue Atarax 25 mg every 6 hours as needed.       Hyperlipidemia Continue atorvastatin 10 mg by mouth daily. Monitor LFTs periodically.     DVT prophylaxis: Lovenox SQ. Code Status: Full code. Family Communication:  Disposition Plan: Telemetry monitoring and troponin levels trending. Consults called:  Admission status: Telemetry is/observation.   Reubin Milan MD [facility] Pager [phone].  If 7PM-7AM, please contact night-coverage [email or web address] Password [email or web address]  01/11/2016, 4:31 PM

## 2016-01-11 NOTE — ED Notes (Signed)
Patient transported to X-ray 

## 2016-01-11 NOTE — Telephone Encounter (Signed)
Authorize 30 days only. Then contact the patient letting them know that they will need an appointment before any further prescriptions can be sent in. 

## 2016-01-12 DIAGNOSIS — F419 Anxiety disorder, unspecified: Secondary | ICD-10-CM | POA: Diagnosis not present

## 2016-01-12 DIAGNOSIS — R079 Chest pain, unspecified: Secondary | ICD-10-CM | POA: Diagnosis not present

## 2016-01-12 LAB — TROPONIN I: Troponin I: <0.03 ng/mL (ref <0.03)

## 2016-01-12 MED ORDER — INFLUENZA VAC SPLIT QUAD 0.5 ML IM SUSY: 0.5000 mL | PREFILLED_SYRINGE | INTRAMUSCULAR | Status: DC

## 2016-01-12 MED ORDER — MODAFINIL 200 MG PO TABS
100.0000 mg | ORAL_TABLET | Freq: Every day | ORAL | Status: DC
  Administered 2016-01-12: 100 mg via ORAL
  Filled 2016-01-12: qty 1

## 2016-01-12 MED ORDER — INFLUENZA VAC SPLIT QUAD 0.5 ML IM SUSY
0.5000 mL | PREFILLED_SYRINGE | INTRAMUSCULAR | Status: AC
  Administered 2016-01-12: 0.5 mL via INTRAMUSCULAR
  Filled 2016-01-12: qty 0.5

## 2016-01-12 NOTE — Discharge Summary (Signed)
Physician Discharge Summary  Dale Shields [id number] DOB: 1951-01-27 DOA: 01/11/2016  PCP: [name], MD  Admit date: 01/11/2016 Discharge date: 01/12/2016  Admitted From: Home  Disposition:  Home   Recommendations for Outpatient Follow-up:  1. Follow up with PCP in 1-2 weeks 2. Follow up with his Cardiology Dr Alden Benjamin next week.   Home Health: No  Equipment/Devices: None   Discharge Condition: Stable  CODE STATUS: FULL CODE  Diet recommendation: Heart healthy   Brief/Interim Summary: 83 yom with a hx of anxiety, depression, fibromyalgia, BPH, CAD, COPD, GERD, HLD, and HTN presents with complaints of chest pain. He was noted to have an elevated WBC count on admission. He was admitted for further observation of chest pain. He had negative troponins, and did not have any further chestpain.  He was felt to be stable for discharge and to follow up with his cardiologist Dr Alden Benjamin next week.    Discharge Diagnoses:  Principal Problem:   Chest pain Active Problems:   Chronic pain syndrome   COPD (chronic obstructive pulmonary disease) (HCC)   CAD (coronary artery disease)   Depression   Hyperlipidemia   Anxiety 1.    Discharge Instructions  Discharge Instructions    Diet - low sodium heart healthy    Complete by:  As directed    Discharge instructions    Complete by:  As directed    Follow up with your cardiologist Dr Mariel Sleet next week.   Increase activity slowly    Complete by:  As directed        Medication List    TAKE these medications   albuterol (2.5 MG/3ML) 0.083% nebulizer solution Commonly known as:  PROVENTIL Take 2.5 mg by nebulization every 6 (six) hours as needed for wheezing or shortness of breath.   APPLE CIDER VINEGAR PO Take 1 tablet by mouth daily.   ARIPiprazole 2 MG tablet Commonly known as:  ABILIFY Take 1 tablet (2 mg total) by mouth daily.   aspirin 81 MG chewable tablet Chew 81 mg by mouth daily.   atorvastatin 10 MG  tablet Commonly known as:  LIPITOR Take 10 mg by mouth daily.   b complex vitamins tablet Take 1 tablet by mouth daily.   carvedilol 6.25 MG tablet Commonly known as:  COREG Take 6.25 mg by mouth 2 (two) times daily.   Chromium 200 MCG Caps Take 1 capsule by mouth daily.   clonazePAM 0.5 MG tablet Commonly known as:  KLONOPIN Take 1 tablet (0.5 mg total) by mouth 2 (two) times daily as needed for anxiety.   cloNIDine 0.1 MG tablet Commonly known as:  CATAPRES Take 0.1 mg by mouth at bedtime.   DULoxetine 60 MG capsule Commonly known as:  CYMBALTA TAKE (1) CAPSULE BY MOUTH TWICE DAILY.   fentaNYL 100 MCG/HR Commonly known as:  DURAGESIC - dosed mcg/hr Place 175 mcg onto the skin every 3 (three) days. Uses with 75 mcg patch to total 175 mcg.   FISH OIL + D3 PO Take 1 capsule by mouth daily.   Fluticasone-Salmeterol 250-50 MCG/DOSE Aepb Commonly known as:  ADVAIR Inhale 1 puff into the lungs 2 (two) times daily as needed.   hydrOXYzine 10 MG tablet Commonly known as:  ATARAX/VISTARIL Take 2.5 tablets (25 mg total) by mouth 3 (three) times daily as needed for anxiety. What changed:  when to take this   levalbuterol 45 MCG/ACT inhaler Commonly known as:  XOPENEX HFA Inhale 2 puffs into the lungs every 4 (four)  hours as needed for wheezing.   nitroGLYCERIN 0.3 MG SL tablet Commonly known as:  NITROSTAT Place 0.4 mg under the tongue every 5 (five) minutes as needed for chest pain.   NUVIGIL 250 MG tablet Generic drug:  Armodafinil TAKE 1 TABLET BY MOUTH TWICE DAILY. What changed:  See the new instructions.   SUBOXONE 12-3 MG Film Generic drug:  Buprenorphine HCl-Naloxone HCl Place 1 Film under the tongue daily.   THEREMS-M Tabs Take 1 tablet by mouth daily.   VITAMIN A PO Take 1 tablet by mouth daily.   vitamin C 500 MG tablet Commonly known as:  ASCORBIC ACID Take 1,000 mg by mouth 2 (two) times daily.   VITAMIN D PO Take 1 tablet by mouth daily.    VITAMIN E PO Take 1 tablet by mouth daily.       Allergies  Allergen Reactions  . Ultram [Tramadol Hcl] Other (See Comments)    Seizures; "grand mal; twice"  . Gabapentin Other (See Comments)    Made pt dilusional    Consultations:  None.    Procedures/Studies: Dg Chest 2 View  Result Date: 01/11/2016 CLINICAL DATA:  Chest pain and increasing shortness of Breath EXAM: CHEST  2 VIEW COMPARISON:  04/06/2015 FINDINGS: Cardiac shadow is within normal limits. The lungs are well aerated bilaterally. No focal infiltrate or sizable effusion is seen. No acute bony abnormality is noted. IMPRESSION: No active cardiopulmonary disease. Electronically Signed   By: Inez Catalina M.D.   On: 01/11/2016 13:25    Subjective: No further CP.   Discharge Exam: Vitals:   01/11/16 2107 01/12/16 0500  BP: 110/60 (!) 117/57  Pulse: 78 73  Resp: 16 16  Temp: 98.1 F (36.7 C) 97.9 F (36.6 C)   Vitals:   01/11/16 2107 01/11/16 2330 01/12/16 0500 01/12/16 0736  BP: 110/60  (!) 117/57   Pulse: 78  73   Resp: 16  16   Temp: 98.1 F (36.7 C)  97.9 F (36.6 C)   TempSrc: Oral  Oral   SpO2: 96% 95% 98% 94%  Weight:      Height:        General: Pt is alert, awake, not in acute distress Cardiovascular: RRR, S1/S2 +, no rubs, no gallops Respiratory: CTA bilaterally, no wheezing, no rhonchi Abdominal: Soft, NT, ND, bowel sounds + Extremities: no edema, no cyanosis    The results of significant diagnostics from this hospitalization (including imaging, microbiology, ancillary and laboratory) are listed below for reference.     Basic Metabolic Panel:  Recent Labs Lab 01/11/16 1230  NA 135  K 4.3  CL 102  CO2 26  GLUCOSE 118*  BUN 23*  CREATININE 1.26*  CALCIUM 9.4   CBC:  Recent Labs Lab 01/11/16 1230  WBC 12.1*  HGB 13.0  HCT 40.5  MCV 95.3  PLT 363   Cardiac Enzymes:  Recent Labs Lab 01/11/16 2052 01/12/16 0210  TROPONINI <0.03 <0.03   Urinalysis    Component  Value Date/Time   COLORURINE YELLOW 04/06/2015 1700   APPEARANCEUR CLEAR 04/06/2015 1700   LABSPEC 1.025 04/06/2015 1700   PHURINE 5.5 04/06/2015 1700   GLUCOSEU NEGATIVE 04/06/2015 1700   HGBUR TRACE (A) 04/06/2015 1700   BILIRUBINUR NEGATIVE 04/06/2015 1700   KETONESUR NEGATIVE 04/06/2015 1700   PROTEINUR NEGATIVE 04/06/2015 1700   UROBILINOGEN 2.0 (H) 11/15/2014 1745   NITRITE POSITIVE (A) 04/06/2015 1700   LEUKOCYTESUR MODERATE (A) 04/06/2015 1700   Time coordinating discharge: Over 30  minutes   Allergies  Allergen Reactions  . Ultram [Tramadol Hcl] Other (See Comments)    Seizures; "grand mal; twice"  . Gabapentin Other (See Comments)    Made pt dilusional    Consultations:  None    Procedures/Studies: Dg Chest 2 View  Result Date: 01/11/2016 CLINICAL DATA:  Chest pain and increasing shortness of Breath EXAM: CHEST  2 VIEW COMPARISON:  04/06/2015 FINDINGS: Cardiac shadow is within normal limits. The lungs are well aerated bilaterally. No focal infiltrate or sizable effusion is seen. No acute bony abnormality is noted. IMPRESSION: No active cardiopulmonary disease. Electronically Signed   By: Inez Catalina M.D.   On: 01/11/2016 13:25    SIGNED:   Kenyon Ana  [facility] 01/12/2016, 10:36 AM Pager   If 7PM-7AM, please contact night-coverage [email or web address] Password [email or web address]  By signing my name below, I, Collene Leyden, attest that this documentation has been prepared under the direction and in the presence of Orvan Falconer, MD. Electronically signed: Collene Leyden, Scribe. 01/11/1709:36 AM

## 2016-01-12 NOTE — Progress Notes (Signed)
Patient discharged home.  IV removed - WNL.  Verbalizes understanding of DC instructions and meds.  Assisted off unit via WC in NAD.

## 2016-01-12 NOTE — Care Management Note (Addendum)
Case Management Note  Patient Details  Name: Dale Shields MRN: [id number] Date of Birth: April 24, 1950  Subjective/Objective:                  Pt admitted with CP. He is from home, lives alone and has CAP aid. He has PCP and insurance with drug coverage. He has no DME PTA. He plans to return home with his CAP services at DC. Anticipate DC today.   Action/Plan: No CM needs.   Expected Discharge Date:     01/12/2016             Expected Discharge Plan:  Home/Self Care  In-House Referral:  NA  Discharge planning Services  CM Consult  Post Acute Care Choice:  NA Choice offered to:  NA  DME Arranged:    DME Agency:     HH Arranged:    HH Agency:     Status of Service:  Completed, signed off  If discussed at [employer] of Stay Meetings, dates discussed:    Additional Comments:  Sherald Barge, RN 01/12/2016, 1:17 PM

## 2016-01-12 NOTE — Care Management Obs Status (Signed)
[place] NOTIFICATION   Patient Details  Name: LEVIE JEANPIERRE MRN: [id number] Date of Birth: 18-Oct-1950   Medicare Observation Status Notification Given:  Yes    Sherald Barge, RN 01/12/2016, 1:04 PM

## 2016-01-12 NOTE — Progress Notes (Signed)
PROGRESS NOTE    Dale Shields  [id number] DOB: 20-Dec-1950 DOA: 01/11/2016 PCP: [name], MD    Brief Narrative: 35 yom with a hx of anxiety, depression, fibromyalgia, BPH, CAD, COPD, GERD, HLD, and HTN presents with complaints of chest pain. He was noted to have an elevated WBC count on admission. He was admitted for further observation of chest pain.   Assessment & Plan:   Principal Problem:   Chest pain Active Problems:   Chronic pain syndrome   COPD (chronic obstructive pulmonary disease) (HCC)   CAD (coronary artery disease)   Depression   Hyperlipidemia   Anxiety  1. Chest pain. Seems to be atypical. Initial troponin was negative. EKG shows sinus rhythm. CXR negative. Cycle cardiac markers.  2. Hx of CAD. Continue outpatient medications including, aspirin and carvedilol.  3. Chronic pain syndrome. Continue pain management.  4. COPD. Appears compensated at this time. Continue supplemental oxygen and PRN bronchodilators.  5. HLD. Continue statin.  6. HTN. Stable. Continue outpatient medications.  7. GERD. Continue PPI.  8. Anxiety and depression. Continue outpatient medications.     DVT prophylaxis: Lovenox  Code Status: FULL CODE  Family Communication: No family bedside Disposition Plan: Discharge home once improved.    Consultants:   None   Procedures:   None   Antimicrobials:   None    Subjective: No further CP.   Objective: Vitals:   01/11/16 1739 01/11/16 2107 01/11/16 2330 01/12/16 0500  BP: 118/77 110/60  (!) 117/57  Pulse: 73 78  73  Resp: 20 16  16   Temp: 98.1 F (36.7 C) 98.1 F (36.7 C)  97.9 F (36.6 C)  TempSrc: Oral Oral  Oral  SpO2: 95% 96% 95% 98%  Weight:      Height:        Intake/Output Summary (Last 24 hours) at 01/12/16 0649 Last data filed at 01/12/16 0100  Gross per 24 hour  Intake                0 ml  Output              750 ml  Net             -750 ml   Filed Weights   01/11/16 1204 01/11/16 1218    Weight: 90.7 kg (200 lb) 90.7 kg (200 lb)    Examination:  General exam: Appears calm and comfortable  Respiratory system: Clear to auscultation. Respiratory effort normal. Cardiovascular system: S1 & S2 heard, RRR. No JVD, murmurs, rubs, gallops or clicks. No pedal edema. Gastrointestinal system: Abdomen is nondistended, soft and nontender. No organomegaly or masses felt. Normal bowel sounds heard. Central nervous system: Alert and oriented. No focal neurological deficits. Extremities: Symmetric 5 x 5 power. Skin: No rashes, lesions or ulcers Psychiatry: Judgement and insight appear normal. Mood & affect appropriate.     Data Reviewed: I have personally reviewed following labs and imaging studies  CBC:  Recent Labs Lab 01/11/16 1230  WBC 12.1*  HGB 13.0  HCT 40.5  MCV 95.3  PLT [id number]   Basic Metabolic Panel:  Recent Labs Lab 01/11/16 1230  NA 135  K 4.3  CL 102  CO2 26  GLUCOSE 118*  BUN 23*  CREATININE 1.26*  CALCIUM 9.4    Radiology Studies: Dg Chest 2 View  Result Date: 01/11/2016 CLINICAL DATA:  Chest pain and increasing shortness of Breath EXAM: CHEST  2 VIEW COMPARISON:  04/06/2015 FINDINGS: Cardiac shadow is within  normal limits. The lungs are well aerated bilaterally. No focal infiltrate or sizable effusion is seen. No acute bony abnormality is noted. IMPRESSION: No active cardiopulmonary disease. Electronically Signed   By: Inez Catalina M.D.   On: 01/11/2016 13:25   Scheduled Meds: . ARIPiprazole  2 mg Oral Daily  . Armodafinil  1 tablet Oral Daily  . aspirin  81 mg Oral Daily  . atorvastatin  10 mg Oral q1800  . buprenorphine  12 mg Sublingual Daily  . carvedilol  6.25 mg Oral BID WC  . cloNIDine  0.1 mg Oral QHS  . DULoxetine  60 mg Oral BID  . enoxaparin (LOVENOX) injection  40 mg Subcutaneous Q24H  . mometasone-formoterol  2 puff Inhalation BID  . sodium chloride flush  3 mL Intravenous Q12H   Continuous Infusions:    LOS: 0 days     Time spent: 25 minutes    Orvan Falconer, MD FACP [facility] If 7PM-7AM, please contact night-coverage [email or web address] Password [email or web address] 01/12/2016, 6:49 AM   By signing my name below, I, Collene Leyden, attest that this documentation has been prepared under the direction and in the presence of Orvan Falconer, MD. Electronically signed: Collene Leyden, Scribe. 01/12/16

## 2016-01-14 ENCOUNTER — Telehealth: Payer: Self-pay | Admitting: *Deleted

## 2016-01-14 NOTE — Telephone Encounter (Signed)
Spoke with patient about scheduling a hospital follow. He stated that he is now a patient of Dr Trena Platt at [facility]. I changed his PCP in [facility] to Dr Manuella Ghazi.

## 2016-01-14 NOTE — Progress Notes (Signed)
BH MD/PA/NP OP Progress Note  01/15/2016 10:28 AM Dale Shields  MRN:  [id number]  Chief Complaint:  Chief Complaint    Follow-up; Depression     Subjective:  "I'm doing good." HPI:  - since the lat visit, patient was admitted for chest pain; medical evaluation negative and he will have follow up appointment with his cardiologist.   Patient states that he feels fine since the last visit. He talks about an episode he had visual hallucinations of seeing people when he was on gabapentin. He continues to have pain it on his body and would like his Suboxone dose to be increased by his provider given his ongoing pain. He is planning to buy some equipment for exercise rather than going to physical therapy. He continues to have passive SI, but states he wants to "improve" and suicide is the last option. He believes that Abilify has helping for his mood and would like to continue it. He is unsure of benefit from hydroxyzine but would like to continue as needed.      Visit Diagnosis:    ICD-9-CM ICD-10-CM   1. Major depressive disorder, recurrent episode, moderate (HCC) 296.32 F33.1     Past Psychiatric History: Seen psychiatrist for year in 1990's for depression when he started to have weakness Psychiatry admission: [place] in 2011, "manic" was on pain medication, steroids Used to see a therapist at nursing home 2 years ago Denies any suicide attempt  Previous Psychotropic Medications: Yes  He has been on duloxetine 60 mg for years (he has not tried higher dose before). He has tried Zoloft in 1993, and Bupropion for smoking cessation. He has been on Nuvigil for narcolepsy. He used to be on Xanax and clonazepam but it was discontinued. He had tried PT in the past with limited improvement.   Past Medical History:  Past Medical History:  Diagnosis Date  . Anemia of chronic disease   . Anxiety   . Anxiety and depression   . BPH (benign prostatic hypertrophy)   . CAD (coronary artery  disease) 11/2011   Stents x 3  . Carpal tunnel syndrome   . COPD (chronic obstructive pulmonary disease) ([place])   . DDD (degenerative disc disease)    knees, hips, shoulders--Dr. Ronnie Derby [facility] prior to this)  . Depression   . Endocarditis 2012  . Exertional dyspnea   . Fibromyalgia   . GERD (gastroesophageal reflux disease)   . H/O hiatal hernia   . Heart murmur   . History of blood transfusion    "gave me too much coumadin and heparin once"  . History of epistaxis   . History of gout   . History of MRSA infection 2012   UTI, bacteremia, and endocarditis  . History of renal failure 2011   "on dialysis 3 times over 2 years"  . HLD (hyperlipidemia)   . Hydronephrosis   . Hypertension   . Hypothyroidism   . Insomnia   . Multiple sclerosis ([place])    with optic neuritis  . Myocardial infarction    "according to stress test; that's news to me"  . Narcolepsy   . Necrotizing fasciitis ([place])   . Neurogenic bladder    chronic indwelling foley catheter-changed monthly  . Optic neuritis   . OSA (obstructive sleep apnea)    "tried CPAP; lost more sleep w/use"  . Osteoarthritis of multiple joints    "everywhere"  . Pulmonary embolism ([place])    "suspected; never confirmed"--? treated with coumadin  .  Scoliosis   . Steroid-induced psychosis   . Supraspinatus tendon tear 2012  . Thrombocytopenia ([place])     Past Surgical History:  Procedure Laterality Date  . CARPAL TUNNEL RELEASE  ~ 2010   left with radius fracture repair  . COLONOSCOPY  2000; 2005; 03/15/2011   diminutve adenoma--recall 2017  . CORONARY ANGIOPLASTY  11/22/2011   3 vessels  . CORONARY ANGIOPLASTY WITH STENT PLACEMENT  11/22/2011   DES to LAD  . KNEE ARTHROSCOPY W/ MENISCAL REPAIR  1990's   right  . LEFT HEART CATHETERIZATION WITH CORONARY ANGIOGRAM N/A 11/22/2011   Procedure: LEFT HEART CATHETERIZATION WITH CORONARY ANGIOGRAM;  Surgeon: Laverda Page, MD;  Location: [facility] CATH LAB;  Service: Cardiovascular;   Laterality: N/A;  . nasal cautery    . PERCUTANEOUS CORONARY STENT INTERVENTION (PCI-S)  11/22/2011   Procedure: PERCUTANEOUS CORONARY STENT INTERVENTION (PCI-S);  Surgeon: Laverda Page, MD;  Location: [facility] CATH LAB;  Service: Cardiovascular;;  . SKIN GRAFT  04/2009   right thigh to right "wrist all the way to my shoulder; necrotizing fasciitis    Family Psychiatric History: denies MH diagnosis, denies suicide attempt  Family History:  Family History  Problem Relation Age of Onset  . Alzheimer's disease Mother   . Colon cancer Neg Hx     Social History:  Social History   Social History  . Marital status: Single    Spouse name: N/A  . Number of children: N/A  . Years of education: N/A   Social History Main Topics  . Smoking status: Former Smoker    Packs/day: 0.00    Years: 40.00    Quit date: 08/15/2014  . Smokeless tobacco: Never Used     Comment: Counseling sheet to quit smoking given in exam room   . Alcohol use No     Comment: 11/22/2011 "like crazy til 1988; nothing since"  . Drug use: No     Comment: *8//13/13 "1986 til 1988 like crazy; nothing since"  . Sexual activity: Not Currently   Other Topics Concern  . None   Social History Narrative   Never married, no children.   Orig from [place], [place], lived a long time in [place].   Has been in [place] since about 2004.   College at [employer] "for years".   Denies alcohol or drug use.   Cigarettes: 50 pack-yr hx--current as of 03/2013.    Allergies:  Allergies  Allergen Reactions  . Ultram [Tramadol Hcl] Other (See Comments)    Seizures; "grand mal; twice"  . Gabapentin Other (See Comments)    Made pt dilusional    Metabolic Disorder Labs: Lab Results  Component Value Date   HGBA1C  08/29/2009    5.6 (NOTE)                                                                       According to the ADA Clinical Practice Recommendations for 2011, when HbA1c is used as a screening test:   >=6.5%   Diagnostic  of Diabetes Mellitus           (if abnormal result  is confirmed)  5.7-6.4%   Increased risk of developing Diabetes Mellitus  References:Diagnosis and Classification of Diabetes Mellitus,Diabetes [id number]  1):S62-S69 and Standards of Medical Care in         Diabetes - 2011,Diabetes [id number]  (Suppl 1):S11-S61.   MPG  08/29/2009    114 (NOTE)Please note change in reference range(s).    No results found for: PROLACTIN Lab Results  Component Value Date   CHOL 111 09/29/2010   TRIG 130 09/29/2010   HDL 17 (L) 09/29/2010   CHOLHDL 6.5 09/29/2010   VLDL 26 09/29/2010   LDLCALC 68 09/29/2010   LDLCALC  08/21/2009    57        Total Cholesterol/HDL:CHD Risk Coronary Heart Disease Risk Table                     Men   Women  1/2 Average Risk   3.4   3.3  Average Risk       5.0   4.4  2 X Average Risk   9.6   7.1  3 X Average Risk  23.4   11.0        Use the calculated Patient Ratio above and the CHD Risk Table to determine the patient's CHD Risk.        ATP III CLASSIFICATION (LDL):  <100     mg/dL   Optimal  100-129  mg/dL   Near or Above                    Optimal  130-159  mg/dL   Borderline  160-189  mg/dL   High  >190     mg/dL   Very High     Current Medications: Current Outpatient Prescriptions  Medication Sig Dispense Refill  . DULoxetine (CYMBALTA) 60 MG capsule TAKE (1) CAPSULE BY MOUTH TWICE DAILY. 60 capsule 0  . albuterol (PROVENTIL) (2.5 MG/3ML) 0.083% nebulizer solution Take 2.5 mg by nebulization every 6 (six) hours as needed for wheezing or shortness of breath.    . APPLE CIDER VINEGAR PO Take 1 tablet by mouth daily.    . ARIPiprazole (ABILIFY) 2 MG tablet Take 1 tablet (2 mg total) by mouth daily. 30 tablet 0  . aspirin 81 MG chewable tablet Chew 81 mg by mouth daily.     Marland Kitchen atorvastatin (LIPITOR) 10 MG tablet Take 10 mg by mouth daily.    Marland Kitchen b complex vitamins tablet Take 1 tablet by mouth daily.    . Buprenorphine HCl-Naloxone HCl (SUBOXONE) 12-3  MG FILM Place 1 Film under the tongue daily.    . carvedilol (COREG) 6.25 MG tablet Take 6.25 mg by mouth 2 (two) times daily.     . Cholecalciferol (VITAMIN D PO) Take 1 tablet by mouth daily.    . Chromium 200 MCG CAPS Take 1 capsule by mouth daily.     . cloNIDine (CATAPRES) 0.1 MG tablet Take 0.1 mg by mouth at bedtime.    . fentaNYL (DURAGESIC - DOSED MCG/HR) 100 MCG/HR Place 175 mcg onto the skin every 3 (three) days. Uses with 75 mcg patch to total 175 mcg.    . Fish Oil-Cholecalciferol (FISH OIL + D3 PO) Take 1 capsule by mouth daily.     . Fluticasone-Salmeterol (ADVAIR) 250-50 MCG/DOSE AEPB Inhale 1 puff into the lungs 2 (two) times daily as needed.     . hydrOXYzine (ATARAX/VISTARIL) 25 MG tablet Take 1 tablet (25 mg total) by mouth 3 (three) times daily as needed for anxiety. 90 tablet 0  . levalbuterol (XOPENEX HFA) 45 MCG/ACT inhaler Inhale 2 puffs  into the lungs every 4 (four) hours as needed for wheezing.    . Multiple Vitamins-Minerals (THEREMS-M) TABS Take 1 tablet by mouth daily.    . nitroGLYCERIN (NITROSTAT) 0.3 MG SL tablet Place 0.4 mg under the tongue every 5 (five) minutes as needed for chest pain.    Marland Kitchen NUVIGIL 250 MG tablet TAKE 1 TABLET BY MOUTH TWICE DAILY. (Patient taking differently: TAKE 1 TABLET DAILY) 60 tablet 0  . VITAMIN A PO Take 1 tablet by mouth daily.    . vitamin C (ASCORBIC ACID) 500 MG tablet Take 1,000 mg by mouth 2 (two) times daily.     Marland Kitchen VITAMIN E PO Take 1 tablet by mouth daily.     No current facility-administered medications for this visit.     Neurologic: Headache: No Seizure: No Paresthesias: No  Musculoskeletal: Strength & Muscle Tone: decreased Gait & Station: on a wheelchair Patient leans: N/A  Psychiatric Specialty Exam: Review of Systems  Cardiovascular: Negative for chest pain and palpitations.  Musculoskeletal: Positive for back pain and myalgias.  Psychiatric/Behavioral: Negative for depression, hallucinations and suicidal  ideas. The patient is nervous/anxious. The patient does not have insomnia.   All other systems reviewed and are negative.   Blood pressure (!) 149/91, pulse 83, height 5\' 10"  (1.778 m).There is no height or weight on file to calculate BMI.  General Appearance: Casual  Eye Contact:  Good  Speech:  Clear and Coherent  Volume:  Normal  Mood:  "good"  Affect:  Appropriate, Congruent and euthymic, smiles at times  Thought Process:  Coherent and Goal Directed  Orientation:  Full (Time, Place, and Person)  Thought Content: Logical   Suicidal Thoughts:  Yes.  without intent/plan  Homicidal Thoughts:  No  Memory:  Immediate;   Fair Recent;   Fair Remote;   Fair  Judgement:  Good  Insight:  Fair  Psychomotor Activity:  Normal  Concentration:  Concentration: Fair and Attention Span: Fair  Recall:  [employer] of Knowledge: Fair  Language: Good  Akathisia:  No  Handed:  Right  AIMS (if indicated):  No tremors  Assets:  Communication Skills Desire for Improvement  ADL's:  Intact  Cognition: WNL  Sleep:  good   Assessment Mr.Wagoneris a 65 y.o.malewith depression, anxiety, chronic pain syndrome, multiple sclerosis, CAD with PTCA  in 11/2011, Stent to LAD, hypertension, hypercholesterolemia who presented to the clinic for depression.   # MDD There has been a slight improvement in his neurovegetative symptoms, which coincided with starting Abilify. Will continue current regimen. He was advised to contact with a therapist he used to see to re-establish care for psychotherapy. It is noted that routine follow up visit with his PCP is strongly recommended for this patient to ameliorate his chronic physical symptoms. He declines the option to see PT but agreed for behavioral activation.   Plans - Continue duloxetine 60 mg daily - Continue Abilify 2 mg daily - hydroxyzine 25 mg TID PRN for anxiety - Return to clinic in one month - Patient to contact a therapist for psychotherapy;  recommended CBT  The patient demonstrates the following  risk factors for suicide: Chronic risk factors for suicide include psychiatric disorder of depression, medical illness of chronic fatigue syndrome/chronic pain, demographic factors (male, >60yo), completed suicide in a family member,  Acute risk factors for suicide include N/A.  Protective factors for this patient include positive social support, positive therapeutic relationship, coping skills, hope for the future.  Considering these factors,  the overall suicide risk at this point appears to be low.   Treatment Plan Summary:Medication management   Norman Clay, MD 01/15/2016, 10:28 AM

## 2016-01-15 ENCOUNTER — Encounter (HOSPITAL_COMMUNITY): Payer: Self-pay | Admitting: Psychiatry

## 2016-01-15 ENCOUNTER — Ambulatory Visit (INDEPENDENT_AMBULATORY_CARE_PROVIDER_SITE_OTHER): Payer: Medicare Other | Admitting: Psychiatry

## 2016-01-15 VITALS — BP 149/91 | HR 83 | Ht 70.0 in

## 2016-01-15 DIAGNOSIS — Z87891 Personal history of nicotine dependence: Secondary | ICD-10-CM

## 2016-01-15 DIAGNOSIS — F331 Major depressive disorder, recurrent, moderate: Secondary | ICD-10-CM

## 2016-01-15 DIAGNOSIS — R45851 Suicidal ideations: Secondary | ICD-10-CM | POA: Diagnosis not present

## 2016-01-15 MED ORDER — ARIPIPRAZOLE 2 MG PO TABS: 2.0000 mg | ORAL_TABLET | Freq: Every day | ORAL | 0 refills | Status: DC

## 2016-01-15 MED ORDER — HYDROXYZINE HCL 25 MG PO TABS: 25.0000 mg | ORAL_TABLET | Freq: Three times a day (TID) | ORAL | 0 refills | Status: AC | PRN

## 2016-01-15 NOTE — Patient Instructions (Signed)
-   Continue duloxetine 60 mg daily - Continue Abilify 2 mg daily - Continue hydroxyzine 25 mg three times a day as needed for anxiety - Contact with your therapist to restart psychotherapy (I believe cognitive behavioral therapy would be great for you) - Return to clinic in one month

## 2016-01-26 ENCOUNTER — Emergency Department (HOSPITAL_COMMUNITY)
Admission: EM | Admit: 2016-01-26 | Discharge: 2016-01-26 | Disposition: A | Discharge status: Home / Self Care | Payer: Medicare Other | Attending: Emergency Medicine | Admitting: Emergency Medicine

## 2016-01-26 ENCOUNTER — Emergency Department (HOSPITAL_COMMUNITY): Payer: Medicare Other

## 2016-01-26 ENCOUNTER — Encounter (HOSPITAL_COMMUNITY): Payer: Self-pay

## 2016-01-26 DIAGNOSIS — K219 Gastro-esophageal reflux disease without esophagitis: Secondary | ICD-10-CM | POA: Diagnosis not present

## 2016-01-26 DIAGNOSIS — I2510 Atherosclerotic heart disease of native coronary artery without angina pectoris: Secondary | ICD-10-CM | POA: Diagnosis not present

## 2016-01-26 DIAGNOSIS — Z7982 Long term (current) use of aspirin: Secondary | ICD-10-CM | POA: Diagnosis not present

## 2016-01-26 DIAGNOSIS — E039 Hypothyroidism, unspecified: Secondary | ICD-10-CM | POA: Insufficient documentation

## 2016-01-26 DIAGNOSIS — R1110 Vomiting, unspecified: Secondary | ICD-10-CM

## 2016-01-26 DIAGNOSIS — Z79899 Other long term (current) drug therapy: Secondary | ICD-10-CM | POA: Diagnosis not present

## 2016-01-26 DIAGNOSIS — Z87891 Personal history of nicotine dependence: Secondary | ICD-10-CM | POA: Insufficient documentation

## 2016-01-26 DIAGNOSIS — J449 Chronic obstructive pulmonary disease, unspecified: Secondary | ICD-10-CM | POA: Diagnosis not present

## 2016-01-26 DIAGNOSIS — I10 Essential (primary) hypertension: Secondary | ICD-10-CM | POA: Diagnosis not present

## 2016-01-26 DIAGNOSIS — R109 Unspecified abdominal pain: Secondary | ICD-10-CM

## 2016-01-26 DIAGNOSIS — R1084 Generalized abdominal pain: Secondary | ICD-10-CM | POA: Diagnosis present

## 2016-01-26 LAB — CBC WITH DIFFERENTIAL/PLATELET
Basophils Absolute: 0 10*3/uL (ref 0.0–0.1)
Basophils Relative: 0 %
Eosinophils Absolute: 0.1 10*3/uL (ref 0.0–0.7)
Eosinophils Relative: 1 %
HEMATOCRIT: 39.8 % (ref 39.0–52.0)
HEMOGLOBIN: 13 g/dL (ref 13.0–17.0)
LYMPHS ABS: 1.9 10*3/uL (ref 0.7–4.0)
LYMPHS PCT: 10 %
MCH: 30.8 pg (ref 26.0–34.0)
MCHC: 32.7 g/dL (ref 30.0–36.0)
MCV: 94.3 fL (ref 78.0–100.0)
MONOS PCT: 8 %
Monocytes Absolute: 1.5 10*3/uL — ABNORMAL HIGH (ref 0.1–1.0)
NEUTROS PCT: 81 %
Neutro Abs: 16.1 10*3/uL — ABNORMAL HIGH (ref 1.7–7.7)
Platelets: 252 10*3/uL (ref 150–400)
RBC: 4.22 MIL/uL (ref 4.22–5.81)
RDW: 14.1 % (ref 11.5–15.5)
WBC: 19.6 10*3/uL — AB (ref 4.0–10.5)

## 2016-01-26 LAB — COMPREHENSIVE METABOLIC PANEL WITH GFR
ALT: 17 U/L (ref 17–63)
AST: 20 U/L (ref 15–41)
Albumin: 3.7 g/dL (ref 3.5–5.0)
Alkaline Phosphatase: 96 U/L (ref 38–126)
Anion gap: 10 (ref 5–15)
BUN: 30 mg/dL — ABNORMAL HIGH (ref 6–20)
CO2: 32 mmol/L (ref 22–32)
Calcium: 10.2 mg/dL (ref 8.9–10.3)
Chloride: 96 mmol/L — ABNORMAL LOW (ref 101–111)
Creatinine, Ser: 1.38 mg/dL — ABNORMAL HIGH (ref 0.61–1.24)
GFR calc Af Amer: >60 mL/min
GFR calc non Af Amer: 53 mL/min — ABNORMAL LOW
Glucose, Bld: 146 mg/dL — ABNORMAL HIGH (ref 65–99)
Potassium: 4.2 mmol/L (ref 3.5–5.1)
Sodium: 138 mmol/L (ref 135–145)
Total Bilirubin: 0.5 mg/dL (ref 0.3–1.2)
Total Protein: 8 g/dL (ref 6.5–8.1)

## 2016-01-26 LAB — LIPASE, BLOOD: Lipase: 14 U/L (ref 11–51)

## 2016-01-26 MED ORDER — ONDANSETRON 8 MG PO TBDP: 8.0000 mg | ORAL_TABLET | Freq: Three times a day (TID) | ORAL | 0 refills | Status: DC | PRN

## 2016-01-26 MED ORDER — ALUM & MAG HYDROXIDE-SIMETH 200-200-20 MG/5ML PO SUSP
30.0000 mL | Freq: Once | ORAL | Status: AC
  Administered 2016-01-26: 30 mL via ORAL
  Filled 2016-01-26: qty 30

## 2016-01-26 MED ORDER — SODIUM CHLORIDE 0.9 % IV SOLN
1000.0000 mL | Freq: Once | INTRAVENOUS | Status: AC
  Administered 2016-01-26: 1000 mL via INTRAVENOUS

## 2016-01-26 MED ORDER — OMEPRAZOLE 20 MG PO CPDR: 20.0000 mg | DELAYED_RELEASE_CAPSULE | Freq: Every day | ORAL | 0 refills | Status: DC

## 2016-01-26 MED ORDER — ONDANSETRON HCL 4 MG/2ML IJ SOLN
4.0000 mg | Freq: Once | INTRAMUSCULAR | Status: AC
  Administered 2016-01-26: 4 mg via INTRAVENOUS
  Filled 2016-01-26: qty 2

## 2016-01-26 MED ORDER — MORPHINE SULFATE (PF) 4 MG/ML IV SOLN
6.0000 mg | Freq: Once | INTRAVENOUS | Status: AC
  Administered 2016-01-26: 6 mg via INTRAVENOUS
  Filled 2016-01-26: qty 2

## 2016-01-26 MED ORDER — SODIUM CHLORIDE 0.9 % IV SOLN
1000.0000 mL | INTRAVENOUS | Status: DC
  Administered 2016-01-26: 1000 mL via INTRAVENOUS

## 2016-01-26 MED ORDER — MORPHINE SULFATE (PF) 4 MG/ML IV SOLN: 6.0000 mg | Freq: Once | INTRAVENOUS | Status: DC

## 2016-01-26 MED ORDER — SUCRALFATE 1 GM/10ML PO SUSP: 1.0000 g | Freq: Three times a day (TID) | ORAL | 0 refills | Status: DC

## 2016-01-26 NOTE — ED Provider Notes (Signed)
[facility] DEPT Provider Note   CSN: [id number] Arrival date & time: 01/26/16  [id number]  By signing my name below, I, Rayna Sexton, attest that this documentation has been prepared under the direction and in the presence of Jola Schmidt, MD. Electronically Signed: Rayna Sexton, ED Scribe. 01/26/16. 9:37 AM.  History   Chief Complaint Chief Complaint  Patient presents with  . Abdominal Pain    HPI HPI Comments: SAIFULLAH MARTELLO is a 65 y.o. male who presents to the Emergency Department by ambulance complaining of n/v x 2 days. He reports associated burning pain "from his stomach up through his throat" which he describes as a "battery acid feeling". His n/v worsens s/p eating and drinking. He reports associated diffuse abdominal pain. He denies a h/o abdominal surgeries. Per EMS, pt was given 4 mg zofran en route and had a CBG of 192. Pt is prescribed subutex for chronic pain management. He denies diarrhea.   The history is provided by the patient. No language interpreter was used.    Past Medical History:  Diagnosis Date  . Anemia of chronic disease   . Anxiety   . Anxiety and depression   . BPH (benign prostatic hypertrophy)   . CAD (coronary artery disease) 11/2011   Stents x 3  . Carpal tunnel syndrome   . COPD (chronic obstructive pulmonary disease) ([place])   . DDD (degenerative disc disease)    knees, hips, shoulders--Dr. Ronnie [facility] prior to this)  . Depression   . Endocarditis 2012  . Exertional dyspnea   . Fibromyalgia   . GERD (gastroesophageal reflux disease)   . H/O hiatal hernia   . Heart murmur   . History of blood transfusion    "gave me too much coumadin and heparin once"  . History of epistaxis   . History of gout   . History of MRSA infection 2012   UTI, bacteremia, and endocarditis  . History of renal failure 2011   "on dialysis 3 times over 2 years"  . HLD (hyperlipidemia)   . Hydronephrosis   . Hypertension   . Hypothyroidism   . Insomnia     . Multiple sclerosis ([place])    with optic neuritis  . Myocardial infarction    "according to stress test; that's news to me"  . Narcolepsy   . Necrotizing fasciitis ([place])   . Neurogenic bladder    chronic indwelling foley catheter-changed monthly  . Optic neuritis   . OSA (obstructive sleep apnea)    "tried CPAP; lost more sleep w/use"  . Osteoarthritis of multiple joints    "everywhere"  . Pulmonary embolism ([place])    "suspected; never confirmed"--? treated with coumadin  . Scoliosis   . Steroid-induced psychosis   . Supraspinatus tendon tear 2012  . Thrombocytopenia [facility])     Patient Active Problem List   Diagnosis Date Noted  . Major depressive disorder, recurrent episode, moderate ([place]) 01/15/2016  . Depression 01/11/2016  . Hyperlipidemia 01/11/2016  . Anxiety 01/11/2016  . Atypical chest pain 04/08/2015  . UTI (urinary tract infection) 04/08/2015  . Chest pain, rule out acute myocardial infarction 04/06/2015  . Chest pain 04/06/2015  . Hydronephrosis 11/18/2014  . Catheter-associated urinary tract infection ([place])   . Malnutrition of moderate degree ([place]) 11/17/2014  . AKI (acute kidney injury) ([place]) 11/16/2014  . Hematuria 11/16/2014  . Generalized weakness 11/16/2014  . FTT (failure to thrive) in adult 11/16/2014  . Acute encephalopathy 11/16/2014  . Anemia of chronic disease 11/16/2014  .  COPD (chronic obstructive pulmonary disease) ([place]) 11/15/2014  . CAD (coronary artery disease) 11/15/2014  . Chronic pain syndrome 04/11/2013  . Multiple sclerosis ([place]) 04/11/2013    Past Surgical History:  Procedure Laterality Date  . CARPAL TUNNEL RELEASE  ~ 2010   left with radius fracture repair  . COLONOSCOPY  2000; 2005; 03/15/2011   diminutve adenoma--recall 2017  . CORONARY ANGIOPLASTY  11/22/2011   3 vessels  . CORONARY ANGIOPLASTY WITH STENT PLACEMENT  11/22/2011   DES to LAD  . KNEE ARTHROSCOPY W/ MENISCAL REPAIR  1990's   right  . LEFT HEART CATHETERIZATION  WITH CORONARY ANGIOGRAM N/A 11/22/2011   Procedure: LEFT HEART CATHETERIZATION WITH CORONARY ANGIOGRAM;  Surgeon: Laverda Page, MD;  Location[facility] CATH LAB;  Service: Cardiovascular;  Laterality: N/A;  . nasal cautery    . PERCUTANEOUS CORONARY STENT INTERVENTION (PCI-S)  11/22/2011   Procedure: PERCUTANEOUS CORONARY STENT INTERVENTION (PCI-S);  Surgeon: Laverda Page, MD;  Location: [facility] CATH LAB;  Service: Cardiovascular;;  . SKIN GRAFT  04/2009   right thigh to right "wrist all the way to my shoulder; necrotizing fasciitis       Home Medications    Prior to Admission medications   Medication Sig Start Date End Date Taking? Authorizing Provider  albuterol (PROVENTIL) (2.5 MG/3ML) 0.083% nebulizer solution Take 2.5 mg by nebulization every 6 (six) hours as needed for wheezing or shortness of breath.    Historical Provider, MD  APPLE CIDER VINEGAR PO Take 1 tablet by mouth daily.    Historical Provider, MD  ARIPiprazole (ABILIFY) 2 MG tablet Take 1 tablet (2 mg total) by mouth daily. 01/15/16 01/14/17  Norman Clay, MD  aspirin 81 MG chewable tablet Chew 81 mg by mouth daily.     Historical Provider, MD  atorvastatin (LIPITOR) 10 MG tablet Take 10 mg by mouth daily.    Historical Provider, MD  b complex vitamins tablet Take 1 tablet by mouth daily.    Historical Provider, MD  Buprenorphine HCl-Naloxone HCl (SUBOXONE) 12-3 MG FILM Place 1 Film under the tongue daily.    Historical Provider, MD  carvedilol (COREG) 6.25 MG tablet Take 6.25 mg by mouth 2 (two) times daily.     Historical Provider, MD  Cholecalciferol (VITAMIN D PO) Take 1 tablet by mouth daily.    Historical Provider, MD  Chromium 200 MCG CAPS Take 1 capsule by mouth daily.     Historical Provider, MD  cloNIDine (CATAPRES) 0.1 MG tablet Take 0.1 mg by mouth at bedtime.    Historical Provider, MD  DULoxetine (CYMBALTA) 60 MG capsule TAKE (1) CAPSULE BY MOUTH TWICE DAILY. 01/11/16   [name], MD  fentaNYL (DURAGESIC -  DOSED MCG/HR) 100 MCG/HR Place 175 mcg onto the skin every 3 (three) days. Uses with 75 mcg patch to total 175 mcg.    Historical Provider, MD  Fish Oil-Cholecalciferol (FISH OIL + D3 PO) Take 1 capsule by mouth daily.     Historical Provider, MD  Fluticasone-Salmeterol (ADVAIR) 250-50 MCG/DOSE AEPB Inhale 1 puff into the lungs 2 (two) times daily as needed.     Historical Provider, MD  hydrOXYzine (ATARAX/VISTARIL) 25 MG tablet Take 1 tablet (25 mg total) by mouth 3 (three) times daily as needed for anxiety. 01/15/16 02/14/16  Norman Clay, MD  levalbuterol [facility] HFA) 45 MCG/ACT inhaler Inhale 2 puffs into the lungs every 4 (four) hours as needed for wheezing.    Historical Provider, MD  Multiple Vitamins-Minerals (THEREMS-M) TABS Take  1 tablet by mouth daily.    Historical Provider, MD  nitroGLYCERIN (NITROSTAT) 0.3 MG SL tablet Place 0.4 mg under the tongue every 5 (five) minutes as needed for chest pain.    Historical Provider, MD  NUVIGIL 250 MG tablet TAKE 1 TABLET BY MOUTH TWICE DAILY. Patient taking differently: TAKE 1 TABLET DAILY 10/02/15   Wardell Honour, MD  VITAMIN A PO Take 1 tablet by mouth daily.    Historical Provider, MD  vitamin C (ASCORBIC ACID) 500 MG tablet Take 1,000 mg by mouth 2 (two) times daily.     Historical Provider, MD  VITAMIN E PO Take 1 tablet by mouth daily.    Historical Provider, MD    Family History Family History  Problem Relation Age of Onset  . Alzheimer's disease Mother   . Colon cancer Neg Hx     Social History Social History  Substance Use Topics  . Smoking status: Former Smoker    Packs/day: 0.00    Years: 40.00    Quit date: 08/15/2014  . Smokeless tobacco: Never Used     Comment: Counseling sheet to quit smoking given in exam room   . Alcohol use No     Comment: 11/22/2011 "like crazy til 1988; nothing since"     Allergies   Ultram [tramadol hcl] and Gabapentin   Review of Systems Review of Systems  Constitutional: Positive for  fatigue.  Gastrointestinal: Positive for abdominal pain, nausea and vomiting. Negative for diarrhea.   Physical Exam Updated Vital Signs BP 111/74 (BP Location: Left Arm)   Pulse 84   Temp 98.5 F (36.9 C) (Oral)   Resp 14   Ht 5\' 8"  (1.727 m)   Wt 200 lb (90.7 kg)   SpO2 96%   BMI 30.41 kg/m   Physical Exam  Constitutional: He is oriented to person, place, and time. He appears well-developed and well-nourished.  HENT:  Head: Normocephalic and atraumatic.  Eyes: EOM are normal.  Neck: Normal range of motion.  Cardiovascular: Normal rate, regular rhythm, normal heart sounds and intact distal pulses.   Pulmonary/Chest: Effort normal and breath sounds normal. No respiratory distress.  Abdominal: Soft. He exhibits no distension. There is tenderness.  Mild generalized abdominal tenderness  Musculoskeletal: Normal range of motion.  Neurological: He is alert and oriented to person, place, and time.  Skin: Skin is warm and dry.  Psychiatric: He has a normal mood and affect. Judgment normal.  Nursing note and vitals reviewed.   ED Treatments / Results  Labs (all labs ordered are listed, but only abnormal results are displayed) Labs Reviewed  CBC WITH DIFFERENTIAL/PLATELET - Abnormal; Notable for the following:       Result Value   WBC 19.6 (*)    Neutro Abs 16.1 (*)    Monocytes Absolute 1.5 (*)    All other components within normal limits  COMPREHENSIVE METABOLIC PANEL - Abnormal; Notable for the following:    Chloride 96 (*)    Glucose, Bld 146 (*)    BUN 30 (*)    Creatinine, Ser 1.38 (*)    GFR calc non Af Amer 53 (*)    All other components within normal limits  LIPASE, BLOOD    EKG  EKG Interpretation None       Radiology Ct Abdomen Pelvis Wo Contrast  Result Date: 01/26/2016 CLINICAL DATA:  Abdominal pain, patient states it feels like battery acid in his stomach, history fibromyalgia, multiple sclerosis, pulmonary embolism, COPD, coronary artery  disease  post MI and coronary PTCA, hiatal hernia, GERD EXAM: CT ABDOMEN AND PELVIS WITHOUT CONTRAST TECHNIQUE: Multidetector CT imaging of the abdomen and pelvis was performed following the standard protocol without IV contrast. Sagittal and coronal MPR images reconstructed from axial data set. Patient refused oral contrast. COMPARISON:  11/2014 FINDINGS: Lower chest: Emphysematous changes with minimal atelectasis and scarring at lung bases RIGHT greater than LEFT. Hepatobiliary: Dependent density within gallbladder question sludge versus noncalcified stones. Liver unremarkable. No biliary dilatation. Pancreas: Partial fatty replacement without gross mass or calcification Spleen: Normal appearance Adrenals/Urinary Tract: Adrenal glands normal appearance. Mildly prominent extrarenal pelvis RIGHT kidney. Kidneys in ureters otherwise normal appearance. Foley catheter decompresses urinary bladder. Unable to exclude bladder wall thickening in this setting. Stomach/Bowel: Normal appendix. Small hiatal hernia. Distal antral and pyloric wall thickening question due to gastritis, mass or ulcer. Poor definition of the wall of the descending duodenum which could reflect duodenitis. Stomach and bowel loops otherwise unremarkable for technique. Vascular/Lymphatic: Atherosclerotic calcifications. Aorta normal caliber. No adenopathy. Reproductive: N/A Other: No free air or free fluid.  No extraluminal gas or hernia. Musculoskeletal: Mild scattered degenerative disc and facet disease changes lumbar spine. IMPRESSION: Gastric antral/ pyloric wall thickening which could reflect severe gastritis, ulcer, or mass ; correlation with upper endoscopy recommended to exclude gastric neoplasm. Suspected mild duodenitis. Small hiatal hernia. Emphysematous changes with minimal atelectasis and scarring at lung bases. Decompressed urinary bladder, poorly assessed in this patient with a history of bladder cancer, unable to exclude wall thickening/mass.  Dependent density in gallbladder question sludge versus noncalcified stones; this could be better assessed by sonography. Electronically Signed   By: Lavonia Dana M.D.   On: 01/26/2016 12:21   US Abdomen Limited Ruq  Result Date: 01/26/2016 CLINICAL DATA:  Nausea, vomiting, and abdominal pain beginning 4 days ago. Epigastric/mid abdominal pain. EXAM: US ABDOMEN LIMITED - RIGHT UPPER QUADRANT COMPARISON:  CT abdomen and pelvis 01/26/2016 FINDINGS: Gallbladder: Small amount of layering sludge in the gallbladder. No gallstones or wall thickening visualized. No sonographic Murphy sign noted by sonographer. Common bile duct: Diameter: 5 mm Liver: No focal lesion identified. Within normal limits in parenchymal echogenicity. IMPRESSION: Small amount of gallbladder sludge. No evidence of gallstones or acute cholecystitis. Electronically Signed   By: Logan Bores M.D.   On: 01/26/2016 13:46    Procedures Procedures  DIAGNOSTIC STUDIES: Oxygen Saturation is 96% on RA, normal by my interpretation.    COORDINATION OF CARE: 9:36 AM Discussed next steps with pt. Pt verbalized understanding and is agreeable with the plan.    Medications Ordered in ED Medications  0.9 %  sodium chloride infusion (0 mLs Intravenous Stopped 01/26/16 1051)    Followed by  0.9 %  sodium chloride infusion (0 mLs Intravenous Stopped 01/26/16 1459)  ondansetron (ZOFRAN) injection 4 mg (4 mg Intravenous Given 01/26/16 0950)  morphine 4 MG/ML injection 6 mg (6 mg Intravenous Given 01/26/16 0950)  morphine 4 MG/ML injection 6 mg (6 mg Intravenous Given 01/26/16 1052)  alum & mag hydroxide-simeth (MAALOX/MYLANTA) 200-200-20 MG/5ML suspension 30 mL (30 mLs Oral Given 01/26/16 1458)    Initial Impression / Assessment and Plan / ED Course  I have reviewed the triage vital signs and the nursing notes.  Pertinent labs & imaging results that were available during my care of the patient were reviewed by me and considered in my medical  decision making (see chart for details).  Clinical Course    3:25 PM Patient feeling better at  this time.  Discharge home in good condition.  Suspected gastroesophageal reflux disease/gastritis.  Patient started on PPI as well as Carafate.  Outpatient GI follow-up.  He understands return to the ER for new or worsening symptoms.  No right upper quadrant pain at this time.  Gallbladder besides a small amount of sludge appears otherwise normal with no thickened gallbladder wall.  Doubt cholecystitis.  No pericholecystic fluid.  White count noted and nonspecific.  Patient will need outpatient GI and primary care follow-up.  He understands to return to the ER for new or worsening symptoms.   I personally performed the services described in this documentation, which was scribed in my presence. The recorded information has been reviewed and is accurate.      Final Clinical Impressions(s) / ED Diagnoses   Final diagnoses:  Vomiting  Abdominal pain, unspecified abdominal location  Gastroesophageal reflux disease, esophagitis presence not specified    New Prescriptions New Prescriptions   ONDANSETRON (ZOFRAN ODT) 8 MG DISINTEGRATING TABLET    Take 1 tablet (8 mg total) by mouth every 8 (eight) hours as needed for nausea or vomiting.   SUCRALFATE (CARAFATE) 1 GM/10ML SUSPENSION    Take 10 mLs (1 g total) by mouth 4 (four) times daily -  with meals and at bedtime.     Jola Schmidt, MD 01/26/16 [phone]

## 2016-01-26 NOTE — ED Triage Notes (Signed)
Pt c/o n/v/ and abd pain since Friday.  EMS administered 4mg  zofran pta.  cbg 192.

## 2016-02-01 ENCOUNTER — Telehealth: Payer: Self-pay | Admitting: Nurse Practitioner

## 2016-02-01 NOTE — Telephone Encounter (Signed)
Patient seen recently in the ER and was referred to follow up with [facility]. Please advise if we can accept as new patient

## 2016-02-02 NOTE — Telephone Encounter (Signed)
Patient is an established patient with [facility] and should seek follow-up at that office.

## 2016-02-02 NOTE — Telephone Encounter (Signed)
Has previously seen [facility] (last in 2012 with colonoscopy). Will forward to [place] for her input.

## 2016-02-03 NOTE — Telephone Encounter (Signed)
Pt is aware.  

## 2016-02-04 ENCOUNTER — Other Ambulatory Visit: Payer: Self-pay | Admitting: Family Medicine

## 2016-02-11 ENCOUNTER — Ambulatory Visit (HOSPITAL_COMMUNITY): Payer: Self-pay | Admitting: Psychiatry

## 2016-02-11 ENCOUNTER — Other Ambulatory Visit: Payer: Self-pay | Admitting: Family Medicine

## 2016-02-11 NOTE — Progress Notes (Deleted)
[facility] MD/PA/NP OP Progress Note  02/11/2016 1:10 PM TIERNEY HEMMERLING  MRN:  [id number]  Chief Complaint:  Subjective:  *** HPI: *** Visit Diagnosis: No diagnosis found.  Past Psychiatric History:  Past Psychiatric History:Seen psychiatrist for year in 1990's for depression when he started to have weakness Psychiatry admission: [place] in 2011, "manic" was on pain medication, steroids Used to see a therapist at nursing home 2 years ago Denies any suicide attempt  Previous Psychotropic Medications: Yes  He has been on duloxetine 60 mg for years (he has not tried higher dose before). He has tried Zoloftin 1993, and Bupropion for smoking cessation. He has been on Nuvigil for narcolepsy. He used to be on Xanax and clonazepam but it was discontinued. He had tried PT in the past with limited improvement.   Past Medical History:  Past Medical History:  Diagnosis Date  . Anemia of chronic disease   . Anxiety   . Anxiety and depression   . BPH (benign prostatic hypertrophy)   . CAD (coronary artery disease) 11/2011   Stents x 3  . Carpal tunnel syndrome   . COPD (chronic obstructive pulmonary disease) ([place])   . DDD (degenerative disc disease)    knees, hips, shoulders--Dr. Ronnie Derby [facility] prior to this)  . Depression   . Endocarditis 2012  . Exertional dyspnea   . Fibromyalgia   . GERD (gastroesophageal reflux disease)   . H/O hiatal hernia   . Heart murmur   . History of blood transfusion    "gave me too much coumadin and heparin once"  . History of epistaxis   . History of gout   . History of MRSA infection 2012   UTI, bacteremia, and endocarditis  . History of renal failure 2011   "on dialysis 3 times over 2 years"  . HLD (hyperlipidemia)   . Hydronephrosis   . Hypertension   . Hypothyroidism   . Insomnia   . Multiple sclerosis ([place])    with optic neuritis  . Myocardial infarction    "according to stress test; that's news to me"  . Narcolepsy   . Necrotizing fasciitis  ([place])   . Neurogenic bladder    chronic indwelling foley catheter-changed monthly  . Optic neuritis   . OSA (obstructive sleep apnea)    "tried CPAP; lost more sleep w/use"  . Osteoarthritis of multiple joints    "everywhere"  . Pulmonary embolism ([place])    "suspected; never confirmed"--? treated with coumadin  . Scoliosis   . Steroid-induced psychosis   . Supraspinatus tendon tear 2012  . Thrombocytopenia ([place])     Past Surgical History:  Procedure Laterality Date  . CARPAL TUNNEL RELEASE  ~ 2010   left with radius fracture repair  . COLONOSCOPY  2000; 2005; 03/15/2011   diminutve adenoma--recall 2017  . CORONARY ANGIOPLASTY  11/22/2011   3 vessels  . CORONARY ANGIOPLASTY WITH STENT PLACEMENT  11/22/2011   DES to LAD  . KNEE ARTHROSCOPY W/ MENISCAL REPAIR  1990's   right  . LEFT HEART CATHETERIZATION WITH CORONARY ANGIOGRAM N/A 11/22/2011   Procedure: LEFT HEART CATHETERIZATION WITH CORONARY ANGIOGRAM;  Surgeon: Laverda Page, MD;  Location[facility] CATH LAB;  Service: Cardiovascular;  Laterality: N/A;  . nasal cautery    . PERCUTANEOUS CORONARY STENT INTERVENTION (PCI-S)  11/22/2011   Procedure: PERCUTANEOUS CORONARY STENT INTERVENTION (PCI-S);  Surgeon: Laverda Page, MD;  Location: [facility] CATH LAB;  Service: Cardiovascular;;  . SKIN GRAFT  04/2009  right thigh to right "wrist all the way to my shoulder; necrotizing fasciitis    Family Psychiatric History: denies MH diagnosis, denies suicide attempt  Family History:  Family History  Problem Relation Age of Onset  . Alzheimer's disease Mother   . Colon cancer Neg Hx     Social History:  Social History   Social History  . Marital status: Single    Spouse name: N/A  . Number of children: N/A  . Years of education: N/A   Social History Main Topics  . Smoking status: Former Smoker    Packs/day: 0.00    Years: 40.00    Quit date: 08/15/2014  . Smokeless tobacco: Never Used     Comment: Counseling sheet to quit smoking  given in exam room   . Alcohol use No     Comment: 11/22/2011 "like crazy til 1988; nothing since"  . Drug use: No     Comment: *8//13/13 "1986 til 1988 like crazy; nothing since"  . Sexual activity: Not Currently   Other Topics Concern  . Not on file   Social History Narrative   Never married, no children.   Orig from [place], [place], lived a long time in [place].   Has been in [place] since about 2004.   College at [employer] "for years".   Denies alcohol or drug use.   Cigarettes: 50 pack-yr hx--current as of 03/2013.    Allergies:  Allergies  Allergen Reactions  . Ultram [Tramadol Hcl] Other (See Comments)    Seizures; "grand mal; twice"  . Gabapentin Other (See Comments)    Made pt dilusional    Metabolic Disorder Labs: Lab Results  Component Value Date   HGBA1C  08/29/2009    5.6 (NOTE)                                                                       According to the ADA Clinical Practice Recommendations for 2011, when HbA1c is used as a screening test:   >=6.5%   Diagnostic of Diabetes Mellitus           (if abnormal result  is confirmed)  5.7-6.4%   Increased risk of developing Diabetes Mellitus  References:Diagnosis and Classification of Diabetes Mellitus,Diabetes [id number] 1):S62-S69 and Standards of Medical Care in         Diabetes - 2011,Diabetes Care,2011,34  (Suppl 1):S11-S61.   MPG  08/29/2009    114 (NOTE) *** Please note change in reference range(s). ***    No results found for: PROLACTIN Lab Results  Component Value Date   CHOL 111 09/29/2010   TRIG 130 09/29/2010   HDL 17 (L) 09/29/2010   CHOLHDL 6.5 09/29/2010   VLDL 26 09/29/2010   LDLCALC 68 09/29/2010   LDLCALC  08/21/2009    57        Total Cholesterol/HDL:CHD Risk Coronary Heart Disease Risk Table                     Men   Women  1/2 Average Risk   3.4   3.3  Average Risk       5.0   4.4  2 X Average Risk   9.6   7.1  3 X Average  Risk  23.4   11.0        Use the calculated  Patient Ratio above and the CHD Risk Table to determine the patient's CHD Risk.        ATP III CLASSIFICATION (LDL):  <100     mg/dL   Optimal  100-129  mg/dL   Near or Above                    Optimal  130-159  mg/dL   Borderline  160-189  mg/dL   High  >190     mg/dL   Very High     Current Medications: Current Outpatient Prescriptions  Medication Sig Dispense Refill  . albuterol (PROVENTIL) (2.5 MG/3ML) 0.083% nebulizer solution Take 2.5 mg by nebulization every 6 (six) hours as needed for wheezing or shortness of breath.    . ARIPiprazole (ABILIFY) 2 MG tablet Take 1 tablet (2 mg total) by mouth daily. 30 tablet 0  . aspirin 81 MG chewable tablet Chew 81 mg by mouth daily.     Marland Kitchen atorvastatin (LIPITOR) 10 MG tablet Take 10 mg by mouth daily.    Marland Kitchen b complex vitamins tablet Take 1 tablet by mouth daily.    . Buprenorphine HCl-Naloxone HCl (SUBOXONE) 12-3 MG FILM Place 1 Film under the tongue daily.    . DULoxetine (CYMBALTA) 60 MG capsule TAKE (1) CAPSULE BY MOUTH TWICE DAILY. 60 capsule 0  . Fluticasone-Salmeterol (ADVAIR) 250-50 MCG/DOSE AEPB Inhale 1 puff into the lungs 2 (two) times daily as needed.     . hydrOXYzine (ATARAX/VISTARIL) 25 MG tablet Take 1 tablet (25 mg total) by mouth 3 (three) times daily as needed for anxiety. 90 tablet 0  . levalbuterol (XOPENEX HFA) 45 MCG/ACT inhaler Inhale 2 puffs into the lungs every 4 (four) hours as needed for wheezing.    . Multiple Vitamins-Minerals (THEREMS-M) TABS Take 1 tablet by mouth daily.    . nitroGLYCERIN (NITROSTAT) 0.3 MG SL tablet Place 0.4 mg under the tongue every 5 (five) minutes as needed for chest pain.    Marland Kitchen NUVIGIL 250 MG tablet TAKE 1 TABLET BY MOUTH TWICE DAILY. (Patient taking differently: TAKE 1 TABLET DAILY) 60 tablet 0  . omeprazole (PRILOSEC) 20 MG capsule Take 1 capsule (20 mg total) by mouth daily. 60 capsule 0  . ondansetron (ZOFRAN ODT) 8 MG disintegrating tablet Take 1 tablet (8 mg total) by mouth every 8  (eight) hours as needed for nausea or vomiting. 10 tablet 0  . sucralfate (CARAFATE) 1 GM/10ML suspension Take 10 mLs (1 g total) by mouth 4 (four) times daily -  with meals and at bedtime. 420 mL 0  . VITAMIN A PO Take 1 tablet by mouth daily.    . vitamin C (ASCORBIC ACID) 500 MG tablet Take 1,000 mg by mouth 2 (two) times daily.     Marland Kitchen VITAMIN E PO Take 1 tablet by mouth daily.     No current facility-administered medications for this visit.     Neurologic: Headache: {BHH YES OR NO:22294} Seizure: {BHH YES OR NO:22294} Paresthesias: {BHH YES OR NO:22294}  Musculoskeletal: Strength & Muscle Tone: {desc; muscle tone:32375} Gait & Station: {PE GAIT ED QX:[id number] Patient leans: {Patient Leans:21022755}  Psychiatric Specialty Exam: ROS  There were no vitals taken for this visit.There is no height or weight on file to calculate BMI.  General Appearance: {Appearance:22683}  Eye Contact:  {BHH EYE CONTACT:22684}  Speech:  {Speech:22685}  Volume:  {Volume (PAA):22686}  Mood:  {BHH MOOD:22306}  Affect:  {Affect (PAA):22687}  Thought Process:  {Thought Process (PAA):22688}  Orientation:  {BHH ORIENTATION (PAA):22689}  Thought Content: {Thought Content:22690}   Suicidal Thoughts:  {ST/HT (PAA):22692}  Homicidal Thoughts:  {ST/HT (PAA):22692}  Memory:  {BHH MEMORY:22881}  Judgement:  {Judgement (PAA):22694}  Insight:  {Insight (PAA):22695}  Psychomotor Activity:  {Psychomotor (PAA):22696}  Concentration:  {Concentration:21399}  Recall:  {BHH GOOD/FAIR/POOR:22877}  Fund of Knowledge: {BHH GOOD/FAIR/POOR:22877}  Language: {BHH GOOD/FAIR/POOR:22877}  Akathisia:  {BHH YES OR NO:22294}  Handed:  {Handed:22697}  AIMS (if indicated):  ***  Assets:  {Assets (PAA):22698}  ADL's:  {BHH TW:9249394  Cognition: {chl bhh cognition:304700322}  Sleep:  ***   Assessment Mr.Wagoneris a 65 y.o.malewith depression, anxiety, chronic pain syndrome, multiple sclerosis, CAD with PTCA in 11/2011,  Stent to LAD, hypertension, hypercholesterolemia who presented to the clinic for depression.   # MDD There has been a slight improvement in his neurovegetative symptoms, which coincided with starting Abilify. Will continue current regimen. He was advised to contact with a therapist he used to see to re-establish care for psychotherapy. It is noted that routine follow up visit with his PCP is strongly recommended for this patient to ameliorate his chronic physical symptoms. He declines the option to see PT but agreed for behavioral activation.   Plans - Continue duloxetine 60 mg daily - Continue Abilify 2 mg daily - hydroxyzine 25 mg TID PRN for anxiety - Return to clinic in one month - Patient to contact a therapist for psychotherapy; recommended CBT  The patient demonstrates the following risk factors for suicide: Chronic risk factorsfor suicide include psychiatric disorder of depression,medical illnessof chronic fatigue syndrome/chronic pain, demographic factors (male, >60yo), completed suicide in a family member, Acute risk factorsfor suicide includeN/A. Protective factorsfor this patient include positive social support, positive therapeutic relationship, coping skills, hope for the future. Considering these factors, the overall suicide risk at this point appears to be low.   Treatment Plan Summary:Medication management  Treatment Plan Summary:{[facility] MD [place] OC:[id number]   Norman Clay, MD 02/11/2016, 1:10 PM

## 2016-02-16 NOTE — Progress Notes (Signed)
[facility] MD/PA/NP OP Progress Note  02/18/2016 9:38 AM [facility]  MRN:  [id number]  Chief Complaint:  Chief Complaint    Follow-up; Depression     Subjective:  "I was doing worse" HPI:  Patient presents for follow up appointment. He states that he was anxious and had a panic attack a couple of times. He discontinued Abilify and hydroxyzine, wondering it might be the cause of it. He states that he realized that he was not on carvedilol and he restarted it since last night. He feels good good today and restarted both Abilify and hydroxyzine. He decided not to go to a local pain clinic as he did not find the treatment to be helpful. He discontinued Suboxone and was prescribed on hydrocodone.   He denies insomnia. He had SI last week but denies any this week or today. He denies AH/VH. He endorses back pain. He denies dizziness.   Visit Diagnosis:    ICD-9-CM ICD-10-CM   1. Major depressive disorder, recurrent episode, moderate (HCC) 296.32 F33.1     Past Psychiatric History:  Past Psychiatric History:Seen psychiatrist for year in 1990's for depression when he started to have weakness Psychiatry admission: [place] in 2011, "manic" was on pain medication, steroids Used to see a therapist at nursing home 2 years ago Denies any suicide attempt  Previous Psychotropic Medications: Yes  He has been on duloxetine 60 mg for years (he has not tried higher dose before). He has tried Zoloftin 1993, and Bupropion for smoking cessation. He has been on Nuvigil for narcolepsy. He used to be on Xanax and clonazepam but it was discontinued. He had tried PT in the past with limited improvement.   Past Medical History:  Past Medical History:  Diagnosis Date  . Anemia of chronic disease   . Anxiety   . Anxiety and depression   . BPH (benign prostatic hypertrophy)   . CAD (coronary artery disease) 11/2011   Stents x 3  . Carpal tunnel syndrome   . COPD (chronic obstructive pulmonary disease) ([place])    . DDD (degenerative disc disease)    knees, hips, shoulders--Dr. Ronnie Derby [facility] prior to this)  . Depression   . Endocarditis 2012  . Exertional dyspnea   . Fibromyalgia   . GERD (gastroesophageal reflux disease)   . H/O hiatal hernia   . Heart murmur   . History of blood transfusion    "gave me too much coumadin and heparin once"  . History of epistaxis   . History of gout   . History of MRSA infection 2012   UTI, bacteremia, and endocarditis  . History of renal failure 2011   "on dialysis 3 times over 2 years"  . HLD (hyperlipidemia)   . Hydronephrosis   . Hypertension   . Hypothyroidism   . Insomnia   . Multiple sclerosis ([place])    with optic neuritis  . Myocardial infarction    "according to stress test; that's news to me"  . Narcolepsy   . Necrotizing fasciitis ([place])   . Neurogenic bladder    chronic indwelling foley catheter-changed monthly  . Optic neuritis   . OSA (obstructive sleep apnea)    "tried CPAP; lost more sleep w/use"  . Osteoarthritis of multiple joints    "everywhere"  . Pulmonary embolism ([place])    "suspected; never confirmed"--? treated with coumadin  . Scoliosis   . Steroid-induced psychosis   . Supraspinatus tendon tear 2012  . Thrombocytopenia ([place])  Past Surgical History:  Procedure Laterality Date  . CARPAL TUNNEL RELEASE  ~ 2010   left with radius fracture repair  . COLONOSCOPY  2000; 2005; 03/15/2011   diminutve adenoma--recall 2017  . CORONARY ANGIOPLASTY  11/22/2011   3 vessels  . CORONARY ANGIOPLASTY WITH STENT PLACEMENT  11/22/2011   DES to LAD  . KNEE ARTHROSCOPY W/ MENISCAL REPAIR  1990's   right  . LEFT HEART CATHETERIZATION WITH CORONARY ANGIOGRAM N/A 11/22/2011   Procedure: LEFT HEART CATHETERIZATION WITH CORONARY ANGIOGRAM;  Surgeon: Laverda Page, MD;  Location: [facility] CATH LAB;  Service: Cardiovascular;  Laterality: N/A;  . nasal cautery    . PERCUTANEOUS CORONARY STENT INTERVENTION (PCI-S)  11/22/2011   Procedure:  PERCUTANEOUS CORONARY STENT INTERVENTION (PCI-S);  Surgeon: Laverda Page, MD;  Location: [facility] CATH LAB;  Service: Cardiovascular;;  . SKIN GRAFT  04/2009   right thigh to right "wrist all the way to my shoulder; necrotizing fasciitis    Family Psychiatric History: denies MH diagnosis, denies suicide attempt  Family History:  Family History  Problem Relation Age of Onset  . Alzheimer's disease Mother   . Colon cancer Neg Hx     Social History:  Social History   Social History  . Marital status: Single    Spouse name: N/A  . Number of children: N/A  . Years of education: N/A   Social History Main Topics  . Smoking status: Former Smoker    Packs/day: 0.00    Years: 40.00    Quit date: 08/15/2014  . Smokeless tobacco: Never Used     Comment: Counseling sheet to quit smoking given in exam room   . Alcohol use No     Comment: 11/22/2011 "like crazy til 1988; nothing since"  . Drug use: No     Comment: *8//13/13 "1986 til 1988 like crazy; nothing since"  . Sexual activity: Not Currently   Other Topics Concern  . None   Social History Narrative   Never married, no children.   Orig from [place], [place], lived a long time in [place].   Has been in [place] since about 2004.   College at [employer] "for years".   Denies alcohol or drug use.   Cigarettes: 50 pack-yr hx--current as of 03/2013.    Allergies:  Allergies  Allergen Reactions  . Ultram [Tramadol Hcl] Other (See Comments)    Seizures; "grand mal; twice"  . Gabapentin Other (See Comments)    Made pt dilusional    Metabolic Disorder Labs:  No results found for: PROLACTIN Lab Results  Component Value Date   CHOL 111 09/29/2010   TRIG 130 09/29/2010   HDL 17 (L) 09/29/2010   CHOLHDL 6.5 09/29/2010   VLDL 26 09/29/2010   LDLCALC 68 09/29/2010   LDLCALC  08/21/2009    57        Total Cholesterol/HDL:CHD Risk Coronary Heart Disease Risk Table                     Men   Women  1/2 Average Risk   3.4   3.3   Average Risk       5.0   4.4  2 X Average Risk   9.6   7.1  3 X Average Risk  23.4   11.0        Use the calculated Patient Ratio above and the CHD Risk Table to determine the patient's CHD Risk.        ATP  III CLASSIFICATION (LDL):  <100     mg/dL   Optimal  100-129  mg/dL   Near or Above                    Optimal  130-159  mg/dL   Borderline  160-189  mg/dL   High  >190     mg/dL   Very High     Current Medications: Current Outpatient Prescriptions  Medication Sig Dispense Refill  . albuterol (PROVENTIL) (2.5 MG/3ML) 0.083% nebulizer solution Take 2.5 mg by nebulization every 6 (six) hours as needed for wheezing or shortness of breath.    . ARIPiprazole (ABILIFY) 2 MG tablet Take 1 tablet (2 mg total) by mouth daily. 30 tablet 2  . aspirin 81 MG chewable tablet Chew 81 mg by mouth daily.     Marland Kitchen atorvastatin (LIPITOR) 10 MG tablet Take 10 mg by mouth daily.    Marland Kitchen b complex vitamins tablet Take 1 tablet by mouth daily.    . carvedilol (COREG) 6.25 MG tablet Take 6.25 mg by mouth 2 (two) times daily.    . DULoxetine (CYMBALTA) 60 MG capsule TAKE (1) CAPSULE BY MOUTH TWICE DAILY. 60 capsule 2  . Fluticasone-Salmeterol (ADVAIR) 250-50 MCG/DOSE AEPB Inhale 1 puff into the lungs 2 (two) times daily as needed.     . hydrOXYzine (ATARAX/VISTARIL) 25 MG tablet Take 1 tablet (25 mg total) by mouth 3 (three) times daily as needed. 90 tablet 2  . levalbuterol (XOPENEX HFA) 45 MCG/ACT inhaler Inhale 2 puffs into the lungs every 4 (four) hours as needed for wheezing.    . Multiple Vitamins-Minerals (THEREMS-M) TABS Take 1 tablet by mouth daily.    . nitroGLYCERIN (NITROSTAT) 0.3 MG SL tablet Place 0.4 mg under the tongue every 5 (five) minutes as needed for chest pain.    Marland Kitchen NUVIGIL 250 MG tablet TAKE 1 TABLET BY MOUTH TWICE DAILY. (Patient taking differently: TAKE 1 TABLET DAILY) 60 tablet 0  . omeprazole (PRILOSEC) 20 MG capsule Take 1 capsule (20 mg total) by mouth daily. 60 capsule 0  .  sucralfate (CARAFATE) 1 GM/10ML suspension Take 10 mLs (1 g total) by mouth 4 (four) times daily -  with meals and at bedtime. 420 mL 0  . VITAMIN A PO Take 1 tablet by mouth daily.    . vitamin C (ASCORBIC ACID) 500 MG tablet Take 1,000 mg by mouth 2 (two) times daily.     Marland Kitchen VITAMIN E PO Take 1 tablet by mouth daily.    . ondansetron (ZOFRAN ODT) 8 MG disintegrating tablet Take 1 tablet (8 mg total) by mouth every 8 (eight) hours as needed for nausea or vomiting. (Patient not taking: Reported on 02/18/2016) 10 tablet 0   No current facility-administered medications for this visit.     Neurologic: Headache: No Seizure: Negative Paresthesias: No  Musculoskeletal: Strength & Muscle Tone: within normal limits Gait & Station: normal Patient leans: N/A  Psychiatric Specialty Exam: Review of Systems  Cardiovascular: Negative for palpitations.  Neurological: Negative for dizziness.  Psychiatric/Behavioral: Positive for depression. Negative for hallucinations, substance abuse and suicidal ideas. The patient is nervous/anxious. The patient does not have insomnia.   All other systems reviewed and are negative.   Blood pressure (!) 82/54, pulse 76, height 5\' 10"  (1.778 m).There is no height or weight on file to calculate BMI.  General Appearance: Casual  Eye Contact:  Good  Speech:  Clear and Coherent  Volume:  Normal  Mood:  Anxious  Affect:  slightly down  Thought Process:  Coherent and Goal Directed  Orientation:  Full (Time, Place, and Person)  Thought Content: Logical  Perceptions: denies AH/VH  Suicidal Thoughts:  No  Homicidal Thoughts:  No  Memory:  Immediate;   Good Recent;   Good Remote;   Good  Judgement:  Fair  Insight:  Fair  Psychomotor Activity:  Normal  Concentration:  Concentration: Good and Attention Span: Good  Recall:  Good  Fund of Knowledge: Good  Language: Good  Akathisia:  No  Handed:  Right  AIMS (if indicated):  N/A  Assets:  Communication  Skills Desire for Improvement  ADL's:  Intact  Cognition: WNL  Sleep:  good   Assessment Mr.Wagoneris a 65 y.o.malewith depression, anxiety, chronic pain syndrome, multiple sclerosis, CAD with PTCA in 11/2011, Stent to LAD, hypertension, hypercholesterolemia who presented to the clinic for depression.   # MDD Patient endorses worsening in his anxiety, which coincided with discontinuing Suboxone. He also reports that he had been off carvedilol since April by mistake. It is likely that his perception of pain exacerbated anxiety. Will continue current medication at this time. Noted that he has been taking duloxetine 120 mg (not 60 mg daily) per report. He was given resources for mindfulness to practice at home. Provide information for [facility] clinic [facility]. It is noted that routine follow up visit with his PCP is strongly recommended for this patient to ameliorate his chronic physical symptoms. He declines the option to see PT but agreed for behavioral activation.   # Hypotension Patient is hypotensive at today's visit. Encouraged hydration and advised to hold carvedilol if SBP<90.  Advised patient to discuss with his PCP for further evaluation. Patient denies dizziness or palpitation on today's evaluation.   Plans - Continue duloxetine 60 mg twice a day - Continue Abilify 2 mg daily - hydroxyzine 25 mg TID PRN for anxiety - Return to clinic in one month - Patient to practice mindfulness - Patient to contact pain clinic  The patient demonstrates the following risk factors for suicide: Chronic risk factorsfor suicide include psychiatric disorder of depression,medical illnessof chronic fatigue syndrome/chronic pain, demographic factors (male, >19 yo), completed suicide in a family member, Acute risk factorsfor suicide includeN/A. Protective factorsfor this patient include positive social support, positive therapeutic relationship, coping skills, hope for the future.  Considering these factors, the overall suicide risk at this point appears to be low.  Treatment Plan Summary:Medication management  Norman Clay, MD 02/18/2016, 9:38 AM

## 2016-02-18 ENCOUNTER — Ambulatory Visit (INDEPENDENT_AMBULATORY_CARE_PROVIDER_SITE_OTHER): Payer: Medicare Other | Admitting: Psychiatry

## 2016-02-18 ENCOUNTER — Ambulatory Visit (HOSPITAL_COMMUNITY): Payer: Self-pay | Admitting: Psychiatry

## 2016-02-18 ENCOUNTER — Encounter (HOSPITAL_COMMUNITY): Payer: Self-pay | Admitting: Psychiatry

## 2016-02-18 VITALS — BP 82/54 | HR 76 | Ht 70.0 in

## 2016-02-18 DIAGNOSIS — Z800 Family history of malignant neoplasm of digestive organs: Secondary | ICD-10-CM | POA: Diagnosis not present

## 2016-02-18 DIAGNOSIS — Z818 Family history of other mental and behavioral disorders: Secondary | ICD-10-CM

## 2016-02-18 DIAGNOSIS — Z87891 Personal history of nicotine dependence: Secondary | ICD-10-CM

## 2016-02-18 DIAGNOSIS — Z888 Allergy status to other drugs, medicaments and biological substances status: Secondary | ICD-10-CM

## 2016-02-18 DIAGNOSIS — F331 Major depressive disorder, recurrent, moderate: Secondary | ICD-10-CM | POA: Diagnosis not present

## 2016-02-18 MED ORDER — ARIPIPRAZOLE 2 MG PO TABS: 2.0000 mg | ORAL_TABLET | Freq: Every day | ORAL | 2 refills | Status: DC

## 2016-02-18 MED ORDER — DULOXETINE HCL 60 MG PO CPEP: ORAL_CAPSULE | ORAL | 2 refills | Status: AC

## 2016-02-18 MED ORDER — HYDROXYZINE HCL 25 MG PO TABS: 25.0000 mg | ORAL_TABLET | Freq: Three times a day (TID) | ORAL | 2 refills | Status: DC | PRN

## 2016-02-18 NOTE — Patient Instructions (Addendum)
1.  Continue duloxetine 60 mg twice a day 2.  Continue Abilify 2 mg daily 3.  Continue hydroxyzine 25 mg TID PRN for anxiety 4.  Return to clinic in one month

## 2016-03-01 ENCOUNTER — Inpatient Hospital Stay (HOSPITAL_COMMUNITY)
Admission: EM | Admit: 2016-03-01 | Discharge: 2016-03-05 | DRG: 377 | Disposition: A | Discharge status: Skilled Nursing Facility | Payer: Medicare Other | Attending: Internal Medicine | Admitting: Internal Medicine

## 2016-03-01 ENCOUNTER — Emergency Department (HOSPITAL_COMMUNITY): Payer: Medicare Other

## 2016-03-01 ENCOUNTER — Encounter (HOSPITAL_COMMUNITY): Payer: Self-pay | Admitting: *Deleted

## 2016-03-01 DIAGNOSIS — Z6827 Body mass index (BMI) 27.0-27.9, adult: Secondary | ICD-10-CM | POA: Diagnosis not present

## 2016-03-01 DIAGNOSIS — R531 Weakness: Secondary | ICD-10-CM

## 2016-03-01 DIAGNOSIS — Z66 Do not resuscitate: Secondary | ICD-10-CM | POA: Diagnosis present

## 2016-03-01 DIAGNOSIS — R0602 Shortness of breath: Secondary | ICD-10-CM | POA: Diagnosis present

## 2016-03-01 DIAGNOSIS — N390 Urinary tract infection, site not specified: Secondary | ICD-10-CM | POA: Diagnosis present

## 2016-03-01 DIAGNOSIS — K254 Chronic or unspecified gastric ulcer with hemorrhage: Secondary | ICD-10-CM | POA: Diagnosis present

## 2016-03-01 DIAGNOSIS — R710 Precipitous drop in hematocrit: Secondary | ICD-10-CM | POA: Diagnosis present

## 2016-03-01 DIAGNOSIS — J449 Chronic obstructive pulmonary disease, unspecified: Secondary | ICD-10-CM | POA: Diagnosis present

## 2016-03-01 DIAGNOSIS — F32A Depression, unspecified: Secondary | ICD-10-CM | POA: Diagnosis present

## 2016-03-01 DIAGNOSIS — Z888 Allergy status to other drugs, medicaments and biological substances status: Secondary | ICD-10-CM

## 2016-03-01 DIAGNOSIS — I130 Hypertensive heart and chronic kidney disease with heart failure and stage 1 through stage 4 chronic kidney disease, or unspecified chronic kidney disease: Secondary | ICD-10-CM | POA: Diagnosis present

## 2016-03-01 DIAGNOSIS — Z7982 Long term (current) use of aspirin: Secondary | ICD-10-CM

## 2016-03-01 DIAGNOSIS — Z936 Other artificial openings of urinary tract status: Secondary | ICD-10-CM

## 2016-03-01 DIAGNOSIS — D638 Anemia in other chronic diseases classified elsewhere: Secondary | ICD-10-CM | POA: Diagnosis present

## 2016-03-01 DIAGNOSIS — G35 Multiple sclerosis: Secondary | ICD-10-CM | POA: Diagnosis present

## 2016-03-01 DIAGNOSIS — R0600 Dyspnea, unspecified: Secondary | ICD-10-CM

## 2016-03-01 DIAGNOSIS — N319 Neuromuscular dysfunction of bladder, unspecified: Secondary | ICD-10-CM | POA: Diagnosis present

## 2016-03-01 DIAGNOSIS — I252 Old myocardial infarction: Secondary | ICD-10-CM

## 2016-03-01 DIAGNOSIS — I5032 Chronic diastolic (congestive) heart failure: Secondary | ICD-10-CM | POA: Diagnosis present

## 2016-03-01 DIAGNOSIS — D649 Anemia, unspecified: Secondary | ICD-10-CM | POA: Diagnosis not present

## 2016-03-01 DIAGNOSIS — Z87891 Personal history of nicotine dependence: Secondary | ICD-10-CM

## 2016-03-01 DIAGNOSIS — Z8614 Personal history of Methicillin resistant Staphylococcus aureus infection: Secondary | ICD-10-CM

## 2016-03-01 DIAGNOSIS — E039 Hypothyroidism, unspecified: Secondary | ICD-10-CM | POA: Diagnosis present

## 2016-03-01 DIAGNOSIS — M797 Fibromyalgia: Secondary | ICD-10-CM | POA: Diagnosis present

## 2016-03-01 DIAGNOSIS — G894 Chronic pain syndrome: Secondary | ICD-10-CM | POA: Diagnosis present

## 2016-03-01 DIAGNOSIS — N183 Chronic kidney disease, stage 3 (moderate): Secondary | ICD-10-CM | POA: Diagnosis present

## 2016-03-01 DIAGNOSIS — D509 Iron deficiency anemia, unspecified: Secondary | ICD-10-CM | POA: Diagnosis not present

## 2016-03-01 DIAGNOSIS — R1013 Epigastric pain: Secondary | ICD-10-CM | POA: Diagnosis not present

## 2016-03-01 DIAGNOSIS — H469 Unspecified optic neuritis: Secondary | ICD-10-CM | POA: Diagnosis present

## 2016-03-01 DIAGNOSIS — I214 Non-ST elevation (NSTEMI) myocardial infarction: Secondary | ICD-10-CM | POA: Diagnosis present

## 2016-03-01 DIAGNOSIS — D500 Iron deficiency anemia secondary to blood loss (chronic): Secondary | ICD-10-CM | POA: Diagnosis not present

## 2016-03-01 DIAGNOSIS — Z7951 Long term (current) use of inhaled steroids: Secondary | ICD-10-CM

## 2016-03-01 DIAGNOSIS — Z79899 Other long term (current) drug therapy: Secondary | ICD-10-CM

## 2016-03-01 DIAGNOSIS — E785 Hyperlipidemia, unspecified: Secondary | ICD-10-CM | POA: Diagnosis present

## 2016-03-01 DIAGNOSIS — R935 Abnormal findings on diagnostic imaging of other abdominal regions, including retroperitoneum: Secondary | ICD-10-CM | POA: Diagnosis not present

## 2016-03-01 DIAGNOSIS — R7989 Other specified abnormal findings of blood chemistry: Secondary | ICD-10-CM

## 2016-03-01 DIAGNOSIS — K210 Gastro-esophageal reflux disease with esophagitis: Secondary | ICD-10-CM | POA: Diagnosis present

## 2016-03-01 DIAGNOSIS — K2980 Duodenitis without bleeding: Secondary | ICD-10-CM | POA: Diagnosis present

## 2016-03-01 DIAGNOSIS — R627 Adult failure to thrive: Secondary | ICD-10-CM | POA: Diagnosis present

## 2016-03-01 DIAGNOSIS — R109 Unspecified abdominal pain: Secondary | ICD-10-CM | POA: Diagnosis present

## 2016-03-01 DIAGNOSIS — Z955 Presence of coronary angioplasty implant and graft: Secondary | ICD-10-CM

## 2016-03-01 DIAGNOSIS — B9620 Unspecified Escherichia coli [E. coli] as the cause of diseases classified elsewhere: Secondary | ICD-10-CM | POA: Diagnosis present

## 2016-03-01 DIAGNOSIS — R079 Chest pain, unspecified: Secondary | ICD-10-CM

## 2016-03-01 DIAGNOSIS — G47419 Narcolepsy without cataplexy: Secondary | ICD-10-CM | POA: Diagnosis present

## 2016-03-01 DIAGNOSIS — R319 Hematuria, unspecified: Secondary | ICD-10-CM | POA: Diagnosis present

## 2016-03-01 DIAGNOSIS — I2510 Atherosclerotic heart disease of native coronary artery without angina pectoris: Secondary | ICD-10-CM | POA: Diagnosis present

## 2016-03-01 DIAGNOSIS — F410 Panic disorder [episodic paroxysmal anxiety] without agoraphobia: Secondary | ICD-10-CM | POA: Diagnosis present

## 2016-03-01 DIAGNOSIS — R778 Other specified abnormalities of plasma proteins: Secondary | ICD-10-CM

## 2016-03-01 DIAGNOSIS — F329 Major depressive disorder, single episode, unspecified: Secondary | ICD-10-CM | POA: Diagnosis present

## 2016-03-01 DIAGNOSIS — J42 Unspecified chronic bronchitis: Secondary | ICD-10-CM | POA: Diagnosis not present

## 2016-03-01 HISTORY — DX: Dyspnea, unspecified: R06.00

## 2016-03-01 LAB — RETICULOCYTES
RBC.: 2.64 MIL/uL — ABNORMAL LOW (ref 4.22–5.81)
Retic Count, Absolute: 42.2 10*3/uL (ref 19.0–186.0)
Retic Ct Pct: 1.6 % (ref 0.4–3.1)

## 2016-03-01 LAB — URINALYSIS, ROUTINE W REFLEX MICROSCOPIC
Bilirubin Urine: NEGATIVE
Glucose, UA: NEGATIVE mg/dL
Hgb urine dipstick: NEGATIVE
Ketones, ur: NEGATIVE mg/dL
NITRITE: POSITIVE — AB
PROTEIN: NEGATIVE mg/dL
SPECIFIC GRAVITY, URINE: 1.01 (ref 1.005–1.030)
pH: 7 (ref 5.0–8.0)

## 2016-03-01 LAB — CBC
HCT: 27.4 % — ABNORMAL LOW (ref 39.0–52.0)
HEMOGLOBIN: 8.3 g/dL — AB (ref 13.0–17.0)
MCH: 27.8 pg (ref 26.0–34.0)
MCHC: 30.3 g/dL (ref 30.0–36.0)
MCV: 91.6 fL (ref 78.0–100.0)
PLATELETS: 341 10*3/uL (ref 150–400)
RBC: 2.99 MIL/uL — AB (ref 4.22–5.81)
RDW: 14.9 % (ref 11.5–15.5)
WBC: 11.3 10*3/uL — AB (ref 4.0–10.5)

## 2016-03-01 LAB — LIPASE, BLOOD: Lipase: 16 U/L (ref 11–51)

## 2016-03-01 LAB — COMPREHENSIVE METABOLIC PANEL
ALBUMIN: 3.3 g/dL — AB (ref 3.5–5.0)
ALT: 11 U/L — ABNORMAL LOW (ref 17–63)
ANION GAP: 6 (ref 5–15)
AST: 19 U/L (ref 15–41)
Alkaline Phosphatase: 89 U/L (ref 38–126)
BILIRUBIN TOTAL: 0.3 mg/dL (ref 0.3–1.2)
BUN: 13 mg/dL (ref 6–20)
CALCIUM: 8.5 mg/dL — AB (ref 8.9–10.3)
CO2: 29 mmol/L (ref 22–32)
CREATININE: 1.28 mg/dL — AB (ref 0.61–1.24)
Chloride: 104 mmol/L (ref 101–111)
GFR, EST NON AFRICAN AMERICAN: 58 mL/min — AB (ref >60)
Glucose, Bld: 117 mg/dL — ABNORMAL HIGH (ref 65–99)
Potassium: 3.7 mmol/L (ref 3.5–5.1)
Sodium: 139 mmol/L (ref 135–145)
Total Protein: 6.8 g/dL (ref 6.5–8.1)

## 2016-03-01 LAB — HEMOGLOBIN AND HEMATOCRIT, BLOOD
HEMATOCRIT: 27.6 % — AB (ref 39.0–52.0)
HEMOGLOBIN: 8.5 g/dL — AB (ref 13.0–17.0)

## 2016-03-01 LAB — D-DIMER, QUANTITATIVE (NOT AT ARMC): D DIMER QUANT: 0.66 ug{FEU}/mL — AB (ref 0.00–0.50)

## 2016-03-01 LAB — IRON AND TIBC
IRON: 12 ug/dL — AB (ref 45–182)
Saturation Ratios: 4 % — ABNORMAL LOW (ref 17.9–39.5)
TIBC: 284 ug/dL (ref 250–450)
UIBC: 272 ug/dL

## 2016-03-01 LAB — BRAIN NATRIURETIC PEPTIDE: B NATRIURETIC PEPTIDE 5: 828 pg/mL — AB (ref 0.0–100.0)

## 2016-03-01 LAB — URINE MICROSCOPIC-ADD ON
RBC / HPF: NONE SEEN RBC/hpf (ref 0–5)
SQUAMOUS EPITHELIAL / LPF: NONE SEEN

## 2016-03-01 LAB — ABO/RH: ABO/RH(D): O POS

## 2016-03-01 LAB — LACTATE DEHYDROGENASE: LDH: 115 U/L (ref 98–192)

## 2016-03-01 LAB — FOLATE: Folate: 26.3 ng/mL (ref >5.9)

## 2016-03-01 LAB — PREPARE RBC (CROSSMATCH)

## 2016-03-01 LAB — TROPONIN I
TROPONIN I: 0.05 ng/mL — AB (ref <0.03)
Troponin I: 0.05 ng/mL (ref <0.03)
Troponin I: 0.06 ng/mL (ref <0.03)
Troponin I: 0.05 ng/mL (ref <0.03)

## 2016-03-01 LAB — VITAMIN B12: Vitamin B-12: 1429 pg/mL — ABNORMAL HIGH (ref 180–914)

## 2016-03-01 LAB — FERRITIN: FERRITIN: 7 ng/mL — AB (ref 24–336)

## 2016-03-01 LAB — POC OCCULT BLOOD, ED: FECAL OCCULT BLD: NEGATIVE

## 2016-03-01 MED ORDER — MORPHINE SULFATE (PF) 4 MG/ML IV SOLN
4.0000 mg | Freq: Once | INTRAVENOUS | Status: AC
  Administered 2016-03-01: 4 mg via INTRAVENOUS
  Filled 2016-03-01: qty 1

## 2016-03-01 MED ORDER — ONDANSETRON HCL 4 MG/2ML IJ SOLN
4.0000 mg | Freq: Once | INTRAMUSCULAR | Status: AC
Start: 2016-03-01 — End: 2016-03-01
  Administered 2016-03-01: 4 mg via INTRAVENOUS
  Filled 2016-03-01: qty 2

## 2016-03-01 MED ORDER — ONDANSETRON HCL 4 MG/2ML IJ SOLN: 4.0000 mg | Freq: Four times a day (QID) | INTRAMUSCULAR | Status: DC | PRN

## 2016-03-01 MED ORDER — B COMPLEX-C PO TABS
1.0000 | ORAL_TABLET | Freq: Every day | ORAL | Status: DC
  Administered 2016-03-01 – 2016-03-05 (×5): 1 via ORAL
  Filled 2016-03-01 (×5): qty 1

## 2016-03-01 MED ORDER — PANTOPRAZOLE SODIUM 40 MG IV SOLR
80.0000 mg | Freq: Once | INTRAVENOUS | Status: AC
  Administered 2016-03-01: 80 mg via INTRAVENOUS
  Filled 2016-03-01: qty 80

## 2016-03-01 MED ORDER — CARVEDILOL 6.25 MG PO TABS
6.2500 mg | ORAL_TABLET | Freq: Two times a day (BID) | ORAL | Status: DC
  Administered 2016-03-01 – 2016-03-05 (×8): 6.25 mg via ORAL
  Filled 2016-03-01 (×8): qty 1

## 2016-03-01 MED ORDER — DULOXETINE HCL 30 MG PO CPEP: 60.0000 mg | ORAL_CAPSULE | Freq: Every day | ORAL | Status: DC

## 2016-03-01 MED ORDER — ATORVASTATIN CALCIUM 10 MG PO TABS
10.0000 mg | ORAL_TABLET | Freq: Every day | ORAL | Status: DC
  Administered 2016-03-01 – 2016-03-05 (×5): 10 mg via ORAL
  Filled 2016-03-01 (×5): qty 1

## 2016-03-01 MED ORDER — TRAZODONE HCL 50 MG PO TABS
50.0000 mg | ORAL_TABLET | Freq: Every evening | ORAL | Status: DC | PRN
  Administered 2016-03-01 – 2016-03-04 (×4): 50 mg via ORAL
  Filled 2016-03-01 (×4): qty 1

## 2016-03-01 MED ORDER — MOMETASONE FURO-FORMOTEROL FUM 200-5 MCG/ACT IN AERO
2.0000 | INHALATION_SPRAY | Freq: Two times a day (BID) | RESPIRATORY_TRACT | Status: DC
  Administered 2016-03-02 – 2016-03-05 (×5): 2 via RESPIRATORY_TRACT
  Filled 2016-03-01 (×2): qty 8.8

## 2016-03-01 MED ORDER — SODIUM CHLORIDE 0.9% FLUSH
3.0000 mL | Freq: Two times a day (BID) | INTRAVENOUS | Status: DC
  Administered 2016-03-01 – 2016-03-05 (×4): 3 mL via INTRAVENOUS

## 2016-03-01 MED ORDER — MORPHINE SULFATE (PF) 4 MG/ML IV SOLN
4.0000 mg | Freq: Once | INTRAVENOUS | Status: AC
  Administered 2016-03-01: 4 mg via INTRAVENOUS

## 2016-03-01 MED ORDER — ALBUTEROL SULFATE (2.5 MG/3ML) 0.083% IN NEBU: 2.5000 mg | INHALATION_SOLUTION | Freq: Four times a day (QID) | RESPIRATORY_TRACT | Status: DC | PRN

## 2016-03-01 MED ORDER — CEFTRIAXONE SODIUM 1 G IJ SOLR
1.0000 g | Freq: Once | INTRAMUSCULAR | Status: AC
  Administered 2016-03-01: 1 g via INTRAVENOUS
  Filled 2016-03-01: qty 10

## 2016-03-01 MED ORDER — PANTOPRAZOLE SODIUM 40 MG IV SOLR: 40.0000 mg | Freq: Two times a day (BID) | INTRAVENOUS | Status: DC

## 2016-03-01 MED ORDER — KETOROLAC TROMETHAMINE 30 MG/ML IJ SOLN
30.0000 mg | Freq: Once | INTRAMUSCULAR | Status: AC
  Administered 2016-03-01: 30 mg via INTRAVENOUS
  Filled 2016-03-01: qty 1

## 2016-03-01 MED ORDER — NITROGLYCERIN 2 % TD OINT
TOPICAL_OINTMENT | TRANSDERMAL | Status: AC
  Administered 2016-03-01: 0.5 [in_us] via TOPICAL
  Filled 2016-03-01: qty 1

## 2016-03-01 MED ORDER — HYDROCODONE-ACETAMINOPHEN 5-325 MG PO TABS
1.0000 | ORAL_TABLET | Freq: Four times a day (QID) | ORAL | Status: DC | PRN
  Administered 2016-03-05: 1 via ORAL
  Filled 2016-03-01 (×2): qty 1

## 2016-03-01 MED ORDER — SODIUM CHLORIDE 0.9 % IV SOLN
8.0000 mg/h | INTRAVENOUS | Status: DC
  Administered 2016-03-01: 8 mg/h via INTRAVENOUS
  Filled 2016-03-01 (×3): qty 80

## 2016-03-01 MED ORDER — NITROGLYCERIN 0.4 MG SL SUBL
0.4000 mg | SUBLINGUAL_TABLET | SUBLINGUAL | Status: DC | PRN
  Filled 2016-03-01: qty 1

## 2016-03-01 MED ORDER — SODIUM CHLORIDE 0.9 % IV BOLUS (SEPSIS)
500.0000 mL | Freq: Once | INTRAVENOUS | Status: AC
  Administered 2016-03-01: 500 mL via INTRAVENOUS

## 2016-03-01 MED ORDER — DIPHENHYDRAMINE HCL 50 MG/ML IJ SOLN: 25.0000 mg | Freq: Four times a day (QID) | INTRAMUSCULAR | Status: DC | PRN

## 2016-03-01 MED ORDER — B COMPLEX PO TABS: 1.0000 | ORAL_TABLET | Freq: Every day | ORAL | Status: DC

## 2016-03-01 MED ORDER — SODIUM CHLORIDE 0.9 % IV SOLN
Freq: Once | INTRAVENOUS | Status: AC
  Administered 2016-03-01: 16:00:00 via INTRAVENOUS

## 2016-03-01 MED ORDER — ONDANSETRON HCL 4 MG PO TABS: 4.0000 mg | ORAL_TABLET | Freq: Four times a day (QID) | ORAL | Status: DC | PRN

## 2016-03-01 MED ORDER — VITAMIN B-1 100 MG PO TABS
100.0000 mg | ORAL_TABLET | Freq: Every day | ORAL | Status: DC
  Administered 2016-03-01 – 2016-03-05 (×5): 100 mg via ORAL
  Filled 2016-03-01 (×5): qty 1

## 2016-03-01 MED ORDER — MOMETASONE FURO-FORMOTEROL FUM 200-5 MCG/ACT IN AERO: 2.0000 | INHALATION_SPRAY | Freq: Two times a day (BID) | RESPIRATORY_TRACT | Status: DC

## 2016-03-01 MED ORDER — FUROSEMIDE 10 MG/ML IJ SOLN: 20.0000 mg | Freq: Once | INTRAMUSCULAR | Status: DC

## 2016-03-01 MED ORDER — DULOXETINE HCL 60 MG PO CPEP
60.0000 mg | ORAL_CAPSULE | Freq: Two times a day (BID) | ORAL | Status: DC
  Administered 2016-03-01 – 2016-03-05 (×8): 60 mg via ORAL
  Filled 2016-03-01 (×8): qty 1

## 2016-03-01 MED ORDER — PANTOPRAZOLE SODIUM 40 MG PO TBEC: 40.0000 mg | DELAYED_RELEASE_TABLET | Freq: Two times a day (BID) | ORAL | Status: DC

## 2016-03-01 MED ORDER — LEVALBUTEROL TARTRATE 45 MCG/ACT IN AERO: 2.0000 | INHALATION_SPRAY | RESPIRATORY_TRACT | Status: DC | PRN

## 2016-03-01 MED ORDER — ASPIRIN 81 MG PO CHEW: 81.0000 mg | CHEWABLE_TABLET | Freq: Every day | ORAL | Status: DC

## 2016-03-01 MED ORDER — ALBUTEROL SULFATE (2.5 MG/3ML) 0.083% IN NEBU: 2.5000 mg | INHALATION_SOLUTION | RESPIRATORY_TRACT | Status: DC | PRN

## 2016-03-01 MED ORDER — ADULT MULTIVITAMIN W/MINERALS CH
1.0000 | ORAL_TABLET | Freq: Every day | ORAL | Status: DC
  Administered 2016-03-01 – 2016-03-05 (×5): 1 via ORAL
  Filled 2016-03-01 (×5): qty 1

## 2016-03-01 MED ORDER — NITROGLYCERIN 2 % TD OINT
0.5000 [in_us] | TOPICAL_OINTMENT | Freq: Four times a day (QID) | TRANSDERMAL | Status: DC
  Administered 2016-03-01 – 2016-03-05 (×10): 0.5 [in_us] via TOPICAL
  Filled 2016-03-01 (×29): qty 30

## 2016-03-01 NOTE — Consult Note (Signed)
[facility] Consult: 8:16 AM 03/02/2016  LOS: 1 day    Referring Provider: Dr Candiss Norse  Primary Care Physician:  Monico Blitz, MD Primary Gastroenterologist:  Dr. Carlean Purl  Cardiologist:  Dr Einar Gip.     Reason for Consultation:  Anemia.    HPI: Dale Shields is a 65 y.o. male.  PMH.   Anxiety, depression.  Narcolepsy.  Multiple sclerosis with optic neuritis .  Fibromyalgia.  Chronic pain syndrome, previously on Suboxone.  PE ("suspected, never confirmed").  COPD.  CAD, MI, s/p angioplasty, drug eluting stent 11/2011.  Neurogenic bladder, chronic indwelling foley.  GERD and HH.   Necrotizing fascitis and extensive skin graft right UE to shoulder.  Hepatitis B surface Ab +,  Hep B core IgM positive 04/2009; on recheck in 5/20111:  Hep B surface and core IgM negative.   03/2011 Colonoscopy.  For pt report of previous colon polyp and CT showing abnml rectum.   Diminutive adenoma.  Surveillance study due 03/2016.    Admit to [facility] 10/2- 01/12/16 with non-cardiac chest pain.  No PPI at discharge.   In ED on 01/26/16 with 2 days n/v, gastro/esophageal burning "like battery acid", generalized abdominal pain.  Sxs worse post prandial.  Seem to have started after starting Rx for Neurontin. No altered bowels or GI bleeding.  Exam with mild diffuse abdominal tenderness.  Marland Kitchen  LFTs normal.  01/26/16 CT ? GB sludge vs stones.  Fatty pancreas. Small HH.  Distal antral and pyloric wall thickening, ?gastritis, ulcer, mass.  Suspect mild duodenitis.  01/26/16 ultrasound: small GB sludge.  No cholecystitis or stones.  5 mm CBD.   Improved after Zofran, Morphine.  Discharged from ED on PPI, Carafate, advised to follow up with PMD and GI.  Last week [facility] advised pt to follow up with [facility], but pt has not yet done this.   Says  he took the PPI Rx for ~ 2 weeks until it ran out, it did not seem to help much.  Says he did not take any liquid GI med, so apparently did not receive or did not fill Carafate Rx as mentioned in ED note.  For some reason, ~ 1 week ago he decided to stop Suboxone, Abilify and Hydroxizine and instead took up to 6 aleve PM per day for 2 days, then stopped.  Anxiety increased and pain persists and is worse over last several days.  Hurt to hold breath and spread arms out for xray yesterday.  No diarrhea or BPR, fewer stools though due to decreased po.   Present to [facility] today with ongoing abdominal pain. Primarily in region below diaphragm. inconsistently worse with meals though still gets hungry.  Diminished po and ~ 10 # weight loss overall.  No n/v.  No dysphagia.   Also c/o progressive DOE, chest pain, fatigue.  Little relief of CP with multiple SL NTG.   Hgb 8.3, was 13 on 10/17.  MCV in 90s.  FOBT negative. Ferritin 7, Iron 12.   Troponins 0.05 >> 0.06 >> 0.05, lateral lead EKG changes.  BNP 828 1 PRBCs transfused and transfer to [facility] hospital for cardiac issues.  LFTs, lipase remain normal.  Mild elevation of creatinine.   Takes 81 ASA daily.  Quit all ETOH and recreational drugs in 1988.   Fm history with cardiac disease.   Pt ate eggs and bagel this AM.    Past Medical History:  Diagnosis Date  . Anemia of chronic disease   . Anxiety   . Anxiety and depression   . BPH (benign prostatic hypertrophy)   . CAD (coronary artery disease) 11/2011   Stents x 3  . Carpal tunnel syndrome   . COPD (chronic obstructive pulmonary disease) ([place])   . DDD (degenerative disc disease)    knees, hips, shoulders--Dr. Ronnie Derby [facility] prior to this)  . Depression   . Endocarditis 2012  . Exertional dyspnea   . Fibromyalgia   . GERD (gastroesophageal reflux disease)   . H/O hiatal hernia   . Heart murmur   . History of blood transfusion    "gave me too much coumadin and heparin once"  . History of epistaxis    . History of gout   . History of MRSA infection 2012   UTI, bacteremia, and endocarditis  . History of renal failure 2011   "on dialysis 3 times over 2 years"  . HLD (hyperlipidemia)   . Hydronephrosis   . Hypertension   . Hypothyroidism   . Insomnia   . Multiple sclerosis ([place])    with optic neuritis  . Myocardial infarction    "according to stress test; that's news to me"  . Narcolepsy   . Necrotizing fasciitis ([place])   . Neurogenic bladder    chronic indwelling foley catheter-changed monthly  . Optic neuritis   . OSA (obstructive sleep apnea)    "tried CPAP; lost more sleep w/use"  . Osteoarthritis of multiple joints    "everywhere"  . Pulmonary embolism ([place])    "suspected; never confirmed"--? treated with coumadin  . Scoliosis   . Steroid-induced psychosis   . Supraspinatus tendon tear 2012  . Thrombocytopenia ([place])     Past Surgical History:  Procedure Laterality Date  . CARPAL TUNNEL RELEASE  ~ 2010   left with radius fracture repair  . COLONOSCOPY  2000; 2005; 03/15/2011   diminutve adenoma--recall 2017  . CORONARY ANGIOPLASTY  11/22/2011   3 vessels  . CORONARY ANGIOPLASTY WITH STENT PLACEMENT  11/22/2011   DES to LAD  . KNEE ARTHROSCOPY W/ MENISCAL REPAIR  1990's   right  . LEFT HEART CATHETERIZATION WITH CORONARY ANGIOGRAM N/A 11/22/2011   Procedure: LEFT HEART CATHETERIZATION WITH CORONARY ANGIOGRAM;  Surgeon: Laverda Page, MD;  Location: [facility] CATH LAB;  Service: Cardiovascular;  Laterality: N/A;  . nasal cautery    . PERCUTANEOUS CORONARY STENT INTERVENTION (PCI-S)  11/22/2011   Procedure: PERCUTANEOUS CORONARY STENT INTERVENTION (PCI-S);  Surgeon: Laverda Page, MD;  Location[facility] C[facility] LAB;  Service: Cardiovascular;;  . SKIN GRAFT  04/2009   right thigh to right "wrist all the way to my shoulder; necrotizing fasciitis    Prior to Admission medications   Medication Sig Start Date End Date Taking? Authorizing Provider  albuterol (PROVENTIL) (2.5  MG/3ML) 0.083% nebulizer solution Take 2.5 mg by nebulization every 6 (six) hours as needed for wheezing or shortness of breath.   Yes Historical Provider, MD  aspirin 81 MG chewable tablet Chew 81 mg by mouth daily.    Yes Historical Provider, MD  atorvastatin (LIPITOR) 10 MG tablet  Take 10 mg by mouth daily.   Yes Historical Provider, MD  b complex vitamins tablet Take 1 tablet by mouth daily.   Yes Historical Provider, MD  carvedilol (COREG) 6.25 MG tablet Take 6.25 mg by mouth 2 (two) times daily. 02/17/16  Yes Historical Provider, MD  DULoxetine (CYMBALTA) 60 MG capsule TAKE (1) CAPSULE BY MOUTH TWICE DAILY. 02/18/16  Yes Norman Clay, MD  Fluticasone-Salmeterol (ADVAIR) 250-50 MCG/DOSE AEPB Inhale 1 puff into the lungs 2 (two) times daily as needed.    Yes Historical Provider, MD  hydrOXYzine (ATARAX/VISTARIL) 25 MG tablet Take 1 tablet (25 mg total) by mouth 3 (three) times daily as needed. Patient taking differently: Take 25 mg by mouth 3 (three) times daily as needed for anxiety.  02/18/16  Yes Norman Clay, MD  levalbuterol [facility] HFA) 45 MCG/ACT inhaler Inhale 2 puffs into the lungs every 4 (four) hours as needed for wheezing.   Yes Historical Provider, MD  Multiple Vitamins-Minerals (THEREMS-M) TABS Take 1 tablet by mouth daily.   Yes Historical Provider, MD  nitroGLYCERIN (NITROSTAT) 0.3 MG SL tablet Place 0.4 mg under the tongue every 5 (five) minutes as needed for chest pain.   Yes Historical Provider, MD  NUVIGIL 250 MG tablet TAKE 1 TABLET BY MOUTH TWICE DAILY. Patient taking differently: TAKE 1 TABLET DAILY 10/02/15  Yes Wardell Honour, MD  omeprazole (PRILOSEC) 20 MG capsule Take 1 capsule (20 mg total) by mouth daily. 01/26/16  Yes Jola Schmidt, MD  Thiamine HCl (VITAMIN B-1 PO) Take 1 tablet by mouth daily.   Yes Historical Provider, MD  traZODone (DESYREL) 50 MG tablet Take 1 tablet by mouth at bedtime as needed for sleep. 02/15/16  Yes Historical Provider, MD  VITAMIN A PO Take  1 tablet by mouth daily.   Yes Historical Provider, MD  vitamin C (ASCORBIC ACID) 500 MG tablet Take 1,000 mg by mouth 2 (two) times daily.    Yes Historical Provider, MD  VITAMIN E PO Take 1 tablet by mouth daily.   Yes Historical Provider, MD  ARIPiprazole (ABILIFY) 2 MG tablet Take 1 tablet (2 mg total) by mouth daily. Patient not taking: Reported on 03/01/2016 02/18/16 02/17/17  Norman Clay, MD  ondansetron (ZOFRAN ODT) 8 MG disintegrating tablet Take 1 tablet (8 mg total) by mouth every 8 (eight) hours as needed for nausea or vomiting. Patient not taking: Reported on 03/01/2016 01/26/16   Jola Schmidt, MD    Scheduled Meds: . atorvastatin  10 mg Oral Daily  . B-complex with vitamin C  1 tablet Oral Daily  . carvedilol  6.25 mg Oral BID  . DULoxetine  60 mg Oral BID  . furosemide  20 mg Intravenous Once  . mometasone-formoterol  2 puff Inhalation BID  . multivitamin with minerals  1 tablet Oral Daily  . nitroGLYCERIN  0.5 inch Topical Q6H  . [START ON 03/05/2016] pantoprazole  40 mg Intravenous Q12H  . sodium chloride flush  3 mL Intravenous Q12H  . thiamine  100 mg Oral Daily   Infusions: . sodium chloride    . pantoprozole (PROTONIX) infusion 8 mg/hr (03/01/16 2331)   PRN Meds: albuterol, albuterol, diphenhydrAMINE, HYDROcodone-acetaminophen, nitroGLYCERIN, ondansetron **OR** ondansetron (ZOFRAN) IV, traZODone   Allergies as of 03/01/2016 - Review Complete 03/01/2016  Allergen Reaction Noted  . Ultram [tramadol hcl] Other (See Comments) 02/16/2011  . Gabapentin Other (See Comments) 01/11/2016  . Lyrica [pregabalin] Other (See Comments) 03/01/2016    Family History  Problem Relation Age  of Onset  . Alzheimer's disease Mother   . Colon cancer Neg Hx     Social History   Social History  . Marital status: Single    Spouse name: N/A  . Number of children: N/A  . Years of education: N/A   Occupational History  . Not on file.   Social History Main Topics  .  Smoking status: Former Smoker    Packs/day: 0.00    Years: 40.00    Quit date: 08/15/2014  . Smokeless tobacco: Never Used     Comment: Counseling sheet to quit smoking given in exam room   . Alcohol use No     Comment: 11/22/2011 "like crazy til 1988; nothing since"  . Drug use: No     Comment: *8//13/13 "1986 til 1988 like crazy; nothing since"  . Sexual activity: Not Currently   Other Topics Concern  . Not on file   Social History Narrative   Never married, no children.   Orig from [place], [place], lived a long time in [place].   Has been in [place] since about 2004.   College at [employer] "for years".   Denies alcohol or drug use.   Cigarettes: 50 pack-yr hx--current as of 03/2013.    REVIEW OF SYSTEMS: Constitutional:  Weakness, SOB: worse in last few weeks ENT:  No nose bleeds Pulm:  SOB increased.  No cough CV:  No palpitations, no LE edema.  GU:  No hematuria, no frequency GI:  Per HPI Heme:  No unusual  Bleeding or bruising   Transfusions:  None in Epic, apparently was remotely transfused.  Neuro:  No headaches, no peripheral tingling or numbness Psych:  Occasional panic attacks.  Derm:  His"phantom pain" in the right shoulder causes pruritus and he scratches so much that he has caused skin breakdown and resulting exudate at top of right shoulder.  Endocrine:  No sweats or chills.  No polyuria or dysuria Immunization:  Flu shot 01/2016. Travel:  None beyond local counties in last few months.    PHYSICAL EXAM: Vital signs in last 24 hours: Vitals:   03/01/16 2130 03/02/16 0621  BP: 136/89 136/79  Pulse: 71 93  Resp: 16 18  Temp: 98.2 F (36.8 C) 98.7 F (37.1 C)   Wt Readings from Last 3 Encounters:  03/02/16 85.7 kg (189 lb)  01/26/16 90.7 kg (200 lb)  01/11/16 90.7 kg (200 lb)    General: pale kind of ghostly, anxious comfortable WM.   Head:  No asymmetry or trauma.  No swelling  Eyes:  No icterus or pallor Ears:  Not HOH  Nose:  No discharge or  congestion Mouth:  Upper full dentures.  Lower jaw with only front 8 or so teeth intact, all lower molars gone.  MM moist and clear.  Midline tongue. Neck:  No mass or JVD.  No bruit Lungs:  A few dry rales in right base.  No cough or dyspnea.  Good BS Heart: RRR.  No mrg.  S1, s2 present Abdomen:  Soft, ND, active BS.  No bruits, masses, HSM.  Tender, moderate, across upper abdomen.  No guard or rebound.   Rectal: deferred   Musc/Skeltl: no joint erythema or swelling, spinal scoliosis Extremities:  No CCE  Neurologic:  Oriented x 3.  Alert.  Restless leg type movements.  No limb weakness or tremor.  No gross deficits Skin:  Skin graft scars on entire right arm up into shoulder.  At right shoulder is brown crusty  material and t shirt in this area shows that the wound oozed.  Nodes:  No cervical or inguinal adenopathy   Psych:  Anxious, cooperative.    Intake/Output from previous day: 11/21 0701 - 11/22 0700 In: 1280 [I.V.:10; Blood:670; IV Piggyback:600] Out: 220 [Urine:220] Intake/Output this shift: No intake/output data recorded.  LAB RESULTS:  Recent Labs  03/01/16 0929 03/01/16 2208 03/02/16 0400  WBC 11.3*  --  9.3  HGB 8.3* 8.5* 8.7*  HCT 27.4* 27.6* 28.4*  PLT 341  --  278   BMET Lab Results  Component Value Date   NA 140 03/02/2016   NA 139 03/01/2016   NA 138 01/26/2016   K 4.5 03/02/2016   K 3.7 03/01/2016   K 4.2 01/26/2016   CL 108 03/02/2016   CL 104 03/01/2016   CL 96 (L) 01/26/2016   CO2 25 03/02/2016   CO2 29 03/01/2016   CO2 32 01/26/2016   GLUCOSE 108 (H) 03/02/2016   GLUCOSE 117 (H) 03/01/2016   GLUCOSE 146 (H) 01/26/2016   BUN 11 03/02/2016   BUN 13 03/01/2016   BUN 30 (H) 01/26/2016   CREATININE 1.42 (H) 03/02/2016   CREATININE 1.28 (H) 03/01/2016   CREATININE 1.38 (H) 01/26/2016   CALCIUM 8.3 (L) 03/02/2016   CALCIUM 8.5 (L) 03/01/2016   CALCIUM 10.2 01/26/2016   LFT  Recent Labs  03/01/16 0929  PROT 6.8  ALBUMIN 3.3*  AST 19    ALT 11*  ALKPHOS 89  BILITOT 0.3   PT/INR Lab Results  Component Value Date   INR 1.01 03/02/2016   INR 1.10 01/13/2011   INR 1.17 09/29/2010   Hepatitis Panel No results for input(s): HEPBSAG, HCVAB, HEPAIGM, HEPBIGM in the last 72 hours. C-Diff No components found for: CDIFF Lipase     Component Value Date/Time   LIPASE 16 03/01/2016 0929      RADIOLOGY STUDIES: Dg Chest 1 View  Result Date: 03/01/2016 CLINICAL DATA:  Nodular density projecting over right lower lung on previous x-ray. Repeat chest x-ray with nipple markers. EXAM: CHEST 1 VIEW COMPARISON:  Chest x-ray earlier today. FINDINGS: Repeat film with bilateral nipple markers no longer demonstrates any concerning nodular densities bilaterally. There is no evidence of pulmonary edema, consolidation, pneumothorax, nodule or pleural fluid. IMPRESSION: No further suspicious nodular densities identified, particularly overlying the right lower lung. Electronically Signed   By: Aletta Edouard M.D.   On: 03/01/2016 13:48   Dg Chest 2 View  Result Date: 03/01/2016 CLINICAL DATA:  Shortness of breath.  COPD. EXAM: CHEST  2 VIEW COMPARISON:  01/11/2016, 04/06/2015, 11/15/2014 FINDINGS: The heart and mediastinal contours are within normal limits for size and stable. Mild atherosclerotic calcifications seen in the thoracic aortic arch. The lungs demonstrate emphysematous changes. There is a vague 7 mm nodular density projecting over the right lung base and an anterior rib on the frontal view, not seen on prior chest radiographs. Otherwise, the lungs are clear. No airspace disease, effusion, or pneumothorax identified. IMPRESSION: COPD. Vague 7 mm nodular density seen at the right lung base on the frontal view of the chest. Consider repeat frontal view of the chest with nipple markers in place. Electronically Signed   By: Curlene Dolphin M.D.   On: 03/01/2016 11:40    IMPRESSION:   *  Normocytic anemia.  By labs, he is iron  deficient.  FOBT negative.  S/p PRBC x 1.      *  Upper, subdiaphragmatic, burning abdominal pain for >  1 month.  CT and ultrasound imaging 4 weeks ago: GB sludge, thickened antrum and pylorus. ? Gastritis/duodenitis, ? Neoplasia.  sxs did not improve with 2 weeks of PPI.  GB intact.       *  Resolved Hep B 2011, confirmed by serial serologies. LFTs normal.   *  Hx adenomatous colon polyps, last surveillance study 03/2011.  Due for rescope 03/2016 but arrangements have not been made (no call back letter sent yet).    *  Progressive DOE, angina, fatigue with mild elevation of Troponins x 2 and EKG changes.  Elevated BNP ?demand ischemia.  Has not been seen yet by Dr Einar Gip, his cardiologist.   *  Elevated D dimer.  VQ scan and LE dopplers ordered.   *  Chronic pain syndrome, fibromyalgia, narcotics requiring, anxiety, depression, multiple sclerosis with hx optic neuritis.   Self managing many of these meds and having increased anxiety, panic attacks earlier this month.    PLAN:     *  Currently set up for EGD and colonoscopy with MAC on 11/24 at 8:30 AM.      *  Await doppler and VQ scan  *  Stopped PPI drip and initiated bid po protonix.  Additional hemoccult orders in place.   CBC in AM.   3:24 PM addendum No DVT on dopplers and low probability VQ scan.   Dale Shields  03/02/2016, 8:16 AM Pager: [phone]

## 2016-03-01 NOTE — ED Notes (Signed)
Report given to Dominica with [facility]

## 2016-03-01 NOTE — ED Notes (Signed)
Holding meds not given at this time. They have not been validated with pharmacy.

## 2016-03-01 NOTE — ED Notes (Signed)
Pt stating he is having increased abdominal pain. Dr. Aggie Moats paged.

## 2016-03-01 NOTE — ED Notes (Signed)
CRITICAL VALUE ALERT  Critical value received:  Troponin 0.05  Date of notification:  03/01/16  Time of notification:  [id number]  Critical value read back:Yes.    Nurse who received alert:  Charmayne Sheer, RN  MD notified (1st page):  Lacinda Axon

## 2016-03-01 NOTE — ED Provider Notes (Addendum)
[facility] DEPT Provider Note   CSN: [id number] Arrival date & time: 03/01/16  [id number]  By signing my name below, I, Rayna Sexton, attest that this documentation has been prepared under the direction and in the presence of Nat Christen, MD. Electronically Signed: Rayna Sexton, ED Scribe. 03/01/16. 9:58 AM.   History   Chief Complaint Chief Complaint  Patient presents with  . Abdominal Pain    HPI HPI Comments: Dale Shields is a 65 y.o. male with a significant medical history listed below who presents to the Emergency Department complaining of diffuse, moderate, upper abdominal pain x 1 month. He states his symptoms began s/p beginning a rx for Neurontin which he notes he is allergic to. He reports associated, worsening, dyspnea with exertion, central CP, and fatigue. Pt states that he was capable of ambulating down a hall 2-3 months ago and now notes his dyspnea worsens with little movement or when sitting upright. He took 6x NTG 2-3 days ago for his CP which he states provided short term relief. His NTG is prescribed by his cardiologist. He confirms his listed PCP. He is not on home O2. Pt lives by himself. Pt denies leg swelling.   The history is provided by the patient. No language interpreter was used.    Past Medical History:  Diagnosis Date  . Anemia of chronic disease   . Anxiety   . Anxiety and depression   . BPH (benign prostatic hypertrophy)   . CAD (coronary artery disease) 11/2011   Stents x 3  . Carpal tunnel syndrome   . COPD (chronic obstructive pulmonary disease) ([place])   . DDD (degenerative disc disease)    knees, hips, shoulders--Dr. Ronnie Derby [facility] prior to this)  . Depression   . Endocarditis 2012  . Exertional dyspnea   . Fibromyalgia   . GERD (gastroesophageal reflux disease)   . H/O hiatal hernia   . Heart murmur   . History of blood transfusion    "gave me too much coumadin and heparin once"  . History of epistaxis   . History of gout   .  History of MRSA infection 2012   UTI, bacteremia, and endocarditis  . History of renal failure 2011   "on dialysis 3 times over 2 years"  . HLD (hyperlipidemia)   . Hydronephrosis   . Hypertension   . Hypothyroidism   . Insomnia   . Multiple sclerosis ([place])    with optic neuritis  . Myocardial infarction    "according to stress test; that's news to me"  . Narcolepsy   . Necrotizing fasciitis ([place])   . Neurogenic bladder    chronic indwelling foley catheter-changed monthly  . Optic neuritis   . OSA (obstructive sleep apnea)    "tried CPAP; lost more sleep w/use"  . Osteoarthritis of multiple joints    "everywhere"  . Pulmonary embolism ([place])    "suspected; never confirmed"--? treated with coumadin  . Scoliosis   . Steroid-induced psychosis   . Supraspinatus tendon tear 2012  . Thrombocytopenia [facility])     Patient Active Problem List   Diagnosis Date Noted  . Major depressive disorder, recurrent episode, moderate ([place]) 01/15/2016  . Depression 01/11/2016  . Hyperlipidemia 01/11/2016  . Anxiety 01/11/2016  . Atypical chest pain 04/08/2015  . UTI (urinary tract infection) 04/08/2015  . Chest pain, rule out acute myocardial infarction 04/06/2015  . Chest pain 04/06/2015  . Hydronephrosis 11/18/2014  . Catheter-associated urinary tract infection ([place])   . Malnutrition of  moderate degree ([place]) 11/17/2014  . AKI (acute kidney injury) ([place]) 11/16/2014  . Hematuria 11/16/2014  . Generalized weakness 11/16/2014  . FTT (failure to thrive) in adult 11/16/2014  . Acute encephalopathy 11/16/2014  . Anemia of chronic disease 11/16/2014  . COPD (chronic obstructive pulmonary disease) ([place]) 11/15/2014  . CAD (coronary artery disease) 11/15/2014  . Chronic pain syndrome 04/11/2013  . Multiple sclerosis ([place]) 04/11/2013    Past Surgical History:  Procedure Laterality Date  . CARPAL TUNNEL RELEASE  ~ 2010   left with radius fracture repair  . COLONOSCOPY  2000; 2005; 03/15/2011    diminutve adenoma--recall 2017  . CORONARY ANGIOPLASTY  11/22/2011   3 vessels  . CORONARY ANGIOPLASTY WITH STENT PLACEMENT  11/22/2011   DES to LAD  . KNEE ARTHROSCOPY W/ MENISCAL REPAIR  1990's   right  . LEFT HEART CATHETERIZATION WITH CORONARY ANGIOGRAM N/A 11/22/2011   Procedure: LEFT HEART CATHETERIZATION WITH CORONARY ANGIOGRAM;  Surgeon: Laverda Page, MD;  Location: [facility] CATH LAB;  Service: Cardiovascular;  Laterality: N/A;  . nasal cautery    . PERCUTANEOUS CORONARY STENT INTERVENTION (PCI-S)  11/22/2011   Procedure: PERCUTANEOUS CORONARY STENT INTERVENTION (PCI-S);  Surgeon: Laverda Page, MD;  Location: [facility] CATH LAB;  Service: Cardiovascular;;  . SKIN GRAFT  04/2009   right thigh to right "wrist all the way to my shoulder; necrotizing fasciitis       Home Medications    Prior to Admission medications   Medication Sig Start Date End Date Taking? Authorizing Provider  albuterol (PROVENTIL) (2.5 MG/3ML) 0.083% nebulizer solution Take 2.5 mg by nebulization every 6 (six) hours as needed for wheezing or shortness of breath.   Yes Historical Provider, MD  aspirin 81 MG chewable tablet Chew 81 mg by mouth daily.    Yes Historical Provider, MD  atorvastatin (LIPITOR) 10 MG tablet Take 10 mg by mouth daily.   Yes Historical Provider, MD  b complex vitamins tablet Take 1 tablet by mouth daily.   Yes Historical Provider, MD  carvedilol (COREG) 6.25 MG tablet Take 6.25 mg by mouth 2 (two) times daily. 02/17/16  Yes Historical Provider, MD  DULoxetine (CYMBALTA) 60 MG capsule TAKE (1) CAPSULE BY MOUTH TWICE DAILY. 02/18/16  Yes Norman Clay, MD  Fluticasone-Salmeterol (ADVAIR) 250-50 MCG/DOSE AEPB Inhale 1 puff into the lungs 2 (two) times daily as needed.    Yes Historical Provider, MD  hydrOXYzine (ATARAX/VISTARIL) 25 MG tablet Take 1 tablet (25 mg total) by mouth 3 (three) times daily as needed. Patient taking differently: Take 25 mg by mouth 3 (three) times daily as needed for  anxiety.  02/18/16  Yes Norman Clay, MD  levalbuterol [facility] HFA) 45 MCG/ACT inhaler Inhale 2 puffs into the lungs every 4 (four) hours as needed for wheezing.   Yes Historical Provider, MD  Multiple Vitamins-Minerals (THEREMS-M) TABS Take 1 tablet by mouth daily.   Yes Historical Provider, MD  nitroGLYCERIN (NITROSTAT) 0.3 MG SL tablet Place 0.4 mg under the tongue every 5 (five) minutes as needed for chest pain.   Yes Historical Provider, MD  NUVIGIL 250 MG tablet TAKE 1 TABLET BY MOUTH TWICE DAILY. Patient taking differently: TAKE 1 TABLET DAILY 10/02/15  Yes Wardell Honour, MD  omeprazole (PRILOSEC) 20 MG capsule Take 1 capsule (20 mg total) by mouth daily. 01/26/16  Yes Jola Schmidt, MD  Thiamine HCl (VITAMIN B-1 PO) Take 1 tablet by mouth daily.   Yes Historical Provider, MD  traZODone (DESYREL) 50 MG  tablet Take 1 tablet by mouth at bedtime as needed for sleep. 02/15/16  Yes Historical Provider, MD  VITAMIN A PO Take 1 tablet by mouth daily.   Yes Historical Provider, MD  vitamin C (ASCORBIC ACID) 500 MG tablet Take 1,000 mg by mouth 2 (two) times daily.    Yes Historical Provider, MD  VITAMIN E PO Take 1 tablet by mouth daily.   Yes Historical Provider, MD  ARIPiprazole (ABILIFY) 2 MG tablet Take 1 tablet (2 mg total) by mouth daily. Patient not taking: Reported on 03/01/2016 02/18/16 02/17/17  Norman Clay, MD  ondansetron (ZOFRAN ODT) 8 MG disintegrating tablet Take 1 tablet (8 mg total) by mouth every 8 (eight) hours as needed for nausea or vomiting. Patient not taking: Reported on 03/01/2016 01/26/16   Jola Schmidt, MD    Family History Family History  Problem Relation Age of Onset  . Alzheimer's disease Mother   . Colon cancer Neg Hx     Social History Social History  Substance Use Topics  . Smoking status: Former Smoker    Packs/day: 0.00    Years: 40.00    Quit date: 08/15/2014  . Smokeless tobacco: Never Used     Comment: Counseling sheet to quit smoking given in exam  room   . Alcohol use No     Comment: 11/22/2011 "like crazy til 1988; nothing since"     Allergies   Ultram [tramadol hcl]; Gabapentin; and Lyrica [pregabalin]   Review of Systems Review of Systems  Respiratory: Positive for shortness of breath.   Cardiovascular: Positive for chest pain.  Gastrointestinal: Positive for abdominal pain.  Psychiatric/Behavioral: Decreased concentration:    All other systems reviewed and are negative.  Physical Exam Updated Vital Signs BP 118/75   Pulse 78   Temp 98.7 F (37.1 C) (Oral)   Resp 18   Ht 5\' 11"  (1.803 m)   Wt 195 lb (88.5 kg)   SpO2 92%   BMI 27.20 kg/m   Physical Exam  Constitutional: He is oriented to person, place, and time. He appears well-developed and well-nourished.  Not obviously dyspneic. Lying in bed with nml breathing pattern.  HENT:  Head: Normocephalic and atraumatic.  Eyes: Conjunctivae are normal.  Neck: Neck supple.  Cardiovascular: Normal rate and regular rhythm.   Pulmonary/Chest: Effort normal and breath sounds normal.  Abdominal: Soft. Bowel sounds are normal. There is tenderness in the epigastric area.  Mild tenderness across epigastrium   Musculoskeletal: Normal range of motion.  Neurological: He is alert and oriented to person, place, and time.  Skin: Skin is warm and dry.  Psychiatric: He has a normal mood and affect. His behavior is normal.  Nursing note and vitals reviewed.  ED Treatments / Results  Labs (all labs ordered are listed, but only abnormal results are displayed) Labs Reviewed  COMPREHENSIVE METABOLIC PANEL - Abnormal; Notable for the following:       Result Value   Glucose, Bld 117 (*)    Creatinine, Ser 1.28 (*)    Calcium 8.5 (*)    Albumin 3.3 (*)    ALT 11 (*)    GFR calc non Af Amer 58 (*)    All other components within normal limits  CBC - Abnormal; Notable for the following:    WBC 11.3 (*)    RBC 2.99 (*)    Hemoglobin 8.3 (*)    HCT 27.4 (*)    All other  components within normal limits  URINALYSIS, ROUTINE W  REFLEX MICROSCOPIC (NOT AT [facility]) - Abnormal; Notable for the following:    Nitrite POSITIVE (*)    Leukocytes, UA SMALL (*)    All other components within normal limits  TROPONIN I - Abnormal; Notable for the following:    Troponin I 0.05 (*)    All other components within normal limits  D-DIMER, QUANTITATIVE (NOT AT [facility]) - Abnormal; Notable for the following:    D-Dimer, Quant 0.66 (*)    All other components within normal limits  URINE MICROSCOPIC-ADD ON - Abnormal; Notable for the following:    Bacteria, UA MANY (*)    All other components within normal limits  LIPASE, BLOOD  TROPONIN I  POC OCCULT BLOOD, ED    EKG  EKG Interpretation None      Date: 03/01/2016  Rate: 72  Rhythm: normal sinus rhythm  QRS Axis: normal  Intervals: normal  ST/T Wave abnormalities: TWI  V234  Conduction Disutrbances: none  Narrative Interpretation: unremarkable     Radiology Dg Chest 2 View  Result Date: 03/01/2016 CLINICAL DATA:  Shortness of breath.  COPD. EXAM: CHEST  2 VIEW COMPARISON:  01/11/2016, 04/06/2015, 11/15/2014 FINDINGS: The heart and mediastinal contours are within normal limits for size and stable. Mild atherosclerotic calcifications seen in the thoracic aortic arch. The lungs demonstrate emphysematous changes. There is a vague 7 mm nodular density projecting over the right lung base and an anterior rib on the frontal view, not seen on prior chest radiographs. Otherwise, the lungs are clear. No airspace disease, effusion, or pneumothorax identified. IMPRESSION: COPD. Vague 7 mm nodular density seen at the right lung base on the frontal view of the chest. Consider repeat frontal view of the chest with nipple markers in place. Electronically Signed   By: Curlene Dolphin M.D.   On: 03/01/2016 11:40    Procedures Procedures  DIAGNOSTIC STUDIES: Oxygen Saturation is 99% on RA, normal by my interpretation.    COORDINATION  OF CARE: 9:58 AM Discussed next steps with pt. Pt verbalized understanding and is agreeable with the plan.    Medications Ordered in ED Medications  sodium chloride 0.9 % bolus 500 mL (500 mLs Intravenous New Bag/Given 03/01/16 1026)  ondansetron (ZOFRAN) injection 4 mg (4 mg Intravenous Given 03/01/16 1026)  morphine 4 MG/ML injection 4 mg (4 mg Intravenous Given 03/01/16 1026)  morphine 4 MG/ML injection 4 mg (4 mg Intravenous Given 03/01/16 1147)     Initial Impression / Assessment and Plan / ED Course  I have reviewed the triage vital signs and the nursing notes.  Pertinent labs & imaging results that were available during my care of the patient were reviewed by me and considered in my medical decision making (see chart for details).    CRITICAL CARE Performed by: Nat Christen  ?  Total critical care time: 30 minutes  Critical care time was exclusive of separately billable procedures and treating other patients.  Critical care was necessary to treat or prevent imminent or life-threatening deterioration.  Critical care was time spent personally by me on the following activities: development of treatment plan with patient and/or surrogate as well as nursing, discussions with consultants, evaluation of patient's response to treatment, examination of patient, obtaining history from patient or surrogate, ordering and performing treatments and interventions, ordering and review of laboratory studies, ordering and review of radiographic studies, pulse oximetry and re-evaluation of patient's condition. Clinical Course   Patient complains of upper abdominal pain and dyspnea. He is normally dyspneic, but this  has worsened over the last several weeks. Hgb has dropped from 13.0 on 01/26/16 to 8.3 today.  Rectal exam negative for heme. Additionally he has a urinary tract infection and a minimally elevated troponin.   Plan minimally elevated at 0.05. EKG shows T-wave inversion in V2 V3 V4.  We'll discuss with cardiology.Transfer to [employer].  Will workup anemia prior to any aggressive cardiac intervention   I personally performed the services described in this documentation, which was scribed in my presence. The recorded information has been reviewed and is accurate.   Final Clinical Impressions(s) / ED Diagnoses   Final diagnoses:  Dyspnea, unspecified type  Anemia, unspecified type  Elevated troponin    New Prescriptions New Prescriptions   No medications on file     Nat Christen, MD 03/01/16 [place], MD 03/01/16 1401

## 2016-03-01 NOTE — ED Notes (Signed)
CRITICAL VALUE ALERT  Critical value received:  Trop 0.05  Date of notification:  03/01/2016  Time of notification:  20:00  Critical value read back: yes  Nurse who received alert:  Rip Harbour RN   MD notified (1st page):  Dr Oleta Mouse  Time of first page:  20:05  MD notified (2nd page):  Time of second page:  Responding MD:  Dr Oleta Mouse  Time MD responded:  20:05

## 2016-03-01 NOTE — ED Notes (Signed)
Blood validated with Lana Fish, RN

## 2016-03-01 NOTE — ED Triage Notes (Signed)
Pt comes in with upper abdominal pain starting 2-3 weeks ago. Pt states he has n/v/d last week but hasn't had any of these symptoms since then. Pt has a foley in place and it is draining appropriately. NAD noted.

## 2016-03-01 NOTE — ED Notes (Signed)
Attempted to call receiving RN at [facility], RN unable to come to phone, advised that they would return call,

## 2016-03-01 NOTE — Progress Notes (Signed)
Patient received from [facility] via [facility]. Blood transfusion completed on arrival. Patient oriented to room, tele monitoring, physician paged and skin assessment completed. Call light within reach

## 2016-03-01 NOTE — ED Notes (Signed)
Pt has completed 15 minutes of blood administration. No reaction noted at this time. VS stable.

## 2016-03-01 NOTE — ED Notes (Signed)
Pt in xray

## 2016-03-01 NOTE — ED Notes (Signed)
Provider at bedside

## 2016-03-01 NOTE — H&P (Signed)
TRH H&P   Patient Demographics:    Dale Shields, is a 65 y.o. male  MRN: [id number]   DOB - 1950-04-27  Admit Date - 03/01/2016  Outpatient Primary MD for the patient is Monico Blitz, MD  Outpatient Specialists: Cardiologist Dr. Einar Gip  Patient coming from: Home   Chief Complaint  Patient presents with  . Abdominal Pain      HPI:    Dale Shields  is a 65 y.o. male, with a history of COPD, CAD s/p 3 stent placements, GERD, HTN, hypothyroidism, CHF, MS and neurogenic bladder with chronic indwelling foley, presents with complaints of exertional shortness of breath, epigastric pain, and exertional Left-sided dull chest pain in his left shoulder. He has associated generalized  weakness. While in the ED, EKG revealed flipped lateral leads, mildly +ve Trop. Hemoccult was negative.   Asian currently is relatively symptom-free he denies any headache, no fever chills, currently chest pain-free, does have a mild epigastric ache which is constant associated with mild nausea, denies any emesis, denies noticing any blood in stool, no black colored stool. He is getting 1 unit of packed RBC transfused in the ER, IV PPI, I have already consulted Dr. Einar Gip his cardiologist and Velora Heckler GI PA sera Trixie Deis, patient will be transferred to [facility] for further care.   Review of systems:    In addition to the HPI above,  No Fever-chills, No Headache, No changes with Vision or hearing, No problems swallowing food or Liquids, +Ve Chest pain and shortness of breath, no cough +Ve epigastric pain, No Nausea or Vommitting, Bowel movements are regular, No Blood in stool or Urine, No dysuria, No new skin rashes or bruises, No new  joints pains-aches,  +Ve Generalized weakness, no new tingling or numbness in any extremity, No recent weight gain or loss, No polyuria, polydypsia or polyphagia, No significant Mental Stressors.  A full 10 point Review of Systems was done, except as stated above, all other Review of Systems were negative.   With Past History of the following :    Past Medical History:  Diagnosis Date  . Anemia of chronic disease   . Anxiety   . Anxiety and depression   . BPH (benign prostatic hypertrophy)   . CAD (coronary artery disease) 11/2011   Stents x 3  . Carpal tunnel syndrome   .  COPD (chronic obstructive pulmonary disease) ([place])   . DDD (degenerative disc disease)    knees, hips, shoulders--[facility]. [facility] prior to this)  . Depression   . Endocarditis 2012  . Exertional dyspnea   . Fibromyalgia   . GERD (gastroesophageal reflux disease)   . H/O hiatal hernia   . Heart murmur   . History of blood transfusion    "gave me too much coumadin and heparin once"  . History of epistaxis   . History of gout   . History of MRSA infection 2012   UTI, bacteremia, and endocarditis  . History of renal failure 2011   "on dialysis 3 times over 2 years"  . HLD (hyperlipidemia)   . Hydronephrosis   . Hypertension   . Hypothyroidism   . Insomnia   . Multiple sclerosis ([place])    with optic neuritis  . Myocardial infarction    "according to stress test; that's news to me"  . Narcolepsy   . Necrotizing fasciitis ([place])   . Neurogenic bladder    chronic indwelling foley catheter-changed monthly  . Optic neuritis   . OSA (obstructive sleep apnea)    "tried CPAP; lost more sleep w/use"  . Osteoarthritis of multiple joints    "everywhere"  . Pulmonary embolism ([place])    "suspected; never confirmed"--? treated with coumadin  . Scoliosis   . Steroid-induced psychosis   . Supraspinatus tendon tear 2012  . Thrombocytopenia ([place])       Past Surgical History:  Procedure Laterality Date  .  CARPAL TUNNEL RELEASE  ~ 2010   left with radius fracture repair  . COLONOSCOPY  2000; 2005; 03/15/2011   diminutve adenoma--recall 2017  . CORONARY ANGIOPLASTY  11/22/2011   3 vessels  . CORONARY ANGIOPLASTY WITH STENT PLACEMENT  11/22/2011   DES to LAD  . KNEE ARTHROSCOPY W/ MENISCAL REPAIR  1990's   right  . LEFT HEART CATHETERIZATION WITH CORONARY ANGIOGRAM N/A 11/22/2011   Procedure: LEFT HEART CATHETERIZATION WITH CORONARY ANGIOGRAM;  Surgeon: Laverda Page, MD;  Location: [facility] CATH LAB;  Service: Cardiovascular;  Laterality: N/A;  . nasal cautery    . PERCUTANEOUS CORONARY STENT INTERVENTION (PCI-S)  11/22/2011   Procedure: PERCUTANEOUS CORONARY STENT INTERVENTION (PCI-S);  Surgeon: Laverda Page, MD;  Location: [facility] CATH LAB;  Service: Cardiovascular;;  . SKIN GRAFT  04/2009   right thigh to right "wrist all the way to my shoulder; necrotizing fasciitis      Social History:     Social History  Substance Use Topics  . Smoking status: Former Smoker    Packs/day: 0.00    Years: 40.00    Quit date: 08/15/2014  . Smokeless tobacco: Never Used     Comment: Counseling sheet to quit smoking given in exam room   . Alcohol use No     Comment: 11/22/2011 "like crazy til 1988; nothing since"       Family History :     Family History  Problem Relation Age of Onset  . Alzheimer's disease Mother   . Colon cancer Neg Hx      Home Medications:   Prior to Admission medications   Medication Sig Start Date End Date Taking? Authorizing Provider  albuterol (PROVENTIL) (2.5 MG/3ML) 0.083% nebulizer solution Take 2.5 mg by nebulization every 6 (six) hours as needed for wheezing or shortness of breath.   Yes Historical Provider, MD  aspirin 81 MG chewable tablet Chew 81 mg by mouth daily.  Yes Historical Provider, MD  atorvastatin (LIPITOR) 10 MG tablet Take 10 mg by mouth daily.   Yes Historical Provider, MD  b complex vitamins tablet Take 1 tablet by mouth daily.   Yes Historical  Provider, MD  carvedilol (COREG) 6.25 MG tablet Take 6.25 mg by mouth 2 (two) times daily. 02/17/16  Yes Historical Provider, MD  DULoxetine (CYMBALTA) 60 MG capsule TAKE (1) CAPSULE BY MOUTH TWICE DAILY. 02/18/16  Yes Norman Clay, MD  Fluticasone-Salmeterol (ADVAIR) 250-50 MCG/DOSE AEPB Inhale 1 puff into the lungs 2 (two) times daily as needed.    Yes Historical Provider, MD  hydrOXYzine (ATARAX/VISTARIL) 25 MG tablet Take 1 tablet (25 mg total) by mouth 3 (three) times daily as needed. Patient taking differently: Take 25 mg by mouth 3 (three) times daily as needed for anxiety.  02/18/16  Yes Norman Clay, MD  levalbuterol [facility] HFA) 45 MCG/ACT inhaler Inhale 2 puffs into the lungs every 4 (four) hours as needed for wheezing.   Yes Historical Provider, MD  Multiple Vitamins-Minerals (THEREMS-M) TABS Take 1 tablet by mouth daily.   Yes Historical Provider, MD  nitroGLYCERIN (NITROSTAT) 0.3 MG SL tablet Place 0.4 mg under the tongue every 5 (five) minutes as needed for chest pain.   Yes Historical Provider, MD  NUVIGIL 250 MG tablet TAKE 1 TABLET BY MOUTH TWICE DAILY. Patient taking differently: TAKE 1 TABLET DAILY 10/02/15  Yes Wardell Honour, MD  omeprazole (PRILOSEC) 20 MG capsule Take 1 capsule (20 mg total) by mouth daily. 01/26/16  Yes Jola Schmidt, MD  Thiamine HCl (VITAMIN B-1 PO) Take 1 tablet by mouth daily.   Yes Historical Provider, MD  traZODone (DESYREL) 50 MG tablet Take 1 tablet by mouth at bedtime as needed for sleep. 02/15/16  Yes Historical Provider, MD  VITAMIN A PO Take 1 tablet by mouth daily.   Yes Historical Provider, MD  vitamin C (ASCORBIC ACID) 500 MG tablet Take 1,000 mg by mouth 2 (two) times daily.    Yes Historical Provider, MD  VITAMIN E PO Take 1 tablet by mouth daily.   Yes Historical Provider, MD  ARIPiprazole (ABILIFY) 2 MG tablet Take 1 tablet (2 mg total) by mouth daily. Patient not taking: Reported on 03/01/2016 02/18/16 02/17/17  Norman Clay, MD  ondansetron  (ZOFRAN ODT) 8 MG disintegrating tablet Take 1 tablet (8 mg total) by mouth every 8 (eight) hours as needed for nausea or vomiting. Patient not taking: Reported on 03/01/2016 01/26/16   Jola Schmidt, MD     Allergies:     Allergies  Allergen Reactions  . Ultram [Tramadol Hcl] Other (See Comments)    Seizures; "grand mal; twice"  . Gabapentin Other (See Comments)    Made pt dilusional  . Lyrica [Pregabalin] Other (See Comments)    Leg swelling.     Physical Exam:   Vitals  Blood pressure 118/75, pulse 78, temperature 98.7 F (37.1 C), temperature source Oral, resp. rate 18, height 5\' 11"  (1.803 m), weight 88.5 kg (195 lb), SpO2 92 %.   1. General 65 year old male lying in bed in NAD,    2. Normal affect and insight, Not Suicidal or Homicidal, Awake Alert, Oriented X 3.  3. No F.N deficits, ALL C.Nerves Intact, Strength 5/5 all 4 extremities, Sensation intact all 4 extremities, Plantars down going.  4. Ears and Eyes appear Normal, Conjunctivae clear, PERRLA. Moist Oral Mucosa.  5. Supple Neck, No JVD, No cervical lymphadenopathy appriciated, No Carotid Bruits.  6.  Symmetrical Chest wall movement, Good air movement bilaterally, CTAB.  7. RRR, No Gallops, Rubs or Murmurs, No Parasternal Heave.  8. Positive Bowel Sounds, Abdomen Soft, No tenderness, No organomegaly appriciated,No rebound -guarding or rigidity. Foley in place.  9.  No Cyanosis, Normal Skin Turgor, No Skin Rash or Bruise.  10. Good muscle tone,  joints appear normal , no effusions, Normal ROM.  11. No Palpable Lymph Nodes in Neck or Axillae   Data Review:    CBC  Recent Labs Lab 03/01/16 0929  WBC 11.3*  HGB 8.3*  HCT 27.4*  PLT 341  MCV 91.6  MCH 27.8  MCHC 30.3  RDW 14.9   ------------------------------------------------------------------------------------------------------------------  Chemistries   Recent Labs Lab 03/01/16 0929  NA 139  K 3.7  CL 104  CO2 29  GLUCOSE 117*  BUN  13  CREATININE 1.28*  CALCIUM 8.5*  AST 19  ALT 11*  ALKPHOS 89  BILITOT 0.3   ------------------------------------------------------------------------------------------------------------------ estimated creatinine clearance is 62.1 mL/min (by C-G formula based on SCr of 1.28 mg/dL (H)). ------------------------------------------------------------------------------------------------------------------ No results for input(s): TSH, T4TOTAL, T3FREE, THYROIDAB in the last 72 hours.  Invalid input(s): FREET3  Coagulation profile No results for input(s): INR, PROTIME in the last 168 hours. -------------------------------------------------------------------------------------------------------------------  Recent Labs  03/01/16 1013  DDIMER 0.66*   -------------------------------------------------------------------------------------------------------------------  Cardiac Enzymes  Recent Labs Lab 03/01/16 1013  TROPONINI 0.05*   ------------------------------------------------------------------------------------------------------------------ No results found for: BNP   ---------------------------------------------------------------------------------------------------------------  Urinalysis    Component Value Date/Time   COLORURINE YELLOW 03/01/2016 0923   APPEARANCEUR CLEAR 03/01/2016 0923   LABSPEC 1.010 03/01/2016 0923   PHURINE 7.0 03/01/2016 0923   GLUCOSEU NEGATIVE 03/01/2016 0923   HGBUR NEGATIVE 03/01/2016 0923   BILIRUBINUR NEGATIVE 03/01/2016 0923   KETONESUR NEGATIVE 03/01/2016 0923   PROTEINUR NEGATIVE 03/01/2016 0923   UROBILINOGEN 2.0 (H) 11/15/2014 1745   NITRITE POSITIVE (A) 03/01/2016 0923   LEUKOCYTESUR SMALL (A) 03/01/2016 0923    ----------------------------------------------------------------------------------------------------------------   Imaging Results:    Dg Chest 2 View  Result Date: 03/01/2016 CLINICAL DATA:  Shortness of breath.   COPD. EXAM: CHEST  2 VIEW COMPARISON:  01/11/2016, 04/06/2015, 11/15/2014 FINDINGS: The heart and mediastinal contours are within normal limits for size and stable. Mild atherosclerotic calcifications seen in the thoracic aortic arch. The lungs demonstrate emphysematous changes. There is a vague 7 mm nodular density projecting over the right lung base and an anterior rib on the frontal view, not seen on prior chest radiographs. Otherwise, the lungs are clear. No airspace disease, effusion, or pneumothorax identified. IMPRESSION: COPD. Vague 7 mm nodular density seen at the right lung base on the frontal view of the chest. Consider repeat frontal view of the chest with nipple markers in place. Electronically Signed   By: Curlene Dolphin M.D.   On: 03/01/2016 11:40    My personal review of EKG: Rhythm NSR with flipped T waves in the lateral leads    Assessment & Plan:   1. ? Upper GI bleed Based on his symptoms and presentation likely intermittent upper GI bleed caused from aspirin intake. Patient has epigastric pain, mild nausea, Hemoglobin 8.3 on admission, down from baseline of 13 one month ago. Will transfuse 1 untis of PRBCs. Hold aspirin.   on IV PPI and keep NPO medications. [facility] has been consulted for EGD likely tomorrow, will be transferred [facility].    2. NSTEMI. Caused by demand ischemia from severe anemia, Initial troponin of  0.05.  EKG shows flipped T-waves in the lateral leads. He has exertional chest pain but currently chest pain-free. Will  trend troponin and continue beta blocker. Cardiologist Dr. Einar Gip has been consulted, he will see the patient at [facility]. Currently will have to hold aspirin due to #1. Likely NSTEMI being caused by severe anemia causing demand ischemia. If troponin trends to true ACS levels aspirin use and be reconsidered based on risks and benefits.  3.Acute anemia on top of  Anemia of chronic disease. Due to #1 above,  Hemoglobin has dropped from 13 to 8.3 in the last month. Will transfuse 1 units of PRBCs. Check LDH and haptoglobin, monitor H&H on PPI.   4.. Chronic diastolic CHF. EF 60% on echocardiogram done 1 year ago, Patient presents with exertional shortness of breath which is due to demand ischemia from #1 and #2 above, no rales on exam, no peripheral edema, his clinically compensated from CHF standpoint, we'll get low-dose IV Lasix after 1 unit transfusion.    5. CAD. S/p 3 stent placements. Hard urologist Dr. Einar Gip plan as in #1 and 2 above. Continue on coreg and hold aspirin for now.   6. COPD. Stable.   7. Hx of PE. Patient is not on any coagulation at the time. Although d-dimer is borderline high but this is due to anemia and possible ongoing upper GI bleed, at rest he is not short of breath or tachycardic clinically do not suspect PE at all.   8. HTN. Pressures are stable continue Coreg.  9. Hypothyroidism. Continue synthroid.  10. CK D stage III. Baseline creatinine close to 1.3. Monitor.  12. MS with chronic generalized weakness, neurogenic bladder with indwelling Foley. 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    DVT Prophylaxis SCDs   AM Labs Ordered, also please review Full Orders  Family Communication: Admission, patients condition and plan of care including tests being ordered have been discussed with the patient who indicate understanding and agree with the plan and Code Status.  Code Status DNR   Likely DC to  Home  Condition GUARDED    Consults called: GI   Admission status: Transfer to [facility]   Time spent in minutes : 35 minutes    Kenyon Ana M.D on 03/01/2016 at 1:41 PM  Between 7am to 7pm - Pager - [phone]. After 7pm go to [email or web address] - password  [email or web address]  [facility] - Office  [phone]    By signing my name below, I, Collene Leyden, attest that this documentation has been prepared under the direction and in the presence of Lala Lund , MD. Electronically signed: Collene Leyden, Scribe. 03/01/16

## 2016-03-01 NOTE — ED Notes (Signed)
ED Provider at bedside. 

## 2016-03-01 NOTE — ED Notes (Signed)
[facility] here with pt and also notified of trop level of 0.05

## 2016-03-02 ENCOUNTER — Inpatient Hospital Stay (HOSPITAL_COMMUNITY): Payer: Medicare Other

## 2016-03-02 ENCOUNTER — Encounter (HOSPITAL_COMMUNITY): Payer: Self-pay | Admitting: General Practice

## 2016-03-02 DIAGNOSIS — R1013 Epigastric pain: Secondary | ICD-10-CM

## 2016-03-02 DIAGNOSIS — D638 Anemia in other chronic diseases classified elsewhere: Secondary | ICD-10-CM

## 2016-03-02 DIAGNOSIS — I2510 Atherosclerotic heart disease of native coronary artery without angina pectoris: Secondary | ICD-10-CM

## 2016-03-02 DIAGNOSIS — R0602 Shortness of breath: Secondary | ICD-10-CM

## 2016-03-02 DIAGNOSIS — D500 Iron deficiency anemia secondary to blood loss (chronic): Secondary | ICD-10-CM

## 2016-03-02 LAB — TYPE AND SCREEN
ABO/RH(D): O POS
ANTIBODY SCREEN: NEGATIVE
UNIT DIVISION: 0

## 2016-03-02 LAB — CBC
HEMATOCRIT: 28.4 % — AB (ref 39.0–52.0)
HEMOGLOBIN: 8.7 g/dL — AB (ref 13.0–17.0)
MCH: 27.4 pg (ref 26.0–34.0)
MCHC: 30.6 g/dL (ref 30.0–36.0)
MCV: 89.6 fL (ref 78.0–100.0)
Platelets: 278 10*3/uL (ref 150–400)
RBC: 3.17 MIL/uL — AB (ref 4.22–5.81)
RDW: 15.4 % (ref 11.5–15.5)
WBC: 9.3 10*3/uL (ref 4.0–10.5)

## 2016-03-02 LAB — BASIC METABOLIC PANEL
ANION GAP: 7 (ref 5–15)
BUN: 11 mg/dL (ref 6–20)
CHLORIDE: 108 mmol/L (ref 101–111)
CO2: 25 mmol/L (ref 22–32)
Calcium: 8.3 mg/dL — ABNORMAL LOW (ref 8.9–10.3)
Creatinine, Ser: 1.42 mg/dL — ABNORMAL HIGH (ref 0.61–1.24)
GFR, EST AFRICAN AMERICAN: 59 mL/min — AB (ref >60)
GFR, EST NON AFRICAN AMERICAN: 51 mL/min — AB (ref >60)
Glucose, Bld: 108 mg/dL — ABNORMAL HIGH (ref 65–99)
POTASSIUM: 4.5 mmol/L (ref 3.5–5.1)
SODIUM: 140 mmol/L (ref 135–145)

## 2016-03-02 LAB — MRSA PCR SCREENING: MRSA by PCR: POSITIVE — AB

## 2016-03-02 LAB — PROTIME-INR
INR: 1.01
Prothrombin Time: 13.3 seconds (ref 11.4–15.2)

## 2016-03-02 LAB — HAPTOGLOBIN: Haptoglobin: 291 mg/dL — ABNORMAL HIGH (ref 34–200)

## 2016-03-02 MED ORDER — TECHNETIUM TC 99M DIETHYLENETRIAME-PENTAACETIC ACID: 30.0000 | Freq: Once | INTRAVENOUS | Status: DC | PRN

## 2016-03-02 MED ORDER — MUPIROCIN 2 % EX OINT
1.0000 "application " | TOPICAL_OINTMENT | Freq: Two times a day (BID) | CUTANEOUS | Status: DC
  Administered 2016-03-02 – 2016-03-05 (×7): 1 via NASAL
  Filled 2016-03-02 (×2): qty 22

## 2016-03-02 MED ORDER — METOCLOPRAMIDE HCL 5 MG/ML IJ SOLN
10.0000 mg | Freq: Once | INTRAMUSCULAR | Status: AC
  Administered 2016-03-04: 10 mg via INTRAVENOUS
  Filled 2016-03-02: qty 2

## 2016-03-02 MED ORDER — PANTOPRAZOLE SODIUM 40 MG PO TBEC
40.0000 mg | DELAYED_RELEASE_TABLET | Freq: Two times a day (BID) | ORAL | Status: DC
  Administered 2016-03-02 – 2016-03-05 (×6): 40 mg via ORAL
  Filled 2016-03-02 (×7): qty 1

## 2016-03-02 MED ORDER — PEG-KCL-NACL-NASULF-NA ASC-C 100 G PO SOLR: 1.0000 | Freq: Once | ORAL | Status: DC

## 2016-03-02 MED ORDER — PEG-KCL-NACL-NASULF-NA ASC-C 100 G PO SOLR: 0.5000 | Freq: Once | ORAL | Status: DC

## 2016-03-02 MED ORDER — SODIUM CHLORIDE 0.9 % IV SOLN: Freq: Once | INTRAVENOUS | Status: DC

## 2016-03-02 MED ORDER — METOCLOPRAMIDE HCL 5 MG/ML IJ SOLN
10.0000 mg | Freq: Once | INTRAMUSCULAR | Status: AC
  Administered 2016-03-03: 10 mg via INTRAVENOUS
  Filled 2016-03-02: qty 2

## 2016-03-02 MED ORDER — PEG-KCL-NACL-NASULF-NA ASC-C 100 G PO SOLR
0.5000 | Freq: Once | ORAL | Status: DC
  Filled 2016-03-02: qty 1

## 2016-03-02 MED ORDER — TECHNETIUM TO 99M ALBUMIN AGGREGATED: 4.0000 | Freq: Once | INTRAVENOUS | Status: DC | PRN

## 2016-03-02 MED ORDER — SODIUM CHLORIDE 0.9 % IV SOLN
INTRAVENOUS | Status: DC
  Administered 2016-03-02 – 2016-03-05 (×2): via INTRAVENOUS

## 2016-03-02 MED ORDER — HYDROMORPHONE HCL 1 MG/ML IJ SOLN
1.0000 mg | INTRAMUSCULAR | Status: DC | PRN
  Administered 2016-03-02 – 2016-03-05 (×19): 1 mg via INTRAVENOUS
  Filled 2016-03-02 (×20): qty 1

## 2016-03-02 MED ORDER — CHLORHEXIDINE GLUCONATE CLOTH 2 % EX PADS
6.0000 | MEDICATED_PAD | Freq: Every day | CUTANEOUS | Status: DC
  Administered 2016-03-02 – 2016-03-04 (×2): 6 via TOPICAL

## 2016-03-02 NOTE — Consult Note (Signed)
CARDIOLOGY CONSULT NOTE  Patient ID: Dale Shields MRN: [id number] DOB/AGE: 1950-12-06 65 y.o.  Admit date: 03/01/2016 Referring Physician  Dale Lund, MD Primary Physician:  Dale Blitz, MD Reason for Consultation  Abnormal S. Troponin, Chest pain  HPI: [name] is a pleasant 65 year old Caucasian male with history of CAD, (11/22/11: Stent Proximal LAD with 3.5x12 Xience, D1 Scoring balloon PTCA with 3x6 mm PTCA. 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  Patient had been doing well until about 2-3 weeks ago he started noticing abdominal discomfort and lower chest pain.  Associated with marked dyspnea on exertion.  Patient also noticed severe abdominal discomfort ongoing for the past 2-3 weeks.  No black stools or bloody stools.  Patient states that over the past 4-6 weeks, he has been taking frequent NSAIDs due to severe back pain and joint pain as the pain clinic is markedly reduced his dose of the narcotics.  He was admitted with severe anemia and received blood transfusion.  Patient denied any bloody stools or dark stools.  No nausea or vomiting.  No dizziness or syncope.  He attributes his not feeling well to less narcotics being given to him at the pain clinic although he states that he does not have abuse potential and in fact he had reduce the dose of pain medicines himself over the last 2 years.  He denies any symptoms that suggest angina pectoris prior to his angioplasty.  Past Medical History:  Diagnosis Date  . Anemia of chronic disease   . Anxiety   . Anxiety and depression   . BPH (benign prostatic hypertrophy)   . CAD (coronary artery disease) 11/2011   Stents x 3  . Carpal  tunnel syndrome   . COPD (chronic obstructive pulmonary disease) ([place])   . DDD (degenerative disc disease)    knees, hips, shoulders--Dr. [facility] prior to this)  . Depression   . Dyspnea   . Endocarditis 2012  . Exertional dyspnea   . Fibromyalgia   . GERD (gastroesophageal reflux disease)   . H/O hiatal hernia   . Heart murmur   . History of blood transfusion    "gave me too much coumadin and heparin once"  . History of epistaxis   . History of gout   . History of MRSA infection 2012   UTI, bacteremia, and endocarditis  . History of renal failure 2011   "on dialysis 3 times over 2 years"  . HLD (hyperlipidemia)   . Hydronephrosis   . Hypertension   . Hypothyroidism   . Insomnia   . Multiple sclerosis ([place])    with optic neuritis  . Myocardial infarction    "according to stress test; that's news to me"  . Narcolepsy   . Necrotizing fasciitis ([place])   . Neurogenic bladder    chronic indwelling foley catheter-changed monthly  . Optic neuritis   . OSA (obstructive sleep apnea)    "tried CPAP; lost more sleep w/use"  . Osteoarthritis of multiple joints    "everywhere"  . Pulmonary embolism ([place])    "suspected; never confirmed"--? treated with coumadin  . Scoliosis   . Steroid-induced psychosis   . Supraspinatus tendon tear 2012  . Thrombocytopenia ([place])  Past Surgical History:  Procedure Laterality Date  . CARPAL TUNNEL RELEASE  ~ 2010   left with radius fracture repair  . COLONOSCOPY  2000; 2005; 03/15/2011   diminutve adenoma--recall 2017  . CORONARY ANGIOPLASTY  11/22/2011   3 vessels  . CORONARY ANGIOPLASTY WITH STENT PLACEMENT  11/22/2011   DES to LAD  . KNEE ARTHROSCOPY W/ MENISCAL REPAIR  1990's   right  . LEFT HEART CATHETERIZATION WITH CORONARY ANGIOGRAM N/A 11/22/2011   Procedure: LEFT HEART CATHETERIZATION WITH CORONARY ANGIOGRAM;  Surgeon: Dale Page, MD;  Location: [facility] CATH LAB;  Service: Cardiovascular;  Laterality: N/A;  . nasal cautery     . PERCUTANEOUS CORONARY STENT INTERVENTION (PCI-S)  11/22/2011   Procedure: PERCUTANEOUS CORONARY STENT INTERVENTION (PCI-S);  Surgeon: Dale Page, MD;  Location: [facility] CATH LAB;  Service: Cardiovascular;;  . SKIN GRAFT  04/2009   right thigh to right "wrist all the way to my shoulder; necrotizing fasciitis     Family History  Problem Relation Age of Onset  . Alzheimer's disease Mother   . Colon cancer Neg Hx      Social History: Social History   Social History  . Marital status: Single    Spouse name: N/A  . Number of children: N/A  . Years of education: N/A   Occupational History  . Not on file.   Social History Main Topics  . Smoking status: Former Smoker    Packs/day: 0.00    Years: 40.00    Quit date: 08/15/2014  . Smokeless tobacco: Never Used     Comment: Counseling sheet to quit smoking given in exam room   . Alcohol use No     Comment: 11/22/2011 "like crazy til 1988; nothing since"  . Drug use: No     Comment: *8//13/13 "1986 til 1988 like crazy; nothing since"  . Sexual activity: Not Currently   Other Topics Concern  . Not on file   Social History Narrative   Never married, no children.   Orig from [place], [place], lived a long time in [place].   Has been in [place] since about 2004.   College at [employer] "for years".   Denies alcohol or drug use.   Cigarettes: 50 pack-yr hx--current as of 03/2013.     Prescriptions Prior to Admission  Medication Sig Dispense Refill Last Dose  . albuterol (PROVENTIL) (2.5 MG/3ML) 0.083% nebulizer solution Take 2.5 mg by nebulization every 6 (six) hours as needed for wheezing or shortness of breath.   03/01/2016 at Unknown time  . aspirin 81 MG chewable tablet Chew 81 mg by mouth daily.    03/01/2016 at Unknown time  . atorvastatin (LIPITOR) 10 MG tablet Take 10 mg by mouth daily.   02/29/2016 at Unknown time  . b complex vitamins tablet Take 1 tablet by mouth daily.   02/29/2016 at Unknown time  . carvedilol (COREG)  6.25 MG tablet Take 6.25 mg by mouth 2 (two) times daily.   02/29/2016 at 0600  . DULoxetine (CYMBALTA) 60 MG capsule TAKE (1) CAPSULE BY MOUTH TWICE DAILY. 60 capsule 2 02/29/2016 at Unknown time  . Fluticasone-Salmeterol (ADVAIR) 250-50 MCG/DOSE AEPB Inhale 1 puff into the lungs 2 (two) times daily as needed.    02/29/2016 at Unknown time  . hydrOXYzine (ATARAX/VISTARIL) 25 MG tablet Take 1 tablet (25 mg total) by mouth 3 (three) times daily as needed. (Patient taking differently: Take 25 mg by mouth 3 (three) times daily as needed for anxiety. )  90 tablet 2 unknown  . levalbuterol (XOPENEX HFA) 45 MCG/ACT inhaler Inhale 2 puffs into the lungs every 4 (four) hours as needed for wheezing.   unknown  . Multiple Vitamins-Minerals (THEREMS-M) TABS Take 1 tablet by mouth daily.   02/29/2016 at Unknown time  . nitroGLYCERIN (NITROSTAT) 0.3 MG SL tablet Place 0.4 mg under the tongue every 5 (five) minutes as needed for chest pain.   03/01/2016 at Unknown time  . NUVIGIL 250 MG tablet TAKE 1 TABLET BY MOUTH TWICE DAILY. (Patient taking differently: TAKE 1 TABLET DAILY) 60 tablet 0 02/29/2016 at Unknown time  . omeprazole (PRILOSEC) 20 MG capsule Take 1 capsule (20 mg total) by mouth daily. 60 capsule 0 02/29/2016 at Unknown time  . Thiamine HCl (VITAMIN B-1 PO) Take 1 tablet by mouth daily.   02/29/2016 at Unknown time  . traZODone (DESYREL) 50 MG tablet Take 1 tablet by mouth at bedtime as needed for sleep.   Past Week at Unknown time  . VITAMIN A PO Take 1 tablet by mouth daily.   02/29/2016 at Unknown time  . vitamin C (ASCORBIC ACID) 500 MG tablet Take 1,000 mg by mouth 2 (two) times daily.    02/29/2016 at Unknown time  . VITAMIN E PO Take 1 tablet by mouth daily.   02/29/2016 at Unknown time  . ARIPiprazole (ABILIFY) 2 MG tablet Take 1 tablet (2 mg total) by mouth daily. (Patient not taking: Reported on 03/01/2016) 30 tablet 2 Not Taking at Unknown time  . ondansetron (ZOFRAN ODT) 8 MG disintegrating  tablet Take 1 tablet (8 mg total) by mouth every 8 (eight) hours as needed for nausea or vomiting. (Patient not taking: Reported on 03/01/2016) 10 tablet 0 Completed Course at Unknown time     ROS: General: Chronic fatigue.  no fevers/chills/night sweats Eyes: no blurry vision, diplopia, or amaurosis ENT: no sore throat or hearing loss Resp: Has dyspnea on exertion. No cough, wheezing, or hemoptysis CV: no edema or palpitations GI: Has  abdominal pain, nausea, No vomiting, diarrhea, or constipation GU: Chronic indwelling catheter Skin: no rash Neuro: no headache, numbness, tingling, or weakness of extremities Musculoskeletal: Chronic back pain Heme: no bleeding, DVT, or easy bruising Endo: no polydipsia or polyuria    Physical Exam: Blood pressure (!) 145/77, pulse 76, temperature 98.6 F (37 C), temperature source Oral, resp. rate 18, height '5\' 9"'  (1.753 m), weight 85.7 kg (189 lb), SpO2 96 %.   General appearance: alert, cooperative, appears older than stated age and no distress Lungs: clear to auscultation bilaterally Heart: regular rate and rhythm, S1, S2 normal, no murmur, click, rub or gallop Abdomen: there is no rebound tenderness, bowel sounds heard in all 4 quadrants, mild epigastric tenderness present. Extremities: edema 2+ bilateral, full range of motion movements. Pulses: Femoral pulse 2+ bilateral, absent popliteal and pedal pulses.  Capillary refill normal. Neurologic: Grossly normal  Labs:   Lab Results  Component Value Date   WBC 9.3 03/02/2016   HGB 8.7 (L) 03/02/2016   HCT 28.4 (L) 03/02/2016   MCV 89.6 03/02/2016   PLT 278 03/02/2016    Recent Labs Lab 03/01/16 0929 03/02/16 0400  NA 139 140  K 3.7 4.5  CL 104 108  CO2 29 25  BUN 13 11  CREATININE 1.28* 1.42*  CALCIUM 8.5* 8.3*  PROT 6.8  --   BILITOT 0.3  --   ALKPHOS 89  --   ALT 11*  --   AST 19  --  GLUCOSE 117* 108*    Lipid Panel     Component Value Date/Time   CHOL 111  09/29/2010 1814   TRIG 130 09/29/2010 1814   HDL 17 (L) 09/29/2010 1814   CHOLHDL 6.5 09/29/2010 1814   VLDL 26 09/29/2010 1814   LDLCALC 68 09/29/2010 1814    BNP (last 3 results)  Recent Labs  03/01/16 1419  BNP 828.0*    Recent Labs  04/06/15 1549  03/01/16 1319 03/01/16 1419 03/01/16 1854  CKTOTAL 94  --   --   --   --   TROPONINI <0.03  < > 0.05* 0.06* 0.05*  < > = values in this interval not displayed.    Radiology: Dg Chest 1 View  Result Date: 03/01/2016 CLINICAL DATA:  Nodular density projecting over right lower lung on previous x-ray. Repeat chest x-ray with nipple markers. EXAM: CHEST 1 VIEW COMPARISON:  Chest x-ray earlier today. FINDINGS: Repeat film with bilateral nipple markers no longer demonstrates any concerning nodular densities bilaterally. There is no evidence of pulmonary edema, consolidation, pneumothorax, nodule or pleural fluid. IMPRESSION: No further suspicious nodular densities identified, particularly overlying the right lower lung. Electronically Signed   By: Aletta Edouard M.D.   On: 03/01/2016 13:48   Dg Chest 2 View  Result Date: 03/01/2016 CLINICAL DATA:  Shortness of breath.  COPD. EXAM: CHEST  2 VIEW COMPARISON:  01/11/2016, 04/06/2015, 11/15/2014 FINDINGS: The heart and mediastinal contours are within normal limits for size and stable. Mild atherosclerotic calcifications seen in the thoracic aortic arch. The lungs demonstrate emphysematous changes. There is a vague 7 mm nodular density projecting over the right lung base and an anterior rib on the frontal view, not seen on prior chest radiographs. Otherwise, the lungs are clear. No airspace disease, effusion, or pneumothorax identified. IMPRESSION: COPD. Vague 7 mm nodular density seen at the right lung base on the frontal view of the chest. Consider repeat frontal view of the chest with nipple markers in place. Electronically Signed   By: Curlene Dolphin M.D.   On: 03/01/2016 11:40   Nm  Pulmonary Perf And Vent  Result Date: 03/02/2016 CLINICAL DATA:  Shortness of Breath EXAM: NUCLEAR MEDICINE VENTILATION - PERFUSION LUNG SCAN VIEWS: Anterior, posterior, left lateral, right lateral, RPO, LPO, RAO, LAO -ventilation and perfusion RADIOPHARMACEUTICALS:  32.0 mCi Technetium-40mDTPA aerosol inhalation and 4.3 mCi Technetium-948mAA IV COMPARISON:  Chest radiograph March 01, 2016 FINDINGS: Ventilation: There are scattered subsegmental ventilation defects bilaterally. There is decreased ventilation overall throughout much of the right lower lobe compared to other areas. Perfusion: There are matching subsegmental defects with the ventilation study. Decreased uptake throughout much of the right lower lobe matches the corresponding ventilation defect. There are no appreciable ventilation/perfusion mismatches. IMPRESSION: No appreciable ventilation/perfusion mismatch. There is a moderate matching ventilation and perfusion defect in the right lower lobe. This study constitutes an overall low probability of pulmonary embolus by PIOPED II criteria. Electronically Signed   By: WiLowella GripII M.D.   On: 03/02/2016 14:50    Scheduled Meds: . atorvastatin  10 mg Oral Daily  . B-complex with vitamin C  1 tablet Oral Daily  . carvedilol  6.25 mg Oral BID  . Chlorhexidine Gluconate Cloth  6 each Topical Q0600  . DULoxetine  60 mg Oral BID  . furosemide  20 mg Intravenous Once  . [START ON 03/03/2016] metoCLOPramide (REGLAN) injection  10 mg Intravenous Once   Followed by  . [START ON 03/04/2016] metoCLOPramide (  REGLAN) injection  10 mg Intravenous Once  . mometasone-formoterol  2 puff Inhalation BID  . multivitamin with minerals  1 tablet Oral Daily  . mupirocin ointment  1 application Nasal BID  . nitroGLYCERIN  0.5 inch Topical Q6H  . pantoprazole  40 mg Oral BID  . [START ON 03/03/2016] peg 3350 powder  0.5 kit Oral Once   And  . [START ON 03/04/2016] peg 3350 powder  0.5 kit Oral  Once  . sodium chloride flush  3 mL Intravenous Q12H  . thiamine  100 mg Oral Daily   Continuous Infusions: . sodium chloride 75 mL/hr at 03/02/16 1028   PRN Meds:.albuterol, albuterol, diphenhydrAMINE, HYDROcodone-acetaminophen, HYDROmorphone (DILAUDID) injection, nitroGLYCERIN, ondansetron **OR** ondansetron (ZOFRAN) IV, technetium albumin aggregated, technetium TC 51M diethylenetriame-pentaacetic acid, traZODone    EKG 03/01/2016 at 14:15 hours: Normal sinus rhythm at the rate of 67 bpm, poor R-wave progression, probably normal variant.  Borderline low voltage complexes.  No evidence of ischemia.  Compared to EKG done at 10:51 AM, nonspecific inferior and lateral T wave inversion no longer present.  Echo: 04/07/2015:  Left ventricle: The cavity size was normal. Wall thickness was   normal. Systolic function was normal. The estimated ejection   fraction was in the range of 60% to 65%. Doppler parameters are   consistent with abnormal left ventricular relaxation (grade 1   diastolic dysfunction).  Lexiscan myoview stress test 11/03/2014: 1. The resting electrocardiogram demonstrated normal sinus rhythm, normal resting conduction, no resting arrhythmias and normal rest repolarization. Stress EKG is non-diagnostic for ischemia as it a pharmacologic stress using Lexiscan. Stress symptoms included dyspnea. 2. Gated SPECT images reveal normal myocardial thickening and wall motion. Gated SPECT images reveal normal myocardial thickening and wall motion. The left ventricular ejection fraction was calculated or visually estimated to be 68%. This is a low risk study.  ASSESSMENT AND PLAN:  1.  Coronary artery disease is the native vessel with history of proximal LAD stent with a 3.5 x 12 mm Xience DES.  Diagonal 1 branch of LAD scoring balloon angioplasty.  Distal RCA scoring balloon angioplasty on 11/22/2011.  Last stress test in my office on 11/03/2014 was nonischemic. 2.  Severe COPD due to  underlying tobacco use disorder, 3.  Abnormal serum troponin probably nonspecific in the setting of severe acute anemia.  GI consultation has been obtained. 4.  History of tobacco use disorder, quit in 2015. 5.  Hypertension, controlled 6.  Hyperlipidemia.  Recommendation: I have evaluated his medical records and is symptoms and physical examination are not consistent with ACS.  Agree with present management including consideration for GI workup although there is no obvious GI bleed.  I will leave it to the expertise of gastroenterologist.  Please call if questions.   Adrian Prows, MD 03/02/2016, 9:56 PM [facility]. [place] Pager: [phone] Office: [phone] If no answer Cell [phone]

## 2016-03-02 NOTE — Progress Notes (Signed)
Patient lying in bed, pain meds given, no other needs at this time. Call light within reach

## 2016-03-02 NOTE — Progress Notes (Signed)
*  PRELIMINARY RESULTS* Vascular Ultrasound ilateral lower extremity venous duplex has been completed.  Preliminary findings: No evidence of deep vein thrombosis or baker's cysts bilaterally.   Everrett Coombe 03/02/2016, 11:28 AM

## 2016-03-02 NOTE — Progress Notes (Signed)
[facility] PROGRESS NOTE  Dale Shields [id number] DOB: 1950/08/26 DOA: 03/01/2016   PCP: Monico Blitz, MD     Assessment/Plan: Principal Problem:   SOB (shortness of breath) Active Problems:   Multiple sclerosis (HCC)   COPD (chronic obstructive pulmonary disease) (HCC)   CAD (coronary artery disease)   Hematuria   Generalized weakness   FTT (failure to thrive) in adult   Anemia of chronic disease   Depression   Anemia   NSTEMI (non-ST elevated myocardial infarction) ([place])   65 y.o. male, with a history of COPD, CAD s/p 3 stent placements, GERD, HTN, hypothyroidism, CHF, MS and neurogenic bladder with chronic indwelling foley, presents with complaints of exertional shortness of breath, epigastric pain, and exertional Left-sided dull chest pain in his left shoulder. He has associated generalized  weakness. While in the ED, EKG revealed flipped lateral leads, mildly +ve Trop. Hemoccult was negative.   Asian currently is relatively symptom-free he denies any headache, no fever chills, currently chest pain-free, does have a mild epigastric ache which is constant associated with mild nausea, denies any emesis, denies noticing any blood in stool, no black colored stool. He is getting 1 unit of packed RBC transfused in the ER, IV PPI, I have already consulted Dr. Einar Gip his cardiologist and Velora Heckler GI PA sera Trixie Deis, patient will be transferred to [facility] for further care.  Assessment and plan  1. Anemia-hemoglobin drop from baseline of 13-8.3 between 10/17 and 11/21? Reason for anemia unclear, suspected upper GI bleed based on dyspepsia/epigastric pain.  caused from aspirin intake. Patient has epigastric pain, mild nausea, status post  1 untis of PRBCs. Hold aspirin.   on IV PPI and keep NPO medications. [facility] has been consulted for EGD, will be transferred to [facility] from [facility].  CT 10/17  Showed Gastric antral/ pyloric wall  thickening which could reflect severe gastritis, ulcer, or mass ; correlation with upper endoscopy recommended to exclude gastric neoplasm. Patient schedule for EGD today  2. Epigastric pain, concern for underlying ulcer/gastric malignancy, doubt NSTEMI. Troponin flat, severe anemia-keep hemoglobin greater than 8.0, repeat troponin of 0.05.  EKG shows flipped T-waves in the lateral leads. He has exertional chest pain but currently chest pain-free. Consult Cardiologist Dr. Einar Gip if  needed. Currently will have to hold aspirin due to #1.   3.Acute anemia on top of  Anemia of chronic disease. Due to #1 above, Hemoglobin has dropped from 13 to 8.3 in the last month. Will transfuse 1 units of PRBCs. LDH, vitamin B-12, INR, reticulocyte count within normal limits  4.. Chronic diastolic CHF. EF 60% on echocardiogram done 1 year ago, Patient presents with exertional shortness of breath which is due to demand ischemia from #1 and #2 above, no rales on exam, no peripheral edema, his clinically compensated from CHF standpoint, we'll get low-dose IV Lasix after 1 unit transfusion.    5. CAD. S/p 3 stent placements.  . Continue on coreg and hold aspirin for now.   6. COPD. Stable.   7. Hx of PE. Abnormal d-dimer, Patient is not on any coagulation at the time. Although d-dimer is borderline high but this is due to anemia and possible ongoing upper GI bleed,  VQ scan/venous Doppler to rule out DVT/PE  8. HTN. Pressures are stable continue Coreg.  9. Hypothyroidism. Continue synthroid.  10. CK D stage III. Baseline creatinine close to 1.3. Monitor.  12. MS with chronic generalized weakness, neurogenic  bladder with indwelling Foley. 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      DVT  prophylaxsis SCDs  Code Status:  DO NOT RESUSCITATE     Family Communication: Discussed in detail with the patient, all imaging results, lab results explained to the patient   Disposition Plan:  1-2 days     Consultants:  GI    Procedures:  None  Antibiotics: Anti-infectives    Start     Dose/Rate Route Frequency Ordered Stop   03/01/16 1330  cefTRIAXone (ROCEPHIN) 1 g in dextrose 5 % 50 mL IVPB     1 g 100 mL/hr over 30 Minutes Intravenous  Once 03/01/16 1326 03/01/16 1459         HPI/Subjective: Complaining of epigastric pain, denies any nausea,  Objective: Vitals:   03/01/16 1832 03/01/16 1930 03/01/16 2130 03/02/16 0621  BP: 118/74 109/77 136/89 136/79  Pulse: 75 78 71 93  Resp: 18  16 18   Temp: 98.7 F (37.1 C)  98.2 F (36.8 C) 98.7 F (37.1 C)  TempSrc: Oral  Oral Oral  SpO2: 93% 93% 95% 95%  Weight:   85.7 kg (189 lb) 85.7 kg (189 lb)  Height:   5\' 9"  (1.753 m)     Intake/Output Summary (Last 24 hours) at 03/02/16 0758 Last data filed at 03/02/16 0700  Gross per 24 hour  Intake             1280 ml  Output              220 ml  Net             1060 ml    Exam:  Examination:  General exam: Appears calm and comfortable  Respiratory system: Clear to auscultation. Respiratory effort normal. Cardiovascular system: S1 & S2 heard, RRR. No JVD, murmurs, rubs, gallops or clicks. No pedal edema. Gastrointestinal system: Abdomen is nondistended, soft and nontender. No organomegaly or masses felt. Normal bowel sounds heard. Central nervous system: Alert and oriented. No focal neurological deficits. Extremities: Symmetric 5 x 5 power. Skin: No rashes, lesions or ulcers Psychiatry: Judgement and insight appear normal. Mood & affect appropriate.     Data Reviewed: I have personally reviewed following labs and imaging studies  Micro Results No results found for this or any previous visit (from the past 240 hour(s)).  Radiology Reports Dg Chest 1  View  Result Date: 03/01/2016 CLINICAL DATA:  Nodular density projecting over right lower lung on previous x-ray. Repeat chest x-ray with nipple markers. EXAM: CHEST 1 VIEW COMPARISON:  Chest x-ray earlier today. FINDINGS: Repeat film with bilateral nipple markers no longer demonstrates any concerning nodular densities bilaterally. There is no evidence of pulmonary edema, consolidation, pneumothorax, nodule or pleural fluid. IMPRESSION: No further suspicious nodular densities identified, particularly overlying the right lower lung. Electronically Signed   By: Aletta Edouard M.D.   On: 03/01/2016 13:48   Dg Chest 2 View  Result Date: 03/01/2016 CLINICAL DATA:  Shortness of breath.  COPD. EXAM: CHEST  2 VIEW COMPARISON:  01/11/2016, 04/06/2015, 11/15/2014 FINDINGS: The heart and mediastinal contours are within normal limits for size and stable. Mild atherosclerotic calcifications seen in the thoracic aortic arch. The lungs demonstrate emphysematous changes. There is a vague 7 mm nodular density projecting over the right  lung base and an anterior rib on the frontal view, not seen on prior chest radiographs. Otherwise, the lungs are clear. No airspace disease, effusion, or pneumothorax identified. IMPRESSION: COPD. Vague 7 mm nodular density seen at the right lung base on the frontal view of the chest. Consider repeat frontal view of the chest with nipple markers in place. Electronically Signed   By: Curlene Dolphin M.D.   On: 03/01/2016 11:40     CBC  Recent Labs Lab 03/01/16 0929 03/01/16 2208 03/02/16 0400  WBC 11.3*  --  9.3  HGB 8.3* 8.5* 8.7*  HCT 27.4* 27.6* 28.4*  PLT 341  --  278  MCV 91.6  --  89.6  MCH 27.8  --  27.4  MCHC 30.3  --  30.6  RDW 14.9  --  15.4    Chemistries   Recent Labs Lab 03/01/16 0929 03/02/16 0400  NA 139 140  K 3.7 4.5  CL 104 108  CO2 29 25  GLUCOSE 117* 108*  BUN 13 11  CREATININE 1.28* 1.42*  CALCIUM 8.5* 8.3*  AST 19  --   ALT 11*  --    ALKPHOS 89  --   BILITOT 0.3  --    ------------------------------------------------------------------------------------------------------------------ estimated creatinine clearance is 57 mL/min (by C-G formula based on SCr of 1.42 mg/dL (H)). ------------------------------------------------------------------------------------------------------------------ No results for input(s): HGBA1C in the last 72 hours. ------------------------------------------------------------------------------------------------------------------ No results for input(s): CHOL, HDL, LDLCALC, TRIG, CHOLHDL, LDLDIRECT in the last 72 hours. ------------------------------------------------------------------------------------------------------------------ No results for input(s): TSH, T4TOTAL, T3FREE, THYROIDAB in the last 72 hours.  Invalid input(s): FREET3 ------------------------------------------------------------------------------------------------------------------  Recent Labs  03/01/16 1415 03/01/16 1419  VITAMINB12 1,429*  --   FOLATE 26.3  --   FERRITIN 7*  --   TIBC 284  --   IRON 12*  --   RETICCTPCT  --  1.6    Coagulation profile  Recent Labs Lab 03/02/16 0400  INR 1.01     Recent Labs  03/01/16 1013  DDIMER 0.66*    Cardiac Enzymes  Recent Labs Lab 03/01/16 1319 03/01/16 1419 03/01/16 1854  TROPONINI 0.05* 0.06* 0.05*   ------------------------------------------------------------------------------------------------------------------ Invalid input(s): POCBNP   CBG: No results for input(s): GLUCAP in the last 168 hours.     Studies: Dg Chest 1 View  Result Date: 03/01/2016 CLINICAL DATA:  Nodular density projecting over right lower lung on previous x-ray. Repeat chest x-ray with nipple markers. EXAM: CHEST 1 VIEW COMPARISON:  Chest x-ray earlier today. FINDINGS: Repeat film with bilateral nipple markers no longer demonstrates any concerning nodular densities  bilaterally. There is no evidence of pulmonary edema, consolidation, pneumothorax, nodule or pleural fluid. IMPRESSION: No further suspicious nodular densities identified, particularly overlying the right lower lung. Electronically Signed   By: Aletta Edouard M.D.   On: 03/01/2016 13:48   Dg Chest 2 View  Result Date: 03/01/2016 CLINICAL DATA:  Shortness of breath.  COPD. EXAM: CHEST  2 VIEW COMPARISON:  01/11/2016, 04/06/2015, 11/15/2014 FINDINGS: The heart and mediastinal contours are within normal limits for size and stable. Mild atherosclerotic calcifications seen in the thoracic aortic arch. The lungs demonstrate emphysematous changes. There is a vague 7 mm nodular density projecting over the right lung base and an anterior rib on the frontal view, not seen on prior chest radiographs. Otherwise, the lungs are clear. No airspace disease, effusion, or pneumothorax identified. IMPRESSION: COPD. Vague 7 mm nodular density seen at the right lung base on the frontal view of the chest. Consider  repeat frontal view of the chest with nipple markers in place. Electronically Signed   By: Curlene Dolphin M.D.   On: 03/01/2016 11:40      Lab Results  Component Value Date   HGBA1C  08/29/2009    5.6 (NOTE)                                                                       According to the ADA Clinical Practice Recommendations for 2011, when HbA1c is used as a screening test:   >=6.5%   Diagnostic of Diabetes Mellitus           (if abnormal result  is confirmed)  5.7-6.4%   Increased risk of developing Diabetes Mellitus  References:Diagnosis and Classification of Diabetes Mellitus,Diabetes [id number] 1):S62-S69 and Standards of Medical Care in         Diabetes - 2011,Diabetes [id number]  (Suppl 1):S11-S61.   Lab Results  Component Value Date   LDLCALC 68 09/29/2010   CREATININE 1.42 (H) 03/02/2016       Scheduled Meds: . atorvastatin  10 mg Oral Daily  . B-complex with vitamin C  1  tablet Oral Daily  . carvedilol  6.25 mg Oral BID  . DULoxetine  60 mg Oral BID  . furosemide  20 mg Intravenous Once  . mometasone-formoterol  2 puff Inhalation BID  . multivitamin with minerals  1 tablet Oral Daily  . nitroGLYCERIN  0.5 inch Topical Q6H  . [START ON 03/05/2016] pantoprazole  40 mg Intravenous Q12H  . sodium chloride flush  3 mL Intravenous Q12H  . thiamine  100 mg Oral Daily   Continuous Infusions: . sodium chloride    . pantoprozole (PROTONIX) infusion 8 mg/hr (03/01/16 2331)     LOS: 1 day    Time spent: >30 MINS    [facility]  [facility] Pager [phone]. If 7PM-7AM, please contact night-coverage at [email or web address], password [facility] 03/02/2016, 7:58 AM  LOS: 1 day

## 2016-03-03 DIAGNOSIS — R935 Abnormal findings on diagnostic imaging of other abdominal regions, including retroperitoneum: Secondary | ICD-10-CM

## 2016-03-03 DIAGNOSIS — D509 Iron deficiency anemia, unspecified: Secondary | ICD-10-CM

## 2016-03-03 LAB — COMPREHENSIVE METABOLIC PANEL
ALBUMIN: 2.7 g/dL — AB (ref 3.5–5.0)
ALT: 10 U/L — ABNORMAL LOW (ref 17–63)
ANION GAP: 5 (ref 5–15)
AST: 15 U/L (ref 15–41)
Alkaline Phosphatase: 80 U/L (ref 38–126)
BILIRUBIN TOTAL: 0.3 mg/dL (ref 0.3–1.2)
BUN: 11 mg/dL (ref 6–20)
CO2: 28 mmol/L (ref 22–32)
Calcium: 8.5 mg/dL — ABNORMAL LOW (ref 8.9–10.3)
Chloride: 108 mmol/L (ref 101–111)
Creatinine, Ser: 1.34 mg/dL — ABNORMAL HIGH (ref 0.61–1.24)
GFR, EST NON AFRICAN AMERICAN: 54 mL/min — AB (ref >60)
GLUCOSE: 111 mg/dL — AB (ref 65–99)
POTASSIUM: 3.9 mmol/L (ref 3.5–5.1)
Sodium: 141 mmol/L (ref 135–145)
TOTAL PROTEIN: 5.7 g/dL — AB (ref 6.5–8.1)

## 2016-03-03 LAB — CBC
HEMATOCRIT: 28.4 % — AB (ref 39.0–52.0)
Hemoglobin: 8.8 g/dL — ABNORMAL LOW (ref 13.0–17.0)
MCH: 27.4 pg (ref 26.0–34.0)
MCHC: 31 g/dL (ref 30.0–36.0)
MCV: 88.5 fL (ref 78.0–100.0)
Platelets: 303 10*3/uL (ref 150–400)
RBC: 3.21 MIL/uL — ABNORMAL LOW (ref 4.22–5.81)
RDW: 14.9 % (ref 11.5–15.5)
WBC: 9.2 10*3/uL (ref 4.0–10.5)

## 2016-03-03 NOTE — Progress Notes (Signed)
Progress Note   Subjective  Patient without any new complaints. Seen by cardiology overnight, they have no plans for further cardiac testing. He has thought about what we discussed yesterday and declined colonoscopy at this time. He says he will do it if EGD negative. He wants to eat solid food today.   Objective   Vital signs in last 24 hours: Temp:  [98 F (36.7 C)-98.6 F (37 C)] 98.4 F (36.9 C) (11/23 0600) Pulse Rate:  [73-76] 73 (11/23 0600) Resp:  [16-19] 16 (11/23 0600) BP: (127-145)/(77-92) 127/82 (11/23 0600) SpO2:  [95 %-97 %] 96 % (11/23 0600) Weight:  [189 lb 12.8 oz (86.1 kg)] 189 lb 12.8 oz (86.1 kg) (11/23 0600) Last BM Date: 02/29/16 General:    white male in NAD Heart:  Regular rate and rhythm; no murmurs Lungs: Respirations even and unlabored, lungs CTA bilaterally Abdomen:  Soft, moderate tenderness to palpation in epigastric area, no rebound or guarding, nondistended. Normal bowel sounds. Extremities:  Without edema. Neurologic:  Alert and oriented,  grossly normal neurologically. Psych:  Cooperative. Normal mood and affect.  Intake/Output from previous day: 11/22 0701 - 11/23 0700 In: -  Out: 620 [Urine:620] Intake/Output this shift: No intake/output data recorded.  Lab Results:  Recent Labs  03/01/16 0929 03/01/16 2208 03/02/16 0400 03/03/16 0223  WBC 11.3*  --  9.3 9.2  HGB 8.3* 8.5* 8.7* 8.8*  HCT 27.4* 27.6* 28.4* 28.4*  PLT 341  --  278 303   BMET  Recent Labs  03/01/16 0929 03/02/16 0400 03/03/16 0223  NA 139 140 141  K 3.7 4.5 3.9  CL 104 108 108  CO2 29 25 28   GLUCOSE 117* 108* 111*  BUN 13 11 11   CREATININE 1.28* 1.42* 1.34*  CALCIUM 8.5* 8.3* 8.5*   LFT  Recent Labs  03/03/16 0223  PROT 5.7*  ALBUMIN 2.7*  AST 15  ALT 10*  ALKPHOS 80  BILITOT 0.3   PT/INR  Recent Labs  03/02/16 0400  LABPROT 13.3  INR 1.01    Studies/Results: Dg Chest 1 View  Result Date: 03/01/2016 CLINICAL DATA:  Nodular  density projecting over right lower lung on previous x-ray. Repeat chest x-ray with nipple markers. EXAM: CHEST 1 VIEW COMPARISON:  Chest x-ray earlier today. FINDINGS: Repeat film with bilateral nipple markers no longer demonstrates any concerning nodular densities bilaterally. There is no evidence of pulmonary edema, consolidation, pneumothorax, nodule or pleural fluid. IMPRESSION: No further suspicious nodular densities identified, particularly overlying the right lower lung. Electronically Signed   By: Aletta Edouard M.D.   On: 03/01/2016 13:48   Dg Chest 2 View  Result Date: 03/01/2016 CLINICAL DATA:  Shortness of breath.  COPD. EXAM: CHEST  2 VIEW COMPARISON:  01/11/2016, 04/06/2015, 11/15/2014 FINDINGS: The heart and mediastinal contours are within normal limits for size and stable. Mild atherosclerotic calcifications seen in the thoracic aortic arch. The lungs demonstrate emphysematous changes. There is a vague 7 mm nodular density projecting over the right lung base and an anterior rib on the frontal view, not seen on prior chest radiographs. Otherwise, the lungs are clear. No airspace disease, effusion, or pneumothorax identified. IMPRESSION: COPD. Vague 7 mm nodular density seen at the right lung base on the frontal view of the chest. Consider repeat frontal view of the chest with nipple markers in place. Electronically Signed   By: Curlene Dolphin M.D.   On: 03/01/2016 11:40   Nm Pulmonary Perf And Vent  Result Date: 03/02/2016 CLINICAL DATA:  Shortness of Breath EXAM: NUCLEAR MEDICINE VENTILATION - PERFUSION LUNG SCAN VIEWS: Anterior, posterior, left lateral, right lateral, RPO, LPO, RAO, LAO -ventilation and perfusion RADIOPHARMACEUTICALS:  32.0 mCi Technetium-52m DTPA aerosol inhalation and 4.3 mCi Technetium-71m MAA IV COMPARISON:  Chest radiograph March 01, 2016 FINDINGS: Ventilation: There are scattered subsegmental ventilation defects bilaterally. There is decreased ventilation overall  throughout much of the right lower lobe compared to other areas. Perfusion: There are matching subsegmental defects with the ventilation study. Decreased uptake throughout much of the right lower lobe matches the corresponding ventilation defect. There are no appreciable ventilation/perfusion mismatches. IMPRESSION: No appreciable ventilation/perfusion mismatch. There is a moderate matching ventilation and perfusion defect in the right lower lobe. This study constitutes an overall low probability of pulmonary embolus by PIOPED II criteria. Electronically Signed   By: Lowella Grip III M.D.   On: 03/02/2016 14:50       Assessment / Plan:   65 y/o male with multiple comorbidities presenting with severe iron deficiency anemia over the past month, drop in Hgb about 5 grams. He does not see blood in the stools however he is color blind. Ongoing epigastric pain and with CT abnormality of the stomach, I suspect EGD will most likely give an etiology for his anemia and symptoms. However, he is due for colonoscopy as well, although with only diminutive adenoma on his last colonoscopy 5 years ago, I doubt that he has colon cancer or an advanced colon polyp.    I recommend an EGD and colonoscopy to evaluate these issues and expedite his evaluation. After discussion of this he wishes to forgo the colonoscopy initially in hopes we find the etiology on EGD which is reasonable. He understands if EGD fails to yield an etiology, then we recommend he stay in the hospital for colonoscopy. His case will be done with MAC. Unfortunately we do not have anesthesia assistance on Thanksgiving and his procedure is scheduled for Friday morning. I discussed risks / benefits of endoscopy and anesthesia, he wished to proceed.   Continue PPI, NPO after MN, solid food okay today. Please call with questions.   Woodburn Cellar, MD [facility] Pager [phone]

## 2016-03-03 NOTE — Progress Notes (Addendum)
[facility] PROGRESS NOTE  Dale Shields [id number] DOB: [birth year] DOA: 03/01/2016   PCP: Monico Blitz, MD     Assessment/Plan: Principal Problem:   SOB (shortness of breath) Active Problems:   Multiple sclerosis (HCC)   COPD (chronic obstructive pulmonary disease) (HCC)   CAD (coronary artery disease)   Hematuria   Generalized weakness   FTT (failure to thrive) in adult   Anemia of chronic disease   Depression   Anemia   NSTEMI (non-ST elevated myocardial infarction) ([place])   65 y.o. male, with a history of COPD, CAD s/p 3 stent placements, GERD, HTN, hypothyroidism, CHF, MS and neurogenic bladder with chronic indwelling foley, presents with complaints of exertional shortness of breath, epigastric pain, and exertional Left-sided dull chest pain in his left shoulder. He has associated generalized  weakness. While in the ED, EKG revealed flipped lateral leads, mildly +ve Trop. Hemoccult was negative.   Asian currently is relatively symptom-free he denies any headache, no fever chills, currently chest pain-free, does have a mild epigastric ache which is constant associated with mild nausea, denies any emesis, denies noticing any blood in stool, no black colored stool. He is getting 1 unit of packed RBC transfused in the ER, IV PPI, I have already consulted Dr. Einar Gip his cardiologist and Velora Heckler GI PA sera Trixie Deis, patient will be transferred to [facility] for further care.  Assessment and plan  1. Anemia-hemoglobin drop from baseline of 13-8.3 between 10/17 and 11/21? Reason for anemia unclear, suspected upper GI bleed based on dyspepsia/epigastric pain.  caused from aspirin intake. Patient has epigastric pain, mild nausea, status post  1 untis of PRBCs. Hold aspirin.   on IV PPI and keep NPO medications. [facility] has been consulted for EGD, will be transferred to [facility] from [facility]  CT 10/17  Showed Gastric antral/ pyloric wall  thickening which could reflect severe gastritis, ulcer, or mass ; correlation with upper endoscopy recommended to exclude gastric neoplasm.  Patient agreeable to proceed with endoscopy tomorrow  2. Epigastric pain, concern for underlying ulcer/gastric malignancy, doubt NSTEMI. Troponin flat, severe anemia-keep hemoglobin greater than 8.0, repeat troponin of 0.05.  EKG shows flipped T-waves in the lateral leads. He has exertional chest pain but currently chest pain-free. Consult Cardiologist Dr. Einar Gip if  needed. Currently will have to hold aspirin due to #1.   3.Acute anemia on top of  Anemia of chronic disease. Due to #1 above, Hemoglobin has dropped from 13 to 8.3 in the last month. Will transfuse 1 units of PRBCs. LDH, vitamin B-12, INR, reticulocyte count within normal limits  4.. Chronic diastolic CHF. EF 60% on echocardiogram done 1 year ago, Patient presents with exertional shortness of breath which is due to demand ischemia from #1 and #2 above, no rales on exam, no peripheral edema, his clinically compensated from CHF standpoint, we'll get low-dose IV Lasix after 1 unit transfusion.    5. CAD.   native vessel with history of proximal LAD stent with a 3.5 x 12 mm Xience DES.  Diagonal 1 branch of LAD scoring balloon angioplasty.  Distal RCA scoring balloon angioplasty on 11/22/2011.  Last stress test in my office on 11/03/2014 was nonischemic.  Marland Kitchen Continue on coreg and hold aspirin for now. Seen by Adrian Prows, MD  6. COPD. Stable.   7. Hx of PE. Abnormal d-dimer, Patient is not on any coagulation at the time. Although d-dimer is borderline high but this is due  to anemia and possible ongoing upper GI bleed,  VQ scan/venous Doppler negative for DVT/PE  8. HTN. Pressures are stable continue Coreg.  9. Hypothyroidism. Continue synthroid.  10. CK D stage III. Baseline creatinine close to 1.3. Monitor.  12. MS with chronic generalized weakness, neurogenic bladder with indwelling Foley. 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      DVT prophylaxsis SCDs  Code Status:  DO NOT RESUSCITATE     Family Communication: Discussed in detail with the patient, all imaging results, lab results explained to the patient   Disposition Plan:  1-2 days     Consultants:  GI    Procedures:  None  Antibiotics: Anti-infectives    Start     Dose/Rate Route Frequency Ordered Stop   03/01/16 1330  cefTRIAXone (ROCEPHIN) 1 g in dextrose 5 % 50 mL IVPB     1 g 100 mL/hr over 30 Minutes Intravenous  Once 03/01/16 1326 03/01/16 1459         HPI/Subjective: No further episodes of bleeding overnight  Objective: Vitals:   03/02/16 2055 03/03/16 0600 03/03/16 0913 03/03/16 1030  BP: (!) 145/77 127/82  124/73  Pulse: 76 73    Resp: 18 16    Temp: 98.6 F (37 C) 98.4 F (36.9 C)    TempSrc: Oral Oral    SpO2: 96% 96% 96%   Weight:  86.1 kg (189 lb 12.8 oz)    Height:        Intake/Output Summary (Last 24 hours) at 03/03/16 1208 Last data filed at 03/03/16 1129  Gross per 24 hour  Intake                0 ml  Output             1920 ml  Net            -1920 ml    Exam:  Examination:  General exam: Appears calm and comfortable  Respiratory system: Clear to auscultation. Respiratory effort normal. Cardiovascular system: S1 & S2 heard, RRR. No JVD, murmurs, rubs, gallops or clicks. No pedal edema. Gastrointestinal system: Abdomen is nondistended, soft and nontender. No organomegaly or masses felt. Normal bowel sounds heard. Central nervous system: Alert and oriented. No focal neurological deficits. Extremities: Symmetric 5 x 5 power. Skin: No rashes, lesions or ulcers Psychiatry: Judgement and insight appear normal. Mood & affect appropriate.     Data  Reviewed: I have personally reviewed following labs and imaging studies  Micro Results Recent Results (from the past 240 hour(s))  Urine culture     Status: Abnormal (Preliminary result)   Collection Time: 03/01/16 10:11 AM  Result Value Ref Range Status   Specimen Description URINE, CLEAN CATCH  Final   Special Requests NONE  Final   Culture (A)  Final    >=100,000 COLONIES/mL ESCHERICHIA COLI SUSCEPTIBILITIES TO FOLLOW Performed at [facility]    Report Status PENDING  Incomplete  MRSA PCR Screening     Status: Abnormal   Collection Time: 03/02/16  4:51 AM  Result Value Ref Range Status   MRSA by PCR POSITIVE (A) NEGATIVE Final    Comment:        The GeneXpert MRSA Assay (FDA approved for NASAL specimens only), is one component of a  comprehensive MRSA colonization surveillance program. It is not intended to diagnose MRSA infection nor to guide or monitor treatment for MRSA infections. RESULT CALLED TO, READ BACK BY AND VERIFIED WITH: L. RICHARDSON [id number] N. MORRIS     Radiology Reports Dg Chest 1 View  Result Date: 03/01/2016 CLINICAL DATA:  Nodular density projecting over right lower lung on previous x-ray. Repeat chest x-ray with nipple markers. EXAM: CHEST 1 VIEW COMPARISON:  Chest x-ray earlier today. FINDINGS: Repeat film with bilateral nipple markers no longer demonstrates any concerning nodular densities bilaterally. There is no evidence of pulmonary edema, consolidation, pneumothorax, nodule or pleural fluid. IMPRESSION: No further suspicious nodular densities identified, particularly overlying the right lower lung. Electronically Signed   By: Aletta Edouard M.D.   On: 03/01/2016 13:48   Dg Chest 2 View  Result Date: 03/01/2016 CLINICAL DATA:  Shortness of breath.  COPD. EXAM: CHEST  2 VIEW COMPARISON:  01/11/2016, 04/06/2015, 11/15/2014 FINDINGS: The heart and mediastinal contours are within normal limits for size and stable. Mild atherosclerotic  calcifications seen in the thoracic aortic arch. The lungs demonstrate emphysematous changes. There is a vague 7 mm nodular density projecting over the right lung base and an anterior rib on the frontal view, not seen on prior chest radiographs. Otherwise, the lungs are clear. No airspace disease, effusion, or pneumothorax identified. IMPRESSION: COPD. Vague 7 mm nodular density seen at the right lung base on the frontal view of the chest. Consider repeat frontal view of the chest with nipple markers in place. Electronically Signed   By: Curlene Dolphin M.D.   On: 03/01/2016 11:40   Nm Pulmonary Perf And Vent  Result Date: 03/02/2016 CLINICAL DATA:  Shortness of Breath EXAM: NUCLEAR MEDICINE VENTILATION - PERFUSION LUNG SCAN VIEWS: Anterior, posterior, left lateral, right lateral, RPO, LPO, RAO, LAO -ventilation and perfusion RADIOPHARMACEUTICALS:  32.0 mCi Technetium-71m DTPA aerosol inhalation and 4.3 mCi Technetium-3m MAA IV COMPARISON:  Chest radiograph March 01, 2016 FINDINGS: Ventilation: There are scattered subsegmental ventilation defects bilaterally. There is decreased ventilation overall throughout much of the right lower lobe compared to other areas. Perfusion: There are matching subsegmental defects with the ventilation study. Decreased uptake throughout much of the right lower lobe matches the corresponding ventilation defect. There are no appreciable ventilation/perfusion mismatches. IMPRESSION: No appreciable ventilation/perfusion mismatch. There is a moderate matching ventilation and perfusion defect in the right lower lobe. This study constitutes an overall low probability of pulmonary embolus by PIOPED II criteria. Electronically Signed   By: Lowella Grip III M.D.   On: 03/02/2016 14:50     CBC  Recent Labs Lab 03/01/16 0929 03/01/16 2208 03/02/16 0400 03/03/16 0223  WBC 11.3*  --  9.3 9.2  HGB 8.3* 8.5* 8.7* 8.8*  HCT 27.4* 27.6* 28.4* 28.4*  PLT 341  --  278 303  MCV  91.6  --  89.6 88.5  MCH 27.8  --  27.4 27.4  MCHC 30.3  --  30.6 31.0  RDW 14.9  --  15.4 14.9    Chemistries   Recent Labs Lab 03/01/16 0929 03/02/16 0400 03/03/16 0223  NA 139 140 141  K 3.7 4.5 3.9  CL 104 108 108  CO2 29 25 28   GLUCOSE 117* 108* 111*  BUN 13 11 11   CREATININE 1.28* 1.42* 1.34*  CALCIUM 8.5* 8.3* 8.5*  AST 19  --  15  ALT 11*  --  10*  ALKPHOS 89  --  80  BILITOT 0.3  --  0.3   ------------------------------------------------------------------------------------------------------------------ estimated creatinine clearance is 60.6 mL/min (by C-G formula based on SCr of 1.34 mg/dL (H)). ------------------------------------------------------------------------------------------------------------------ No results for input(s): HGBA1C in the last 72 hours. ------------------------------------------------------------------------------------------------------------------ No results for input(s): CHOL, HDL, LDLCALC, TRIG, CHOLHDL, LDLDIRECT in the last 72 hours. ------------------------------------------------------------------------------------------------------------------ No results for input(s): TSH, T4TOTAL, T3FREE, THYROIDAB in the last 72 hours.  Invalid input(s): FREET3 ------------------------------------------------------------------------------------------------------------------  Recent Labs  03/01/16 1415 03/01/16 1419  VITAMINB12 1,429*  --   FOLATE 26.3  --   FERRITIN 7*  --   TIBC 284  --   IRON 12*  --   RETICCTPCT  --  1.6    Coagulation profile  Recent Labs Lab 03/02/16 0400  INR 1.01     Recent Labs  03/01/16 1013  DDIMER 0.66*    Cardiac Enzymes  Recent Labs Lab 03/01/16 1319 03/01/16 1419 03/01/16 1854  TROPONINI 0.05* 0.06* 0.05*   ------------------------------------------------------------------------------------------------------------------ Invalid input(s): POCBNP   CBG: No results for input(s):  GLUCAP in the last 168 hours.     Studies: Dg Chest 1 View  Result Date: 03/01/2016 CLINICAL DATA:  Nodular density projecting over right lower lung on previous x-ray. Repeat chest x-ray with nipple markers. EXAM: CHEST 1 VIEW COMPARISON:  Chest x-ray earlier today. FINDINGS: Repeat film with bilateral nipple markers no longer demonstrates any concerning nodular densities bilaterally. There is no evidence of pulmonary edema, consolidation, pneumothorax, nodule or pleural fluid. IMPRESSION: No further suspicious nodular densities identified, particularly overlying the right lower lung. Electronically Signed   By: Aletta Edouard M.D.   On: 03/01/2016 13:48   Nm Pulmonary Perf And Vent  Result Date: 03/02/2016 CLINICAL DATA:  Shortness of Breath EXAM: NUCLEAR MEDICINE VENTILATION - PERFUSION LUNG SCAN VIEWS: Anterior, posterior, left lateral, right lateral, RPO, LPO, RAO, LAO -ventilation and perfusion RADIOPHARMACEUTICALS:  32.0 mCi Technetium-45m DTPA aerosol inhalation and 4.3 mCi Technetium-109m MAA IV COMPARISON:  Chest radiograph March 01, 2016 FINDINGS: Ventilation: There are scattered subsegmental ventilation defects bilaterally. There is decreased ventilation overall throughout much of the right lower lobe compared to other areas. Perfusion: There are matching subsegmental defects with the ventilation study. Decreased uptake throughout much of the right lower lobe matches the corresponding ventilation defect. There are no appreciable ventilation/perfusion mismatches. IMPRESSION: No appreciable ventilation/perfusion mismatch. There is a moderate matching ventilation and perfusion defect in the right lower lobe. This study constitutes an overall low probability of pulmonary embolus by PIOPED II criteria. Electronically Signed   By: Lowella Grip III M.D.   On: 03/02/2016 14:50      Lab Results  Component Value Date   HGBA1C  08/29/2009    5.6 (NOTE)                                                                        According to the ADA Clinical Practice Recommendations for 2011, when HbA1c is used as a screening test:   >=6.5%   Diagnostic of Diabetes Mellitus           (if abnormal result  is confirmed)  5.7-6.4%   Increased risk of developing Diabetes Mellitus  References:Diagnosis and Classification of Diabetes Mellitus,Diabetes [id number] 1):S62-S69 and Standards of Medical Care in  Diabetes - 2011,Diabetes Care,2011,34  (Suppl 1):S11-S61.   Lab Results  Component Value Date   LDLCALC 68 09/29/2010   CREATININE 1.34 (H) 03/03/2016       Scheduled Meds: . atorvastatin  10 mg Oral Daily  . B-complex with vitamin C  1 tablet Oral Daily  . carvedilol  6.25 mg Oral BID  . Chlorhexidine Gluconate Cloth  6 each Topical Q0600  . DULoxetine  60 mg Oral BID  . furosemide  20 mg Intravenous Once  . metoCLOPramide (REGLAN) injection  10 mg Intravenous Once   Followed by  . [START ON 03/04/2016] metoCLOPramide (REGLAN) injection  10 mg Intravenous Once  . mometasone-formoterol  2 puff Inhalation BID  . multivitamin with minerals  1 tablet Oral Daily  . mupirocin ointment  1 application Nasal BID  . nitroGLYCERIN  0.5 inch Topical Q6H  . pantoprazole  40 mg Oral BID  . sodium chloride flush  3 mL Intravenous Q12H  . thiamine  100 mg Oral Daily   Continuous Infusions: . sodium chloride 75 mL/hr at 03/02/16 1028     LOS: 2 days    Time spent: >30 MINS    [facility] Pager [phone]. If 7PM-7AM, please contact night-coverage at [email or web address], password [facility] 03/03/2016, 12:08 PM  LOS: 2 days

## 2016-03-04 ENCOUNTER — Encounter (HOSPITAL_COMMUNITY)
Admission: EM | Disposition: A | Discharge status: Skilled Nursing Facility | Payer: Self-pay | Source: Home / Self Care | Attending: Internal Medicine

## 2016-03-04 ENCOUNTER — Inpatient Hospital Stay (HOSPITAL_COMMUNITY): Payer: Medicare Other | Admitting: Certified Registered Nurse Anesthetist

## 2016-03-04 ENCOUNTER — Encounter (HOSPITAL_COMMUNITY): Payer: Self-pay | Admitting: *Deleted

## 2016-03-04 DIAGNOSIS — K254 Chronic or unspecified gastric ulcer with hemorrhage: Secondary | ICD-10-CM

## 2016-03-04 DIAGNOSIS — J410 Simple chronic bronchitis: Secondary | ICD-10-CM

## 2016-03-04 DIAGNOSIS — D649 Anemia, unspecified: Secondary | ICD-10-CM

## 2016-03-04 HISTORY — PX: ESOPHAGOGASTRODUODENOSCOPY: SHX5428

## 2016-03-04 LAB — URINE CULTURE: Culture: >=100000 — AB

## 2016-03-04 SURGERY — EGD (ESOPHAGOGASTRODUODENOSCOPY)
Anesthesia: Monitor Anesthesia Care

## 2016-03-04 SURGERY — COLONOSCOPY
Anesthesia: Monitor Anesthesia Care

## 2016-03-04 MED ORDER — SODIUM CHLORIDE 0.9 % IV SOLN
INTRAVENOUS | Status: DC | PRN
  Administered 2016-03-04: 08:00:00 via INTRAVENOUS

## 2016-03-04 MED ORDER — SODIUM CHLORIDE 0.9 % IV SOLN: INTRAVENOUS | Status: DC

## 2016-03-04 MED ORDER — SUCRALFATE 1 G PO TABS: 1.0000 g | ORAL_TABLET | Freq: Three times a day (TID) | ORAL | 1 refills | Status: DC

## 2016-03-04 MED ORDER — SUCRALFATE 1 G PO TABS
1.0000 g | ORAL_TABLET | Freq: Three times a day (TID) | ORAL | Status: DC
  Administered 2016-03-04 – 2016-03-05 (×5): 1 g via ORAL
  Filled 2016-03-04 (×5): qty 1

## 2016-03-04 MED ORDER — LIDOCAINE 2% (20 MG/ML) 5 ML SYRINGE
INTRAMUSCULAR | Status: DC | PRN
  Administered 2016-03-04: 20 mg via INTRAVENOUS

## 2016-03-04 MED ORDER — PANTOPRAZOLE SODIUM 40 MG PO TBEC: 40.0000 mg | DELAYED_RELEASE_TABLET | Freq: Two times a day (BID) | ORAL | 1 refills | Status: DC

## 2016-03-04 MED ORDER — PROPOFOL 500 MG/50ML IV EMUL
INTRAVENOUS | Status: DC | PRN
  Administered 2016-03-04: 75 ug/kg/min via INTRAVENOUS

## 2016-03-04 MED ORDER — PROPOFOL 10 MG/ML IV BOLUS
INTRAVENOUS | Status: DC | PRN
  Administered 2016-03-04: 20 mg via INTRAVENOUS

## 2016-03-04 MED ORDER — DEXTROSE 5 % IV SOLN
1.0000 g | INTRAVENOUS | Status: DC
  Administered 2016-03-04 – 2016-03-05 (×2): 1 g via INTRAVENOUS
  Filled 2016-03-04 (×2): qty 10

## 2016-03-04 MED ORDER — AMOXICILLIN-POT CLAVULANATE 875-125 MG PO TABS: 1.0000 | ORAL_TABLET | Freq: Two times a day (BID) | ORAL | 0 refills | Status: AC

## 2016-03-04 MED ORDER — BUTAMBEN-TETRACAINE-BENZOCAINE 2-2-14 % EX AERO
INHALATION_SPRAY | CUTANEOUS | Status: DC | PRN
  Administered 2016-03-04: 2 via TOPICAL

## 2016-03-04 NOTE — Anesthesia Postprocedure Evaluation (Signed)
Anesthesia Post Note  Patient: Dale Shields  Procedure(s) Performed: Procedure(s) (LRB): ESOPHAGOGASTRODUODENOSCOPY (EGD) (N/A)  Patient location during evaluation: PACU Anesthesia Type: MAC Level of consciousness: awake and alert Pain management: pain level controlled Vital Signs Assessment: post-procedure vital signs reviewed and stable Respiratory status: spontaneous breathing, nonlabored ventilation, respiratory function stable and patient connected to nasal cannula oxygen Cardiovascular status: stable and blood pressure returned to baseline Anesthetic complications: no    Last Vitals:  Vitals:   03/04/16 0928 03/04/16 0930  BP: 140/84 140/84  Pulse: 78 74  Resp: (!) 21 (!) 21  Temp: 36.5 C     Last Pain:  Vitals:   03/04/16 0930  TempSrc:   PainSc: 5                  Elyjah Hazan,Adric S

## 2016-03-04 NOTE — Anesthesia Procedure Notes (Signed)
Procedure Name: MAC Date/Time: 03/04/2016 9:04 AM Performed by: Everlean Cherry A Pre-anesthesia Checklist: Patient identified, Emergency Drugs available, Suction available and Patient being monitored Patient Re-evaluated:Patient Re-evaluated prior to inductionOxygen Delivery Method: Nasal cannula Intubation Type: IV induction

## 2016-03-04 NOTE — H&P (View-Only) (Signed)
Progress Note   Subjective  Patient without any new complaints. Seen by cardiology overnight, they have no plans for further cardiac testing. He has thought about what we discussed yesterday and declined colonoscopy at this time. He says he will do it if EGD negative. He wants to eat solid food today.   Objective   Vital signs in last 24 hours: Temp:  [98 F (36.7 C)-98.6 F (37 C)] 98.4 F (36.9 C) (11/23 0600) Pulse Rate:  [73-76] 73 (11/23 0600) Resp:  [16-19] 16 (11/23 0600) BP: (127-145)/(77-92) 127/82 (11/23 0600) SpO2:  [95 %-97 %] 96 % (11/23 0600) Weight:  [189 lb 12.8 oz (86.1 kg)] 189 lb 12.8 oz (86.1 kg) (11/23 0600) Last BM Date: 02/29/16 General:    white male in NAD Heart:  Regular rate and rhythm; no murmurs Lungs: Respirations even and unlabored, lungs CTA bilaterally Abdomen:  Soft, moderate tenderness to palpation in epigastric area, no rebound or guarding, nondistended. Normal bowel sounds. Extremities:  Without edema. Neurologic:  Alert and oriented,  grossly normal neurologically. Psych:  Cooperative. Normal mood and affect.  Intake/Output from previous day: 11/22 0701 - 11/23 0700 In: -  Out: 620 [Urine:620] Intake/Output this shift: No intake/output data recorded.  Lab Results:  Recent Labs  03/01/16 0929 03/01/16 2208 03/02/16 0400 03/03/16 0223  WBC 11.3*  --  9.3 9.2  HGB 8.3* 8.5* 8.7* 8.8*  HCT 27.4* 27.6* 28.4* 28.4*  PLT 341  --  278 303   BMET  Recent Labs  03/01/16 0929 03/02/16 0400 03/03/16 0223  NA 139 140 141  K 3.7 4.5 3.9  CL 104 108 108  CO2 29 25 28   GLUCOSE 117* 108* 111*  BUN 13 11 11   CREATININE 1.28* 1.42* 1.34*  CALCIUM 8.5* 8.3* 8.5*   LFT  Recent Labs  03/03/16 0223  PROT 5.7*  ALBUMIN 2.7*  AST 15  ALT 10*  ALKPHOS 80  BILITOT 0.3   PT/INR  Recent Labs  03/02/16 0400  LABPROT 13.3  INR 1.01    Studies/Results: Dg Chest 1 View  Result Date: 03/01/2016 CLINICAL DATA:  Nodular  density projecting over right lower lung on previous x-ray. Repeat chest x-ray with nipple markers. EXAM: CHEST 1 VIEW COMPARISON:  Chest x-ray earlier today. FINDINGS: Repeat film with bilateral nipple markers no longer demonstrates any concerning nodular densities bilaterally. There is no evidence of pulmonary edema, consolidation, pneumothorax, nodule or pleural fluid. IMPRESSION: No further suspicious nodular densities identified, particularly overlying the right lower lung. Electronically Signed   By: Aletta Edouard M.D.   On: 03/01/2016 13:48   Dg Chest 2 View  Result Date: 03/01/2016 CLINICAL DATA:  Shortness of breath.  COPD. EXAM: CHEST  2 VIEW COMPARISON:  01/11/2016, 04/06/2015, 11/15/2014 FINDINGS: The heart and mediastinal contours are within normal limits for size and stable. Mild atherosclerotic calcifications seen in the thoracic aortic arch. The lungs demonstrate emphysematous changes. There is a vague 7 mm nodular density projecting over the right lung base and an anterior rib on the frontal view, not seen on prior chest radiographs. Otherwise, the lungs are clear. No airspace disease, effusion, or pneumothorax identified. IMPRESSION: COPD. Vague 7 mm nodular density seen at the right lung base on the frontal view of the chest. Consider repeat frontal view of the chest with nipple markers in place. Electronically Signed   By: Curlene Dolphin M.D.   On: 03/01/2016 11:40   Nm Pulmonary Perf And Vent  Result Date: 03/02/2016 CLINICAL DATA:  Shortness of Breath EXAM: NUCLEAR MEDICINE VENTILATION - PERFUSION LUNG SCAN VIEWS: Anterior, posterior, left lateral, right lateral, RPO, LPO, RAO, LAO -ventilation and perfusion RADIOPHARMACEUTICALS:  32.0 mCi Technetium-23m DTPA aerosol inhalation and 4.3 mCi Technetium-19m MAA IV COMPARISON:  Chest radiograph March 01, 2016 FINDINGS: Ventilation: There are scattered subsegmental ventilation defects bilaterally. There is decreased ventilation overall  throughout much of the right lower lobe compared to other areas. Perfusion: There are matching subsegmental defects with the ventilation study. Decreased uptake throughout much of the right lower lobe matches the corresponding ventilation defect. There are no appreciable ventilation/perfusion mismatches. IMPRESSION: No appreciable ventilation/perfusion mismatch. There is a moderate matching ventilation and perfusion defect in the right lower lobe. This study constitutes an overall low probability of pulmonary embolus by PIOPED II criteria. Electronically Signed   By: Lowella Grip III M.D.   On: 03/02/2016 14:50       Assessment / Plan:   65 y/o male with multiple comorbidities presenting with severe iron deficiency anemia over the past month, drop in Hgb about 5 grams. He does not see blood in the stools however he is color blind. Ongoing epigastric pain and with CT abnormality of the stomach, I suspect EGD will most likely give an etiology for his anemia and symptoms. However, he is due for colonoscopy as well, although with only diminutive adenoma on his last colonoscopy 5 years ago, I doubt that he has colon cancer or an advanced colon polyp.    I recommend an EGD and colonoscopy to evaluate these issues and expedite his evaluation. After discussion of this he wishes to forgo the colonoscopy initially in hopes we find the etiology on EGD which is reasonable. He understands if EGD fails to yield an etiology, then we recommend he stay in the hospital for colonoscopy. His case will be done with MAC. Unfortunately we do not have anesthesia assistance on Thanksgiving and his procedure is scheduled for Friday morning. I discussed risks / benefits of endoscopy and anesthesia, he wished to proceed.   Continue PPI, NPO after MN, solid food okay today. Please call with questions.   [place] Cellar, MD [facility] Pager [phone]

## 2016-03-04 NOTE — Evaluation (Signed)
Occupational Therapy Evaluation Patient Details Name: Dale Shields MRN: [id number] DOB: June 17, 1950 Today's Date: 03/04/2016    History of Present Illness 65 y.o. male, with a history of COPD, CAD s/p 3 stent placements, GERD, HTN, hypothyroidism, CHF, MS and neurogenic bladder with chronic indwelling foley, presents with complaints of exertional shortness of breath, epigastric pain, and exertional Left-sided dull chest pain in his left shoulder. He has associated generalized  weakness.    Clinical Impression   Pt reports he was managing ADL independently PTA. Currently pt requires min guard assist for ADL and functional mobility. Pt with SOB and audible wheezing during short distance functional mobility; SpO2 >95% on RA throughout. Recommending SNF for follow up to maximize independence and safety with ADL and functional mobility prior to return home. Pt would benefit from continued skilled OT to address established goals.    Follow Up Recommendations  SNF    Equipment Recommendations  None recommended by OT    Recommendations for Other Services       Precautions / Restrictions Precautions Precautions: Fall Precaution Comments: anxiety? Restrictions Weight Bearing Restrictions: No      Mobility Bed Mobility Overal bed mobility: Independent             General bed mobility comments: no physical assist required  Transfers Overall transfer level: Needs assistance Equipment used: None Transfers: Sit to/from Stand Sit to Stand: Supervision         General transfer comment: supervision for safety, no physical assist required    Balance Overall balance assessment: Needs assistance Sitting-balance support: Feet supported Sitting balance-Leahy Scale: Good       Standing balance-Leahy Scale: Poor Standing balance comment: relaince on UE support, but upon fatigue began to become unsteady with mobility                            ADL Overall  ADL's : Needs assistance/impaired Eating/Feeding: Independent;Sitting   Grooming: Set up;Supervision/safety;Sitting   Upper Body Bathing: Set up;Supervision/ safety;Sitting   Lower Body Bathing: Min guard;Sit to/from stand   Upper Body Dressing : Set up;Supervision/safety;Sitting   Lower Body Dressing: Min guard;Sit to/from stand   Toilet Transfer: Min guard;Ambulation;Regular Glass blower/designer Details (indicate cue type and reason): Simulated by sit to stand from EOB with functional mobility Toileting- Clothing Manipulation and Hygiene: Min guard;Sit to/from stand       Functional mobility during ADLs: Min guard General ADL Comments: Pt with SOB with audible weezing with short distance mobility. SpO2 in mid-high 90s throughout.     Vision     Perception     Praxis      Pertinent Vitals/Pain Pain Assessment: Faces Faces Pain Scale: No hurt     Hand Dominance Right   Extremity/Trunk Assessment Upper Extremity Assessment Upper Extremity Assessment: Overall WFL for tasks assessed   Lower Extremity Assessment Lower Extremity Assessment: Overall WFL for tasks assessed (Subjectively reports weakness however strength intact)   Cervical / Trunk Assessment Cervical / Trunk Assessment: Normal   Communication Communication Communication: No difficulties   Cognition Arousal/Alertness: Awake/alert Behavior During Therapy: Anxious Overall Cognitive Status: No family/caregiver present to determine baseline                     General Comments       Exercises       Shoulder Instructions      Home Living Family/patient expects to be discharged to:: Private  residence Living Arrangements: Alone   Type of Home: House Home Access: [place]: One level     Bathroom Shower/Tub: [place] (at sisters house ~100 yards away)   Biochemist, clinical: Standard (outside)     Home Equipment: Wheelchair - power;Walker - 2 wheels;Cane -  single point;Shower seat;Grab bars - tub/shower          Prior Functioning/Environment Level of Independence: Needs assistance  Gait / Transfers Assistance Needed: Pt reports he gets very SOB with short distance mobility. ADL's / Homemaking Assistance Needed: Gets a ride to his sisters house for showers. Gets SOB with dressing and minimal activity.            OT Problem List: Decreased strength;Decreased activity tolerance;Impaired balance (sitting and/or standing);Decreased knowledge of use of DME or AE;Cardiopulmonary status limiting activity;Pain   OT Treatment/Interventions: Self-care/ADL training;Energy conservation;DME and/or AE instruction;Therapeutic activities;Patient/family education;Balance training    OT Goals(Current goals can be found in the care plan section) Acute Rehab OT Goals Patient Stated Goal: rehab before home OT Goal Formulation: With patient Time For Goal Achievement: 03/18/16 Potential to Achieve Goals: Good ADL Goals Pt Will Perform Grooming: with supervision;standing Pt Will Perform Upper Body Bathing: sitting;with modified independence Pt Will Perform Lower Body Bathing: with supervision;sit to/from stand Pt Will Transfer to Toilet: with supervision;ambulating;regular height toilet Additional ADL Goal #1: Pt will independently verbalize 3 energy conservation strategies.  OT Frequency: Min 2X/week   Barriers to D/C: Decreased caregiver support          Co-evaluation PT/OT/SLP Co-Evaluation/Treatment: Yes Reason for Co-Treatment: For patient/therapist safety   OT goals addressed during session: ADL's and self-care      End of Session Nurse Communication: Mobility status  Activity Tolerance: Patient limited by fatigue Patient left: in bed;with call bell/phone within reach;with bed alarm set   Time: [id number] OT Time Calculation (min): 15 min Charges:  OT General Charges $OT Visit: 1 Procedure OT Evaluation $OT Eval Moderate Complexity:  1 Procedure G-Codes:     Binnie Kand M.S., OTR/L Pager: [phone]  03/04/2016, 5:15 PM

## 2016-03-04 NOTE — Anesthesia Preprocedure Evaluation (Addendum)
Anesthesia Evaluation  Patient identified by MRN, date of birth, ID band Patient awake    Reviewed: Allergy & Precautions, NPO status , Patient's Chart, lab work & pertinent test results  Airway Mallampati: II  TM Distance: >3 FB Neck ROM: Full    Dental no notable dental hx.    Pulmonary sleep apnea , COPD, former smoker,    Pulmonary exam normal breath sounds clear to auscultation       Cardiovascular hypertension, + CAD, + Past MI and + Cardiac Stents  Normal cardiovascular exam Rhythm:Regular Rate:Normal  LVAD   Neuro/Psych MS negative psych ROS   GI/Hepatic Neg liver ROS, GERD  Medicated,  Endo/Other  Hypothyroidism   Renal/GU Renal InsufficiencyRenal disease  negative genitourinary   Musculoskeletal negative musculoskeletal ROS (+)   Abdominal   Peds negative pediatric ROS (+)  Hematology  (+) anemia ,   Anesthesia Other Findings   Reproductive/Obstetrics negative OB ROS                           Anesthesia Physical Anesthesia Plan  ASA: III  Anesthesia Plan: MAC   Post-op Pain Management:    Induction: Intravenous  Airway Management Planned: Nasal Cannula  Additional Equipment:   Intra-op Plan:   Post-operative Plan:   Informed Consent: I have reviewed the patients History and Physical, chart, labs and discussed the procedure including the risks, benefits and alternatives for the proposed anesthesia with the patient or authorized representative who has indicated his/her understanding and acceptance.   Dental advisory given  Plan Discussed with: CRNA and Surgeon  Anesthesia Plan Comments:       Anesthesia Quick Evaluation

## 2016-03-04 NOTE — Progress Notes (Signed)
Patient lying in bed. Addressed his constipation. He did not want anything at this time. Advised him that we have prune juice available on the floor. Will let us know if he wants anything, wants it to "happen naturally." no other needs at this time. Call light within reach

## 2016-03-04 NOTE — Care Management Note (Addendum)
Case Management Note  Patient Details  Name: Dale Shields MRN: [id number] Date of Birth: 14-Sep-1950  Subjective/Objective:          Pt admitted with abdominal Pain          Action/Plan:  CM assessed pt; pt immediately informed CM that he was not able to go back home by himself due to instability with mobility and resp - pt stated he informed [facility] yesterday and was told that CSW would consult.  Pt states he is from home alone was almost sedentary prior to admit and this was not his baseline.  CM requested PT/OT eval and informed bedside nurse of complaint from pt - bedside nurse to follow up with attending - requested that pt not be discharged prior to PT eval and recommendation arrangements.  CSW tentatively consulted .  CM will continue to follow for discharge needs  Expected Discharge Date:                  Expected Discharge Plan:     In-House Referral:  Clinical Social Work  Discharge planning Services  CM Consult  Post Acute Care Choice:    Choice offered to:     DME Arranged:    DME Agency:     HH Arranged:    [place] Agency:     Status of Service:  In process, will continue to follow  If discussed at Long Length of Stay Meetings, dates discussed:    Additional Comments:  Maryclare Labrador, RN 03/04/2016, 10:55 AM

## 2016-03-04 NOTE — Op Note (Signed)
[facility] Patient Name: Dale Shields Procedure Date : 03/04/2016 MRN: [id number] Attending MD: Carlota Raspberry. Jaci Desanto MD, MD Date of Birth: 01/20/1951 CSN: [id number] Age: 65 Admit Type: Inpatient Procedure:                Upper GI endoscopy Indications:              Suspected upper gastrointestinal bleeding, abnormal                            CT abdomen, epigastric pain Providers:                Carlota Raspberry. Jessye Imhoff MD, MD, Dortha Schwalbe RN,                            RN, Elspeth Cho Tech., Technician, Pearline Cables,                            CRNA Referring MD:              Medicines:                Monitored Anesthesia Care Complications:            No immediate complications. Estimated blood loss:                            Minimal. Estimated Blood Loss:     Estimated blood loss was minimal. Procedure:                Pre-Anesthesia Assessment:                           - Prior to the procedure, a History and Physical                            was performed, and patient medications and                            allergies were reviewed. The patient's tolerance of                            previous anesthesia was also reviewed. The risks                            and benefits of the procedure and the sedation                            options and risks were discussed with the patient.                            All questions were answered, and informed consent                            was obtained. Prior Anticoagulants: The patient has                            taken no  previous anticoagulant or antiplatelet                            agents. ASA Grade Assessment: III - A patient with                            severe systemic disease. After reviewing the risks                            and benefits, the patient was deemed in                            satisfactory condition to undergo the procedure.                           After obtaining informed  consent, the endoscope was                            passed under direct vision. Throughout the                            procedure, the patient's blood pressure, pulse, and                            oxygen saturations were monitored continuously. The                            [id number]) scope was introduced through the                            mouth, and advanced to the second part of duodenum.                            The upper GI endoscopy was accomplished without                            difficulty. The patient tolerated the procedure                            well. Scope In: Scope Out: Findings:      Esophagogastric landmarks were identified: the Z-line was found at 43       cm, the gastroesophageal junction was found at 43 cm and the upper       extent of the gastric folds was found at 43 cm from the incisors.      Mild esophagitis with no bleeding was found at the GEJ.      The exam of the esophagus was otherwise normal.      One non-bleeding cratered gastric ulcer with no stigmata of bleeding was       found at the pylorus. The lesion was 7 mm in largest dimension. Another       superficial linear clean based ulcer was found in the pre-pylorus around       69mm or so in size.      The exam of the stomach was otherwise  normal.      Biopsies were taken with a cold forceps in the gastric body and in the       gastric antrum for Helicobacter pylori testing.      Localized moderate inflammation characterized by congestion (edema),       erythema and friability was found in the duodenal bulb. Biopsies were       taken with a cold forceps for histology.      The exam of the duodenum was otherwise normal. Impression:               - Esophagogastric landmarks identified.                           - Mild esophagitis.                           - Non-bleeding 2 gastric ulcers with no stigmata of                            bleeding.                           - Duodenitis.  Biopsied.                           - Biopsies were taken with a cold forceps for                            Helicobacter pylori testing. Moderate Sedation:      No moderate sedation, case performed with MAC Recommendation:           - Return patient to hospital ward for ongoing care.                           - Resume previous diet.                           - Continue present medications.                           - Continue protonix 40mg  twice daily for one month,                            then once daily                           - Add Carafate 10 po q 6 hours PRN pain                           - Await pathology results, treat for H pylori if                            positive                           - NO NSAIDs                           -  Repeat upper endoscopy in 2-3 months to evaluate                            the response to therapy, ensure healing of ulcer.                           - Patient stable for discharge home today from GI                            perspective Procedure Code(s):        --- Professional ---                           [phone], Esophagogastroduodenoscopy, flexible,                            transoral; with biopsy, single or multiple Diagnosis Code(s):        --- Professional ---                           K21.0, Gastro-esophageal reflux disease with                            esophagitis                           K25.9, Gastric ulcer, unspecified as acute or                            chronic, without hemorrhage or perforation                           K29.80, Duodenitis without bleeding CPT copyright 2016 American Medical [employer]. All rights reserved. The codes documented in this report are preliminary and upon coder review may  be revised to meet current compliance requirements. Remo Lipps P. Adrinne Sze MD, MD 03/04/2016 9:35:01 AM This report has been signed electronically. Number of Addenda: 0

## 2016-03-04 NOTE — Transfer of Care (Signed)
Immediate Anesthesia Transfer of Care Note  Patient: CANON WYKLE  Procedure(s) Performed: Procedure(s): ESOPHAGOGASTRODUODENOSCOPY (EGD) (N/A)  Patient Location: Endoscopy Unit  Anesthesia Type:MAC  Level of Consciousness: awake, alert , oriented and patient cooperative  Airway & Oxygen Therapy: Patient Spontanous Breathing and Patient connected to nasal cannula oxygen  Post-op Assessment: Report given to RN, Post -op Vital signs reviewed and stable and Patient moving all extremities X 4  Post vital signs: Reviewed and stable  Last Vitals:  Vitals:   03/04/16 0828 03/04/16 0928  BP: (!) 177/92 140/84  Pulse: 73 78  Resp: 16 (!) 0  Temp: 36.4 C     Last Pain:  Vitals:   03/04/16 0928  TempSrc: Oral  PainSc:       Patients Stated Pain Goal: 3 ([id number])  Complications: No apparent anesthesia complications

## 2016-03-04 NOTE — Evaluation (Signed)
Physical Therapy Evaluation Patient Details Name: Dale Shields MRN: [id number] DOB: 11-23-1950 Today's Date: 03/04/2016   History of Present Illness  65 y.o. male, with a history of COPD, CAD s/p 3 stent placements, GERD, HTN, hypothyroidism, CHF, MS and neurogenic bladder with chronic indwelling foley, presents with complaints of exertional shortness of breath, epigastric pain, and exertional Left-sided dull chest pain in his left shoulder. He has associated generalized  weakness.   Clinical Impression  Patient demonstrates deficits in functional mobility as indicated below. Will need continued skilled PT to address deficits and maximize function. Will see as indicated and progress as tolerated. Patient with extremely limited activity tolerance and noted anxiety with mobility. Concerned re: home setup and living arrangements. Highly recommend SNF upon acute discharge.    Follow Up Recommendations SNF;Supervision/Assistance - 24 hour    Equipment Recommendations  None recommended by PT    Recommendations for Other Services       Precautions / Restrictions Precautions Precautions: Fall Precaution Comments: anxiety? Restrictions Weight Bearing Restrictions: No      Mobility  Bed Mobility Overal bed mobility: Independent             General bed mobility comments: no physical assist required  Transfers Overall transfer level: Needs assistance Equipment used: None Transfers: Sit to/from Stand Sit to Stand: Supervision         General transfer comment: supervision for safety, no physical assist required  Ambulation/Gait Ambulation/Gait assistance: Supervision Ambulation Distance (Feet): 40 Feet Assistive device:  (UE support holding IV pole) Gait Pattern/deviations: Step-through pattern;Shuffle Gait velocity: decreased Gait velocity interpretation: <1.8 ft/sec, indicative of risk for recurrent falls General Gait Details: patient reports BLE weakness and 4/4 DOE  with very minimal activity (O2 saturations dropped to 88% with activity on room air via pulse Ox but returned to >96% with minimal rest.)   Stairs            Wheelchair Mobility    Modified Rankin (Stroke Patients Only)       Balance Overall balance assessment: Needs assistance Sitting-balance support: Feet supported Sitting balance-Leahy Scale: Good     Standing balance support: During functional activity;Single extremity supported Standing balance-Leahy Scale: Poor Standing balance comment: relaince on UE support, but upon fatigue began to become unsteady with mobility                             Pertinent Vitals/Pain Pain Assessment: Faces Faces Pain Scale: No hurt    Home Living Family/patient expects to be discharged to:: Private residence Living Arrangements: Alone   Type of Home: House [place]) Home Access: Ramped entrance     Home Layout: One level Home Equipment: Wheelchair - power;Walker - 2 wheels;Cane - single point;Shower seat;Grab bars - tub/shower      Prior Function Level of Independence: Needs assistance   Gait / Transfers Assistance Needed: Pt reports he gets very SOB with short distance mobility.  ADL's / Homemaking Assistance Needed: Gets a ride to his sisters house for showers. Gets SOB with dressing and minimal activity.        Hand Dominance   Dominant Hand: Right    Extremity/Trunk Assessment   Upper Extremity Assessment: Overall WFL for tasks assessed           Lower Extremity Assessment: Overall WFL for tasks assessed (Subjectively reports weakness however strength intact)      Cervical / Trunk  Assessment: Normal  Communication   Communication: No difficulties  Cognition Arousal/Alertness: Awake/alert Behavior During Therapy: Anxious Overall Cognitive Status: No family/caregiver present to determine baseline cognitive functioning                      General Comments       Exercises     Assessment/Plan    PT Assessment Patient needs continued PT services  PT Problem List Decreased activity tolerance;Decreased balance;Decreased mobility;Cardiopulmonary status limiting activity          PT Treatment Interventions DME instruction;Gait training;Functional mobility training;Therapeutic activities;Therapeutic exercise;Balance training;Patient/family education    PT Goals (Current goals can be found in the Care Plan section)  Acute Rehab PT Goals Patient Stated Goal: rehab before home PT Goal Formulation: With patient Time For Goal Achievement: 03/18/16 Potential to Achieve Goals: Good    Frequency Min 2X/week   Barriers to discharge Inaccessible home environment;Decreased caregiver support      Co-evaluation PT/OT/SLP Co-Evaluation/Treatment: Yes Reason for Co-Treatment: For patient/therapist safety PT goals addressed during session: Mobility/safety with mobility OT goals addressed during session: ADL's and self-care       End of Session   Activity Tolerance: Patient limited by fatigue Patient left: in bed;with call bell/phone within reach;with bed alarm set Nurse Communication: Mobility status         Time: [id number] PT Time Calculation (min) (ACUTE ONLY): 19 min   Charges:   PT Evaluation $PT Eval Moderate Complexity: 1 Procedure     PT G Codes:        Duncan Dull March 11, 2016, 5:27 PM Alben Deeds, [place] DPT  [phone]

## 2016-03-04 NOTE — Discharge Summary (Addendum)
Physician Discharge Summary  YASSINE BRUNSMAN MRN: [id number] DOB/AGE: 04-15-1950 65 y.o.  PCP: Monico Blitz, MD   Admit date: 03/01/2016 Discharge date: 03/04/2016  Discharge Diagnoses:    Principal Problem:   SOB (shortness of breath) Active Problems:   Multiple sclerosis (HCC)   COPD (chronic obstructive pulmonary disease) (HCC)   CAD (coronary artery disease)   Hematuria   Generalized weakness   FTT (failure to thrive) in adult   Anemia of chronic disease   Depression   Anemia   NSTEMI (non-ST elevated myocardial infarction) ([place])   Gastric ulcer with hemorrhage Escherichia coli UTI  Addendum-patient not discharge due to placement needs  Follow-up recommendations Follow-up with PCP in 3-5 days , including all  additional recommended appointments as below Follow-up CBC, CMP in 3-5 days PCP to continue follow-up on the results of H. pylori testing Repeat EGD in 2-3 months to reevaluate response to therapy, to ensure ulcer healing      Current Discharge Medication List    START taking these medications   Details  pantoprazole (PROTONIX) 40 MG tablet Take 1 tablet (40 mg total) by mouth 2 (two) times daily. Qty: 60 tablet, Refills: 1    sucralfate (CARAFATE) 1 g tablet Take 1 tablet (1 g total) by mouth 4 (four) times daily -  with meals and at bedtime. Qty: 90 tablet, Refills: 1      CONTINUE these medications which have NOT CHANGED   Details  albuterol (PROVENTIL) (2.5 MG/3ML) 0.083% nebulizer solution Take 2.5 mg by nebulization every 6 (six) hours as needed for wheezing or shortness of breath.    aspirin 81 MG chewable tablet Chew 81 mg by mouth daily.     atorvastatin (LIPITOR) 10 MG tablet Take 10 mg by mouth daily.    b complex vitamins tablet Take 1 tablet by mouth daily.    carvedilol (COREG) 6.25 MG tablet Take 6.25 mg by mouth 2 (two) times daily.    DULoxetine (CYMBALTA) 60 MG capsule TAKE (1) CAPSULE BY MOUTH TWICE DAILY. Qty: 60 capsule,  Refills: 2    Fluticasone-Salmeterol (ADVAIR) 250-50 MCG/DOSE AEPB Inhale 1 puff into the lungs 2 (two) times daily as needed.     hydrOXYzine (ATARAX/VISTARIL) 25 MG tablet Take 1 tablet (25 mg total) by mouth 3 (three) times daily as needed. Qty: 90 tablet, Refills: 2    levalbuterol (XOPENEX HFA) 45 MCG/ACT inhaler Inhale 2 puffs into the lungs every 4 (four) hours as needed for wheezing.    Multiple Vitamins-Minerals (THEREMS-M) TABS Take 1 tablet by mouth daily.    nitroGLYCERIN (NITROSTAT) 0.3 MG SL tablet Place 0.4 mg under the tongue every 5 (five) minutes as needed for chest pain.    NUVIGIL 250 MG tablet TAKE 1 TABLET BY MOUTH TWICE DAILY. Qty: 60 tablet, Refills: 0    omeprazole (PRILOSEC) 20 MG capsule Take 1 capsule (20 mg total) by mouth daily. Qty: 60 capsule, Refills: 0    Thiamine HCl (VITAMIN B-1 PO) Take 1 tablet by mouth daily.    traZODone (DESYREL) 50 MG tablet Take 1 tablet by mouth at bedtime as needed for sleep.    VITAMIN A PO Take 1 tablet by mouth daily.    vitamin C (ASCORBIC ACID) 500 MG tablet Take 1,000 mg by mouth 2 (two) times daily.     VITAMIN E PO Take 1 tablet by mouth daily.    ARIPiprazole (ABILIFY) 2 MG tablet Take 1 tablet (2 mg total) by mouth daily. Qty: 30  tablet, Refills: 2    ondansetron (ZOFRAN ODT) 8 MG disintegrating tablet Take 1 tablet (8 mg total) by mouth every 8 (eight) hours as needed for nausea or vomiting. Qty: 10 tablet, Refills: 0  Augmentin 875 mg by mouth daily for 7 days        Discharge Condition: Stable   Discharge Instructions Get Medicines reviewed and adjusted: Please take all your medications with you for your next visit with your Primary MD  Please request your Primary MD to go over all hospital tests and procedure/radiological results at the follow up, please ask your Primary MD to get all Hospital records sent to his/her office.  If you experience worsening of your admission symptoms, develop  shortness of breath, life threatening emergency, suicidal or homicidal thoughts you must seek medical attention immediately by calling 911 or calling your MD immediately if symptoms less severe.  You must read complete instructions/literature along with all the possible adverse reactions/side effects for all the Medicines you take and that have been prescribed to you. Take any new Medicines after you have completely understood and accpet all the possible adverse reactions/side effects.   Do not drive when taking Pain medications.   Do not take more than prescribed Pain, Sleep and Anxiety Medications  Special Instructions: If you have smoked or chewed Tobacco in the last 2 yrs please stop smoking, stop any regular Alcohol and or any Recreational drug use.  Wear Seat belts while driving.  Please note  You were cared for by a hospitalist during your hospital stay. Once you are discharged, your primary care physician will handle any further medical issues. Please note that NO REFILLS for any discharge medications will be authorized once you are discharged, as it is imperative that you return to your primary care physician (or establish a relationship with a primary care physician if you do not have one) for your aftercare needs so that they can reassess your need for medications and monitor your lab values.  Discharge Instructions    Diet - low sodium heart healthy    Complete by:  As directed    Increase activity slowly    Complete by:  As directed        Allergies  Allergen Reactions  . Ultram [Tramadol Hcl] Other (See Comments)    Seizures; "grand mal; twice"  . Gabapentin Other (See Comments)    Made pt dilusional  . Lyrica [Pregabalin] Other (See Comments)    Leg swelling.      Disposition: 01-Home or Self Care   Consults:  GI Cardiology     Significant Diagnostic Studies:  Dg Chest 1 View  Result Date: 03/01/2016 CLINICAL DATA:  Nodular density projecting over  right lower lung on previous x-ray. Repeat chest x-ray with nipple markers. EXAM: CHEST 1 VIEW COMPARISON:  Chest x-ray earlier today. FINDINGS: Repeat film with bilateral nipple markers no longer demonstrates any concerning nodular densities bilaterally. There is no evidence of pulmonary edema, consolidation, pneumothorax, nodule or pleural fluid. IMPRESSION: No further suspicious nodular densities identified, particularly overlying the right lower lung. Electronically Signed   By: Aletta Edouard M.D.   On: 03/01/2016 13:48   Dg Chest 2 View  Result Date: 03/01/2016 CLINICAL DATA:  Shortness of breath.  COPD. EXAM: CHEST  2 VIEW COMPARISON:  01/11/2016, 04/06/2015, 11/15/2014 FINDINGS: The heart and mediastinal contours are within normal limits for size and stable. Mild atherosclerotic calcifications seen in the thoracic aortic arch. The lungs demonstrate emphysematous changes. There  is a vague 7 mm nodular density projecting over the right lung base and an anterior rib on the frontal view, not seen on prior chest radiographs. Otherwise, the lungs are clear. No airspace disease, effusion, or pneumothorax identified. IMPRESSION: COPD. Vague 7 mm nodular density seen at the right lung base on the frontal view of the chest. Consider repeat frontal view of the chest with nipple markers in place. Electronically Signed   By: Curlene Dolphin M.D.   On: 03/01/2016 11:40   Nm Pulmonary Perf And Vent  Result Date: 03/02/2016 CLINICAL DATA:  Shortness of Breath EXAM: NUCLEAR MEDICINE VENTILATION - PERFUSION LUNG SCAN VIEWS: Anterior, posterior, left lateral, right lateral, RPO, LPO, RAO, LAO -ventilation and perfusion RADIOPHARMACEUTICALS:  32.0 mCi Technetium-54mDTPA aerosol inhalation and 4.3 mCi Technetium-941mAA IV COMPARISON:  Chest radiograph March 01, 2016 FINDINGS: Ventilation: There are scattered subsegmental ventilation defects bilaterally. There is decreased ventilation overall throughout much of the  right lower lobe compared to other areas. Perfusion: There are matching subsegmental defects with the ventilation study. Decreased uptake throughout much of the right lower lobe matches the corresponding ventilation defect. There are no appreciable ventilation/perfusion mismatches. IMPRESSION: No appreciable ventilation/perfusion mismatch. There is a moderate matching ventilation and perfusion defect in the right lower lobe. This study constitutes an overall low probability of pulmonary embolus by PIOPED II criteria. Electronically Signed   By: WiLowella GripII M.D.   On: 03/02/2016 14:50        Filed Weights   03/02/16 0621 03/03/16 0600 03/04/16 0528  Weight: 85.7 kg (189 lb) 86.1 kg (189 lb 12.8 oz) 86 kg (189 lb 8 oz)     Microbiology: Recent Results (from the past 240 hour(s))  Urine culture     Status: Abnormal   Collection Time: 03/01/16 10:11 AM  Result Value Ref Range Status   Specimen Description URINE, CLEAN CATCH  Final   Special Requests NONE  Final   Culture >=100,000 COLONIES/mL ESCHERICHIA COLI (A)  Final   Report Status 03/04/2016 FINAL  Final   Organism ID, Bacteria ESCHERICHIA COLI (A)  Final      Susceptibility   Escherichia coli - MIC*    AMPICILLIN <=2 SENSITIVE Sensitive     CEFAZOLIN <=4 SENSITIVE Sensitive     CEFTRIAXONE <=1 SENSITIVE Sensitive     CIPROFLOXACIN <=0.25 SENSITIVE Sensitive     GENTAMICIN <=1 SENSITIVE Sensitive     IMIPENEM <=0.25 SENSITIVE Sensitive     NITROFURANTOIN <=16 SENSITIVE Sensitive     TRIMETH/SULFA <=20 SENSITIVE Sensitive     AMPICILLIN/SULBACTAM <=2 SENSITIVE Sensitive     PIP/TAZO <=4 SENSITIVE Sensitive     Extended ESBL NEGATIVE Sensitive     * >=100,000 COLONIES/mL ESCHERICHIA COLI  MRSA PCR Screening     Status: Abnormal   Collection Time: 03/02/16  4:51 AM  Result Value Ref Range Status   MRSA by PCR POSITIVE (A) NEGATIVE Final    Comment:        The GeneXpert MRSA Assay (FDA approved for NASAL  specimens only), is one component of a comprehensive MRSA colonization surveillance program. It is not intended to diagnose MRSA infection nor to guide or monitor treatment for MRSA infections. RESULT CALLED TO, READ BACK BY AND VERIFIED WITH: L. RICHARDSON [id number] N. MORRIS        Blood Culture    Component Value Date/Time   SDES URINE, CLEAN CATCH 03/01/2016 1011   SPECREQUEST NONE 03/01/2016 1011   CULT >=100,000  COLONIES/mL ESCHERICHIA COLI (A) 03/01/2016 1011   REPTSTATUS 03/04/2016 FINAL 03/01/2016 1011      Labs: Results for orders placed or performed during the hospital encounter of 03/01/16 (from the past 48 hour(s))  CBC     Status: Abnormal   Collection Time: 03/03/16  2:23 AM  Result Value Ref Range   WBC 9.2 4.0 - 10.5 K/uL   RBC 3.21 (L) 4.22 - 5.81 MIL/uL   Hemoglobin 8.8 (L) 13.0 - 17.0 g/dL   HCT 28.4 (L) 39.0 - 52.0 %   MCV 88.5 78.0 - 100.0 fL   MCH 27.4 26.0 - 34.0 pg   MCHC 31.0 30.0 - 36.0 g/dL   RDW 14.9 11.5 - 15.5 %   Platelets 303 150 - 400 K/uL  Comprehensive metabolic panel     Status: Abnormal   Collection Time: 03/03/16  2:23 AM  Result Value Ref Range   Sodium 141 135 - 145 mmol/L   Potassium 3.9 3.5 - 5.1 mmol/L   Chloride 108 101 - 111 mmol/L   CO2 28 22 - 32 mmol/L   Glucose, Bld 111 (H) 65 - 99 mg/dL   BUN 11 6 - 20 mg/dL   Creatinine, Ser 1.34 (H) 0.61 - 1.24 mg/dL   Calcium 8.5 (L) 8.9 - 10.3 mg/dL   Total Protein 5.7 (L) 6.5 - 8.1 g/dL   Albumin 2.7 (L) 3.5 - 5.0 g/dL   AST 15 15 - 41 U/L   ALT 10 (L) 17 - 63 U/L   Alkaline Phosphatase 80 38 - 126 U/L   Total Bilirubin 0.3 0.3 - 1.2 mg/dL   GFR calc non Af Amer [place] (L) >60 mL/min   GFR calc Af Amer >60 >60 mL/min    Comment: (NOTE) The eGFR has been calculated using the CKD EPI equation. This calculation has not been validated in all clinical situations. eGFR's persistently <60 mL/min signify possible Chronic Kidney Disease.    Anion gap 5 5 - 15     Lipid  Panel     Component Value Date/Time   CHOL 111 09/29/2010 1814   TRIG 130 09/29/2010 1814   HDL 17 (L) 09/29/2010 1814   CHOLHDL 6.5 09/29/2010 1814   VLDL 26 09/29/2010 1814   LDLCALC 68 09/29/2010 1814     Lab Results  Component Value Date   HGBA1C  08/29/2009    5.6 (NOTE)                                                                       According to the ADA Clinical Practice Recommendations for 2011, when HbA1c is used as a screening test:   >=6.5%   Diagnostic of Diabetes Mellitus           (if abnormal result  is confirmed)  5.7-6.4%   Increased risk of developing Diabetes Mellitus  References:Diagnosis and Classification of Diabetes Mellitus,Diabetes ZOXW,9604,Dale(UJWJX 1):S62-S69 and Standards of Medical Care in         Diabetes - 2011,Diabetes BJYN,8295,62  (Suppl 1):S11-S61.     Lab Results  Component Value Date   LDLCALC 68 09/29/2010   CREATININE 1.34 (H) 03/03/2016     HPI   Dale Shields,with a history of COPD, CAD s/p  3 stent placements, GERD, HTN, hypothyroidism, CHF, MS and neurogenic bladder with chronic indwelling foley, presents with complaints of exertional shortness of breath, epigastric pain, and exertional Left-sided dull chest pain in his left shoulder. He has associated generalized weakness. While in the ED, EKG revealed flipped lateral leads, mildly +ve Trop. Hemoccult was negative.   Asian currently is relatively symptom-free he denies any headache, no fever chills, currently chest pain-free, does have a mild epigastric ache which is constant associated with mild nausea, denies any emesis, denies noticing any blood in stool, no black colored stool. He is getting 1 unit of packed RBC transfused in the ER, IV PPI, I have already consulted Dr. Einar Gip his cardiologist and Velora Heckler GI PA sera Trixie Deis, patient will be transferred to [facility] for further care.  HOSPITAL COURSE:   1. Anemia-hemoglobin drop from baseline of 13-8.3 between 10/17 and 11/21?  Reason for anemia unclear, suspected upper GI bleed based on dyspepsia/epigastric pain.  caused from aspirin intake.CT 10/17  Showed Gastric antral/ pyloric wall thickening which could reflect severe gastritis, ulcer, or mass ; correlation with upper endoscopy recommended to exclude gastric neoplasm.    Patient has epigastric pain, mild nausea, status post  1 untis of PRBCs. Hemoglobin now stable around 8.8. Initially placed on   IV PPI now switched to oral Protonix along with Carafate 3 times a day. [facility] has been consulted for EGD, EGD 11/24  showed mild esophagitis, nonbleeding gastric ulcer, duodenitis, H. pylori testing pending, repeat EGD recommended in 2-3 months also healing.     2. Epigastric pain, likely secondary to GI etiology, doubt NSTEMI. Troponin flat, severe anemia-keep hemoglobin greater than 8.0, repeat troponin of 0.05. EKG shows flipped T-waves in the lateral leads. He has exertional chest pain but currently chest pain-free. Consult Cardiologist Dr. Einar Gip if  needed. Currently will have to hold aspirin due to #1 but resumed prior to discharge.   3.Acute anemia on top of Anemia of chronic disease. Due to #1 above, Hemoglobin has dropped from 13 to 8.3 in the last month. Status post transfusion of 1 units of PRBCs. LDH, vitamin B-12, INR, reticulocyte count within normal limits  4.. Chronic diastolic CHF. EF 60% on echocardiogram done 1 year ago, Patient presents with exertional shortness of breath which is due to demand ischemia from #1 and #2 above, no rales on exam, no peripheral edema, his clinically compensated from CHF standpoint,  shortness of breath likely secondary to anemia   5. CAD.  native vessel with history of proximal LAD stent with a 3.5 x 12 mm Xience DES. Diagonal 1 branch of LAD scoring balloon angioplasty. Distal RCA scoring balloon angioplasty on 11/22/2011. Last stress test in my office on 11/03/2014 was nonischemic.  Marland Kitchen Continue on  coreg and hold aspirin for now. Seen by Adrian Prows, MD  6. COPD. Stable.   7. Hx of PE. Abnormal d-dimer, Patient is not on any coagulation at the time.Although d-dimer is borderline high but this is due to anemia and possible ongoing upper GI bleed,  VQ scan/venous Doppler negative for DVT/PE  8. HTN. Pressures are stablecontinue Coreg.  9. Hypothyroidism. Continue synthroid.  10.CK D stage III. Baseline creatinine close to 1.3. Monitor.  12.MS with chronic generalized weakness, neurogenic bladder with indwelling Foley. 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 rate (!) 21, height 5' 9" (1.753 m), weight 86 kg (189 lb 8 oz), SpO2 100 %.  Cardiovascular system: S1 & S2 heard, RRR. No JVD, murmurs, rubs, gallops or clicks. No pedal edema. Gastrointestinal system: Abdomen is nondistended, soft and nontender. No organomegaly or masses felt. Normal bowel sounds heard. Central nervous system: Alert and oriented. No focal neurological deficits. Extremities: Symmetric 5 x 5 power. Skin: No rashes, lesions or ulcers Psychiatry: Judgement and insight appear normal. Mood & affect appropriate.     Follow-up Information    SHAH,ASHISH, MD. Schedule an appointment as soon as possible for a visit in 2 day(s).   Specialty:  Internal Medicine Why:  Call to make appointment, hospital follow-up, CBC in 2-3 days Contact information: [place] [address] [phone]        Manus Gunning, MD. Call in 2 day(s).   Specialty:  Gastroenterology Why:  Follow-up with GI in 2-3 weeks and schedule for repeat EGD in 2-3 months Contact information: [address]  [address] [place] [address] [phone]           Signed: Reyne Dumas 03/04/2016, 9:43 AM        Time spent >45 mins

## 2016-03-04 NOTE — Interval H&P Note (Signed)
History and Physical Interval Note:  03/04/2016 8:40 AM  Dale Shields  has presented today for surgery, with the diagnosis of anemia, burning upper abdominal pain  The various methods of treatment have been discussed with the patient and family. After consideration of risks, benefits and other options for treatment, the patient has consented to  Procedure(s): ESOPHAGOGASTRODUODENOSCOPY (EGD) (N/A) as a surgical intervention .  The patient's history has been reviewed, patient examined, no change in status, stable for surgery.  I have reviewed the patient's chart and labs.  Questions were answered to the patient's satisfaction.     [name]

## 2016-03-05 DIAGNOSIS — J42 Unspecified chronic bronchitis: Secondary | ICD-10-CM

## 2016-03-05 LAB — CBC
HEMATOCRIT: 32.9 % — AB (ref 39.0–52.0)
Hemoglobin: 10.1 g/dL — ABNORMAL LOW (ref 13.0–17.0)
MCH: 27.4 pg (ref 26.0–34.0)
MCHC: 30.7 g/dL (ref 30.0–36.0)
MCV: 89.2 fL (ref 78.0–100.0)
PLATELETS: 361 10*3/uL (ref 150–400)
RBC: 3.69 MIL/uL — ABNORMAL LOW (ref 4.22–5.81)
RDW: 14.7 % (ref 11.5–15.5)
WBC: 8.8 10*3/uL (ref 4.0–10.5)

## 2016-03-05 MED ORDER — TRAMADOL HCL 50 MG PO TABS: 50.0000 mg | ORAL_TABLET | Freq: Four times a day (QID) | ORAL | Status: DC | PRN

## 2016-03-05 MED ORDER — MORPHINE SULFATE 15 MG PO TABS
15.0000 mg | ORAL_TABLET | Freq: Four times a day (QID) | ORAL | Status: DC | PRN
  Administered 2016-03-05 (×2): 15 mg via ORAL
  Filled 2016-03-05 (×2): qty 1

## 2016-03-05 MED ORDER — ACETAMINOPHEN 325 MG PO TABS: 650.0000 mg | ORAL_TABLET | Freq: Four times a day (QID) | ORAL | Status: DC | PRN

## 2016-03-05 NOTE — Progress Notes (Signed)
[facility] PROGRESS NOTE  Dale Shields [id number] DOB: Nov 30, 1950 DOA: 03/01/2016   PCP: Monico Blitz, MD     Assessment/Plan: Principal Problem:   SOB (shortness of breath) Active Problems:   Multiple sclerosis (HCC)   COPD (chronic obstructive pulmonary disease) (HCC)   CAD (coronary artery disease)   Hematuria   Generalized weakness   FTT (failure to thrive) in adult   Anemia of chronic disease   Depression   Anemia   NSTEMI (non-ST elevated myocardial infarction) ([place])   Gastric ulcer with hemorrhage   64 y.o. male, with a history of COPD, CAD s/p 3 stent placements, GERD, HTN, hypothyroidism, CHF, MS and neurogenic bladder with chronic indwelling foley, presents with complaints of exertional shortness of breath, epigastric pain, and exertional Left-sided dull chest pain in his left shoulder. He has associated generalized  weakness. While in the ED, EKG revealed flipped lateral leads, mildly +ve Trop. Hemoccult was negative.    currently is relatively symptom-free he denies any headache, no fever chills, currently chest pain-free, does have a mild epigastric ache which is constant associated with mild nausea, denies any emesis, denies noticing any blood in stool, no black colored stool. He is getting 1 unit of packed RBC transfused in the ER, IV PPI, I have already consulted Dr. Einar Gip his cardiologist and Velora Heckler GI PA sera Trixie Deis, patient will be transferred to [facility] for further care.  Assessment and plan 65 y.o.male,with a history of COPD, CAD s/p 3 stent placements, GERD, HTN, hypothyroidism, CHF, MS and neurogenic bladder with chronic indwelling foley, presents with complaints of exertional shortness of breath, epigastric pain, and exertional Left-sided dull chest pain in his left shoulder. He has associated generalized weakness. While in the ED, EKG revealed flipped lateral leads, mildly +ve Trop. Hemoccult was negative.   Asian currently is  relatively symptom-free he denies any headache, no fever chills, currently chest pain-free, does have a mild epigastric ache which is constant associated with mild nausea, denies any emesis, denies noticing any blood in stool, no black colored stool. He is getting 1 unit of packed RBC transfused in the ER, IV PPI, I have already consulted Dr. Einar Gip his cardiologist and Velora Heckler GI PA sera Trixie Deis, patient will be transferred to [facility] for further care.  HOSPITAL COURSE:   1. Anemia-hemoglobin drop from baseline of 13-8.3 between 10/17 and 11/21? Reason for anemia unclear, suspected upper GI bleed based on dyspepsia/epigastric pain. caused from aspirin intake.CT 10/17 Showed Gastric antral/ pyloric wall thickening which could reflect severe gastritis, ulcer, or mass ; correlation with upper endoscopy recommended to exclude gastric neoplasm.   Patient has epigastric pain, mild nausea, status post 1 untis of PRBCs. Hemoglobin now stable around 8.8. Initially placed on   IV PPI now switched to oral Protonix along with Carafate 3 times a day. [facility] has been consulted for EGD, EGD 11/24  showed mild esophagitis, nonbleeding gastric ulcer, duodenitis, H. pylori testing pending, repeat EGD recommended in 2-3 months also healing.     2. Epigastric pain, likely secondary to GI etiology, doubt NSTEMI. Troponin flat, severe anemia-keep hemoglobin greater than 8.0, repeat troponin of 0.05. EKG shows flipped T-waves in the lateral leads. He has exertional chest pain but currently chest pain-free. Consult Cardiologist Dr. Einar Gip if needed. Currently will have to hold aspirin due to #1 but resumed prior to discharge.   3.Acute anemia on top of Anemia of chronic disease. Due to #1 above, Hemoglobin has  dropped from 13 to 8.3 in the last month. Status post transfusion of 1 units of PRBCs. LDH, vitamin B-12, INR, reticulocyte count within normal limits  4.. Chronic diastolic CHF. EF  60% on echocardiogram done 1 year ago, Patient presents with exertional shortness of breath which is due to demand ischemia from #1 and #2 above, no rales on exam, no peripheral edema, his clinically compensated from CHF standpoint,  shortness of breath likely secondary to anemia   5. CAD. native vessel with history of proximal LAD stent with a 3.5 x 12 mm Xience DES. Diagonal 1 branch of LAD scoring balloon angioplasty. Distal RCA scoring balloon angioplasty on 11/22/2011. Last stress test in my office on 11/03/2014 was nonischemic. Marland Kitchen Continue on coreg and hold aspirin for now. Seen by Adrian Prows, MD  6. COPD. Stable.   7. Hx of PE. Abnormal d-dimer, Patient is not on any coagulation at the time.Although d-dimer is borderline high but this is due to anemia and possible ongoing upper GI bleed, VQ scan/venous Doppler negative forDVT/PE  8. HTN. Pressures are stablecontinue Coreg.  9. Hypothyroidism. Continue synthroid.  10.CK D stage III. Baseline creatinine close to 1.3. Monitor.  12.MS with chronic generalized weakness, neurogenic bladder with indwelling Foley. 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 Chronic pain, tearful and requesting pain medicine for generalized pain, previously on chronic narcotics Start MSIR as needed      DVT prophylaxsis SCDs  Code Status:  DO NOT RESUSCITATE     Family Communication: Discussed in detail with the patient, all imaging results, lab results explained to the patient   Disposition Plan:   Anticipate discharge to SNF as soon as bed is available     Consultants:  GI    Procedures:  None  Antibiotics: Anti-infectives    Start     Dose/Rate Route Frequency Ordered Stop   03/04/16 1000  cefTRIAXone (ROCEPHIN)  1 g in dextrose 5 % 50 mL IVPB     1 g 100 mL/hr over 30 Minutes Intravenous Every 24 hours 03/04/16 0951     03/04/16 0000  amoxicillin-clavulanate (AUGMENTIN) 875-125 MG tablet     1 tablet Oral 2 times daily 03/04/16 0945 03/11/16 2359   03/01/16 1330  cefTRIAXone (ROCEPHIN) 1 g in dextrose 5 % 50 mL IVPB     1 g 100 mL/hr over 30 Minutes Intravenous  Once 03/01/16 1326 03/01/16 1459         HPI/Subjective: No further episodes of bleeding overnight, tearful due to generalized pain  Objective: Vitals:   03/04/16 1416 03/04/16 2130 03/05/16 0644 03/05/16 0813  BP: 139/66 129/82 140/86   Pulse: 76 72 78   Resp: 20 20 20    Temp: 98 F (36.7 C) 98.1 F (36.7 C) 98 F (36.7 C)   TempSrc: Oral Oral Oral   SpO2: 96% 96% 98% 95%  Weight:   87.2 kg (192 lb 3.2 oz)   Height:        Intake/Output Summary (Last 24 hours) at 03/05/16 0914 Last data filed at 03/05/16 0831  Gross per 24 hour  Intake              145 ml  Output             3675 ml  Net            -  3530 ml    Exam:  Examination:  General exam: Appears calm and comfortable  Respiratory system: Clear to auscultation. Respiratory effort normal. Cardiovascular system: S1 & S2 heard, RRR. No JVD, murmurs, rubs, gallops or clicks. No pedal edema. Gastrointestinal system: Abdomen is nondistended, soft and nontender. No organomegaly or masses felt. Normal bowel sounds heard. Central nervous system: Alert and oriented. No focal neurological deficits. Extremities: Symmetric 5 x 5 power. Skin: No rashes, lesions or ulcers Psychiatry: Judgement and insight appear normal. Mood & affect appropriate.     Data Reviewed: I have personally reviewed following labs and imaging studies  Micro Results Recent Results (from the past 240 hour(s))  Urine culture     Status: Abnormal   Collection Time: 03/01/16 10:11 AM  Result Value Ref Range Status   Specimen Description URINE, CLEAN CATCH  Final   Special Requests NONE   Final   Culture >=100,000 COLONIES/mL ESCHERICHIA COLI (A)  Final   Report Status 03/04/2016 FINAL  Final   Organism ID, Bacteria ESCHERICHIA COLI (A)  Final      Susceptibility   Escherichia coli - MIC*    AMPICILLIN <=2 SENSITIVE Sensitive     CEFAZOLIN <=4 SENSITIVE Sensitive     CEFTRIAXONE <=1 SENSITIVE Sensitive     CIPROFLOXACIN <=0.25 SENSITIVE Sensitive     GENTAMICIN <=1 SENSITIVE Sensitive     IMIPENEM <=0.25 SENSITIVE Sensitive     NITROFURANTOIN <=16 SENSITIVE Sensitive     TRIMETH/SULFA <=20 SENSITIVE Sensitive     AMPICILLIN/SULBACTAM <=2 SENSITIVE Sensitive     PIP/TAZO <=4 SENSITIVE Sensitive     Extended ESBL NEGATIVE Sensitive     * >=100,000 COLONIES/mL ESCHERICHIA COLI  MRSA PCR Screening     Status: Abnormal   Collection Time: 03/02/16  4:51 AM  Result Value Ref Range Status   MRSA by PCR POSITIVE (A) NEGATIVE Final    Comment:        The GeneXpert MRSA Assay (FDA approved for NASAL specimens only), is one component of a comprehensive MRSA colonization surveillance program. It is not intended to diagnose MRSA infection nor to guide or monitor treatment for MRSA infections. RESULT CALLED TO, READ BACK BY AND VERIFIED WITH: L. RICHARDSON [id number] 11.22.17 N. MORRIS     Radiology Reports Dg Chest 1 View  Result Date: 03/01/2016 CLINICAL DATA:  Nodular density projecting over right lower lung on previous x-ray. Repeat chest x-ray with nipple markers. EXAM: CHEST 1 VIEW COMPARISON:  Chest x-ray earlier today. FINDINGS: Repeat film with bilateral nipple markers no longer demonstrates any concerning nodular densities bilaterally. There is no evidence of pulmonary edema, consolidation, pneumothorax, nodule or pleural fluid. IMPRESSION: No further suspicious nodular densities identified, particularly overlying the right lower lung. Electronically Signed   By: Aletta Edouard M.D.   On: 03/01/2016 13:48   Dg Chest 2 View  Result Date: 03/01/2016 CLINICAL DATA:   Shortness of breath.  COPD. EXAM: CHEST  2 VIEW COMPARISON:  01/11/2016, 04/06/2015, 11/15/2014 FINDINGS: The heart and mediastinal contours are within normal limits for size and stable. Mild atherosclerotic calcifications seen in the thoracic aortic arch. The lungs demonstrate emphysematous changes. There is a vague 7 mm nodular density projecting over the right lung base and an anterior rib on the frontal view, not seen on prior chest radiographs. Otherwise, the lungs are clear. No airspace disease, effusion, or pneumothorax identified. IMPRESSION: COPD. Vague 7 mm nodular density seen at the right lung base on the frontal view of the  chest. Consider repeat frontal view of the chest with nipple markers in place. Electronically Signed   By: Curlene Dolphin M.D.   On: 03/01/2016 11:40   Nm Pulmonary Perf And Vent  Result Date: 03/02/2016 CLINICAL DATA:  Shortness of Breath EXAM: NUCLEAR MEDICINE VENTILATION - PERFUSION LUNG SCAN VIEWS: Anterior, posterior, left lateral, right lateral, RPO, LPO, RAO, LAO -ventilation and perfusion RADIOPHARMACEUTICALS:  32.0 mCi Technetium-2m DTPA aerosol inhalation and 4.3 mCi Technetium-92m MAA IV COMPARISON:  Chest radiograph March 01, 2016 FINDINGS: Ventilation: There are scattered subsegmental ventilation defects bilaterally. There is decreased ventilation overall throughout much of the right lower lobe compared to other areas. Perfusion: There are matching subsegmental defects with the ventilation study. Decreased uptake throughout much of the right lower lobe matches the corresponding ventilation defect. There are no appreciable ventilation/perfusion mismatches. IMPRESSION: No appreciable ventilation/perfusion mismatch. There is a moderate matching ventilation and perfusion defect in the right lower lobe. This study constitutes an overall low probability of pulmonary embolus by PIOPED II criteria. Electronically Signed   By: Lowella Grip III M.D.   On: 03/02/2016  14:50     CBC  Recent Labs Lab 03/01/16 0929 03/01/16 2208 03/02/16 0400 03/03/16 0223  WBC 11.3*  --  9.3 9.2  HGB 8.3* 8.5* 8.7* 8.8*  HCT 27.4* 27.6* 28.4* 28.4*  PLT 341  --  278 303  MCV 91.6  --  89.6 88.5  MCH 27.8  --  27.4 27.4  MCHC 30.3  --  30.6 31.0  RDW 14.9  --  15.4 14.9    Chemistries   Recent Labs Lab 03/01/16 0929 03/02/16 0400 03/03/16 0223  NA 139 140 141  K 3.7 4.5 3.9  CL 104 108 108  CO2 29 25 28   GLUCOSE 117* 108* 111*  BUN 13 11 11   CREATININE 1.28* 1.42* 1.34*  CALCIUM 8.5* 8.3* 8.5*  AST 19  --  15  ALT 11*  --  10*  ALKPHOS 89  --  80  BILITOT 0.3  --  0.3   ------------------------------------------------------------------------------------------------------------------ estimated creatinine clearance is 60.9 mL/min (by C-G formula based on SCr of 1.34 mg/dL (H)). ------------------------------------------------------------------------------------------------------------------ No results for input(s): HGBA1C in the last 72 hours. ------------------------------------------------------------------------------------------------------------------ No results for input(s): CHOL, HDL, LDLCALC, TRIG, CHOLHDL, LDLDIRECT in the last 72 hours. ------------------------------------------------------------------------------------------------------------------ No results for input(s): TSH, T4TOTAL, T3FREE, THYROIDAB in the last 72 hours.  Invalid input(s): FREET3 ------------------------------------------------------------------------------------------------------------------ No results for input(s): VITAMINB12, FOLATE, FERRITIN, TIBC, IRON, RETICCTPCT in the last 72 hours.  Coagulation profile  Recent Labs Lab 03/02/16 0400  INR 1.01    No results for input(s): DDIMER in the last 72 hours.  Cardiac Enzymes  Recent Labs Lab 03/01/16 1319 03/01/16 1419 03/01/16 1854  TROPONINI 0.05* 0.06* 0.05*    ------------------------------------------------------------------------------------------------------------------ Invalid input(s): POCBNP   CBG: No results for input(s): GLUCAP in the last 168 hours.     Studies: No results found.    Lab Results  Component Value Date   HGBA1C  08/29/2009    5.6 (NOTE)                                                                       According to the ADA Clinical Practice Recommendations for 2011,  when HbA1c is used as a screening test:   >=6.5%   Diagnostic of Diabetes Mellitus           (if abnormal result  is confirmed)  5.7-6.4%   Increased risk of developing Diabetes Mellitus  References:Diagnosis and Classification of Diabetes Mellitus,Diabetes [id number] 1):S62-S69 and Standards of Medical Care in         Diabetes - 2011,Diabetes [id number]  (Suppl 1):S11-S61.   Lab Results  Component Value Date   LDLCALC 68 09/29/2010   CREATININE 1.34 (H) 03/03/2016       Scheduled Meds: . atorvastatin  10 mg Oral Daily  . B-complex with vitamin C  1 tablet Oral Daily  . carvedilol  6.25 mg Oral BID  . cefTRIAXone (ROCEPHIN)  IV  1 g Intravenous Q24H  . Chlorhexidine Gluconate Cloth  6 each Topical Q0600  . DULoxetine  60 mg Oral BID  . furosemide  20 mg Intravenous Once  . mometasone-formoterol  2 puff Inhalation BID  . multivitamin with minerals  1 tablet Oral Daily  . mupirocin ointment  1 application Nasal BID  . nitroGLYCERIN  0.5 inch Topical Q6H  . pantoprazole  40 mg Oral BID  . sodium chloride flush  3 mL Intravenous Q12H  . sucralfate  1 g Oral TID WC & HS  . thiamine  100 mg Oral Daily   Continuous Infusions: . sodium chloride 75 mL/hr at 03/05/16 0858     LOS: 4 days    Time spent: >30 MINS    [facility] Pager [phone]. If 7PM-7AM, please contact night-coverage at [email or web address], password [facility] 03/05/2016, 9:14 AM  LOS: 4 days

## 2016-03-05 NOTE — NC FL2 (Signed)
[place] MEDICAID FL2 LEVEL OF CARE SCREENING TOOL     IDENTIFICATION  Patient Name: Dale Shields Birthdate: [birth year] Sex: male Admission Date (Current Location): 03/01/2016  [facility] and [place] Number:  Herbalist and Address:  [facility], [place] [address] [place], [place] [address]      Provider Number: [id number]  Attending Physician Name and Address:  Reyne Dumas, MD  Relative Name and Phone Number:  Durenda Guthrie [phone]    Current Level of Care: Hospital Recommended Level of Care: [place] Prior Approval Number:    Date Approved/Denied:   PASRR Number: [id number]  Discharge Plan: SNF    Current Diagnoses: Patient Active Problem List   Diagnosis Date Noted  . Gastric ulcer with hemorrhage   . SOB (shortness of breath) 03/01/2016  . Anemia 03/01/2016  . NSTEMI (non-ST elevated myocardial infarction) ([place]) 03/01/2016  . Major depressive disorder, recurrent episode, moderate ([place]) 01/15/2016  . Depression 01/11/2016  . Hyperlipidemia 01/11/2016  . Anxiety 01/11/2016  . Atypical chest pain 04/08/2015  . UTI (urinary tract infection) 04/08/2015  . Chest pain, rule out acute myocardial infarction 04/06/2015  . Chest pain 04/06/2015  . Hydronephrosis 11/18/2014  . Catheter-associated urinary tract infection ([place])   . Malnutrition of moderate degree ([place]) 11/17/2014  . AKI (acute kidney injury) ([place]) 11/16/2014  . Hematuria 11/16/2014  . Generalized weakness 11/16/2014  . FTT (failure to thrive) in adult 11/16/2014  . Acute encephalopathy 11/16/2014  . Anemia of chronic disease 11/16/2014  . COPD (chronic obstructive pulmonary disease) ([place]) 11/15/2014  . CAD (coronary artery disease) 11/15/2014  . Chronic pain syndrome 04/11/2013  . Multiple sclerosis ([place]) 04/11/2013    Orientation RESPIRATION BLADDER Height & Weight     Self, Time, Situation, Place  Normal Continent Weight: 192 lb 3.2 oz  (87.2 kg) Height:  5\' 9"  (175.3 cm)  BEHAVIORAL SYMPTOMS/MOOD NEUROLOGICAL BOWEL NUTRITION STATUS  Other (Comment) (None)   Continent Diet (See DC Summary)  AMBULATORY STATUS COMMUNICATION OF NEEDS Skin   Limited Assist Verbally Normal                       Personal Care Assistance Level of Assistance  Bathing Bathing Assistance: Limited assistance         Functional Limitations Info             SPECIAL CARE FACTORS FREQUENCY  PT (By licensed PT), OT (By licensed OT)     PT Frequency: 5x wk OT Frequency: 5x wk            Contractures Contractures Info: Not present    Additional Factors Info  Code Status, Allergies, Isolation Precautions Code Status Info: DNR Allergies Info: Ultram Tramadol Hcl, Gabapentin, Lyrica Pregabalin     Isolation Precautions Info: MRSA     Current Medications (03/05/2016):  This is the current hospital active medication list Current Facility-Administered Medications  Medication Dose Route Frequency Provider Last Rate Last Dose  . 0.9 %  sodium chloride infusion   Intravenous Continuous Reyne Dumas, MD 75 mL/hr at 03/05/16 0858    . acetaminophen (TYLENOL) tablet 650 mg  650 mg Oral Q6H PRN Reyne Dumas, MD      . albuterol (PROVENTIL) (2.5 MG/3ML) 0.083% nebulizer solution 2.5 mg  2.5 mg Nebulization Q6H PRN Thurnell Lose, MD      . albuterol (PROVENTIL) (2.5 MG/3ML) 0.083% nebulizer solution 2.5 mg  2.5 mg Nebulization Q4H PRN  Thurnell Lose, MD      . atorvastatin (LIPITOR) tablet 10 mg  10 mg Oral Daily Thurnell Lose, MD   10 mg at 03/05/16 1056  . B-complex with vitamin C tablet 1 tablet  1 tablet Oral Daily Thurnell Lose, MD   1 tablet at 03/05/16 1056  . carvedilol (COREG) tablet 6.25 mg  6.25 mg Oral BID Thurnell Lose, MD   6.25 mg at 03/05/16 1056  . cefTRIAXone (ROCEPHIN) 1 g in dextrose 5 % 50 mL IVPB  1 g Intravenous Q24H Reyne Dumas, MD   1 g at 03/05/16 1056  . Chlorhexidine Gluconate Cloth 2 % PADS 6  each  6 each Topical Q0600 Reyne Dumas, MD   6 each at 03/04/16 0800  . diphenhydrAMINE (BENADRYL) injection 25 mg  25 mg Intravenous Q6H PRN Thurnell Lose, MD      . DULoxetine (CYMBALTA) DR capsule 60 mg  60 mg Oral BID Thurnell Lose, MD   60 mg at 03/05/16 1056  . furosemide (LASIX) injection 20 mg  20 mg Intravenous Once Thurnell Lose, MD      . HYDROcodone-acetaminophen (NORCO/VICODIN) 5-325 MG per tablet 1 tablet  1 tablet Oral Q6H PRN Thurnell Lose, MD   1 tablet at 03/05/16 0859  . HYDROmorphone (DILAUDID) injection 1 mg  1 mg Intravenous Q4H PRN Reyne Dumas, MD   1 mg at 03/05/16 1216  . mometasone-formoterol (DULERA) 200-5 MCG/ACT inhaler 2 puff  2 puff Inhalation BID Thurnell Lose, MD   2 puff at 03/05/16 0813  . morphine (MSIR) tablet 15 mg  15 mg Oral Q6H PRN Reyne Dumas, MD   15 mg at 03/05/16 1114  . multivitamin with minerals tablet 1 tablet  1 tablet Oral Daily Thurnell Lose, MD   1 tablet at 03/05/16 1056  . mupirocin ointment (BACTROBAN) 2 % 1 application  1 application Nasal BID Reyne Dumas, MD   1 application at [id number]  . nitroGLYCERIN (NITROGLYN) 2 % ointment 0.5 inch  0.5 inch Topical Q6H Thurnell Lose, MD   0.5 inch at 03/05/16 1215  . nitroGLYCERIN (NITROSTAT) SL tablet 0.4 mg  0.4 mg Sublingual Q5 min PRN Thurnell Lose, MD      . ondansetron (ZOFRAN) tablet 4 mg  4 mg Oral Q6H PRN Thurnell Lose, MD       Or  . ondansetron (ZOFRAN) injection 4 mg  4 mg Intravenous Q6H PRN Thurnell Lose, MD      . pantoprazole (PROTONIX) EC tablet 40 mg  40 mg Oral BID Vena Rua, PA-C   40 mg at 03/05/16 1109  . sodium chloride flush (NS) 0.9 % injection 3 mL  3 mL Intravenous Q12H Thurnell Lose, MD   3 mL at 03/05/16 1057  . sucralfate (CARAFATE) tablet 1 g  1 g Oral TID WC & HS Manus Gunning, MD   1 g at 03/05/16 1215  . technetium albumin aggregated (MAA) injection solution 4 millicurie  4 millicurie Intravenous Once PRN Sherryl Barters, MD      . technetium TC 58M diethylenetriame-pentaacetic acid (DTPA) injection 30 millicurie  30 millicurie Intravenous Once PRN Sherryl Barters, MD      . thiamine (VITAMIN B-1) tablet 100 mg  100 mg Oral Daily Thurnell Lose, MD   100 mg at 03/05/16 1056  . traZODone (DESYREL) tablet 50 mg  50 mg Oral QHS PRN Prashant  Jennette Kettle, MD   50 mg at 03/04/16 2125     Discharge Medications: Please see discharge summary for a list of discharge medications.  Relevant Imaging Results:  Relevant Lab Results:   Additional Information    Alexsus Papadopoulos B, LCSWA

## 2016-03-05 NOTE — Discharge Summary (Signed)
Physician Discharge Summary  SEON KALMUS MRN: [id number] DOB/AGE: 1950/11/03 65 y.o.  PCP: Monico Blitz, MD   Admit date: 03/01/2016 Discharge date: 03/05/2016  Discharge Diagnoses:    Principal Problem:   SOB (shortness of breath) Active Problems:   Multiple sclerosis (HCC)   COPD (chronic obstructive pulmonary disease) (HCC)   CAD (coronary artery disease)   Hematuria   Generalized weakness   FTT (failure to thrive) in adult   Anemia of chronic disease   Depression   Anemia   NSTEMI (non-ST elevated myocardial infarction) ([place])   Gastric ulcer with hemorrhage Escherichia coli UTI  Addendum-patient not discharge due to placement needs  Follow-up recommendations Follow-up with PCP in 3-5 days , including all  additional recommended appointments as below Follow-up CBC, CMP in 3-5 days PCP to continue follow-up on the results of H. pylori testing Repeat EGD in 2-3 months to reevaluate response to therapy, to ensure ulcer healing      Current Discharge Medication List    START taking these medications   Details  pantoprazole (PROTONIX) 40 MG tablet Take 1 tablet (40 mg total) by mouth 2 (two) times daily. Qty: 60 tablet, Refills: 1    sucralfate (CARAFATE) 1 g tablet Take 1 tablet (1 g total) by mouth 4 (four) times daily -  with meals and at bedtime. Qty: 90 tablet, Refills: 1      CONTINUE these medications which have NOT CHANGED   Details  albuterol (PROVENTIL) (2.5 MG/3ML) 0.083% nebulizer solution Take 2.5 mg by nebulization every 6 (six) hours as needed for wheezing or shortness of breath.    aspirin 81 MG chewable tablet Chew 81 mg by mouth daily.     atorvastatin (LIPITOR) 10 MG tablet Take 10 mg by mouth daily.    b complex vitamins tablet Take 1 tablet by mouth daily.    carvedilol (COREG) 6.25 MG tablet Take 6.25 mg by mouth 2 (two) times daily.    DULoxetine (CYMBALTA) 60 MG capsule TAKE (1) CAPSULE BY MOUTH TWICE DAILY. Qty: 60 capsule,  Refills: 2    Fluticasone-Salmeterol (ADVAIR) 250-50 MCG/DOSE AEPB Inhale 1 puff into the lungs 2 (two) times daily as needed.     hydrOXYzine (ATARAX/VISTARIL) 25 MG tablet Take 1 tablet (25 mg total) by mouth 3 (three) times daily as needed. Qty: 90 tablet, Refills: 2    levalbuterol (XOPENEX HFA) 45 MCG/ACT inhaler Inhale 2 puffs into the lungs every 4 (four) hours as needed for wheezing.    Multiple Vitamins-Minerals (THEREMS-M) TABS Take 1 tablet by mouth daily.    nitroGLYCERIN (NITROSTAT) 0.3 MG SL tablet Place 0.4 mg under the tongue every 5 (five) minutes as needed for chest pain.    NUVIGIL 250 MG tablet TAKE 1 TABLET BY MOUTH TWICE DAILY. Qty: 60 tablet, Refills: 0    omeprazole (PRILOSEC) 20 MG capsule Take 1 capsule (20 mg total) by mouth daily. Qty: 60 capsule, Refills: 0    Thiamine HCl (VITAMIN B-1 PO) Take 1 tablet by mouth daily.    traZODone (DESYREL) 50 MG tablet Take 1 tablet by mouth at bedtime as needed for sleep.    VITAMIN A PO Take 1 tablet by mouth daily.    vitamin C (ASCORBIC ACID) 500 MG tablet Take 1,000 mg by mouth 2 (two) times daily.     VITAMIN E PO Take 1 tablet by mouth daily.    ARIPiprazole (ABILIFY) 2 MG tablet Take 1 tablet (2 mg total) by mouth daily. Qty: 30  tablet, Refills: 2    ondansetron (ZOFRAN ODT) 8 MG disintegrating tablet Take 1 tablet (8 mg total) by mouth every 8 (eight) hours as needed for nausea or vomiting. Qty: 10 tablet, Refills: 0  Augmentin 875 mg by mouth daily for 7 days        Discharge Condition: Stable   Discharge Instructions Get Medicines reviewed and adjusted: Please take all your medications with you for your next visit with your Primary MD  Please request your Primary MD to go over all hospital tests and procedure/radiological results at the follow up, please ask your Primary MD to get all Hospital records sent to his/her office.  If you experience worsening of your admission symptoms, develop  shortness of breath, life threatening emergency, suicidal or homicidal thoughts you must seek medical attention immediately by calling 911 or calling your MD immediately if symptoms less severe.  You must read complete instructions/literature along with all the possible adverse reactions/side effects for all the Medicines you take and that have been prescribed to you. Take any new Medicines after you have completely understood and accpet all the possible adverse reactions/side effects.   Do not drive when taking Pain medications.   Do not take more than prescribed Pain, Sleep and Anxiety Medications  Special Instructions: If you have smoked or chewed Tobacco in the last 2 yrs please stop smoking, stop any regular Alcohol and or any Recreational drug use.  Wear Seat belts while driving.  Please note  You were cared for by a hospitalist during your hospital stay. Once you are discharged, your primary care physician will handle any further medical issues. Please note that NO REFILLS for any discharge medications will be authorized once you are discharged, as it is imperative that you return to your primary care physician (or establish a relationship with a primary care physician if you do not have one) for your aftercare needs so that they can reassess your need for medications and monitor your lab values.  Discharge Instructions    Diet - low sodium heart healthy    Complete by:  As directed    Diet - low sodium heart healthy    Complete by:  As directed    Increase activity slowly    Complete by:  As directed    Increase activity slowly    Complete by:  As directed        Allergies  Allergen Reactions  . Ultram [Tramadol Hcl] Other (See Comments)    Seizures; "grand mal; twice"  . Gabapentin Other (See Comments)    Made pt dilusional  . Lyrica [Pregabalin] Other (See Comments)    Leg swelling.      Disposition: 01-Home or Self Care   Consults:   GI Cardiology     Significant Diagnostic Studies:  Dg Chest 1 View  Result Date: 03/01/2016 CLINICAL DATA:  Nodular density projecting over right lower lung on previous x-ray. Repeat chest x-ray with nipple markers. EXAM: CHEST 1 VIEW COMPARISON:  Chest x-ray earlier today. FINDINGS: Repeat film with bilateral nipple markers no longer demonstrates any concerning nodular densities bilaterally. There is no evidence of pulmonary edema, consolidation, pneumothorax, nodule or pleural fluid. IMPRESSION: No further suspicious nodular densities identified, particularly overlying the right lower lung. Electronically Signed   By: Aletta Edouard M.D.   On: 03/01/2016 13:48   Dg Chest 2 View  Result Date: 03/01/2016 CLINICAL DATA:  Shortness of breath.  COPD. EXAM: CHEST  2 VIEW COMPARISON:  01/11/2016,  04/06/2015, 11/15/2014 FINDINGS: The heart and mediastinal contours are within normal limits for size and stable. Mild atherosclerotic calcifications seen in the thoracic aortic arch. The lungs demonstrate emphysematous changes. There is a vague 7 mm nodular density projecting over the right lung base and an anterior rib on the frontal view, not seen on prior chest radiographs. Otherwise, the lungs are clear. No airspace disease, effusion, or pneumothorax identified. IMPRESSION: COPD. Vague 7 mm nodular density seen at the right lung base on the frontal view of the chest. Consider repeat frontal view of the chest with nipple markers in place. Electronically Signed   By: Curlene Dolphin M.D.   On: 03/01/2016 11:40   Nm Pulmonary Perf And Vent  Result Date: 03/02/2016 CLINICAL DATA:  Shortness of Breath EXAM: NUCLEAR MEDICINE VENTILATION - PERFUSION LUNG SCAN VIEWS: Anterior, posterior, left lateral, right lateral, RPO, LPO, RAO, LAO -ventilation and perfusion RADIOPHARMACEUTICALS:  32.0 mCi Technetium-64m DTPA aerosol inhalation and 4.3 mCi Technetium-30m MAA IV COMPARISON:  Chest radiograph March 01, 2016 FINDINGS: Ventilation: There are scattered subsegmental ventilation defects bilaterally. There is decreased ventilation overall throughout much of the right lower lobe compared to other areas. Perfusion: There are matching subsegmental defects with the ventilation study. Decreased uptake throughout much of the right lower lobe matches the corresponding ventilation defect. There are no appreciable ventilation/perfusion mismatches. IMPRESSION: No appreciable ventilation/perfusion mismatch. There is a moderate matching ventilation and perfusion defect in the right lower lobe. This study constitutes an overall low probability of pulmonary embolus by PIOPED II criteria. Electronically Signed   By: Lowella Grip III M.D.   On: 03/02/2016 14:50        Filed Weights   03/03/16 0600 03/04/16 0528 03/05/16 0644  Weight: 86.1 kg (189 lb 12.8 oz) 86 kg (189 lb 8 oz) 87.2 kg (192 lb 3.2 oz)     Microbiology: Recent Results (from the past 240 hour(s))  Urine culture     Status: Abnormal   Collection Time: 03/01/16 10:11 AM  Result Value Ref Range Status   Specimen Description URINE, CLEAN CATCH  Final   Special Requests NONE  Final   Culture >=100,000 COLONIES/mL ESCHERICHIA COLI (A)  Final   Report Status 03/04/2016 FINAL  Final   Organism ID, Bacteria ESCHERICHIA COLI (A)  Final      Susceptibility   Escherichia coli - MIC*    AMPICILLIN <=2 SENSITIVE Sensitive     CEFAZOLIN <=4 SENSITIVE Sensitive     CEFTRIAXONE <=1 SENSITIVE Sensitive     CIPROFLOXACIN <=0.25 SENSITIVE Sensitive     GENTAMICIN <=1 SENSITIVE Sensitive     IMIPENEM <=0.25 SENSITIVE Sensitive     NITROFURANTOIN <=16 SENSITIVE Sensitive     TRIMETH/SULFA <=20 SENSITIVE Sensitive     AMPICILLIN/SULBACTAM <=2 SENSITIVE Sensitive     PIP/TAZO <=4 SENSITIVE Sensitive     Extended ESBL NEGATIVE Sensitive     * >=100,000 COLONIES/mL ESCHERICHIA COLI  MRSA PCR Screening     Status: Abnormal   Collection Time: 03/02/16  4:51  AM  Result Value Ref Range Status   MRSA by PCR POSITIVE (A) NEGATIVE Final    Comment:        The GeneXpert MRSA Assay (FDA approved for NASAL specimens only), is one component of a comprehensive MRSA colonization surveillance program. It is not intended to diagnose MRSA infection nor to guide or monitor treatment for MRSA infections. RESULT CALLED TO, READ BACK BY AND VERIFIED WITH: L. RICHARDSON [id number] [id number] N. MORRIS  Blood Culture    Component Value Date/Time   SDES URINE, CLEAN CATCH 03/01/2016 1011   SPECREQUEST NONE 03/01/2016 1011   CULT >=100,000 COLONIES/mL ESCHERICHIA COLI (A) 03/01/2016 1011   REPTSTATUS 03/04/2016 FINAL 03/01/2016 1011      Labs: Results for orders placed or performed during the hospital encounter of 03/01/16 (from the past 48 hour(s))  CBC     Status: Abnormal   Collection Time: 03/05/16  9:28 AM  Result Value Ref Range   WBC 8.8 4.0 - 10.5 K/uL   RBC 3.69 (L) 4.22 - 5.81 MIL/uL   Hemoglobin 10.1 (L) 13.0 - 17.0 g/dL   HCT 32.9 (L) 39.0 - 52.0 %   MCV 89.2 78.0 - 100.0 fL   MCH 27.4 26.0 - 34.0 pg   MCHC 30.7 30.0 - 36.0 g/dL   RDW 14.7 11.5 - 15.5 %   Platelets 361 150 - 400 K/uL     Lipid Panel     Component Value Date/Time   CHOL 111 09/29/2010 1814   TRIG 130 09/29/2010 1814   HDL 17 (L) 09/29/2010 1814   CHOLHDL 6.5 09/29/2010 1814   VLDL 26 09/29/2010 1814   LDLCALC 68 09/29/2010 1814     Lab Results  Component Value Date   HGBA1C  08/29/2009    5.6 (NOTE)                                                                       According to the ADA Clinical Practice Recommendations for 2011, when HbA1c is used as a screening test:   >=6.5%   Diagnostic of Diabetes Mellitus           (if abnormal result  is confirmed)  5.7-6.4%   Increased risk of developing Diabetes Mellitus  References:Diagnosis and Classification of Diabetes Mellitus,Diabetes [id number] 1):S62-S69 and Standards of Medical Care in          Diabetes - 2011,Diabetes Care,2011,34  (Suppl 1):S11-S61.     Lab Results  Component Value Date   LDLCALC 68 09/29/2010   CREATININE 1.34 (H) 03/03/2016     HPI   65 y.o.male,with a history of COPD, CAD s/p 3 stent placements, GERD, HTN, hypothyroidism, CHF, MS and neurogenic bladder with chronic indwelling foley, presents with complaints of exertional shortness of breath, epigastric pain, and exertional Left-sided dull chest pain in his left shoulder. He has associated generalized weakness. While in the ED, EKG revealed flipped lateral leads, mildly +ve Trop. Hemoccult was negative.   Asian currently is relatively symptom-free he denies any headache, no fever chills, currently chest pain-free, does have a mild epigastric ache which is constant associated with mild nausea, denies any emesis, denies noticing any blood in stool, no black colored stool. He is getting 1 unit of packed RBC transfused in the ER, IV PPI, I have already consulted Dr. Einar Gip his cardiologist and Velora Heckler GI PA sera Trixie Deis, patient will be transferred to [facility] for further care.  HOSPITAL COURSE:   1. Anemia-hemoglobin drop from baseline of 13-8.3 between 10/17 and 11/21? Reason for anemia unclear, suspected upper GI bleed based on dyspepsia/epigastric pain.  caused from aspirin intake.CT 10/17  Showed Gastric antral/ pyloric wall thickening which could reflect severe gastritis, ulcer,  or mass ; correlation with upper endoscopy recommended to exclude gastric neoplasm.    Patient has epigastric pain, mild nausea, status post  1 untis of PRBCs. Hemoglobin now stable around 8.8. Initially placed on   IV PPI now switched to oral Protonix along with Carafate 3 times a day. [facility] has been consulted for EGD, EGD 11/24  showed mild esophagitis, nonbleeding gastric ulcer, duodenitis, H. pylori testing pending, repeat EGD recommended in 2-3 months also healing.     2. Epigastric pain, likely  secondary to GI etiology, doubt NSTEMI. Troponin flat, severe anemia-keep hemoglobin greater than 8.0, repeat troponin of 0.05. EKG shows flipped T-waves in the lateral leads. He has exertional chest pain but currently chest pain-free. Consult Cardiologist Dr. Einar Gip if  needed. Currently will have to hold aspirin due to #1 but resumed prior to discharge.   3.Acute anemia on top of Anemia of chronic disease. Due to #1 above, Hemoglobin has dropped from 13 to 8.3 in the last month. Status post transfusion of 1 units of PRBCs. LDH, vitamin B-12, INR, reticulocyte count within normal limits  4.. Chronic diastolic CHF. EF 60% on echocardiogram done 1 year ago, Patient presents with exertional shortness of breath which is due to demand ischemia from #1 and #2 above, no rales on exam, no peripheral edema, his clinically compensated from CHF standpoint,  shortness of breath likely secondary to anemia   5. CAD.  native vessel with history of proximal LAD stent with a 3.5 x 12 mm Xience DES. Diagonal 1 branch of LAD scoring balloon angioplasty. Distal RCA scoring balloon angioplasty on 11/22/2011. Last stress test in my office on 11/03/2014 was nonischemic.  Marland Kitchen Continue on coreg and hold aspirin for now. Seen by Adrian Prows, MD  6. COPD. Stable.   7. Hx of PE. Abnormal d-dimer, Patient is not on any coagulation at the time.Although d-dimer is borderline high but this is due to anemia and possible ongoing upper GI bleed,  VQ scan/venous Doppler negative for DVT/PE  8. HTN. Pressures are stablecontinue Coreg.  9. Hypothyroidism. Continue synthroid.  10.CK D stage III. Baseline creatinine close to 1.3. Monitor.  12.MS with chronic generalized weakness, neurogenic bladder with indwelling Foley. 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 rate 18, height 5\' 9"  (1.753 m), weight 87.2 kg (192 lb 3.2 oz), SpO2 98 %.  Cardiovascular system: S1 & S2 heard, RRR. No JVD, murmurs, rubs, gallops or clicks. No pedal edema. Gastrointestinal system: Abdomen is nondistended, soft and nontender. No organomegaly or masses felt. Normal bowel sounds heard. Central nervous system: Alert and oriented. No focal neurological deficits. Extremities: Symmetric 5 x 5 power. Skin: No rashes, lesions or ulcers Psychiatry: Judgement and insight appear normal. Mood & affect appropriate.     Follow-up Information    SHAH,ASHISH, MD. Schedule an appointment as soon as possible for a visit in 2 day(s).   Specialty:  Internal Medicine Why:  Call to make appointment, hospital follow-up, CBC in 2-3 days Contact information: [place] [address] [phone]        Manus Gunning, MD. Call in 2 day(s).   Specialty:  Gastroenterology Why:  Follow-up with GI in  2-3 weeks and schedule for repeat EGD in 2-3 months Contact information: [address] [place] [address] [phone]           Signed: Reyne Dumas 03/05/2016, 6:29 PM        Time spent >45 mins

## 2016-03-05 NOTE — Progress Notes (Signed)
CSW met with pt today to discuss DC planning. CSW faxed request to facilities in [place], pt request to go to [facility] and was accepted. CSW contacted facility to ensure bed availability, bed available today. CSW informed pt, pt will call his brother to advise. [facility] transportation set up for pt @ 6PM. Nurse updated. Transportation forms/Facesheet placed on pt's chart.   CSW is signing off, no other SW interventions needed at this time.  Fronia Depass B. Joline Maxcy Clinical Social Work Dept Weekend Social Worker [phone] 3:31 PM

## 2016-03-05 NOTE — Clinical Social Work Placement (Signed)
   CLINICAL SOCIAL WORK PLACEMENT  NOTE  Date:  03/05/2016  Patient Details  Name: Dale Shields MRN: [id number] Date of Birth: [birth year]  Clinical Social Work is seeking post-discharge placement for this patient at the [place] level of care (*CSW will initial, date and re-position this form in  chart as items are completed):  Yes   Patient/family provided with [place] Work Department's list of facilities offering this level of care within the geographic area requested by the patient (or if unable, by the patient's family).  Yes   Patient/family informed of their freedom to choose among providers that offer the needed level of care, that participate in Medicare, Medicaid or managed care program needed by the patient, have an available bed and are willing to accept the patient.  No   Patient/family informed of [place]'s ownership interest in [facility] and [facility], as well as of the fact that they are under no obligation to receive care at these facilities.  PASRR submitted to EDS on       PASRR number received on       Existing PASRR number confirmed on 03/05/16     FL2 transmitted to all facilities in geographic area requested by pt/family on 03/05/16     FL2 transmitted to all facilities within larger geographic area on 03/05/16     Patient informed that his/her managed care company has contracts with or will negotiate with certain facilities, including the following:        Yes   Patient/family informed of bed offers received.  Patient chooses bed at  [facility])     Physician recommends and patient chooses bed at      Patient to be transferred to [facility]) on 03/05/16.  Patient to be transferred to facility by [facility]     Patient family notified on 03/05/16 of transfer.  Name of family member notified:   (Pt will notify family)     PHYSICIAN Please sign FL2     Additional  Comment:    _______________________________________________ Serafina Mitchell, LCSWA 03/05/2016, 3:15 PM

## 2016-03-05 NOTE — Clinical Social Work Note (Signed)
Clinical Social Work Assessment  Patient Details  Name: Dale Shields MRN: [id number] Date of Birth: [birth year]  Date of referral:  03/05/16               Reason for consult:  Discharge Planning, Facility Placement                Permission sought to share information with:  Case Manager, Customer service manager, Family Supports Permission granted to share information::  Yes, Verbal Permission Granted  Name::        Agency::   (SNF's)  Relationship::     Contact Information:     Housing/Transportation Living arrangements for the past 2 months:  Single Family Home Source of Information:  Patient Patient Interpreter Needed:  None Criminal Activity/Legal Involvement Pertinent to Current Situation/Hospitalization:    Significant Relationships:  Siblings, Other Family Members Lives with:  Self Do you feel safe going back to the place where you live?  No Need for family participation in patient care:  No (Coment)  Care giving concerns:  Pt lives alone and does not have enough support to come in and help him daily. Pt does not have resources to hire private duty caregivers.   Social Worker assessment / plan:  CSW met with pt @ bedside. Pt pleasant, alert and oriented X4. Pt tearful during conversation, but agreed to go to SNF. Pt feels he won't be safe at home. Pt has some relatives in the area, however none are available to help pt at home daily. Pt had preference to go to [facility]. CSW will fax to requested facility. Pt will contact family to advise of DC plans. CSW will remain available for pt for support as needed.  Employment status:  Retired Forensic scientist:  Medicare PT Recommendations:  [place] / Referral to community resources:     Patient/Family's Response to care:  Pt very appreciative of care received and CSW's helping with DC planning.  Patient/Family's Understanding of and Emotional Response to Diagnosis, Current  Treatment, and Prognosis:  Pt very aware of medical conditions but once to get as close to base line as possible before returning home.  Emotional Assessment Appearance:  Appears stated age Attitude/Demeanor/Rapport:  Other (Pleasant) Affect (typically observed):  Accepting Orientation:  Oriented to Self, Oriented to Place, Oriented to  Time, Oriented to Situation Alcohol / Substance use:  Not Applicable Psych involvement (Current and /or in the community):  No (Comment)  Discharge Needs  Concerns to be addressed:  Home Safety Concerns Readmission within the last 30 days:  No Current discharge risk:  Lack of support system Barriers to Discharge:  Continued Medical Work up   [place], [place], [place] 03/05/2016, 3:21 PM

## 2016-03-07 ENCOUNTER — Telehealth: Payer: Self-pay

## 2016-03-07 ENCOUNTER — Encounter (HOSPITAL_COMMUNITY): Payer: Self-pay | Admitting: Gastroenterology

## 2016-03-07 NOTE — Telephone Encounter (Signed)
-----   Message from Manus Gunning, MD sent at 03/04/2016  2:54 PM EST ----- Regarding: RE: follow up egd and colnoscopy Thanks Judson Roch, Yes I would recommend putting off the colonoscopy for a few months and doing it with EGD at the same time.    ----- Message ----- From: Vena Rua, PA-C Sent: 03/04/2016  10:23 AM To: Marlon Pel, RN, Gatha Mayer, MD, # Subject: follow up egd and colnoscopy                   Hi.  Pt is due polyp surveillance colonoscopy 03/2016 and Richardson Landry suggests follow up EGD to look at ulcer found 03/04/16.  Discharging on holiday weekend so unable to make ROV appt.  Probably needs to see PA/NP or MD beforehand but RN visit may do.  He needs MAC due to long term opiates.    Sarah.

## 2016-03-07 NOTE — Telephone Encounter (Signed)
Attempted to contact patient no answer.machine.  I will try again tomorrow

## 2016-03-08 ENCOUNTER — Encounter: Payer: Self-pay | Admitting: Gastroenterology

## 2016-03-08 NOTE — Progress Notes (Signed)
Letter mailed

## 2016-03-08 NOTE — Telephone Encounter (Signed)
No answer/machine.  I will try to reach patient again tomorrow.

## 2016-03-09 NOTE — Telephone Encounter (Signed)
No answer/machine.  I will send him a letter asking to call and set up a follow up.

## 2016-03-14 NOTE — Progress Notes (Deleted)
BH MD/PA/NP OP Progress Note  03/14/2016 10:11 AM ZIGMUND MCCARNEY  MRN:  [id number]  Chief Complaint:   Subjective:  "I was doing worse" HPI:  - Since the last appointment, he was admitted for acute anemia with concern for upper GI bleeding r/o CAD.  Patient presents for follow up appointment. He states that he was anxious and had a panic attack a couple of times. He discontinued Abilify and hydroxyzine, wondering it might be the cause of it. He states that he realized that he was not on carvedilol and he restarted it since last night. He feels good good today and restarted both Abilify and hydroxyzine. He decided not to go to a local pain clinic as he did not find the treatment to be helpful. He discontinued Suboxone and was prescribed on hydrocodone.   He denies insomnia. He had SI last week but denies any this week or today. He denies AH/VH. He endorses back pain. He denies dizziness.   Visit Diagnosis:  No diagnosis found.  Past Psychiatric History:  Past Psychiatric History:Seen psychiatrist for year in 1990's for depression when he started to have weakness Psychiatry admission: [facility] in 2011, "manic" was on pain medication, steroids Used to see a therapist at nursing home 2 years ago Denies any suicide attempt  Previous Psychotropic Medications: Yes  He has been on duloxetine 60 mg for years (he has not tried higher dose before). He has tried Zoloftin 1993, and Bupropion for smoking cessation. He has been on Nuvigil for narcolepsy. He used to be on Xanax and clonazepam but it was discontinued. He had tried PT in the past with limited improvement.   Past Medical History:  Past Medical History:  Diagnosis Date  . Anemia of chronic disease   . Anxiety   . Anxiety and depression   . BPH (benign prostatic hypertrophy)   . CAD (coronary artery disease) 11/2011   Stents x 3  . Carpal tunnel syndrome   . COPD (chronic obstructive pulmonary disease) ([place])   . DDD  (degenerative disc disease)    knees, hips, shoulders--Dr. Ronnie Derby [facility] prior to this)  . Depression   . Dyspnea   . Endocarditis 2012  . Exertional dyspnea   . Fibromyalgia   . GERD (gastroesophageal reflux disease)   . H/O hiatal hernia   . Heart murmur   . History of blood transfusion    "gave me too much coumadin and heparin once"  . History of epistaxis   . History of gout   . History of MRSA infection 2012   UTI, bacteremia, and endocarditis  . History of renal failure 2011   "on dialysis 3 times over 2 years"  . HLD (hyperlipidemia)   . Hydronephrosis   . Hypertension   . Hypothyroidism   . Insomnia   . Multiple sclerosis ([place])    with optic neuritis  . Myocardial infarction    "according to stress test; that's news to me"  . Narcolepsy   . Necrotizing fasciitis ([place])   . Neurogenic bladder    chronic indwelling foley catheter-changed monthly  . Optic neuritis   . OSA (obstructive sleep apnea)    "tried CPAP; lost more sleep w/use"  . Osteoarthritis of multiple joints    "everywhere"  . Pulmonary embolism ([place])    "suspected; never confirmed"--? treated with coumadin  . Scoliosis   . Steroid-induced psychosis   . Supraspinatus tendon tear 2012  . Thrombocytopenia [facility])     Past Surgical  History:  Procedure Laterality Date  . CARPAL TUNNEL RELEASE  ~ 2010   left with radius fracture repair  . COLONOSCOPY  2000; 2005; 03/15/2011   diminutve adenoma--recall 2017  . CORONARY ANGIOPLASTY  11/22/2011   3 vessels  . CORONARY ANGIOPLASTY WITH STENT PLACEMENT  11/22/2011   DES to LAD  . ESOPHAGOGASTRODUODENOSCOPY N/A 03/04/2016   Procedure: ESOPHAGOGASTRODUODENOSCOPY (EGD);  Surgeon: Manus Gunning, MD;  Location: [place];  Service: Gastroenterology;  Laterality: N/A;  . KNEE ARTHROSCOPY W/ MENISCAL REPAIR  1990's   right  . LEFT HEART CATHETERIZATION WITH CORONARY ANGIOGRAM N/A 11/22/2011   Procedure: LEFT HEART CATHETERIZATION WITH CORONARY ANGIOGRAM;   Surgeon: Laverda Page, MD;  Location: [facility] CATH LAB;  Service: Cardiovascular;  Laterality: N/A;  . nasal cautery    . PERCUTANEOUS CORONARY STENT INTERVENTION (PCI-S)  11/22/2011   Procedure: PERCUTANEOUS CORONARY STENT INTERVENTION (PCI-S);  Surgeon: Laverda Page, MD;  Location: [facility] CATH LAB;  Service: Cardiovascular;;  . SKIN GRAFT  04/2009   right thigh to right "wrist all the way to my shoulder; necrotizing fasciitis    Family Psychiatric History: denies MH diagnosis, denies suicide attempt  Family History:  Family History  Problem Relation Age of Onset  . Alzheimer's disease Mother   . Colon cancer Neg Hx     Social History:  Social History   Social History  . Marital status: Single    Spouse name: N/A  . Number of children: N/A  . Years of education: N/A   Social History Main Topics  . Smoking status: Former Smoker    Packs/day: 0.00    Years: 40.00    Quit date: 08/15/2014  . Smokeless tobacco: Never Used     Comment: Counseling sheet to quit smoking given in exam room   . Alcohol use No     Comment: 11/22/2011 "like crazy til 1988; nothing since"  . Drug use: No     Comment: *8//13/13 "1986 til 1988 like crazy; nothing since"  . Sexual activity: Not Currently   Other Topics Concern  . Not on file   Social History Narrative   Never married, no children.   Orig from [place], [place], lived a long time in [place].   Has been in [place] since about 2004.   College at [employer] "for years".   Denies alcohol or drug use.   Cigarettes: 50 pack-yr hx--current as of 03/2013.    Allergies:  Allergies  Allergen Reactions  . Ultram [Tramadol Hcl] Other (See Comments)    Seizures; "grand mal; twice"  . Gabapentin Other (See Comments)    Made pt dilusional  . Lyrica [Pregabalin] Other (See Comments)    Leg swelling.    Metabolic Disorder Labs:  No results found for: PROLACTIN Lab Results  Component Value Date   CHOL 111 09/29/2010   TRIG 130 09/29/2010    HDL 17 (L) 09/29/2010   CHOLHDL 6.5 09/29/2010   VLDL 26 09/29/2010   LDLCALC 68 09/29/2010   LDLCALC  08/21/2009    57        Total Cholesterol/HDL:CHD Risk Coronary Heart Disease Risk Table                     Men   Women  1/2 Average Risk   3.4   3.3  Average Risk       5.0   4.4  2 X Average Risk   9.6   7.1  3 X Average  Risk  23.4   11.0        Use the calculated Patient Ratio above and the CHD Risk Table to determine the patient's CHD Risk.        ATP III CLASSIFICATION (LDL):  <100     mg/dL   Optimal  100-129  mg/dL   Near or Above                    Optimal  130-159  mg/dL   Borderline  160-189  mg/dL   High  >190     mg/dL   Very High     Current Medications: Current Outpatient Prescriptions  Medication Sig Dispense Refill  . albuterol (PROVENTIL) (2.5 MG/3ML) 0.083% nebulizer solution Take 2.5 mg by nebulization every 6 (six) hours as needed for wheezing or shortness of breath.    . ARIPiprazole (ABILIFY) 2 MG tablet Take 1 tablet (2 mg total) by mouth daily. (Patient not taking: Reported on 03/01/2016) 30 tablet 2  . aspirin 81 MG chewable tablet Chew 81 mg by mouth daily.     Marland Kitchen atorvastatin (LIPITOR) 10 MG tablet Take 10 mg by mouth daily.    Marland Kitchen b complex vitamins tablet Take 1 tablet by mouth daily.    . carvedilol (COREG) 6.25 MG tablet Take 6.25 mg by mouth 2 (two) times daily.    . DULoxetine (CYMBALTA) 60 MG capsule TAKE (1) CAPSULE BY MOUTH TWICE DAILY. 60 capsule 2  . Fluticasone-Salmeterol (ADVAIR) 250-50 MCG/DOSE AEPB Inhale 1 puff into the lungs 2 (two) times daily as needed.     . hydrOXYzine (ATARAX/VISTARIL) 25 MG tablet Take 1 tablet (25 mg total) by mouth 3 (three) times daily as needed. (Patient taking differently: Take 25 mg by mouth 3 (three) times daily as needed for anxiety. ) 90 tablet 2  . levalbuterol (XOPENEX HFA) 45 MCG/ACT inhaler Inhale 2 puffs into the lungs every 4 (four) hours as needed for wheezing.    . Multiple Vitamins-Minerals  (THEREMS-M) TABS Take 1 tablet by mouth daily.    . nitroGLYCERIN (NITROSTAT) 0.3 MG SL tablet Place 0.4 mg under the tongue every 5 (five) minutes as needed for chest pain.    Marland Kitchen NUVIGIL 250 MG tablet TAKE 1 TABLET BY MOUTH TWICE DAILY. (Patient taking differently: TAKE 1 TABLET DAILY) 60 tablet 0  . omeprazole (PRILOSEC) 20 MG capsule Take 1 capsule (20 mg total) by mouth daily. 60 capsule 0  . ondansetron (ZOFRAN ODT) 8 MG disintegrating tablet Take 1 tablet (8 mg total) by mouth every 8 (eight) hours as needed for nausea or vomiting. (Patient not taking: Reported on 03/01/2016) 10 tablet 0  . pantoprazole (PROTONIX) 40 MG tablet Take 1 tablet (40 mg total) by mouth 2 (two) times daily. 60 tablet 1  . sucralfate (CARAFATE) 1 g tablet Take 1 tablet (1 g total) by mouth 4 (four) times daily -  with meals and at bedtime. 90 tablet 1  . Thiamine HCl (VITAMIN B-1 PO) Take 1 tablet by mouth daily.    . traZODone (DESYREL) 50 MG tablet Take 1 tablet by mouth at bedtime as needed for sleep.    Marland Kitchen VITAMIN A PO Take 1 tablet by mouth daily.    . vitamin C (ASCORBIC ACID) 500 MG tablet Take 1,000 mg by mouth 2 (two) times daily.     Marland Kitchen VITAMIN E PO Take 1 tablet by mouth daily.     No current facility-administered medications for this visit.  Neurologic: Headache: No Seizure: Negative Paresthesias: No  Musculoskeletal: Strength & Muscle Tone: within normal limits Gait & Station: normal Patient leans: N/A  Psychiatric Specialty Exam: Review of Systems  Cardiovascular: Negative for palpitations.  Neurological: Negative for dizziness.  Psychiatric/Behavioral: Positive for depression. Negative for hallucinations, substance abuse and suicidal ideas. The patient is nervous/anxious. The patient does not have insomnia.   All other systems reviewed and are negative.   There were no vitals taken for this visit.There is no height or weight on file to calculate BMI.  General Appearance: Casual  Eye  Contact:  Good  Speech:  Clear and Coherent  Volume:  Normal  Mood:  Anxious  Affect:  slightly down  Thought Process:  Coherent and Goal Directed  Orientation:  Full (Time, Place, and Person)  Thought Content: Logical  Perceptions: denies AH/VH  Suicidal Thoughts:  No  Homicidal Thoughts:  No  Memory:  Immediate;   Good Recent;   Good Remote;   Good  Judgement:  Fair  Insight:  Fair  Psychomotor Activity:  Normal  Concentration:  Concentration: Good and Attention Span: Good  Recall:  Good  Fund of Knowledge: Good  Language: Good  Akathisia:  No  Handed:  Right  AIMS (if indicated):  N/A  Assets:  Communication Skills Desire for Improvement  ADL's:  Intact  Cognition: WNL  Sleep:  good   Assessment Mr.Wagoneris a 65 y.o.malewith depression, anxiety, chronic pain syndrome, multiple sclerosis, CAD with PTCA in 11/2011, Stent to LAD, hypertension, hypercholesterolemia who presented to the clinic for depression.   # MDD Patient endorses worsening in his anxiety, which coincided with discontinuing Suboxone. He also reports that he had been off carvedilol since April by mistake. It is likely that his perception of pain exacerbated anxiety. Will continue current medication at this time. Noted that he has been taking duloxetine 120 mg (not 60 mg daily) per report. He was given resources for mindfulness to practice at home. Provide information for [facility] clinic [facility]. It is noted that routine follow up visit with his PCP is strongly recommended for this patient to ameliorate his chronic physical symptoms. He declines the option to see PT but agreed for behavioral activation.   # Hypotension Patient is hypotensive at today's visit. Encouraged hydration and advised to hold carvedilol if SBP<90.  Advised patient to discuss with his PCP for further evaluation. Patient denies dizziness or palpitation on today's evaluation.   Plans - Continue duloxetine 60 mg twice a day -  Continue Abilify 2 mg daily - hydroxyzine 25 mg TID PRN for anxiety - Return to clinic in one month - Patient to practice mindfulness - Patient to contact pain clinic  The patient demonstrates the following risk factors for suicide: Chronic risk factorsfor suicide include psychiatric disorder of depression,medical illnessof chronic fatigue syndrome/chronic pain, demographic factors (male, >64 yo), completed suicide in a family member, Acute risk factorsfor suicide includeN/A. Protective factorsfor this patient include positive social support, positive therapeutic relationship, coping skills, hope for the future. Considering these factors, the overall suicide risk at this point appears to be low.  Treatment Plan Summary:Medication management  Norman Clay, MD 03/14/2016, 10:11 AM

## 2016-03-15 ENCOUNTER — Ambulatory Visit (HOSPITAL_COMMUNITY): Payer: Medicare Other | Admitting: Psychiatry

## 2016-03-15 ENCOUNTER — Encounter (HOSPITAL_COMMUNITY): Payer: Self-pay

## 2016-03-22 ENCOUNTER — Encounter: Payer: Self-pay | Admitting: Internal Medicine

## 2016-04-01 ENCOUNTER — Telehealth (HOSPITAL_COMMUNITY): Payer: Self-pay | Admitting: *Deleted

## 2016-04-01 NOTE — Telephone Encounter (Signed)
voice message from patient on 04/01/16.  He said he is in a nursing home and will call back to schedule an appointment.

## 2016-04-06 NOTE — Telephone Encounter (Signed)
Tried calling pt number on file several times to get more informations per previous message but number keeps giving a busy tone.

## 2016-04-06 NOTE — Telephone Encounter (Signed)
noted 

## 2016-04-29 ENCOUNTER — Encounter (HOSPITAL_COMMUNITY): Payer: Self-pay | Admitting: *Deleted

## 2016-04-29 ENCOUNTER — Emergency Department (HOSPITAL_COMMUNITY)
Admission: EM | Admit: 2016-04-29 | Discharge: 2016-04-29 | Disposition: A | Discharge status: Home / Self Care | Payer: Medicare Other | Attending: Emergency Medicine | Admitting: Emergency Medicine

## 2016-04-29 DIAGNOSIS — E039 Hypothyroidism, unspecified: Secondary | ICD-10-CM | POA: Diagnosis not present

## 2016-04-29 DIAGNOSIS — Z79899 Other long term (current) drug therapy: Secondary | ICD-10-CM | POA: Diagnosis not present

## 2016-04-29 DIAGNOSIS — N390 Urinary tract infection, site not specified: Secondary | ICD-10-CM

## 2016-04-29 DIAGNOSIS — Y829 Unspecified medical devices associated with adverse incidents: Secondary | ICD-10-CM | POA: Diagnosis not present

## 2016-04-29 DIAGNOSIS — F1123 Opioid dependence with withdrawal: Secondary | ICD-10-CM | POA: Insufficient documentation

## 2016-04-29 DIAGNOSIS — R197 Diarrhea, unspecified: Secondary | ICD-10-CM | POA: Diagnosis not present

## 2016-04-29 DIAGNOSIS — Z87891 Personal history of nicotine dependence: Secondary | ICD-10-CM | POA: Insufficient documentation

## 2016-04-29 DIAGNOSIS — Z7982 Long term (current) use of aspirin: Secondary | ICD-10-CM | POA: Insufficient documentation

## 2016-04-29 DIAGNOSIS — T83511A Infection and inflammatory reaction due to indwelling urethral catheter, initial encounter: Secondary | ICD-10-CM | POA: Insufficient documentation

## 2016-04-29 DIAGNOSIS — I2510 Atherosclerotic heart disease of native coronary artery without angina pectoris: Secondary | ICD-10-CM | POA: Diagnosis not present

## 2016-04-29 DIAGNOSIS — F1193 Opioid use, unspecified with withdrawal: Secondary | ICD-10-CM

## 2016-04-29 DIAGNOSIS — R531 Weakness: Secondary | ICD-10-CM | POA: Diagnosis present

## 2016-04-29 DIAGNOSIS — J449 Chronic obstructive pulmonary disease, unspecified: Secondary | ICD-10-CM | POA: Insufficient documentation

## 2016-04-29 LAB — URINALYSIS, ROUTINE W REFLEX MICROSCOPIC
Bilirubin Urine: NEGATIVE
GLUCOSE, UA: NEGATIVE mg/dL
HGB URINE DIPSTICK: NEGATIVE
Ketones, ur: NEGATIVE mg/dL
Nitrite: POSITIVE — AB
PH: 7 (ref 5.0–8.0)
PROTEIN: NEGATIVE mg/dL
Specific Gravity, Urine: 1.015 (ref 1.005–1.030)

## 2016-04-29 LAB — BASIC METABOLIC PANEL
Anion gap: 10 (ref 5–15)
BUN: 17 mg/dL (ref 6–20)
CALCIUM: 9.6 mg/dL (ref 8.9–10.3)
CHLORIDE: 96 mmol/L — AB (ref 101–111)
CO2: 31 mmol/L (ref 22–32)
CREATININE: 1.23 mg/dL (ref 0.61–1.24)
GFR calc non Af Amer: >60 mL/min (ref >60)
GLUCOSE: 111 mg/dL — AB (ref 65–99)
Potassium: 4.1 mmol/L (ref 3.5–5.1)
Sodium: 137 mmol/L (ref 135–145)

## 2016-04-29 LAB — TROPONIN I: Troponin I: <0.03 ng/mL (ref <0.03)

## 2016-04-29 LAB — CBC
HCT: 36.2 % — ABNORMAL LOW (ref 39.0–52.0)
Hemoglobin: 11.4 g/dL — ABNORMAL LOW (ref 13.0–17.0)
MCH: 26.2 pg (ref 26.0–34.0)
MCHC: 31.5 g/dL (ref 30.0–36.0)
MCV: 83.2 fL (ref 78.0–100.0)
PLATELETS: 319 10*3/uL (ref 150–400)
RBC: 4.35 MIL/uL (ref 4.22–5.81)
RDW: 15.6 % — AB (ref 11.5–15.5)
WBC: 11.1 10*3/uL — ABNORMAL HIGH (ref 4.0–10.5)

## 2016-04-29 LAB — POC OCCULT BLOOD, ED: Fecal Occult Bld: NEGATIVE

## 2016-04-29 MED ORDER — MORPHINE SULFATE (PF) 4 MG/ML IV SOLN
4.0000 mg | Freq: Once | INTRAVENOUS | Status: AC
  Administered 2016-04-29: 4 mg via INTRAVENOUS
  Filled 2016-04-29: qty 1

## 2016-04-29 MED ORDER — SODIUM CHLORIDE 0.9 % IV BOLUS (SEPSIS)
1000.0000 mL | Freq: Once | INTRAVENOUS | Status: AC
  Administered 2016-04-29: 1000 mL via INTRAVENOUS

## 2016-04-29 MED ORDER — ONDANSETRON HCL 4 MG/2ML IJ SOLN
4.0000 mg | Freq: Once | INTRAMUSCULAR | Status: AC
  Administered 2016-04-29: 4 mg via INTRAVENOUS
  Filled 2016-04-29: qty 2

## 2016-04-29 MED ORDER — CEPHALEXIN 500 MG PO CAPS: 500.0000 mg | ORAL_CAPSULE | Freq: Four times a day (QID) | ORAL | 0 refills | Status: DC

## 2016-04-29 MED ORDER — DEXTROSE 5 % IV SOLN
1.0000 g | Freq: Once | INTRAVENOUS | Status: AC
  Administered 2016-04-29: 1 g via INTRAVENOUS
  Filled 2016-04-29: qty 10

## 2016-04-29 NOTE — Discharge Instructions (Signed)
You need to talk to your PCP regarding chronic pain control.  We can not prescribe narcotics for chronic pain from the emergency department.

## 2016-04-29 NOTE — ED Triage Notes (Signed)
Pt comes in by EMS for generalized weakness. Pt was here one month ago for similar problems and was admitted. At that time he had tried to take himself off his medications. Pt states that his weakness started 3-4 days. This started 1 days after he had a change in his medication. Pt was on morphine but got switched to hydrocodone. Pt is alert and oriented at this time.

## 2016-04-29 NOTE — ED Provider Notes (Signed)
[facility] DEPT Provider Note   CSN: [id number] Arrival date & time: 04/29/16  1042     History   Chief Complaint Chief Complaint  Patient presents with  . Weakness    HPI CASMIER TARDIF is a 66 y.o. male.  Pt presents to the ED today with weakness.  The pt is also having withdraw sx from being out of his morphine.  The pt said that he got into a disagreement with his pain doctors who wanted to try some non narcotic treatments for pain.  So, he left that practice.  He said he is trying to get set up with a pain clinic at [facility].  He has been out of his morphine for 4 days.  He has had diarrhea and chills due to that.  Pt said he also was told he was pale.  He was admitted about a month ago for a GI bleed from a gastric ulcer and NSTEMI.  He required blood transfusion.        Past Medical History:  Diagnosis Date  . Anemia of chronic disease   . Anxiety   . Anxiety and depression   . BPH (benign prostatic hypertrophy)   . CAD (coronary artery disease) 11/2011   Stents x 3  . Carpal tunnel syndrome   . COPD (chronic obstructive pulmonary disease) ([place])   . DDD (degenerative disc disease)    knees, hips, shoulders--Dr. Ronnie [facility] prior to this)  . Depression   . Dyspnea   . Endocarditis 2012  . Exertional dyspnea   . Fibromyalgia   . GERD (gastroesophageal reflux disease)   . H/O hiatal hernia   . Heart murmur   . History of blood transfusion    "gave me too much coumadin and heparin once"  . History of epistaxis   . History of gout   . History of MRSA infection 2012   UTI, bacteremia, and endocarditis  . History of renal failure 2011   "on dialysis 3 times over 2 years"  . HLD (hyperlipidemia)   . Hydronephrosis   . Hypertension   . Hypothyroidism   . Insomnia   . Multiple sclerosis ([place])    with optic neuritis  . Myocardial infarction    "according to stress test; that's news to me"  . Narcolepsy   . Necrotizing fasciitis ([place])   . Neurogenic bladder     chronic indwelling foley catheter-changed monthly  . Optic neuritis   . OSA (obstructive sleep apnea)    "tried CPAP; lost more sleep w/use"  . Osteoarthritis of multiple joints    "everywhere"  . Pulmonary embolism ([place])    "suspected; never confirmed"--? treated with coumadin  . Scoliosis   . Steroid-induced psychosis   . Supraspinatus tendon tear 2012  . Thrombocytopenia [facility])     Patient Active Problem List   Diagnosis Date Noted  . Gastric ulcer with hemorrhage   . SOB (shortness of breath) 03/01/2016  . Anemia 03/01/2016  . NSTEMI (non-ST elevated myocardial infarction) ([place]) 03/01/2016  . Major depressive disorder, recurrent episode, moderate ([place]) 01/15/2016  . Depression 01/11/2016  . Hyperlipidemia 01/11/2016  . Anxiety 01/11/2016  . Atypical chest pain 04/08/2015  . UTI (urinary tract infection) 04/08/2015  . Chest pain, rule out acute myocardial infarction 04/06/2015  . Chest pain 04/06/2015  . Hydronephrosis 11/18/2014  . Catheter-associated urinary tract infection ([place])   . Malnutrition of moderate degree ([place]) 11/17/2014  . AKI (acute kidney injury) ([place]) 11/16/2014  . Hematuria  11/16/2014  . Generalized weakness 11/16/2014  . FTT (failure to thrive) in adult 11/16/2014  . Acute encephalopathy 11/16/2014  . Anemia of chronic disease 11/16/2014  . COPD (chronic obstructive pulmonary disease) ([place]) 11/15/2014  . CAD (coronary artery disease) 11/15/2014  . Chronic pain syndrome 04/11/2013  . Multiple sclerosis ([place]) 04/11/2013    Past Surgical History:  Procedure Laterality Date  . CARPAL TUNNEL RELEASE  ~ 2010   left with radius fracture repair  . COLONOSCOPY  2000; 2005; 03/15/2011   diminutve adenoma--recall 2017  . CORONARY ANGIOPLASTY  11/22/2011   3 vessels  . CORONARY ANGIOPLASTY WITH STENT PLACEMENT  11/22/2011   DES to LAD  . ESOPHAGOGASTRODUODENOSCOPY N/A 03/04/2016   Procedure: ESOPHAGOGASTRODUODENOSCOPY (EGD);  Surgeon: Manus Gunning, MD;  Location: [facility];  Service: Gastroenterology;  Laterality: N/A;  . KNEE ARTHROSCOPY W/ MENISCAL REPAIR  1990's   right  . LEFT HEART CATHETERIZATION WITH CORONARY ANGIOGRAM N/A 11/22/2011   Procedure: LEFT HEART CATHETERIZATION WITH CORONARY ANGIOGRAM;  Surgeon: Laverda Page, MD;  Location[facility] C[facility] LAB;  Service: Cardiovascular;  Laterality: N/A;  . nasal cautery    . PERCUTANEOUS CORONARY STENT INTERVENTION (PCI-S)  11/22/2011   Procedure: PERCUTANEOUS CORONARY STENT INTERVENTION (PCI-S);  Surgeon: Laverda Page, MD;  Location: [facility] CATH LAB;  Service: Cardiovascular;;  . SKIN GRAFT  04/2009   right thigh to right "wrist all the way to my shoulder; necrotizing fasciitis       Home Medications    Prior to Admission medications   Medication Sig Start Date End Date Taking? Authorizing Provider  aspirin 81 MG chewable tablet Chew 81 mg by mouth daily.    Yes Historical Provider, MD  atorvastatin (LIPITOR) 10 MG tablet Take 10 mg by mouth daily.   Yes Historical Provider, MD  carvedilol (COREG) 6.25 MG tablet Take 6.25 mg by mouth 2 (two) times daily. 02/17/16  Yes Historical Provider, MD  sucralfate (CARAFATE) 1 g tablet Take 1 tablet (1 g total) by mouth 4 (four) times daily -  with meals and at bedtime. 03/04/16  Yes Reyne Dumas, MD  vitamin C (ASCORBIC ACID) 500 MG tablet Take 1,000 mg by mouth 2 (two) times daily.    Yes Historical Provider, MD  albuterol (PROVENTIL) (2.5 MG/3ML) 0.083% nebulizer solution Take 2.5 mg by nebulization every 6 (six) hours as needed for wheezing or shortness of breath.    Historical Provider, MD  ARIPiprazole (ABILIFY) 2 MG tablet Take 1 tablet (2 mg total) by mouth daily. Patient not taking: Reported on 03/01/2016 02/18/16 02/17/17  Norman Clay, MD  b complex vitamins tablet Take 1 tablet by mouth daily.    Historical Provider, MD  cephALEXin (KEFLEX) 500 MG capsule Take 1 capsule (500 mg total) by mouth 4 (four) times daily.  04/29/16   Isla Pence, MD  DULoxetine (CYMBALTA) 60 MG capsule TAKE (1) CAPSULE BY MOUTH TWICE DAILY. 02/18/16   Norman Clay, MD  Fluticasone-Salmeterol (ADVAIR) 250-50 MCG/DOSE AEPB Inhale 1 puff into the lungs 2 (two) times daily as needed.     Historical Provider, MD  hydrOXYzine (ATARAX/VISTARIL) 25 MG tablet Take 1 tablet (25 mg total) by mouth 3 (three) times daily as needed. Patient taking differently: Take 25 mg by mouth 3 (three) times daily as needed for anxiety.  02/18/16   Norman Clay, MD  levalbuterol [facility] HFA) 45 MCG/ACT inhaler Inhale 2 puffs into the lungs every 4 (four) hours as needed for wheezing.    Historical  Provider, MD  Multiple Vitamins-Minerals (THEREMS-M) TABS Take 1 tablet by mouth daily.    Historical Provider, MD  nitroGLYCERIN (NITROSTAT) 0.3 MG SL tablet Place 0.4 mg under the tongue every 5 (five) minutes as needed for chest pain.    Historical Provider, MD  NUVIGIL 250 MG tablet TAKE 1 TABLET BY MOUTH TWICE DAILY. Patient taking differently: TAKE 1 TABLET DAILY 10/02/15   Wardell Honour, MD  omeprazole (PRILOSEC) 20 MG capsule Take 1 capsule (20 mg total) by mouth daily. 01/26/16   Jola Schmidt, MD  ondansetron (ZOFRAN ODT) 8 MG disintegrating tablet Take 1 tablet (8 mg total) by mouth every 8 (eight) hours as needed for nausea or vomiting. Patient not taking: Reported on 03/01/2016 01/26/16   Jola Schmidt, MD  pantoprazole (PROTONIX) 40 MG tablet Take 1 tablet (40 mg total) by mouth 2 (two) times daily. 03/04/16   Reyne Dumas, MD  Thiamine HCl (VITAMIN B-1 PO) Take 1 tablet by mouth daily.    Historical Provider, MD  traZODone (DESYREL) 50 MG tablet Take 1 tablet by mouth at bedtime as needed for sleep. 02/15/16   Historical Provider, MD  VITAMIN A PO Take 1 tablet by mouth daily.    Historical Provider, MD  VITAMIN E PO Take 1 tablet by mouth daily.    Historical Provider, MD    Family History Family History  Problem Relation Age of Onset  .  Alzheimer's disease Mother   . Colon cancer Neg Hx     Social History Social History  Substance Use Topics  . Smoking status: Former Smoker    Packs/day: 0.00    Years: 40.00    Quit date: 08/15/2014  . Smokeless tobacco: Never Used     Comment: Counseling sheet to quit smoking given in exam room   . Alcohol use No     Comment: 11/22/2011 "like crazy til 1988; nothing since"     Allergies   Ultram [tramadol hcl]; Gabapentin; and Lyrica [pregabalin]   Review of Systems Review of Systems  Gastrointestinal: Positive for diarrhea.  Neurological: Positive for weakness.  All other systems reviewed and are negative.    Physical Exam Updated Vital Signs BP 121/66   Pulse 72   Temp 98.7 F (37.1 C) (Oral)   Resp 13   Ht 5\' 8"  (1.727 m)   Wt 195 lb (88.5 kg)   SpO2 97%   BMI 29.65 kg/m   Physical Exam  Constitutional: He is oriented to person, place, and time. He appears well-developed and well-nourished.  HENT:  Head: Normocephalic and atraumatic.  Right Ear: External ear normal.  Left Ear: External ear normal.  Nose: Nose normal.  Mouth/Throat: Oropharynx is clear and moist.  Eyes: Conjunctivae and EOM are normal. Pupils are equal, round, and reactive to light.  Neck: Normal range of motion. Neck supple.  Cardiovascular: Normal rate, regular rhythm, normal heart sounds and intact distal pulses.   Pulmonary/Chest: Effort normal and breath sounds normal.  Abdominal: Soft. Bowel sounds are normal.  Genitourinary:  Genitourinary Comments: Indwelling foley catheter due to neurogenic bladder  Musculoskeletal: Normal range of motion.  Neurological: He is alert and oriented to person, place, and time.  Generalized weakness secondary to MS  Skin: Skin is warm.  Psychiatric: He has a normal mood and affect. His behavior is normal. Judgment and thought content normal.  Nursing note and vitals reviewed.    ED Treatments / Results  Labs (all labs ordered are listed, but  only abnormal  results are displayed) Labs Reviewed  BASIC METABOLIC PANEL - Abnormal; Notable for the following:       Result Value   Chloride 96 (*)    Glucose, Bld 111 (*)    All other components within normal limits  CBC - Abnormal; Notable for the following:    WBC 11.1 (*)    Hemoglobin 11.4 (*)    HCT 36.2 (*)    RDW 15.6 (*)    All other components within normal limits  URINALYSIS, ROUTINE W REFLEX MICROSCOPIC - Abnormal; Notable for the following:    APPearance HAZY (*)    Nitrite POSITIVE (*)    Leukocytes, UA LARGE (*)    Bacteria, UA MANY (*)    All other components within normal limits  URINE CULTURE  TROPONIN I  POC OCCULT BLOOD, ED    EKG  EKG Interpretation  Date/Time:  Friday April 29 2016 10:51:59 EST Ventricular Rate:  79 PR Interval:    QRS Duration: 119 QT Interval:  391 QTC Calculation: 449 R Axis:   82 Text Interpretation:  Sinus rhythm Nonspecific intraventricular conduction delay Nonspecific T abnrm, anterolateral leads Baseline wander in lead(s) V4 No significant change since last tracing Confirmed by [facility] MD, Maurice Ramseur ([id number]) on 04/29/2016 12:10:16 PM       Radiology No results found.  Procedures Procedures (including critical care time)  Medications Ordered in ED Medications  morphine 4 MG/ML injection 4 mg (not administered)  cefTRIAXone (ROCEPHIN) 1 g in dextrose 5 % 50 mL IVPB (not administered)  sodium chloride 0.9 % bolus 1,000 mL (1,000 mLs Intravenous New Bag/Given 04/29/16 1239)  morphine 4 MG/ML injection 4 mg (4 mg Intravenous Given 04/29/16 1239)  ondansetron (ZOFRAN) injection 4 mg (4 mg Intravenous Given 04/29/16 1239)     Initial Impression / Assessment and Plan / ED Course  I have reviewed the triage vital signs and the nursing notes.  Pertinent labs & imaging results that were available during my care of the patient were reviewed by me and considered in my medical decision making (see chart for details).   Pt is  having sx of withdrawal.  He was told to speak with his pcp regarding chronic pain medication clinic.  The pt's last urine culture was pansensitive, so he was given rocephin in the ED and keflex for home.  His urine will be sent for culture.  Final Clinical Impressions(s) / ED Diagnoses   Final diagnoses:  Opiate withdrawal ([place])  Urinary tract infection associated with indwelling urethral catheter, initial encounter ([place])    New Prescriptions New Prescriptions   CEPHALEXIN (KEFLEX) 500 MG CAPSULE    Take 1 capsule (500 mg total) by mouth 4 (four) times daily.     Isla Pence, MD 04/29/16 1331

## 2016-04-29 NOTE — ED Notes (Signed)
EMS called to transport patient back to facility.

## 2016-04-29 NOTE — ED Notes (Signed)
This nurse attempted to give report to [facility]. Phone line is busy.

## 2016-04-29 NOTE — ED Notes (Signed)
Report given to [facility] at [facility]. They state they have no ride for him at this time that he will need to be sent back via EMS. Nira Conn made aware that they would accrue any of these charges since pt is ambulatory.

## 2016-05-04 LAB — URINE CULTURE

## 2016-05-05 ENCOUNTER — Telehealth (HOSPITAL_BASED_OUTPATIENT_CLINIC_OR_DEPARTMENT_OTHER): Payer: Self-pay

## 2016-05-05 NOTE — Telephone Encounter (Signed)
Post ED Visit - Positive Culture Follow-up  Culture report reviewed by antimicrobial stewardship pharmacist:  []  Elenor Quinones, Pharm.D. []  Heide Guile, Pharm.D., BCPS []  Parks Neptune, Pharm.D. []  Alycia Rossetti, Pharm.D., BCPS []  [place], Pharm.D., BCPS, AAHIVP []  Legrand Como, Pharm.D., BCPS, AAHIVP []  Milus Glazier, Pharm.D. []  Stephens November, [place].DStanford Breed, Pharm.D.  Positive urine culture, 50,000 colonies Enterobacter Species & >/= 100,000 colonies -> Pseudomonas Aeruginosa Treated with Cephalexin, organism sensitive to the same and no further patient follow-up is required at this time.  Chart reviewed by Domenic Moras PA "No change"  Dortha Kern 05/05/2016, 2:04 PM

## 2016-08-18 ENCOUNTER — Emergency Department (HOSPITAL_COMMUNITY): Payer: Medicare Other

## 2016-08-18 ENCOUNTER — Encounter (HOSPITAL_COMMUNITY): Payer: Self-pay | Admitting: Emergency Medicine

## 2016-08-18 ENCOUNTER — Emergency Department (HOSPITAL_COMMUNITY)
Admission: EM | Admit: 2016-08-18 | Discharge: 2016-08-18 | Disposition: A | Discharge status: Home / Self Care | Payer: Medicare Other | Attending: Emergency Medicine | Admitting: Emergency Medicine

## 2016-08-18 DIAGNOSIS — J449 Chronic obstructive pulmonary disease, unspecified: Secondary | ICD-10-CM | POA: Diagnosis not present

## 2016-08-18 DIAGNOSIS — E039 Hypothyroidism, unspecified: Secondary | ICD-10-CM | POA: Insufficient documentation

## 2016-08-18 DIAGNOSIS — I2510 Atherosclerotic heart disease of native coronary artery without angina pectoris: Secondary | ICD-10-CM | POA: Insufficient documentation

## 2016-08-18 DIAGNOSIS — M25561 Pain in right knee: Secondary | ICD-10-CM | POA: Diagnosis not present

## 2016-08-18 DIAGNOSIS — I10 Essential (primary) hypertension: Secondary | ICD-10-CM | POA: Diagnosis not present

## 2016-08-18 DIAGNOSIS — G8929 Other chronic pain: Secondary | ICD-10-CM | POA: Diagnosis not present

## 2016-08-18 DIAGNOSIS — Z79899 Other long term (current) drug therapy: Secondary | ICD-10-CM | POA: Diagnosis not present

## 2016-08-18 DIAGNOSIS — R110 Nausea: Secondary | ICD-10-CM | POA: Insufficient documentation

## 2016-08-18 DIAGNOSIS — R197 Diarrhea, unspecified: Secondary | ICD-10-CM | POA: Insufficient documentation

## 2016-08-18 DIAGNOSIS — Z87891 Personal history of nicotine dependence: Secondary | ICD-10-CM | POA: Insufficient documentation

## 2016-08-18 LAB — CBC WITH DIFFERENTIAL/PLATELET
BASOS ABS: 0 10*3/uL (ref 0.0–0.1)
BASOS PCT: 0 %
Eosinophils Absolute: 0.2 10*3/uL (ref 0.0–0.7)
Eosinophils Relative: 1 %
HEMATOCRIT: 34.7 % — AB (ref 39.0–52.0)
HEMOGLOBIN: 10.6 g/dL — AB (ref 13.0–17.0)
LYMPHS PCT: 10 %
Lymphs Abs: 1.6 10*3/uL (ref 0.7–4.0)
MCH: 27 pg (ref 26.0–34.0)
MCHC: 30.5 g/dL (ref 30.0–36.0)
MCV: 88.5 fL (ref 78.0–100.0)
MONO ABS: 1.6 10*3/uL — AB (ref 0.1–1.0)
Monocytes Relative: 10 %
NEUTROS ABS: 13 10*3/uL — AB (ref 1.7–7.7)
NEUTROS PCT: 79 %
Platelets: 304 10*3/uL (ref 150–400)
RBC: 3.92 MIL/uL — ABNORMAL LOW (ref 4.22–5.81)
RDW: 18.1 % — AB (ref 11.5–15.5)
WBC: 16.5 10*3/uL — ABNORMAL HIGH (ref 4.0–10.5)

## 2016-08-18 LAB — BASIC METABOLIC PANEL
ANION GAP: 9 (ref 5–15)
BUN: 15 mg/dL (ref 6–20)
CALCIUM: 9.3 mg/dL (ref 8.9–10.3)
CO2: 34 mmol/L — AB (ref 22–32)
Chloride: 95 mmol/L — ABNORMAL LOW (ref 101–111)
Creatinine, Ser: 1.18 mg/dL (ref 0.61–1.24)
GFR calc non Af Amer: >60 mL/min (ref >60)
GLUCOSE: 86 mg/dL (ref 65–99)
POTASSIUM: 4.1 mmol/L (ref 3.5–5.1)
Sodium: 138 mmol/L (ref 135–145)

## 2016-08-18 MED ORDER — ONDANSETRON 8 MG PO TBDP
8.0000 mg | ORAL_TABLET | Freq: Once | ORAL | Status: AC
  Administered 2016-08-18: 8 mg via ORAL
  Filled 2016-08-18: qty 1

## 2016-08-18 MED ORDER — ALPRAZOLAM 0.5 MG PO TABS
0.2500 mg | ORAL_TABLET | Freq: Once | ORAL | Status: AC
  Administered 2016-08-18: 0.25 mg via ORAL
  Filled 2016-08-18: qty 1

## 2016-08-18 MED ORDER — OXYCODONE-ACETAMINOPHEN 5-325 MG PO TABS
1.0000 | ORAL_TABLET | Freq: Once | ORAL | Status: AC
  Administered 2016-08-18: 1 via ORAL
  Filled 2016-08-18: qty 1

## 2016-08-18 NOTE — ED Provider Notes (Addendum)
[facility] DEPT Provider Note   CSN: [id number] Arrival date & time: 08/18/16  1417     History   Chief Complaint Chief Complaint  Patient presents with  . Diarrhea    HPI Dale Shields is a 66 y.o. male.  HPI History is obtained from patient and from Winfred Leeds, assistant nursing director at skilled nursing facility complains of diarrhea onset one week ago. He reports 3 episodes of diarrhea today. He vomited one time 5 days ago complains of slight nausea presently. Other associated symptoms include right knee pain which is severe and chronic. He was treated with methadone immediately prior to coming here. No blood per rectum. No fever. Nothing makes symptoms better or worse. Of note patient was on antibiotics approximately 3 weeks ago Past Medical History:  Diagnosis Date  . Anemia of chronic disease   . Anxiety   . Anxiety and depression   . BPH (benign prostatic hypertrophy)   . CAD (coronary artery disease) 11/2011   Stents x 3  . Carpal tunnel syndrome   . COPD (chronic obstructive pulmonary disease) ([place])   . DDD (degenerative disc disease)    knees, hips, shoulders--Dr. Ronnie Derby [facility] prior to this)  . Depression   . Dyspnea   . Endocarditis 2012  . Exertional dyspnea   . Fibromyalgia   . GERD (gastroesophageal reflux disease)   . H/O hiatal hernia   . Heart murmur   . History of blood transfusion    "gave me too much coumadin and heparin once"  . History of epistaxis   . History of gout   . History of MRSA infection 2012   UTI, bacteremia, and endocarditis  . History of renal failure 2011   "on dialysis 3 times over 2 years"  . HLD (hyperlipidemia)   . Hydronephrosis   . Hypertension   . Hypothyroidism   . Insomnia   . Multiple sclerosis ([place])    with optic neuritis  . Myocardial infarction [facility])    "according to stress test; that's news to me"  . Narcolepsy   . Necrotizing fasciitis ([place])   . Neurogenic bladder    chronic indwelling foley  catheter-changed monthly  . Optic neuritis   . OSA (obstructive sleep apnea)    "tried CPAP; lost more sleep w/use"  . Osteoarthritis of multiple joints    "everywhere"  . Pulmonary embolism ([place])    "suspected; never confirmed"--? treated with coumadin  . Scoliosis   . Steroid-induced psychosis   . Supraspinatus tendon tear 2012  . Thrombocytopenia [facility])     Patient Active Problem List   Diagnosis Date Noted  . Gastric ulcer with hemorrhage   . SOB (shortness of breath) 03/01/2016  . Anemia 03/01/2016  . NSTEMI (non-ST elevated myocardial infarction) ([place]) 03/01/2016  . Major depressive disorder, recurrent episode, moderate ([place]) 01/15/2016  . Depression 01/11/2016  . Hyperlipidemia 01/11/2016  . Anxiety 01/11/2016  . Atypical chest pain 04/08/2015  . UTI (urinary tract infection) 04/08/2015  . Chest pain, rule out acute myocardial infarction 04/06/2015  . Chest pain 04/06/2015  . Hydronephrosis 11/18/2014  . Catheter-associated urinary tract infection ([place])   . Malnutrition of moderate degree ([place]) 11/17/2014  . AKI (acute kidney injury) ([place]) 11/16/2014  . Hematuria 11/16/2014  . Generalized weakness 11/16/2014  . FTT (failure to thrive) in adult 11/16/2014  . Acute encephalopathy 11/16/2014  . Anemia of chronic disease 11/16/2014  . COPD (chronic obstructive pulmonary disease) ([place]) 11/15/2014  . CAD (coronary artery  disease) 11/15/2014  . Chronic pain syndrome 04/11/2013  . Multiple sclerosis ([place]) 04/11/2013    Past Surgical History:  Procedure Laterality Date  . CARPAL TUNNEL RELEASE  ~ 2010   left with radius fracture repair  . COLONOSCOPY  2000; 2005; 03/15/2011   diminutve adenoma--recall 2017  . CORONARY ANGIOPLASTY  11/22/2011   3 vessels  . CORONARY ANGIOPLASTY WITH STENT PLACEMENT  11/22/2011   DES to LAD  . ESOPHAGOGASTRODUODENOSCOPY N/A 03/04/2016   Procedure: ESOPHAGOGASTRODUODENOSCOPY (EGD);  Surgeon: Manus Gunning, MD;  Location: [facility];  Service: Gastroenterology;  Laterality: N/A;  . KNEE ARTHROSCOPY W/ MENISCAL REPAIR  1990's   right  . LEFT HEART CATHETERIZATION WITH CORONARY ANGIOGRAM N/A 11/22/2011   Procedure: LEFT HEART CATHETERIZATION WITH CORONARY ANGIOGRAM;  Surgeon: Laverda Page, MD;  Location: [facility] CATH LAB;  Service: Cardiovascular;  Laterality: N/A;  . nasal cautery    . PERCUTANEOUS CORONARY STENT INTERVENTION (PCI-S)  11/22/2011   Procedure: PERCUTANEOUS CORONARY STENT INTERVENTION (PCI-S);  Surgeon: Laverda Page, MD;  Location: [facility] CATH LAB;  Service: Cardiovascular;;  . SKIN GRAFT  04/2009   right thigh to right "wrist all the way to my shoulder; necrotizing fasciitis       Home Medications    Prior to Admission medications   Medication Sig Start Date End Date Taking? Authorizing Provider  acetaminophen (TYLENOL) 650 MG CR tablet Take 650 mg by mouth every 4 (four) hours as needed for pain.   Yes [provider]  albuterol (PROVENTIL HFA;VENTOLIN HFA) 108 (90 Base) MCG/ACT inhaler Inhale 2 puffs into the lungs as needed for wheezing or shortness of breath.   Yes [provider]  ALPRAZolam (XANAX) 0.25 MG tablet Take 0.25 mg by mouth 3 (three) times daily as needed for anxiety.   Yes [provider]  apixaban (ELIQUIS) 5 MG TABS tablet Take 5 mg by mouth 2 (two) times daily.   Yes [provider]  ARIPiprazole (ABILIFY) 2 MG tablet Take 1 tablet (2 mg total) by mouth daily. Patient taking differently: Take 5 mg by mouth daily.  02/18/16 02/17/17 Yes Hisada, Elie Goody, MD  atorvastatin (LIPITOR) 10 MG tablet Take 10 mg by mouth daily.   Yes [provider]  digoxin (LANOXIN) 0.125 MG tablet Take 0.125 mg by mouth daily.   Yes [provider]  diltiazem (CARDIZEM CD) 120 MG 24 hr capsule Take 120 mg by mouth daily.   Yes [provider]  DULoxetine (CYMBALTA) 60 MG capsule TAKE (1) CAPSULE BY MOUTH TWICE DAILY. 02/18/16  Yes Norman Clay, MD  ergocalciferol (VITAMIN D2) 50000 units capsule Take 50,000 Units by mouth once a week.   Yes [provider]  eszopiclone (LUNESTA) 1 MG TABS tablet Take 1 mg by mouth at bedtime. Take immediately before bedtime   Yes [provider]  ferrous sulfate 325 (65 FE) MG EC tablet Take 325 mg by mouth daily with breakfast.   Yes [provider]  Fluticasone-Salmeterol (ADVAIR) 250-50 MCG/DOSE AEPB Inhale 1 puff into the lungs 2 (two) times daily as needed.    Yes [provider]  levalbuterol (XOPENEX) 1.25 MG/3ML nebulizer solution Take 1.25 mg by nebulization every 6 (six) hours as needed for wheezing.   Yes [provider]  loperamide (IMODIUM) 2 MG capsule Take 2 mg by mouth as needed for diarrhea or loose stools.   Yes [provider]  methadone (DOLOPHINE) 10 MG tablet Take 10 mg by mouth every  8 (eight) hours.   Yes [provider]  metoprolol tartrate (LOPRESSOR) 25 MG tablet Take 25 mg by mouth 3 (three) times daily.   Yes [provider]  NUVIGIL 250 MG tablet TAKE 1 TABLET BY MOUTH TWICE DAILY. Patient taking differently: TAKE 1 TABLET DAILY 10/02/15  Yes Wardell Honour, MD  omeprazole (PRILOSEC) 20 MG capsule Take 1 capsule (20 mg total) by mouth daily. 01/26/16  Yes Jola Schmidt, MD  vitamin C (ASCORBIC ACID) 500 MG tablet Take 1,000 mg by mouth 2 (two) times daily.    Yes [provider]  cephALEXin (KEFLEX) 500 MG capsule Take 1 capsule (500 mg total) by mouth 4 (four) times daily. 04/29/16   Isla Pence, MD  nitroGLYCERIN (NITROSTAT) 0.3 MG SL tablet Place 0.4 mg under the tongue every 5 (five) minutes as needed for chest pain.    [provider]  traZODone (DESYREL) 50 MG tablet Take 1 tablet by mouth at bedtime as needed for sleep. 02/15/16   [provider]    Family History Family History  Problem Relation Age of Onset  . Alzheimer's disease Mother   . Colon cancer  Neg Hx     Social History Social History  Substance Use Topics  . Smoking status: Former Smoker    Packs/day: 0.00    Years: 40.00    Quit date: 08/15/2014  . Smokeless tobacco: Never Used     Comment: Counseling sheet to quit smoking given in exam room   . Alcohol use No     Comment: 11/22/2011 "like crazy til 1988; nothing since"     Allergies   Ultram [tramadol hcl]; Gabapentin; and Lyrica [pregabalin]   Review of Systems Review of Systems  Constitutional: Negative.   HENT: Negative.   Respiratory: Negative.   Cardiovascular: Negative.        Syncope  Gastrointestinal: Positive for abdominal pain, diarrhea and nausea.  Genitourinary:       Chronic indwelling Foley  Musculoskeletal: Positive for gait problem.       Bedbound  Skin: Negative.   Allergic/Immunologic: Negative.   Psychiatric/Behavioral: Negative.   All other systems reviewed and are negative.    Physical Exam Updated Vital Signs BP (!) 141/97 (BP Location: Left Arm)   Pulse 86   Temp 98.9 F (37.2 C) (Oral)   Resp (!) 22   Ht 5\' 8"  (1.727 m)   Wt 220 lb (99.8 kg)   SpO2 100%   BMI 33.45 kg/m   Physical Exam  Constitutional: No distress.  Chronically ill-appearing. Not acutely ill-appearing  HENT:  Head: Normocephalic and atraumatic.  Eyes: Conjunctivae are normal. Pupils are equal, round, and reactive to light.  Neck: Neck supple. No tracheal deviation present. No thyromegaly present.  Cardiovascular: Normal rate and regular rhythm.   No murmur heard. Pulmonary/Chest: Effort normal and breath sounds normal.  Abdominal: Soft. Bowel sounds are normal. He exhibits no distension. There is no tenderness.  Genitourinary: Penis normal.  Genitourinary Comments: Catheter in place  Musculoskeletal: Normal range of motion. He exhibits no edema or tenderness.  Neurological: He is alert. Coordination normal.  All 4 extremities with muscular atrophy. Right lower extremity without redness or swelling.  Tender over anterior knee.  Skin: Skin is warm and dry. No rash noted.  Psychiatric: He has a normal mood and affect.  Nursing note and vitals reviewed.    ED Treatments / Results  Labs (all labs ordered are listed, but only abnormal results are displayed)  Labs Reviewed  CBC WITH DIFFERENTIAL/PLATELET - Abnormal; Notable for the following:       Result Value   WBC 16.5 (*)    RBC 3.92 (*)    Hemoglobin 10.6 (*)    HCT 34.7 (*)    RDW 18.1 (*)    Neutro Abs 13.0 (*)    Monocytes Absolute 1.6 (*)    All other components within normal limits  BASIC METABOLIC PANEL    EKG  EKG Interpretation None       Radiology No results found.  Procedures Procedures (including critical care time)  Medications Ordered in ED Medications - No data to display X-rays viewed by me  Results for orders placed or performed during the hospital encounter of 08/18/16  CBC with Differential  Result Value Ref Range   WBC 16.5 (H) 4.0 - 10.5 K/uL   RBC 3.92 (L) 4.22 - 5.81 MIL/uL   Hemoglobin 10.6 (L) 13.0 - 17.0 g/dL   HCT 34.7 (L) 39.0 - 52.0 %   MCV 88.5 78.0 - 100.0 fL   MCH 27.0 26.0 - 34.0 pg   MCHC 30.5 30.0 - 36.0 g/dL   RDW 18.1 (H) 11.5 - 15.5 %   Platelets 304 150 - 400 K/uL   Neutrophils Relative % 79 %   Neutro Abs 13.0 (H) 1.7 - 7.7 K/uL   Lymphocytes Relative 10 %   Lymphs Abs 1.6 0.7 - 4.0 K/uL   Monocytes Relative 10 %   Monocytes Absolute 1.6 (H) 0.1 - 1.0 K/uL   Eosinophils Relative 1 %   Eosinophils Absolute 0.2 0.0 - 0.7 K/uL   Basophils Relative 0 %   Basophils Absolute 0.0 0.0 - 0.1 K/uL  Basic metabolic panel  Result Value Ref Range   Sodium 138 135 - 145 mmol/L   Potassium 4.1 3.5 - 5.1 mmol/L   Chloride 95 (L) 101 - 111 mmol/L   CO2 34 (H) 22 - 32 mmol/L   Glucose, Bld 86 65 - 99 mg/dL   BUN 15 6 - 20 mg/dL   Creatinine, Ser 1.18 0.61 - 1.24 mg/dL   Calcium 9.3 8.9 - 10.3 mg/dL   GFR calc non Af Amer >60 >60 mL/min   GFR calc Af Amer >60 >60 mL/min    Anion gap 9 5 - 15   Dg Abd Acute W/chest  Result Date: 08/18/2016 CLINICAL DATA:  Diarrhea and vomiting for 6 days. EXAM: DG ABDOMEN ACUTE W/ 1V CHEST COMPARISON:  Chest radiograph 07/08/2016 FINDINGS: The cardiomediastinal silhouette is unchanged and within normal limits. The lungs remain hyperinflated with underlying emphysematous changes and chronic interstitial coarsening, similar to the prior study. There is no evidence of acute airspace consolidation, edema, sizable pleural effusion, or pneumothorax. No acute osseous abnormality is seen. There is no evidence of intraperitoneal free air. Gas is present in nondilated loops of small and large bowel without evidence of obstruction. A small to moderate amount of stool is present in the colon. IMPRESSION: Negative abdominal radiographs. COPD without evidence of acute cardiopulmonary disease. Electronically Signed   By: Logan Bores M.D.   On: 08/18/2016 16:44    Initial Impression / Assessment and Plan / ED Course  I have reviewed the triage vital signs and the nursing notes.  Pertinent labs & imaging results that were available during my care of the patient were reviewed by me and considered in my medical decision making (see chart for details). Patient does not appear to be clinically dehydrated  4:50 PM patient states he is hungry.  Food given to patient. He was unable to produce a stool specimen while here. Plan avoid dairy. Encourage oral hydration. Continue Imodium as directed. Follow-up with PMD tomorrow anemia is chronic. Patient has mild leukocytosis but I feel he that he requires further imaging of his abdomen, as exam is benign patient is presently hungry and not ill-appearing Final Clinical Impressions(s) / ED Diagnoses  Diagnosis #1diarrhea #2 chronic knee pain Final diagnoses:  None    New Prescriptions New Prescriptions   No medications on file     Orlie Dakin, MD 08/18/16 [place], MD 08/18/16  [phone]

## 2016-08-18 NOTE — ED Notes (Signed)
[place] to give report. Placed on hold for nurse.

## 2016-08-18 NOTE — ED Notes (Signed)
Pt returned from [facility]

## 2016-08-18 NOTE — ED Notes (Signed)
Pt given crackers and drink. edp in and advisd to d/c but [facility] to collect sample.

## 2016-08-18 NOTE — Discharge Instructions (Signed)
Take Imodium as directed. Avoid milk or foods containing milk such as cheese or ice cream all having diarrhea. Make sure that you drink at least six 8 ounce glasses of water or Gatorade each day in order to stay well-hydrated. Take Imodium as directed for diarrhea. Asked to see your doctor tomorrow if you don't feel well or you can return if concern for any reason

## 2016-08-18 NOTE — ED Notes (Signed)
Pt taken to xray 

## 2016-08-18 NOTE — ED Notes (Signed)
Attempted x 4 to call report to [facility], was transferred several times and left on hold. Will give dc instructions to EMS to give to RN

## 2016-08-18 NOTE — ED Notes (Signed)
Attempted to contact [facility] twice with no success.

## 2016-08-18 NOTE — ED Notes (Signed)
Pt resting w/ eyes closed when entered the room. Pt aware we are waiting on transport to carry him back to facility. No needs voiced at this time.

## 2016-08-18 NOTE — ED Notes (Signed)
No bm since in ED

## 2016-08-18 NOTE — ED Triage Notes (Addendum)
Pt from [facility]. c/o diarrhea x 6 days. Used immodium with no relief. cbg in route 141. A/o

## 2016-08-18 NOTE — ED Notes (Signed)
Attempted to call report, was transferred w/ no answer.

## 2016-08-25 ENCOUNTER — Institutional Professional Consult (permissible substitution): Payer: Self-pay | Admitting: Internal Medicine

## 2016-09-02 ENCOUNTER — Institutional Professional Consult (permissible substitution): Payer: Self-pay | Admitting: Internal Medicine

## 2016-09-06 ENCOUNTER — Encounter: Payer: Self-pay | Admitting: Internal Medicine

## 2016-09-22 ENCOUNTER — Institutional Professional Consult (permissible substitution): Payer: Self-pay | Admitting: Internal Medicine

## 2016-10-13 ENCOUNTER — Ambulatory Visit (INDEPENDENT_AMBULATORY_CARE_PROVIDER_SITE_OTHER): Payer: Medicare Other | Admitting: Internal Medicine

## 2016-10-13 ENCOUNTER — Encounter: Payer: Self-pay | Admitting: Internal Medicine

## 2016-10-13 VITALS — BP 110/72 | HR 76 | Ht 68.0 in | Wt 230.0 lb

## 2016-10-13 DIAGNOSIS — I4892 Unspecified atrial flutter: Secondary | ICD-10-CM

## 2016-10-13 NOTE — Progress Notes (Signed)
ELECTROPHYSIOLOGY CONSULT NOTE  Patient ID: Dale Shields, MRN: [id number], DOB/AGE: 65-Oct-1952 65 y.o. Admit date: (Not on file) Date of Consult: 10/13/2016  Primary Physician: Dale Blitz, MD Primary Cardiologist: Dale Shields is being seen today for the evaluation of Atrial flutter at the request of Dale Shields   HPI Dale Shields is a 66 y.o. male  Referred for atrial flutter  He is unaware of how was at the atrial flutter was identified. Dale. Irven Shields notes described him presenting to [facility] 3/18 for worsening shortness of breath and found to be in atrial flutter. He was anticoagulated at that time. His functional status has improved. He has chronic edema. He has oxygen-dependent lung disease described. Emphysema as well as multiple sclerosis.   He has a history of coronary artery disease with stenting 8/13 of his proximal LAD and PCI of his RCA. He says that he has had stents 3.   Myoview scan 7/16 demonstrated no ischemia with an ejection fraction 68% Echocardiogram 4/18 poor window low-normal ejection fraction with mild concentric LVH.T   Date Cr Hgb  8/17 1.38 10.5  4/18 1.44 21.3   Thromboembolic risk factors ( age -66, HTN-1 , DM-1 , Vasc disease -1,) for a CHADSVASc Score of 4    Medical history is otherwise notable for necrotizing fasciitis involving his right and right chest  Past Medical History:  Diagnosis Date  . Anemia of chronic disease   . Anxiety   . Anxiety and depression   . BPH (benign prostatic hypertrophy)   . CAD (coronary artery disease) 11/2011   Stents x 3  . Carpal tunnel syndrome   . COPD (chronic obstructive pulmonary disease) ([place])   . DDD (degenerative disc disease)    knees, hips, shoulders--Dale. Ronnie Shields [facility] prior to this)  . Depression   . Dyspnea   . Endocarditis 2012  . Exertional dyspnea   . Fibromyalgia   . GERD (gastroesophageal reflux disease)   . H/O hiatal hernia   . Heart murmur   .  History of blood transfusion    "gave me too much coumadin and heparin once"  . History of epistaxis   . History of gout   . History of MRSA infection 2012   UTI, bacteremia, and endocarditis  . History of renal failure 2011   "on dialysis 3 times over 2 years"  . HLD (hyperlipidemia)   . Hydronephrosis   . Hypertension   . Hypothyroidism   . Insomnia   . Multiple sclerosis ([place])    with optic neuritis  . Myocardial infarction [facility])    "according to stress test; that's news to me"  . Narcolepsy   . Necrotizing fasciitis ([place])   . Neurogenic bladder    chronic indwelling foley catheter-changed monthly  . Optic neuritis   . OSA (obstructive sleep apnea)    "tried CPAP; lost more sleep w/use"  . Osteoarthritis of multiple joints    "everywhere"  . Pulmonary embolism ([place])    "suspected; never confirmed"--? treated with coumadin  . Scoliosis   . Steroid-induced psychosis   . Supraspinatus tendon tear 2012  . Thrombocytopenia [facility])       Surgical History:  Past Surgical History:  Procedure Laterality Date  . CARPAL TUNNEL RELEASE  ~ 2010   left with radius fracture repair  . COLONOSCOPY  2000; 2005; 03/15/2011   diminutve adenoma--recall 2017  . CORONARY ANGIOPLASTY  11/22/2011   3  vessels  . CORONARY ANGIOPLASTY WITH STENT PLACEMENT  11/22/2011   DES to LAD  . ESOPHAGOGASTRODUODENOSCOPY N/A 03/04/2016   Procedure: ESOPHAGOGASTRODUODENOSCOPY (EGD);  Surgeon: Dale Gunning, MD;  Location: [place];  Service: Gastroenterology;  Laterality: N/A;  . KNEE ARTHROSCOPY W/ MENISCAL REPAIR  1990's   right  . LEFT HEART CATHETERIZATION WITH CORONARY ANGIOGRAM N/A 11/22/2011   Procedure: LEFT HEART CATHETERIZATION WITH CORONARY ANGIOGRAM;  Surgeon: Dale Page, MD;  Location: [facility] CATH LAB;  Service: Cardiovascular;  Laterality: N/A;  . nasal cautery    . PERCUTANEOUS CORONARY STENT INTERVENTION (PCI-S)  11/22/2011   Procedure: PERCUTANEOUS CORONARY STENT INTERVENTION  (PCI-S);  Surgeon: Dale Page, MD;  Location: [facility] CATH LAB;  Service: Cardiovascular;;  . SKIN GRAFT  04/2009   right thigh to right "wrist all the way to my shoulder; necrotizing fasciitis     Home Meds: Prior to Admission medications   Medication Sig Start Date End Date Taking? Authorizing Provider  acetaminophen (TYLENOL) 650 MG CR tablet Take 650 mg by mouth every 4 (four) hours as needed for pain.   Yes [provider]  albuterol (PROVENTIL HFA;VENTOLIN HFA) 108 (90 Base) MCG/ACT inhaler Inhale 2 puffs into the lungs as needed for wheezing or shortness of breath.   Yes [provider]  apixaban (ELIQUIS) 5 MG TABS tablet Take 5 mg by mouth 2 (two) times daily.   Yes [provider]  ARIPiprazole (ABILIFY) 2 MG tablet Take 1 tablet (2 mg total) by mouth daily. Patient taking differently: Take 5 mg by mouth daily.  02/18/16 02/17/17 Yes Shields, Dale Goody, MD  atorvastatin (LIPITOR) 10 MG tablet Take 10 mg by mouth daily.   Yes [provider]  cephALEXin (KEFLEX) 500 MG capsule Take 1 capsule (500 mg total) by mouth 4 (four) times daily. 04/29/16  Yes Dale Pence, MD  digoxin (LANOXIN) 0.125 MG tablet Take 0.125 mg by mouth daily.   Yes [provider]  diltiazem (CARDIZEM LA) 180 MG 24 hr tablet Take 180 mg by mouth daily.    Yes [provider]  DULoxetine (CYMBALTA) 60 MG capsule TAKE (1) CAPSULE BY MOUTH TWICE DAILY. 02/18/16  Yes Dale Clay, MD  ergocalciferol (VITAMIN D2) 50000 units capsule Take 50,000 Units by mouth once a week.   Yes [provider]  eszopiclone (LUNESTA) 1 MG TABS tablet Take 1 mg by mouth at bedtime. Take immediately before bedtime   Yes [provider]  ferrous sulfate 325 (65 FE) MG EC tablet Take 325 mg by mouth daily with breakfast.   Yes [provider]  Fluticasone-Salmeterol (ADVAIR) 250-50 MCG/DOSE AEPB Inhale 1 puff into the lungs 2 (two) times daily as needed.    Yes  [provider]  levalbuterol (XOPENEX) 1.25 MG/3ML nebulizer solution Take 1.25 mg by nebulization every 6 (six) hours as needed for wheezing.   Yes [provider]  loperamide (IMODIUM) 2 MG capsule Take 2 mg by mouth as needed for diarrhea or loose stools.   Yes [provider]  methadone (DOLOPHINE) 10 MG tablet Take 10 mg by mouth every 8 (eight) hours.   Yes [provider]  metoprolol tartrate (LOPRESSOR) 25 MG tablet Take 25 mg by mouth 3 (three) times daily.   Yes [provider]  nitroGLYCERIN (NITROSTAT) 0.3 MG SL tablet Place 0.4 mg under the tongue every 5 (five) minutes as needed for chest pain.   Yes [provider]  traZODone (DESYREL) 50 MG tablet  Take 1 tablet by mouth at bedtime as needed for sleep. 02/15/16  Yes [provider]  vitamin C (ASCORBIC ACID) 500 MG tablet Take 1,000 mg by mouth 2 (two) times daily.    Yes [provider]    Allergies:  Allergies  Allergen Reactions  . Ultram [Tramadol Hcl] Other (See Comments)    Seizures; "grand mal; twice"  . Gabapentin Other (See Comments)    Made pt dilusional  . Lyrica [Pregabalin] Other (See Comments)    Leg swelling.    Social History   Social History  . Marital status: Single    Spouse name: N/A  . Number of children: N/A  . Years of education: N/A   Occupational History  . Not on file.   Social History Main Topics  . Smoking status: Former Smoker    Packs/day: 0.00    Years: 40.00    Quit date: 08/15/2014  . Smokeless tobacco: Never Used     Comment: Counseling sheet to quit smoking given in exam room   . Alcohol use No     Comment: 11/22/2011 "like crazy til 1988; nothing since"  . Drug use: No     Comment: *8//13/13 "1986 til 1988 like crazy; nothing since"  . Sexual activity: Not Currently   Other Topics Concern  . Not on file   Social History Narrative   Never married, no children.   Orig from [place], [place], lived a long  time in [place].   Has been in [place] since about 2004.   College at [employer] "for years".   Denies alcohol or drug use.   Cigarettes: 50 pack-yr hx--current as of 03/2013.     Family History  Problem Relation Age of Onset  . Alzheimer's disease Mother   . Colon cancer Neg Hx      ROS:  Please see the history of present illness.     All other systems reviewed and negative.    Physical Exam:  Blood pressure 110/72, pulse 76, height 5\' 8"  (1.727 m), weight 230 lb (104.3 kg), SpO2 98 %. General: Well developed, well nourished male in no acute distress. Head: Normocephalic, atraumatic, sclera non-icteric, no xanthomas, nares are without discharge. EENT: normal  Lymph Nodes:  none Neck: Negative for carotid bruits. JVD 8-10 Back:without scoliosis kyphosis  Lungs: Clear bilaterally to auscultation without wheezes, rales, or rhonchi. Breathing is unlabored. Heart: RRR with S1 S2.  2/6 systolic  Murmur at RLSB . No rubs, or gallops appreciated. Abdomen: Soft, non-tender, non-distended with normoactive bowel sounds. No hepatomegaly. No rebound/guarding. No obvious abdominal masses. Msk:  Strength and tone appear normal for age. Extremities: No clubbing or cyanosis.  1+  edema.  Distal pedal pulses are 2+ and equal bilaterally. Skin: Warm and Dry Neuro: Alert and oriented X 3. CN III-XII intact Grossly normal sensory and motor function . Psych:  Responds to questions appropriately with a normal affect.      Labs: Cardiac Enzymes No results for input(s): CKTOTAL, CKMB, TROPONINI in the last 72 hours. CBC Lab Results  Component Value Date   WBC 16.5 (H) 08/18/2016   HGB 10.6 (L) 08/18/2016   HCT 34.7 (L) 08/18/2016   MCV 88.5 08/18/2016   PLT 304 08/18/2016   PROTIME: No results for input(s): LABPROT, INR in the last 72 hours. Chemistry No results for input(s): NA, K, CL, CO2, BUN, CREATININE, CALCIUM, PROT, BILITOT, ALKPHOS, ALT, AST, GLUCOSE in the last 168 hours.  Invalid  input(s): LABALBU Lipids Lab  Results  Component Value Date   CHOL 111 09/29/2010   HDL 17 (L) 09/29/2010   LDLCALC 68 09/29/2010   TRIG 130 09/29/2010   BNP Pro B Natriuretic peptide (BNP)  Date/Time Value Ref Range Status  09/29/2010 06:26 PM 3606.0 (H) 0 - 125 pg/mL Final  08/25/2009 05:25 AM 2915.0 (H) 0.0 - 100.0 pg/mL Final  05/05/2009 06:30 AM 237.0 (H) 0.0 - 100.0 pg/mL Final   Thyroid Function Tests: No results for input(s): TSH, T4TOTAL, T3FREE, THYROIDAB in the last 72 hours.  Invalid input(s): FREET3 Miscellaneous Lab Results  Component Value Date   DDIMER 0.66 (H) 03/01/2016   Chest Xray personally reviewed >>Emphysema   Radiology/Studies:  No results found.  EKG: Atrial flutter cycle length 250 ms  Variable conduction with ventricular response of 76 Intervals-/11/38 Rare PVC  Assessment and Plan:   Atrial flutter-typical  Ischemic heart disease with prior stenting  Multiple sclerosis  Necrotizing fasciitis involving right arm and right chest  Oxygen-dependent COPD  Multiple sclerosis  Anemia-iron deficient   The patient has atrial flutter. This is a candidate ablation as primary therapy. However, his lung issues prompt further evaluation prior to elective general anesthesia. Hence, I would recommend first that he undergo cardioversion. Then, pulmonary evaluation to give direction as to whether he would be a candidate for prolonged procedure with intubation or should be done with conscious sedation. He has been well managed and his acute decompensation from 3/18 seems largely reversed. The likelihood of atrial fibrillation is quite high; we don't have measurements of left atrial size but given his lung disease I am sure the stresses on his heart are significant and atrial fibrillation would also require anticoagulation which, in the absence of symptoms attributable to his atrial flutter, is the main reason to pursue an ablation procedure. Given his iron  deficiency anemia ability to discontinue his anticoagulation is also attractive  Every Dale. Dr. Einar Gip but I have not heard from him       Virl Axe

## 2016-10-13 NOTE — Patient Instructions (Addendum)
Medication Instructions: - Your physician recommends that you continue on your current medications as directed. Please refer to the Current Medication list given to you today.  Labwork: - none ordered  Procedures/Testing: - none ordered  Follow-Up: - pending follow up with Dr. Nadyne Coombes  Any Additional Special Instructions Will Be Listed Below (If Applicable).     If you need a refill on your cardiac medications before your next appointment, please call your pharmacy.

## 2016-10-14 ENCOUNTER — Telehealth: Payer: Self-pay | Admitting: Internal Medicine

## 2016-10-14 NOTE — Telephone Encounter (Signed)
I called and spoke with Dale Shields at [facility] center. She states she could not read what was sent back on the patient from Dr. Caryl Comes. I advised I did not see what he wrote as I was unaware the patient had any paperwork with him. I did notify Dale Shields that Dr. Caryl Comes is pending a conversation with Dr. Nadyne Coombes regarding the patient and what the next steps should be for him, but we did not make any changes yesterday. Copy of the patient's office note faxed to [facility] to review with the patient's doctor at [facility]. [phone] fax- confirmation received.

## 2016-10-14 NOTE — Telephone Encounter (Signed)
Lorenza Cambridge calling from [facility], states that she is having a hard time reading the handwriting from the notes. Please call to discuss, thanks.

## 2016-11-07 DIAGNOSIS — I4892 Unspecified atrial flutter: Secondary | ICD-10-CM | POA: Diagnosis present

## 2016-11-07 NOTE — H&P (Addendum)
OFFICE VISIT NOTES COPIED TO EPIC FOR DOCUMENTATION  . History of Present Illness Dale Page MD; 08/17/2016 1:06 PM) Patient words: 3 week f/u for afib; last office visit 07/22/2016.  The patient is a 66 year old male who presents for a follow-up for CAD / angina. [name] is a pleasant Caucasian male with history of CAD, (11/22/11: Stent Proximal LAD with 3.5x12 Xience, D1 Scoring balloon PTCA with 3x6 mm PTCA. 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  Patient presents for hospital follow-up. Patient was admitted to [facility] on 07/08/2016 for worsening shortness of breath. Patient was found to have acute exacerbation of diastolic heart failure and new onset Atrial flutter. Patient was started on anticoagulation and was rate controlled with calcium channel blocker and beta blocker. This patient was diuresed and had remained stable, he was discharged home to [facility] for continued rehabilitation. He now presents for follow-up. Patient is presently doing well, shortness of breath is now stable and denies PND or orthopnea. He is now on continuous oxygen.  Additional reasons for visit:  Follow-up for Atrial flutter    Problem List/Past Medical Dale Page, MD; 08/17/2016 11:43 AM) Atherosclerosis of native coronary artery of native heart without angina pectoris (I25.10)  CAD/ASHD H/O Heart cath 11/22/11: Stent Proximal LAD with 3.5x12 Xience, D1 Scoring balloon PTCA with 3x6 mm PTCA. Dist RCA PTCA 3x6 mm Scoring balloon angioplasty. Lexiscan myoview stress test 11/03/2014: 1. The resting electrocardiogram demonstrated normal sinus rhythm, normal resting conduction, no resting arrhythmias and normal rest repolarization. Stress EKG is  non-diagnostic for ischemia as it a pharmacologic stress using Lexiscan. Stress symptoms included dyspnea. 2. Gated SPECT images reveal normal myocardial thickening and wall motion. Gated SPECT images reveal normal myocardial thickening and wall motion. The left ventricular ejection fraction was calculated or visually estimated to be 68%. This is a low risk study. Percutaneous transluminal coronary angioplasty status (Z98.61)  CAD/ASHD H/O Heart cath 11/22/11: Stent Proximal LAD with 3.5x12 Xience, D1 Scoring balloon PTCA with 3x6 mm PTCA. Dist RCA PTCA 3x6 mm Scoring balloon angioplasty. Echo 11/10/11: Normal LVEF. Mild LVH. No right atrial mass or vegetations noted.. no significant change from Echocardiogram done 12/23/10 revealed normal LVEF, no abnormal masses in the atriae, no significant valvular abnormalities. Lexiscan stress 10/28/11: Mod sized mod to severe anterior, apical ischemia. EF 62%. Abnormal stress. Benign essential hypertension (I10)  History of tobacco use (Z87.891)  Shortness of breath on exertion (R06.02)  Echocardiogram 08/04/2016: Poor echo window. Wall motion abnormality has reduced sensitivity. Left ventricle cavity is normal in size. Mild concentric hypertrophy of the left ventricle. Mild diffuse hypokinesis, Low normal LVEF. Unable to evaluate diastolic function due to A. Fibrillation. Calculated EF 50%. Mild to moderate mitral regurgitation. Mild tricuspid regurgitation. No evidence of pulmonary hypertension. An incidental PFO is probably present. Compared to the study done on 11/10/2011, mild to moderate MR is new. Obstructive emphysema (J43.8)  Pure hypercholesterolemia (E78.00)  Tobacco use disorder (F17.200)  History of chronic back pain (Z87.39)  Labwork  07/19/2016: RBC 3.67, hemoglobin 9.4, hematocrit 30.2, MCH 25.6, MCHC 31.1, RDW 18.6, CBC otherwise normal. Potassium 4.1, Creatinine 1.68, eGFR 42, CMP otherwise normal. Cholesterol 190, triglycerides 160, HDL 55, LDL  103. TSH 1.9. Vitamin D 20.6. Vitamin B12 1287. Labs  11/27/2015: Total cholesterol 176, triglycerides 65, HDL 77, LDL 86. Serum glucose 110, BUN 32, serum creatinine 1.38, eGFR 54 mL, potassium 4.1. HB 10.5/HCT 33.8, normal indicis, platelets 313. TSH normal. Atrial flutter with controlled response (I48.92)  Urethral urinary catheter in situ for long term use (Z96.0)  Neurogenic bladder due to spinal stenosis  Allergies (Dale Shields; 2016-08-21 10:26 AM) Ultram *ANALGESICS - OPIOID*  Unconsciousness, Anaphylaxis. Lyrica *ANTICONVULSANTS*  Neurontin *ANTICONVULSANTS*   Family History (Dale Shields; Aug 21, 2016 10:26 AM) Father died at age 37 of unknown. Mother is 64, has dementia. 2 sisters in good health.   Social History Dale Shields; 08-21-16 10:26 AM) Current tobacco use  Former smoker. quit in 07/2014. Non Drinker/No Alcohol Use  Marital status  Single. Living Situation  Lives alone. Number of Children  0.  Past Surgical History (Dale Shields; 08-21-16 10:26 AM) H/O MRSA bacteremia and urosepsis from necrotising fascitis and is S/P skin graft to his right forearm and neck.  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  66 years old, paralized from waist down.  Rt knee surgery 2001 and 2006.  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   Medication History (Dale Shields; Aug 21, 2016 11:01 AM) Abilify (5MG Tablet, 1 Oral daily) Active. Atorvastatin Calcium (10MG Tablet, 1 (one) Tablet Tablet Tablet Oral daily, Taken starting 09/09/2015) Active. DilTIAZem CD (120MG Capsule ER 24HR, 1 Oral daily) Active. Nuvigil (250MG Tablet, 1 Oral every morning) Active. Digox (125MCG  Tablet, 1 Oral daily) Active. Vitamin D (Ergocalciferol) (50000UNIT Capsule, 1 Oral every Friday) Active. Advair Diskus (250-50MCG/DOSE Aero Pow Br Act, 1 puff Inhalation every twelve hours) Active. Cymbalta (60MG Capsule DR Part, 1 Oral two times daily) Active. Eliquis (5MG Tablet, 1 Oral two times daily) Active. Omeprazole (20MG Capsule DR, 1 Oral two times daily) Active. Lopressor (50MG Tablet, 1/2 Oral three times daily) Active. Levalbuterol HCl (1.25MG/3ML Nebulized Soln, Inhalation q6h) Active. TraZODone HCl (50MG Tablet, 1 Oral at bedtime as needed for sleep) Active. Eszopiclone (1MG Tablet, 1 Oral at bedtime) Active. ALPRAZolam (0.25MG Tablet, 1 Oral daily) Active. KlonoPIN (0.5MG Tablet, 1 Oral two times daily) Active. Vitamin C (500MG Tablet, 1 Oral two times daily) Active. Nitrostat (0.4MG Tab Sublingual, 1 (one) Tab Sublingual Tab Sub Sublingual every 5 minutes as needed for chest pain., Taken starting 10/23/2014) Active. Proair HFA 1-2 puffs TID PRN Active. Milk of Magnesia (30CC Oral at bedtime as needed for constipation) Active. Methadone HCl (10MG Tablet, 1 Oral every 8 hours) Active. Iron (325 (65 Fe)MG Tablet, 1 Oral daily) Active. Medications Reconciled (List from [facility])  Diagnostic Studies History Dale Shields; Aug 21, 2016 10:48 AM) Echocardiogram [08/04/2016]: Poor echo window. Wall motion abnormality has reduced sensitivity. Left ventricle cavity is normal in size. Mild concentric hypertrophy of the left ventricle. Mild diffuse hypokinesis, Low normal LVEF. Unable to evaluate diastolic function due to A. Fibrillation. Calculated EF 50%. Mild to moderate mitral regurgitation. Mild tricuspid regurgitation. No evidence of pulmonary hypertension. An incidental PFO is probably present. Compared to the study done on 11/10/2011, mild to moderate MR is new.  Health Maintenance History Dale Shields; Aug 21, 2016 10:26 AM) Endoscopy and  Colonoscopy 2006. Normal     Review of Systems Dale Page, MD; Aug 21, 2016  1:8 PM) General Present- Feeling well. Not Present- Fatigue, Tiredness and Weight Gain > 10lbs.Marland Kitchen HEENT Not Present- Blurred Vision. Respiratory Present- Chronic Cough, Decreased Exercise Tolerance and Difficulty Breathing on Exertion. Not Present- Hemoptysis and Wakes up from Sleep Wheezing or Short of Breath. Cardiovascular Present- Heart Stent. Not Present- Claudications, Difficulty Breathing Lying Down, Edema and Leg Cramps. Male Genitourinary Present- Hesitancy (has chronic indwelling catheter). Musculoskeletal Present- Back Pain (severe degenerative disk disease and scoliosis), Decreased Range of Motion, Joint Pain (right knee. Wants to have knee replacement), Leg Weakness (wheel chair use and barely walks with a cane.) and Physical Disability. Not Present- Fasciculations and Muscle Atrophy. Neurological Not Present- Dizziness, Dysarthria, Fainting and Incontinence Urine. Endocrine Not Present- Appetite Changes, Cold Intolerance, Heat Intolerance and Polydipsia. Hematology Not Present- Anemia, Easy Bleeding and Nose Bleed.  Vitals Dale Shields; 08/17/2016 11:04 AM) 08/17/2016 10:44 AM Weight: 215.13 lb Height: 70in Body Surface Area: 2.15 m Body Mass Index: 30.87 kg/m  Pulse: 82 (Regular)  P.OX: 99% (Room air) BP: 159/80 (Sitting, Left Arm, Standard)       Physical Exam Dale Page MD; 08/17/2016 1:08 PM) General General Appearance-Cooperative and Appears older than stated age. Build & Nutrition-Well built and Mildly obese.  Harold, well-nourished and in no acute distress; alert and oriented x 3(Deformed right upper chest from prior necrotising fascitis. Has right thigh skin graft excision site noted.).  Head and Neck Thyroid Gland Characteristics - no palpable nodules, no palpable enlargement.  Chest and Lung  Exam Inspection Shape - Barrel-shaped. Palpation Tender - No chest wall tenderness. Auscultation Adventitious sounds - Rales - Both Lung Fields(faint crackles bilateral, improved from prior exam.).  Cardiovascular Auscultation Rhythm - Irregularly irregular. Heart Sounds - S1 is variable, S2 WNL and No gallop present. Murmurs & Other Heart Sounds - Murmur - No murmur.  Abdomen Palpation/Percussion Normal exam - Non Tender and No hepatosplenomegaly. Auscultation Normal exam - Bowel sounds normal.  Peripheral Vascular Lower Extremity Inspection - Left - No Pigmentation, No Varicose veins. Right - No Pigmentation, No Varicose veins. Palpation - Temperature - Bilateral - Normal. Edema - Bilateral - 1+ Pitting edema. Femoral pulse - Bilateral - Normal. Popliteal pulse - Bilateral - Feeble. Dorsalis pedis pulse - Bilateral - Feeble. Posterior tibial pulse - Bilateral - Absent. Carotid arteries - Bilateral-No Carotid bruit. Abdomen-No prominent abdominal aortic pulsation, No epigastric bruit.  Neurologic Motor-Grossly intact without any focal deficits.  Musculoskeletal - Did not examine.    Assessment & Plan Dale Page MD; 08/17/2016 1:10 PM) Atrial flutter with controlled response (I48.92) CHA2DS2-VASCScore: Risk Score 2,  Yearly risk of stroke  2.2. Recommendation: ASA No/Anticoagulation Yes  Impression: EKG 08/18/2016: Atrial flutter with variable ventricular response, and to tolerate 84 bpm, normal axis. Nonspecific T-wave abnormality. PCP. No significant change from EKG/13/2018. Current Plans Complete electrocardiogram (93000) Atherosclerosis of native coronary artery of native heart without angina pectoris (I25.10) Story: CAD/ASHD H/O Heart cath 11/22/11: Stent Proximal LAD with 3.5x12 Xience, D1 Scoring balloon PTCA with 3x6 mm PTCA. Dist RCA PTCA 3x6 mm Scoring balloon angioplasty.  Lexiscan myoview stress test 11/03/2014: 1. The resting electrocardiogram  demonstrated normal sinus rhythm, normal resting conduction, no resting arrhythmias and normal rest repolarization. Stress EKG is non-diagnostic for ischemia as it a pharmacologic stress using Lexiscan. Stress symptoms included dyspnea. 2. Gated SPECT images reveal normal myocardial thickening and wall motion. Gated SPECT images reveal normal myocardial thickening and wall motion. The left ventricular ejection fraction  was calculated or visually estimated to be 68%. This is a low risk study. Impression: EKG 8/18/201; normal sinus rhythm at rate of 73 bpm, left atrial abnormality. Otherwise normal EKG.  EKG 10/22/2013: Sinus rhythm with borderline first-degree AV block at the rate of 75 bpm, normal axis, incomplete right bundle branch block. Ventricular couplets. Nonspecific ST-T wave abnormality. No significant change from EKG 07/08/2013. Shortness of breath on exertion (R06.02) Story: Echocardiogram 08/04/2016: Poor echo window. Wall motion abnormality has reduced sensitivity. Left ventricle cavity is normal in size. Mild concentric hypertrophy of the left ventricle. Mild diffuse hypokinesis, Low normal LVEF. Unable to evaluate diastolic function due to A. Fibrillation. Calculated EF 50%. Mild to moderate mitral regurgitation. Mild tricuspid regurgitation. No evidence of pulmonary hypertension. An incidental PFO is probably present. Compared to the study done on 11/10/2011, mild to moderate MR is new. Benign essential hypertension (I10) Current Plans Discontinued DilTIAZem CD 120MG (Increase to 180). Started DilTIAZem CD 180MG, 1 (one) Capsule daily, #90, 08/17/2016, Ref. x1. Labwork Story: Labs 11/01/2016: BUN 17, creatinine 1.26, EGFR 49 mL, potassium 4.1.   Labs 08/06/2016: HB 10.2/HCT 32.6, MCH 26, MCHC 31, RDW 18. Microcytic indicis. Platelets 261. Serum glucose 99 mg, BUN 21, creatinine 1.29, eGFR 48 mL. Potassium 4.2. TIBC normal, iron normal, lower limit. Iron saturation reduced, lower limit  of normal. Serum ferritin reduced. Reticulocyte count normal.  07/19/2016: RBC 3.67, hemoglobin 9.4, hematocrit 30.2, MCH 25.6, MCHC 31.1, RDW 18.6, CBC otherwise normal. Potassium 4.1, Creatinine 1.68, eGFR 42, CMP otherwise normal. Cholesterol 190, triglycerides 160, HDL 55, LDL 103. TSH 1.9. Vitamin D 20.6. Vitamin B12 1287.  Labs 11/27/2015: Total cholesterol 176, triglycerides 65, HDL 77, LDL 86. Serum glucose 110, BUN 32, serum creatinine 1.38, eGFR 54 mL, potassium 4.1. HB 10.5/HCT 33.8, normal indicis, platelets 313. TSH normal. History of chronic back pain (Z87.39) Current Plans Mechanism of underlying disease process and action of medications discussed with the patient. I discussed primary/secondary prevention and also dietary counceling was done. He is here on a three-week follow-up and evaluation of new onset of atrial flutter, rate remains well controlled. Blood pressure is elevated for the 1st time today, I'll increase the diltiazem dose from 120-180 mg daily.  He does have mild chronic anemia and has a chronic bladder catheter due to incontinence. Iron has been replaced, I would prefer to have him be evaluated by EP for consideration of atrial flutter ablation, atrial flutter appears to be typical atrial flutter and can potentially come off of anticoagulation and hence I did not set him up for cardioversion.  From cardiac standpoint he remained stable. No clinical evidence of congestive heart failure, no recurrence of angina pectoris. Dyspnea remained stable and he does have severe COPD, remains abstinent from tobacco for the past 3 years. Doesn't chronic back pain and incontinence is worse due to spinal stenosis. Patient on chronic pain medications. I'll see him back in 6 weeks for follow-up.  CC: Dr. Vincente Liberty; EP Consult  Addendum Sunday Spillers MD; 10/13/2016 5:12 PM) Discussed with Dr. Virl Axe, electrophysiology. Patient is a good candidate for atrial  flutter ablation from EP standpoint however is concerned about underlying COPD and long history of tobacco use disorder. Would like to obtain pulmonary consultation to see whether he can withstand general anesthesia for 2 hours or more. In the interim to proceed with setting him up for direct current cardioversion. If after evaluation by pulmonary medicine, they feel it is safe for [name] to proceed with  A. Flutter ablation, he'll be a very good candidate.  Signed by Dale Page, MD (08/17/2016 1:10 PM)  Addendum: D/W Dr. Olin Pia. He would want me to proceed with direct current cardioversion to see if patient would maintain sinus and also plans on pulmonary evaluation for pre procedure pulmonary risk stratification for prolonged anesthesia for ablation. D/W patient and he is willing.   Blood pressure 139/69, pulse 74, temperature 98.6 F (37 C), temperature source Oral, resp. rate 14, height _0  (1.753 m), weight 101.6 kg (224 lb), SpO2 97 %. Physical Exam  Constitutional: He is oriented to person, place, and time. He appears well-developed.  Eyes: EOM are normal.  Neck: Neck supple.  Cardiovascular: Normal rate.  Exam reveals no gallop and no friction rub.   No murmur heard. Pulmonary/Chest: Effort normal and breath sounds normal.  Abdominal: Soft. Bowel sounds are normal.  Musculoskeletal: Normal range of motion.  Neurological: He is alert and oriented to person, place, and time.  Skin: Skin is warm.  Psychiatric: He has a normal mood and affect.

## 2016-11-08 ENCOUNTER — Ambulatory Visit (HOSPITAL_COMMUNITY): Payer: No Typology Code available for payment source | Admitting: Certified Registered Nurse Anesthetist

## 2016-11-08 ENCOUNTER — Encounter (HOSPITAL_COMMUNITY): Payer: Self-pay | Admitting: Certified Registered Nurse Anesthetist

## 2016-11-08 ENCOUNTER — Encounter (HOSPITAL_COMMUNITY)
Admission: RE | Disposition: A | Discharge status: Home / Self Care | Payer: Self-pay | Source: Ambulatory Visit | Attending: Cardiology

## 2016-11-08 ENCOUNTER — Ambulatory Visit (HOSPITAL_COMMUNITY)
Admission: RE | Admit: 2016-11-08 | Discharge: 2016-11-08 | Disposition: A | Discharge status: Home / Self Care | Payer: No Typology Code available for payment source | Source: Ambulatory Visit | Attending: Cardiology | Admitting: Cardiology

## 2016-11-08 DIAGNOSIS — Z8614 Personal history of Methicillin resistant Staphylococcus aureus infection: Secondary | ICD-10-CM | POA: Insufficient documentation

## 2016-11-08 DIAGNOSIS — G35 Multiple sclerosis: Secondary | ICD-10-CM | POA: Insufficient documentation

## 2016-11-08 DIAGNOSIS — Z955 Presence of coronary angioplasty implant and graft: Secondary | ICD-10-CM | POA: Diagnosis not present

## 2016-11-08 DIAGNOSIS — Z7951 Long term (current) use of inhaled steroids: Secondary | ICD-10-CM | POA: Diagnosis not present

## 2016-11-08 DIAGNOSIS — Z87891 Personal history of nicotine dependence: Secondary | ICD-10-CM | POA: Insufficient documentation

## 2016-11-08 DIAGNOSIS — R32 Unspecified urinary incontinence: Secondary | ICD-10-CM | POA: Insufficient documentation

## 2016-11-08 DIAGNOSIS — M549 Dorsalgia, unspecified: Secondary | ICD-10-CM | POA: Insufficient documentation

## 2016-11-08 DIAGNOSIS — I10 Essential (primary) hypertension: Secondary | ICD-10-CM | POA: Diagnosis not present

## 2016-11-08 DIAGNOSIS — I4891 Unspecified atrial fibrillation: Secondary | ICD-10-CM | POA: Diagnosis not present

## 2016-11-08 DIAGNOSIS — J449 Chronic obstructive pulmonary disease, unspecified: Secondary | ICD-10-CM | POA: Insufficient documentation

## 2016-11-08 DIAGNOSIS — E7800 Pure hypercholesterolemia, unspecified: Secondary | ICD-10-CM | POA: Diagnosis not present

## 2016-11-08 DIAGNOSIS — F419 Anxiety disorder, unspecified: Secondary | ICD-10-CM | POA: Insufficient documentation

## 2016-11-08 DIAGNOSIS — D649 Anemia, unspecified: Secondary | ICD-10-CM | POA: Insufficient documentation

## 2016-11-08 DIAGNOSIS — I4892 Unspecified atrial flutter: Secondary | ICD-10-CM | POA: Insufficient documentation

## 2016-11-08 DIAGNOSIS — F209 Schizophrenia, unspecified: Secondary | ICD-10-CM | POA: Diagnosis not present

## 2016-11-08 DIAGNOSIS — M797 Fibromyalgia: Secondary | ICD-10-CM | POA: Insufficient documentation

## 2016-11-08 DIAGNOSIS — I252 Old myocardial infarction: Secondary | ICD-10-CM | POA: Insufficient documentation

## 2016-11-08 DIAGNOSIS — E039 Hypothyroidism, unspecified: Secondary | ICD-10-CM | POA: Diagnosis not present

## 2016-11-08 DIAGNOSIS — Z79899 Other long term (current) drug therapy: Secondary | ICD-10-CM | POA: Diagnosis not present

## 2016-11-08 DIAGNOSIS — G8929 Other chronic pain: Secondary | ICD-10-CM | POA: Diagnosis not present

## 2016-11-08 DIAGNOSIS — K219 Gastro-esophageal reflux disease without esophagitis: Secondary | ICD-10-CM | POA: Insufficient documentation

## 2016-11-08 DIAGNOSIS — I2510 Atherosclerotic heart disease of native coronary artery without angina pectoris: Secondary | ICD-10-CM | POA: Insufficient documentation

## 2016-11-08 DIAGNOSIS — Z7901 Long term (current) use of anticoagulants: Secondary | ICD-10-CM | POA: Diagnosis not present

## 2016-11-08 HISTORY — PX: CARDIOVERSION: SHX1299

## 2016-11-08 SURGERY — CARDIOVERSION
Anesthesia: General

## 2016-11-08 MED ORDER — LIDOCAINE HCL (CARDIAC) 20 MG/ML IV SOLN
INTRAVENOUS | Status: DC | PRN
  Administered 2016-11-08: 60 mg via INTRAVENOUS

## 2016-11-08 MED ORDER — LACTATED RINGERS IV SOLN
INTRAVENOUS | Status: DC
  Administered 2016-11-08: 13:00:00 via INTRAVENOUS

## 2016-11-08 MED ORDER — PROPOFOL 10 MG/ML IV BOLUS
INTRAVENOUS | Status: DC | PRN
  Administered 2016-11-08: 40 mg via INTRAVENOUS

## 2016-11-08 NOTE — Progress Notes (Signed)
Gave report Lodema Pilot, LPN.  Discussed results from cardioversion, when to call 911 or the cardiologist, and how to treat patient if he develops any irritation from pads.  Discharge instructions also sent out with patient.

## 2016-11-08 NOTE — Anesthesia Postprocedure Evaluation (Signed)
Anesthesia Post Note  Patient: Dale Shields  Procedure(s) Performed: Procedure(s) (LRB): CARDIOVERSION (N/A)     Patient location during evaluation: PACU Anesthesia Type: General Level of consciousness: awake and alert Pain management: pain level controlled Vital Signs Assessment: post-procedure vital signs reviewed and stable Respiratory status: spontaneous breathing, nonlabored ventilation, respiratory function stable and patient connected to nasal cannula oxygen Cardiovascular status: blood pressure returned to baseline and stable Postop Assessment: no signs of nausea or vomiting Anesthetic complications: no    Last Vitals:  Vitals:   11/08/16 1420 11/08/16 1433  BP: (!) 110/49 (!) 136/57  Pulse: 69 78  Resp: 11 16  Temp: 36.8 C     Last Pain:  Vitals:   11/08/16 1420  TempSrc: Oral                 Ryan P Ellender

## 2016-11-08 NOTE — CV Procedure (Addendum)
Direct current cardioversion:  Indication symptomatic A. Flutter  Procedure: Using 40 mg of IV Propofol and 60 IV Lidocaine (for reducing venous pain) for achieving deep sedation, synchronized direct current cardioversion performed. Patient was delivered with 50 Joules of electricity X 1 with success to NSR. Patient tolerated the procedure well. No immediate complication noted.

## 2016-11-08 NOTE — Interval H&P Note (Signed)
History and Physical Interval Note:  11/08/2016 2:05 PM  Dale Shields  has presented today for surgery, with the diagnosis of AFLUTTER  The various methods of treatment have been discussed with the patient and family. After consideration of risks, benefits and other options for treatment, the patient has consented to  Procedure(s): CARDIOVERSION (N/A) as a surgical intervention .  The patient's history has been reviewed, patient examined, no change in status, stable for surgery.  I have reviewed the patient's chart and labs.  Questions were answered to the patient's satisfaction.     Adrian Prows

## 2016-11-08 NOTE — Anesthesia Preprocedure Evaluation (Signed)
Anesthesia Evaluation  Patient identified by MRN, date of birth, ID band Patient awake    Reviewed: Allergy & Precautions, NPO status , Patient's Chart, lab work & pertinent test results  Airway Mallampati: II  TM Distance: >3 FB Neck ROM: Full    Dental no notable dental hx.    Pulmonary sleep apnea , COPD, former smoker, PE   Pulmonary exam normal breath sounds clear to auscultation       Cardiovascular hypertension, Pt. on home beta blockers + CAD, + Past MI and + Cardiac Stents  Normal cardiovascular exam Rhythm:Regular Rate:Normal  ECG: a-flutter, rate 76  ECHO: Left ventricle: The cavity size was normal. Wall thickness was normal. Systolic function was normal. The estimated ejection fraction was in the range of 60% to 65%. Doppler parameters are consistent with abnormal left ventricular relaxation (grade 1 diastolic dysfunction).  LVAD   Neuro/Psych PSYCHIATRIC DISORDERS Anxiety Depression Schizophrenia MS    GI/Hepatic Neg liver ROS, hiatal hernia, GERD  Medicated,  Endo/Other  Hypothyroidism   Renal/GU Renal InsufficiencyRenal disease  negative genitourinary   Musculoskeletal  (+) Fibromyalgia -  Abdominal   Peds negative pediatric ROS (+)  Hematology  (+) anemia ,   Anesthesia Other Findings hyperlipidemia  Reproductive/Obstetrics negative OB ROS                             Anesthesia Physical  Anesthesia Plan  ASA: III  Anesthesia Plan: General   Post-op Pain Management:    Induction: Intravenous  PONV Risk Score and Plan: 2 and Propofol infusion and Treatment may vary due to age or medical condition  Airway Management Planned:   Additional Equipment:   Intra-op Plan:   Post-operative Plan:   Informed Consent: I have reviewed the patients History and Physical, chart, labs and discussed the procedure including the risks, benefits and alternatives for the  proposed anesthesia with the patient or authorized representative who has indicated his/her understanding and acceptance.   Dental advisory given  Plan Discussed with: CRNA and Surgeon  Anesthesia Plan Comments:         Anesthesia Quick Evaluation

## 2016-11-08 NOTE — Transfer of Care (Signed)
Immediate Anesthesia Transfer of Care Note  Patient: Dale Shields  Procedure(s) Performed: Procedure(s): CARDIOVERSION (N/A)  Patient Location: Endoscopy Unit  Anesthesia Type:MAC  Level of Consciousness: drowsy  Airway & Oxygen Therapy: Patient Spontanous Breathing and Patient connected to nasal cannula oxygen  Post-op Assessment: Report given to RN and Post -op Vital signs reviewed and stable  Post vital signs: Reviewed and stable  Last Vitals:  Vitals:   11/08/16 1308  BP: 139/69  Pulse: 74  Resp: 14  Temp: 37 C    Last Pain:  Vitals:   11/08/16 1308  TempSrc: Oral         Complications: No apparent anesthesia complications

## 2016-12-02 ENCOUNTER — Ambulatory Visit (INDEPENDENT_AMBULATORY_CARE_PROVIDER_SITE_OTHER): Payer: Medicare Other | Admitting: Internal Medicine

## 2016-12-02 ENCOUNTER — Encounter: Payer: Self-pay | Admitting: Internal Medicine

## 2016-12-02 DIAGNOSIS — J449 Chronic obstructive pulmonary disease, unspecified: Secondary | ICD-10-CM

## 2016-12-02 DIAGNOSIS — G35 Multiple sclerosis: Secondary | ICD-10-CM

## 2016-12-02 DIAGNOSIS — J9611 Chronic respiratory failure with hypoxia: Secondary | ICD-10-CM

## 2016-12-02 DIAGNOSIS — J9612 Chronic respiratory failure with hypercapnia: Secondary | ICD-10-CM

## 2016-12-02 DIAGNOSIS — I4892 Unspecified atrial flutter: Secondary | ICD-10-CM

## 2016-12-02 MED ORDER — BUDESONIDE-FORMOTEROL FUMARATE 80-4.5 MCG/ACT IN AERO: 2.0000 | INHALATION_SPRAY | Freq: Two times a day (BID) | RESPIRATORY_TRACT | 0 refills | Status: DC

## 2016-12-02 NOTE — Assessment & Plan Note (Addendum)
Variably reports when quit smoking but hasn't smoked much sinc 11/2013 - 12/02/2016  After extensive coaching HFA effectiveness =    75% from a baseline of 25% > try symb 80 2bid and off advair due to globus sensations ? From advair/ dpi rx  - PFT's  12/02/2016  Ordered along with MIP/MEP since has MS    Difficult to get a feel for how much obstruction he has vs restriction from obesity vs diaphragm or exp muscle dysfucntion from MS but need this preop if at all possbile to assess risk.  May need bridge with bipap over first night a least with then max effort to minimize narcs/ max mobilization   Total time devoted to counseling  > 50 % of initial 60 min office visit:  review case with pt/ discussion of options/alternatives/ personally creating written customized instructions  in presence of pt  then going over those specific  Instructions directly with the pt including how to use all of the meds but in particular covering each new medication in detail and the difference between the maintenance= "automatic" meds and the prns using an action plan format for the latter (If this problem/symptom => do that organization reading Left to right).  Please see AVS from this visit for a full list of these instructions which I personally wrote for this pt and  are unique to this visit.

## 2016-12-02 NOTE — Assessment & Plan Note (Signed)
Echo 08/04/16 mild hypokinesis/  Pos Mod new MR, incidental PFO   Appears to be well compensated.  There are no specific criteria that would preclude general anesthesia for ablation but would like to obtain baseline pfts/ MIP/ MEP first - worse case scenario would be need for maybe overnight bipap after procedure

## 2016-12-02 NOTE — Assessment & Plan Note (Addendum)
HC03   08/18/16   =  34  - sats RA  93%  But using 2lpm 24/7 as of 12/02/2016 > continue for now but ok to titrate off daytime for sats > 90% as excess 02 may just lead to greater C02 retention in this setting

## 2016-12-02 NOTE — Assessment & Plan Note (Signed)
Complicated by hbp/ hyperlipidemia/ mild ohs   Body mass index is 35.73 kg/m.  -  trending up  Lab Results  Component Value Date   TSH 2.58 04/12/2013     Contributing to gerd risk/ doe/reviewed the need and the process to achieve and maintain neg calorie balance > defer f/u primary care including intermittently monitoring thyroid status

## 2016-12-02 NOTE — Progress Notes (Addendum)
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 but improved to point where only   minor gait issue /no sports then 2012/13  Became much less mobile due to multiple  orthopedic issues more so than by neuro problems then around 2016 became w/c dep due to shoulder and knee deterioration with sob attributed to copd since then to point where sob across the room/ 02 dep as well  S/p admit:    Admit date: 03/01/2016 Discharge date: 03/05/2016 Discharge Diagnoses:     Multiple sclerosis ([place])   COPD (chronic obstructive pulmonary disease) ([place])   CAD (coronary artery disease)   Hematuria   Generalized weakness   FTT (failure to thrive) in adult   Anemia of chronic disease   Depression   Anemia   NSTEMI (non-ST elevated myocardial infarction) ([place])   Gastric ulcer with hemorrhage Escherichia coli UTI     Current Discharge Medication List       START taking these medications   Details  pantoprazole (PROTONIX) 40 MG tablet Take 1 tablet (40 mg total) by mouth 2 (two) times daily. Qty: 60 tablet, Refills: 1    sucralfate (CARAFATE) 1 g tablet Take 1 tablet (1 g total) by mouth 4 (four) times daily -  with meals and at bedtime. Qty: 90 tablet, Refills: 1         CONTINUE these medications which have NOT CHANGED   Details  albuterol (PROVENTIL) (2.5 MG/3ML) 0.083% nebulizer solution Take 2.5 mg by nebulization every 6 (six) hours as needed for wheezing or shortness of breath.    aspirin 81 MG chewable tablet Chew 81 mg by mouth daily.     atorvastatin (LIPITOR) 10 MG tablet Take 10 mg by mouth daily.    b complex vitamins tablet Take 1 tablet by mouth daily.    carvedilol (COREG) 6.25 MG tablet Take 6.25 mg by mouth 2 (two) times daily.    DULoxetine (CYMBALTA) 60 MG capsule TAKE (1) CAPSULE BY MOUTH TWICE DAILY. Qty: 60 capsule, Refills: 2     Fluticasone-Salmeterol (ADVAIR) 250-50 MCG/DOSE AEPB Inhale 1 puff into the lungs 2 (two) times daily as needed.     hydrOXYzine (ATARAX/VISTARIL) 25 MG tablet Take 1 tablet (25 mg total) by mouth 3 (three) times daily as needed. Qty: 90 tablet, Refills: 2    levalbuterol (XOPENEX HFA) 45 MCG/ACT inhaler Inhale 2 puffs into the lungs every 4 (four) hours as needed for wheezing.    Multiple Vitamins-Minerals (THEREMS-M) TABS Take 1 tablet by mouth daily.    nitroGLYCERIN (NITROSTAT) 0.3 MG SL tablet Place 0.4 mg under the tongue every 5 (five) minutes as needed for chest pain.    NUVIGIL 250 MG tablet TAKE 1 TABLET BY MOUTH TWICE DAILY. Qty: 60 tablet, Refills: 0    omeprazole (PRILOSEC) 20 MG capsule Take 1 capsule (20 mg total) by mouth daily. Qty: 60 capsule, Refills: 0    Thiamine HCl (VITAMIN B-1 PO) Take 1 tablet by mouth daily.    traZODone (DESYREL) 50 MG tablet Take 1 tablet by mouth at bedtime as needed for sleep.    VITAMIN A PO Take 1 tablet by mouth daily.    vitamin C (ASCORBIC ACID) 500 MG tablet Take 1,000 mg by mouth 2 (two) times daily.     VITAMIN E PO Take 1 tablet by  mouth daily.    ARIPiprazole (ABILIFY) 2 MG tablet Take 1 tablet (2 mg total) by mouth daily. Qty: 30 tablet, Refills: 2    ondansetron (ZOFRAN ODT) 8 MG disintegrating tablet Take 1 tablet (8 mg total) by mouth every 8 (eight) hours as needed for nausea or vomiting. Qty: 10 tablet, Refills: 0  Augmentin 875 mg by mouth daily for 7 days         12/02/2016 1st [facility] office visit/ Melvyn Novas   Dx copd/ on advair and 02  Chief Complaint  Patient presents with  . Pulmonary Consult    Referred by Dr. Nadyne Coombes. Pt states that he was dxed with COPD years ago. He believes he was referred here today for pulmonary clearance for heart surgery. He states that any activity makes him feel SOB.  He has occ cough with minimal yellow sputum.     baseline doe x across the room off 02, a  little further on 02 but then limited by ortho/weakness issues  Able to lie flat prefers  R side down but does ok on back on 2lpm  Hoarseness  comes and go assoc with minimal am cough better since quit smoking / no better sob or cough on saba Has sensation of globus x years (did not have prior to advair rx)  Needs to be cleared for gen anesthesia x 2 h for ablation by Dr Caryl Comes    No obvious day to day or daytime variability or assoc excess  sputum or mucus plugs or hemoptysis or cp or chest tightness, subjective wheeze or overt sinus or hb symptoms. No unusual exp hx or h/o childhood pna/ asthma or knowledge of premature birth.  Sleeping ok without nocturnal  or early am exacerbation  of respiratory  c/o's or need for noct saba. Also denies any obvious fluctuation of symptoms with weather or environmental changes or other aggravating or alleviating factors except as outlined above   Current Medications, Allergies, Complete Past Medical History, Past Surgical History, Family History, and Social History were reviewed in [employer] record.  ROS  The following are not active complaints unless bolded sore throat, dysphagia, dental problems, itching, sneezing,  nasal congestion or excess/ purulent secretions, ear ache,   fever, chills, sweats, unintended wt loss, classically pleuritic or exertional cp,  orthopnea pnd or leg swelling, presyncope, palpitations, abdominal pain, anorexia, nausea, vomiting, diarrhea  or change in bowel or bladder habits, change in stools or urine, dysuria,hematuria,  rash, arthralgias, visual complaints, headache, numbness, weakness or ataxia or problems with walking or coordination,  change in mood/affect or memory.          Review of Systems     Objective:   Physical Exam    top dentures   Motorized W/c bound obese hoarse wm nad  / poor cough mechanics ? Effort or muscle related    Wt Readings from Last 3 Encounters:  12/02/16 235 lb  (106.6 kg)  11/08/16 224 lb (101.6 kg)  10/13/16 230 lb (104.3 kg)    Vital signs reviewed  - Note on arrival 02 sats  93% on RA     HEENT: nl dentition, turbinates bilaterally, and oropharynx. Nl external ear canals without cough reflex   NECK :  without JVD/Nodes/TM/ nl carotid upstrokes bilaterally   LUNGS: no acc muscle use,   slt barrel chest with distant bs and mostly pseudowheezes   CV:  RRR  no s3 or murmur or increase in P2, and  1+ ptting bilateral lower ext edema   ABD:  Quite obese but soft and nontender with nl inspiratory excursion in the supine position. No bruits or organomegaly appreciated, bowel sounds nl  MS:  Nl gait/ ext warm without deformities, calf tenderness, cyanosis or clubbing No obvious joint restrictions   SKIN: warm and dry without lesions    NEURO:  alert, approp, nl sensorium with  no motor or cerebellar deficits apparent.       I personally reviewed images and agree with radiology impression as follows:  CXR:  08/18/16 COPD without evidence of acute cardiopulmonary disease.     Assessment:

## 2016-12-02 NOTE — Assessment & Plan Note (Signed)
rec mip/mep preop to determine if any MS effect on diaphragm or exp muscles respectively

## 2016-12-02 NOTE — Patient Instructions (Signed)
Stop advair and start symbicort 80 Take 2 puffs first thing in am and then another 2 puffs about 12 hours later.    PFT's with MIP/ MEP next available at [facility] and I will clear you for surgery then   Please schedule a follow up visit in 3 months but call sooner if needed

## 2016-12-09 ENCOUNTER — Ambulatory Visit (HOSPITAL_COMMUNITY)
Admission: RE | Admit: 2016-12-09 | Discharge: 2016-12-09 | Disposition: A | Discharge status: Home / Self Care | Payer: Medicare Other | Source: Ambulatory Visit | Attending: Internal Medicine | Admitting: Internal Medicine

## 2016-12-09 DIAGNOSIS — J449 Chronic obstructive pulmonary disease, unspecified: Secondary | ICD-10-CM

## 2016-12-09 LAB — PULMONARY FUNCTION TEST
DL/VA % pred: 46 %
DL/VA: 2.08 ml/min/mmHg/L
DLCO UNC % PRED: 29 %
DLCO UNC: 8.7 ml/min/mmHg
FEF 25-75 PRE: 0.24 L/s
FEF 25-75 Post: 0.32 L/sec
FEF2575-%Change-Post: 31 %
FEF2575-%PRED-POST: 12 %
FEF2575-%Pred-Pre: 9 %
FEV1-%CHANGE-POST: 12 %
FEV1-%PRED-POST: 30 %
FEV1-%PRED-PRE: 27 %
FEV1-POST: 0.98 L
FEV1-Pre: 0.87 L
FEV1FVC-%Change-Post: -2 %
FEV1FVC-%Pred-Pre: 40 %
FEV6-%CHANGE-POST: 12 %
FEV6-%PRED-POST: 56 %
FEV6-%PRED-PRE: 50 %
FEV6-POST: 2.31 L
FEV6-PRE: 2.05 L
FEV6FVC-%CHANGE-POST: -6 %
FEV6FVC-%PRED-POST: 72 %
FEV6FVC-%PRED-PRE: 77 %
FVC-%Change-Post: 16 %
FVC-%PRED-POST: 77 %
FVC-%Pred-Pre: 67 %
FVC-Post: 3.33 L
FVC-Pre: 2.87 L
POST FEV1/FVC RATIO: 29 %
POST FEV6/FVC RATIO: 69 %
PRE FEV6/FVC RATIO: 74 %
Pre FEV1/FVC ratio: 30 %
RV % PRED: 186 %
RV: 4.15 L
TLC % pred: 117 %
TLC: 7.74 L

## 2016-12-09 MED ORDER — ALBUTEROL SULFATE (2.5 MG/3ML) 0.083% IN NEBU
2.5000 mg | INHALATION_SOLUTION | Freq: Once | RESPIRATORY_TRACT | Status: AC
  Administered 2016-12-09: 2.5 mg via RESPIRATORY_TRACT

## 2016-12-13 NOTE — Progress Notes (Signed)
lmtcb

## 2016-12-19 NOTE — Progress Notes (Signed)
LMTCB

## 2016-12-20 NOTE — Progress Notes (Signed)
LMTCB

## 2016-12-22 ENCOUNTER — Encounter: Payer: Self-pay | Admitting: *Deleted

## 2017-01-16 ENCOUNTER — Telehealth: Payer: Self-pay | Admitting: Internal Medicine

## 2017-01-16 NOTE — Telephone Encounter (Signed)
Spoke with Arbie Cookey and made an appt for pt to see MW. Nothing further is needed.

## 2017-01-16 NOTE — Telephone Encounter (Signed)
Notes recorded by Tanda Rockers, MD on 12/12/2016 at 6:19 AM EDT Call patient : C/w severe copd with minimal muscle weakness related to MS> no change rx Be sure patient has f/u ov so we can go over all the details of this study and get a plan together moving forward - ok to move up f/u if not feeling better and wants to be seen sooner   Advised pt of results. Pt understood and nothing further is needed.   He wanted me to call the facility to make an appt. Awaiting a call back from [place], she was unavailable when I called.

## 2017-01-27 ENCOUNTER — Ambulatory Visit: Payer: Self-pay | Admitting: Internal Medicine

## 2017-02-06 ENCOUNTER — Telehealth: Payer: Self-pay | Admitting: Internal Medicine

## 2017-02-06 ENCOUNTER — Ambulatory Visit (INDEPENDENT_AMBULATORY_CARE_PROVIDER_SITE_OTHER): Payer: Medicare Other | Admitting: Internal Medicine

## 2017-02-06 ENCOUNTER — Encounter: Payer: Self-pay | Admitting: Internal Medicine

## 2017-02-06 VITALS — BP 142/68 | HR 75

## 2017-02-06 DIAGNOSIS — J9612 Chronic respiratory failure with hypercapnia: Secondary | ICD-10-CM

## 2017-02-06 DIAGNOSIS — J449 Chronic obstructive pulmonary disease, unspecified: Secondary | ICD-10-CM

## 2017-02-06 DIAGNOSIS — J9611 Chronic respiratory failure with hypoxia: Secondary | ICD-10-CM

## 2017-02-06 MED ORDER — BUDESONIDE-FORMOTEROL FUMARATE 160-4.5 MCG/ACT IN AERO: 2.0000 | INHALATION_SPRAY | Freq: Two times a day (BID) | RESPIRATORY_TRACT | 0 refills | Status: AC

## 2017-02-06 MED ORDER — TIOTROPIUM BROMIDE MONOHYDRATE 2.5 MCG/ACT IN AERS: 2.0000 | INHALATION_SPRAY | Freq: Every day | RESPIRATORY_TRACT | 0 refills | Status: DC

## 2017-02-06 NOTE — Patient Instructions (Signed)
Plan A = Automatic = symbicort Take 2 puffs first thing in am and spiriva 2.5 x 2 pffs each am  then another 2 puffs about 12 hours later    Plan B = Backup Only use your albuterol as a rescue medication to be used if you can't catch your breath by resting or doing a relaxed purse lip breathing pattern.  - The less you use it, the better it will work when you need it. - Ok to use the inhaler up to 2 puffs  every 4 hours if you must but call for appointment if use goes up over your usual need - Don't leave home without it !!  (think of it like the spare tire for your car)   Plan C = Crisis - only use your levoalbuterol nebulizer if you first try Plan B and it fails to help > ok to use the nebulizer up to every 4 hours but if start needing it regularly call for immediate appointment  Ok to turn your 02 up to 4lpm with exertion/ otherwise keep at 2lpm 24/7    Please schedule a follow up office visit in 6 weeks, call sooner if needed

## 2017-02-06 NOTE — Telephone Encounter (Signed)
Assessment & Plan Note by Tanda Rockers, MD at 02/06/2017 9:26 AM   Author: Tanda Rockers, MD Author Type: Physician Filed: 02/06/2017 9:27 AM  Note Status: Written Cosign: Cosign Not Required Encounter Date: 02/06/2017  Problem: Chronic respiratory failure with hypoxia and hypercapnia ([place])  Editor: Tanda Rockers, MD (Physician)    HC03   08/18/16   =  34   Feels needs 4lpm after exertion and advised better to use with exertion or titrate with ex for sats > 90% but default for now is 2 lpm 24/7      ATC pt, no answer. Left message for pt to call back. I am not sure if he is needing an order because it looks like he is already on oxygen.

## 2017-02-06 NOTE — Assessment & Plan Note (Signed)
HC03   08/18/16   =  34   Feels needs 4lpm after exertion and advised better to use with exertion or titrate with ex for sats > 90% but default for now is 2 lpm 24/7

## 2017-02-06 NOTE — Assessment & Plan Note (Addendum)
Variably reports when quit smoking but hasn't smoked much since 11/2013 - 12/02/2016  try symb 80 2bid and off advair as has sense of globus / related to dpi ?  - PFT's   12/09/16   MIP/MEP = 74%/ 49% respectively    FEV1 0.98 (30 % ) ratio 29  p 12 % improvement from saba p ? prior to study with DLCO  29 % corrects to 46 % for alv volume  With air trapping and classic curvature   - 02/06/2017  After extensive coaching HFA effectiveness =    75% from a baseline of 25%  He really has better insp effort than I thought he would and did much better after teaching approp hfa   Group D in terms of symptom/risk and laba/lama/ICS  therefore appropriate rx at this point so best combo = symb 160 /spiriva but if remains stable off cigs could try laba/lama = bevespi at some future visit   I had an extended discussion with the patient reviewing all relevant studies completed to date and  lasting 15 to 20 minutes of a 25 minute visit    Each maintenance medication was reviewed in detail including most importantly the difference between maintenance and prns and under what circumstances the prns are to be triggered using an action plan format that is not reflected in the computer generated alphabetically organized AVS.    Please see AVS for specific instructions unique to this visit that I personally wrote and verbalized to the the pt in detail and then reviewed with pt  by my nurse highlighting any  changes in therapy recommended at today's visit to their plan of care.

## 2017-02-06 NOTE — Progress Notes (Signed)
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 but improved to point where only   minor gait issue /no sports then 2012/13  Became much less mobile due to multiple  orthopedic issues more so than by neuro problems then around 2016 became w/c dep due to shoulder and knee deterioration with sob attributed to copd since then to point where sob across the room/ 02 dep as well  S/p admit:    Admit date: 03/01/2016 Discharge date: 03/05/2016 Discharge Diagnoses:     Multiple sclerosis ([place])   COPD (chronic obstructive pulmonary disease) ([place])   CAD (coronary artery disease)   Hematuria   Generalized weakness   FTT (failure to thrive) in adult   Anemia of chronic disease   Depression   Anemia   NSTEMI (non-ST elevated myocardial infarction) ([place])   Gastric ulcer with hemorrhage Escherichia coli UTI     Current Discharge Medication List       START taking these medications   Details  pantoprazole (PROTONIX) 40 MG tablet Take 1 tablet (40 mg total) by mouth 2 (two) times daily. Qty: 60 tablet, Refills: 1    sucralfate (CARAFATE) 1 g tablet Take 1 tablet (1 g total) by mouth 4 (four) times daily -  with meals and at bedtime. Qty: 90 tablet, Refills: 1         CONTINUE these medications which have NOT CHANGED   Details  albuterol (PROVENTIL) (2.5 MG/3ML) 0.083% nebulizer solution Take 2.5 mg by nebulization every 6 (six) hours as needed for wheezing or shortness of breath.    aspirin 81 MG chewable tablet Chew 81 mg by mouth daily.     atorvastatin (LIPITOR) 10 MG tablet Take 10 mg by mouth daily.    b complex vitamins tablet Take 1 tablet by mouth daily.    carvedilol (COREG) 6.25 MG tablet Take 6.25 mg by mouth 2 (two) times daily.    DULoxetine (CYMBALTA) 60 MG capsule TAKE (1) CAPSULE BY MOUTH TWICE DAILY. Qty: 60 capsule, Refills: 2     Fluticasone-Salmeterol (ADVAIR) 250-50 MCG/DOSE AEPB Inhale 1 puff into the lungs 2 (two) times daily as needed.     hydrOXYzine (ATARAX/VISTARIL) 25 MG tablet Take 1 tablet (25 mg total) by mouth 3 (three) times daily as needed. Qty: 90 tablet, Refills: 2    levalbuterol (XOPENEX HFA) 45 MCG/ACT inhaler Inhale 2 puffs into the lungs every 4 (four) hours as needed for wheezing.    Multiple Vitamins-Minerals (THEREMS-M) TABS Take 1 tablet by mouth daily.    nitroGLYCERIN (NITROSTAT) 0.3 MG SL tablet Place 0.4 mg under the tongue every 5 (five) minutes as needed for chest pain.    NUVIGIL 250 MG tablet TAKE 1 TABLET BY MOUTH TWICE DAILY. Qty: 60 tablet, Refills: 0    omeprazole (PRILOSEC) 20 MG capsule Take 1 capsule (20 mg total) by mouth daily. Qty: 60 capsule, Refills: 0    Thiamine HCl (VITAMIN B-1 PO) Take 1 tablet by mouth daily.    traZODone (DESYREL) 50 MG tablet Take 1 tablet by mouth at bedtime as needed for sleep.    VITAMIN A PO Take 1 tablet by mouth daily.    vitamin C (ASCORBIC ACID) 500 MG tablet Take 1,000 mg by mouth 2 (two) times daily.     VITAMIN E PO Take 1 tablet by  mouth daily.    ARIPiprazole (ABILIFY) 2 MG tablet Take 1 tablet (2 mg total) by mouth daily. Qty: 30 tablet, Refills: 2    ondansetron (ZOFRAN ODT) 8 MG disintegrating tablet Take 1 tablet (8 mg total) by mouth every 8 (eight) hours as needed for nausea or vomiting. Qty: 10 tablet, Refills: 0  Augmentin 875 mg by mouth daily for 7 days         12/02/2016 1st [facility] office visit/ Melvyn Novas   Dx copd/ on advair and 02  Chief Complaint  Patient presents with  . Pulmonary Consult    Referred by Dr. Nadyne Coombes. Pt states that he was dxed with COPD years ago. He believes he was referred here today for pulmonary clearance for heart surgery. He states that any activity makes him feel SOB.  He has occ cough with minimal yellow sputum.     baseline doe x across the room off 02, a  little further on 02 but then limited by ortho/weakness issues  Able to lie flat prefers  R side down but does ok on back on 2lpm  Hoarseness  comes and go assoc with minimal am cough better since quit smoking / no better sob or cough on saba Has sensation of globus x years (did not have prior to advair rx)  Needs to be cleared for gen anesthesia x 2 h for ablation by Dr Caryl Comes  rec Stop advair and start symbicort 80 Take 2 puffs first thing in am and then another 2 puffs about 12 hours later.  PFT's with MIP/ MEP next available at [facility] and I will clear you for surgery then     02/06/2017  f/u ov/Gary Gabrielsen re: GOLD IV COPD/ very poor hfa Chief Complaint  Patient presents with  . Follow-up    Increased chest congestion and SOB ever since the last visit. He will occ cough up some minimal yellow sputum.     w/c bound  -  Able to walk across the room easier on 2lp -4 lpm (uses 4lpm after exerting) Gradually worse doe x 2 months but no sob at rest or noct Not clear he has hfa / tries to get around to using xopenex up to twice daily  R side down/ 2lpm slt elevated   No obvious day to day or daytime variability or assoc   mucus plugs or hemoptysis or cp or chest tightness, subjective wheeze or overt sinus or hb symptoms. No unusual exp hx or h/o childhood pna/ asthma or knowledge of premature birth.  Sleeping ok almost horizontal  without nocturnal  or early am exacerbation  of respiratory  c/o's or need for noct saba. Also denies any obvious fluctuation of symptoms with weather or environmental changes or other aggravating or alleviating factors except as outlined above   Current Allergies, Complete Past Medical History, Past Surgical History, Family History, and Social History were reviewed in [employer] record.  ROS  The following are not active complaints unless bolded Hoarseness, sore throat, dysphagia, dental problems, itching, sneezing,  nasal congestion or discharge of  excess mucus or purulent secretions, ear ache,   fever, chills, sweats, unintended wt loss or wt gain, classically pleuritic or exertional cp,  orthopnea pnd or leg swelling, presyncope, palpitations, abdominal pain, anorexia, nausea, vomiting, diarrhea  or change in bowel habits or change in bladder habits, change in stools or change in urine, dysuria, hematuria,  rash, arthralgias, visual complaints, headache, numbness, weakness or ataxia or problems with  walking or coordination,  change in mood/affect or memory.        Current Meds  Medication Sig  . acetaminophen (TYLENOL) 650 MG CR tablet Take 650 mg by mouth every 4 (four) hours as needed for pain.  Marland Kitchen albuterol (PROVENTIL HFA;VENTOLIN HFA) 108 (90 Base) MCG/ACT inhaler Inhale 2 puffs into the lungs as needed for wheezing or shortness of breath.  Marland Kitchen apixaban (ELIQUIS) 5 MG TABS tablet Take 5 mg by mouth 2 (two) times daily.  . Armodafinil 250 MG tablet Take 250 mg by mouth daily.  Marland Kitchen atorvastatin (LIPITOR) 10 MG tablet Take 10 mg by mouth daily.  . clonazePAM (KLONOPIN) 0.5 MG tablet Take 0.5 mg by mouth 2 (two) times daily as needed for anxiety.  . digoxin (LANOXIN) 0.125 MG tablet Take 0.125 mg by mouth every other day.   . diltiazem (CARDIZEM LA) 180 MG 24 hr tablet Take 180 mg by mouth daily.   . DULoxetine (CYMBALTA) 60 MG capsule TAKE (1) CAPSULE BY MOUTH TWICE DAILY.  . ergocalciferol (VITAMIN D2) 50000 units capsule Take 50,000 Units by mouth once a week.  . eszopiclone (LUNESTA) 1 MG TABS tablet Take 1 mg by mouth at bedtime as needed for sleep. Take immediately before bedtime  . ferrous sulfate 325 (65 FE) MG EC tablet Take 325 mg by mouth daily with breakfast.  . guaiFENesin-dextromethorphan (ROBITUSSIN DM) 100-10 MG/5ML syrup Take 5 mLs by mouth every 4 (four) hours as needed for cough.  . methadone (DOLOPHINE) 10 MG tablet Take 20 mg by mouth every 12 (twelve) hours.   . metoprolol tartrate (LOPRESSOR) 25 MG tablet Take 25 mg by  mouth 2 (two) times daily.   . nitroGLYCERIN (NITROSTAT) 0.3 MG SL tablet Place 0.4 mg under the tongue every 5 (five) minutes as needed for chest pain.  Marland Kitchen omeprazole (PRILOSEC) 40 MG capsule Take 40 mg by mouth 2 (two) times daily.  . OXYGEN 2lpm 24/7  . traZODone (DESYREL) 50 MG tablet Take 2 tablets by mouth at bedtime.   . varenicline (CHANTIX) 1 MG tablet Take 1 mg by mouth 2 (two) times daily.  .   budesonide-formoterol (SYMBICORT) 80-4.5 MCG/ACT inhaler Inhale 2 puffs into the lungs 2 (two) times daily.                      Objective:   Physical Exam    top dentures   Motorized W/c bound obese wm nad     02/06/2017     Declined   12/02/16 235 lb (106.6 kg)  11/08/16 224 lb (101.6 kg)  10/13/16 230 lb (104.3 kg)    Vital signs reviewed  - Note on arrival 02 sats  95% on   2lpm    HEENT: nl dentition, turbinates bilaterally, and oropharynx. Nl external ear canals without cough reflex   NECK :  without JVD/Nodes/TM/ nl carotid upstrokes bilaterally   LUNGS: no acc muscle use,   slt barrel chest with distant bs and no longer with much pseudowheeze at all   CV:  RRR  no s3 or murmur or increase in P2, and  No significant  lower ext edema   ABD:  Quite obese but soft and nontender with nl inspiratory excursion in the supine position. No bruits or organomegaly appreciated, bowel sounds nl  MS:  Nl gait/ ext warm without deformities, calf tenderness, cyanosis or clubbing No obvious joint restrictions   SKIN: warm and dry without lesions    NEURO:  alert, approp, nl sensorium with  no motor or cerebellar deficits apparent.          Assessment:

## 2017-02-07 NOTE — Telephone Encounter (Signed)
ATC pt, no answer. Left message for pt to call back.  

## 2017-02-08 NOTE — Telephone Encounter (Signed)
Spoke with the pt  He states wants o2 through [facility] or [facility] since what he is using now is the facility that he resides in  I have sent order to [facility]  Nothing further needed

## 2017-02-08 NOTE — Telephone Encounter (Signed)
ATC pt, no answer. Left message for pt to call back.  

## 2017-02-10 ENCOUNTER — Telehealth: Payer: Self-pay | Admitting: Internal Medicine

## 2017-02-10 NOTE — Telephone Encounter (Signed)
Will need ov to qualify

## 2017-02-10 NOTE — Telephone Encounter (Signed)
Left message for patient to call back so that he can be scheduled for a OV with a walk.

## 2017-02-10 NOTE — Telephone Encounter (Signed)
Called and spoke with Jeani Hawking with [facility] and she stated that she will need some type of testing on this pt---he will need to be walked on room air and then placed on the 2 liters and document these number and upload them in the computer and she can proceed with the orders for the oxygen.  MW please advise.

## 2017-02-13 NOTE — Telephone Encounter (Signed)
lmomtcb x 2 for the pt to schedule OV with a walk.

## 2017-02-14 NOTE — Telephone Encounter (Signed)
lmomtcb x 3 for pt to call back to schedule OV for a walk.

## 2017-02-15 NOTE — Telephone Encounter (Signed)
We have been unable to reach the pt and have left several messages to have the pt call us back.  Will sign off of message and wait for pt to call back .

## 2017-02-28 ENCOUNTER — Encounter (HOSPITAL_COMMUNITY): Payer: Self-pay

## 2017-02-28 ENCOUNTER — Other Ambulatory Visit: Payer: Self-pay

## 2017-02-28 ENCOUNTER — Emergency Department (HOSPITAL_COMMUNITY): Payer: Medicare Other

## 2017-02-28 ENCOUNTER — Inpatient Hospital Stay (HOSPITAL_COMMUNITY)
Admission: EM | Admit: 2017-02-28 | Discharge: 2017-03-03 | DRG: 699 | Disposition: A | Discharge status: Skilled Nursing Facility | Payer: Medicare Other | Attending: Internal Medicine | Admitting: Internal Medicine

## 2017-02-28 DIAGNOSIS — Z87891 Personal history of nicotine dependence: Secondary | ICD-10-CM

## 2017-02-28 DIAGNOSIS — G35 Multiple sclerosis: Secondary | ICD-10-CM | POA: Diagnosis present

## 2017-02-28 DIAGNOSIS — Z955 Presence of coronary angioplasty implant and graft: Secondary | ICD-10-CM

## 2017-02-28 DIAGNOSIS — J449 Chronic obstructive pulmonary disease, unspecified: Secondary | ICD-10-CM | POA: Diagnosis present

## 2017-02-28 DIAGNOSIS — F329 Major depressive disorder, single episode, unspecified: Secondary | ICD-10-CM | POA: Diagnosis present

## 2017-02-28 DIAGNOSIS — N400 Enlarged prostate without lower urinary tract symptoms: Secondary | ICD-10-CM | POA: Diagnosis present

## 2017-02-28 DIAGNOSIS — K219 Gastro-esophageal reflux disease without esophagitis: Secondary | ICD-10-CM | POA: Diagnosis present

## 2017-02-28 DIAGNOSIS — M159 Polyosteoarthritis, unspecified: Secondary | ICD-10-CM | POA: Diagnosis present

## 2017-02-28 DIAGNOSIS — Z888 Allergy status to other drugs, medicaments and biological substances status: Secondary | ICD-10-CM

## 2017-02-28 DIAGNOSIS — M879 Osteonecrosis, unspecified: Secondary | ICD-10-CM | POA: Diagnosis present

## 2017-02-28 DIAGNOSIS — F419 Anxiety disorder, unspecified: Secondary | ICD-10-CM | POA: Diagnosis present

## 2017-02-28 DIAGNOSIS — Z7901 Long term (current) use of anticoagulants: Secondary | ICD-10-CM

## 2017-02-28 DIAGNOSIS — R1084 Generalized abdominal pain: Secondary | ICD-10-CM

## 2017-02-28 DIAGNOSIS — T83511A Infection and inflammatory reaction due to indwelling urethral catheter, initial encounter: Secondary | ICD-10-CM | POA: Diagnosis not present

## 2017-02-28 DIAGNOSIS — Z66 Do not resuscitate: Secondary | ICD-10-CM | POA: Diagnosis present

## 2017-02-28 DIAGNOSIS — J9611 Chronic respiratory failure with hypoxia: Secondary | ICD-10-CM | POA: Diagnosis present

## 2017-02-28 DIAGNOSIS — N390 Urinary tract infection, site not specified: Secondary | ICD-10-CM | POA: Diagnosis not present

## 2017-02-28 DIAGNOSIS — Y846 Urinary catheterization as the cause of abnormal reaction of the patient, or of later complication, without mention of misadventure at the time of the procedure: Secondary | ICD-10-CM | POA: Diagnosis present

## 2017-02-28 DIAGNOSIS — Z9981 Dependence on supplemental oxygen: Secondary | ICD-10-CM

## 2017-02-28 DIAGNOSIS — N179 Acute kidney failure, unspecified: Secondary | ICD-10-CM | POA: Diagnosis present

## 2017-02-28 DIAGNOSIS — Z79899 Other long term (current) drug therapy: Secondary | ICD-10-CM

## 2017-02-28 DIAGNOSIS — E785 Hyperlipidemia, unspecified: Secondary | ICD-10-CM | POA: Diagnosis present

## 2017-02-28 DIAGNOSIS — M797 Fibromyalgia: Secondary | ICD-10-CM | POA: Diagnosis present

## 2017-02-28 DIAGNOSIS — Z993 Dependence on wheelchair: Secondary | ICD-10-CM

## 2017-02-28 DIAGNOSIS — I10 Essential (primary) hypertension: Secondary | ICD-10-CM | POA: Diagnosis present

## 2017-02-28 DIAGNOSIS — I2510 Atherosclerotic heart disease of native coronary artery without angina pectoris: Secondary | ICD-10-CM | POA: Diagnosis present

## 2017-02-28 DIAGNOSIS — T83518A Infection and inflammatory reaction due to other urinary catheter, initial encounter: Principal | ICD-10-CM | POA: Diagnosis present

## 2017-02-28 DIAGNOSIS — I4892 Unspecified atrial flutter: Secondary | ICD-10-CM | POA: Diagnosis not present

## 2017-02-28 DIAGNOSIS — D638 Anemia in other chronic diseases classified elsewhere: Secondary | ICD-10-CM | POA: Diagnosis present

## 2017-02-28 DIAGNOSIS — Z8614 Personal history of Methicillin resistant Staphylococcus aureus infection: Secondary | ICD-10-CM

## 2017-02-28 DIAGNOSIS — G894 Chronic pain syndrome: Secondary | ICD-10-CM | POA: Diagnosis not present

## 2017-02-28 DIAGNOSIS — Z79891 Long term (current) use of opiate analgesic: Secondary | ICD-10-CM

## 2017-02-28 DIAGNOSIS — N319 Neuromuscular dysfunction of bladder, unspecified: Secondary | ICD-10-CM | POA: Diagnosis present

## 2017-02-28 DIAGNOSIS — I252 Old myocardial infarction: Secondary | ICD-10-CM

## 2017-02-28 LAB — COMPREHENSIVE METABOLIC PANEL
ALT: 16 U/L — ABNORMAL LOW (ref 17–63)
AST: 18 U/L (ref 15–41)
Albumin: 2.8 g/dL — ABNORMAL LOW (ref 3.5–5.0)
Alkaline Phosphatase: 115 U/L (ref 38–126)
Anion gap: 9 (ref 5–15)
BUN: 22 mg/dL — ABNORMAL HIGH (ref 6–20)
CHLORIDE: 97 mmol/L — AB (ref 101–111)
CO2: 30 mmol/L (ref 22–32)
CREATININE: 1.41 mg/dL — AB (ref 0.61–1.24)
Calcium: 8.9 mg/dL (ref 8.9–10.3)
GFR, EST AFRICAN AMERICAN: 59 mL/min — AB (ref >60)
GFR, EST NON AFRICAN AMERICAN: 51 mL/min — AB (ref >60)
Glucose, Bld: 147 mg/dL — ABNORMAL HIGH (ref 65–99)
POTASSIUM: 4 mmol/L (ref 3.5–5.1)
SODIUM: 136 mmol/L (ref 135–145)
TOTAL PROTEIN: 7.3 g/dL (ref 6.5–8.1)
Total Bilirubin: 0.5 mg/dL (ref 0.3–1.2)

## 2017-02-28 LAB — CBC WITH DIFFERENTIAL/PLATELET
Basophils Absolute: 0 10*3/uL (ref 0.0–0.1)
Basophils Relative: 0 %
EOS ABS: 0.5 10*3/uL (ref 0.0–0.7)
Eosinophils Relative: 4 %
HEMATOCRIT: 30.3 % — AB (ref 39.0–52.0)
HEMOGLOBIN: 9.3 g/dL — AB (ref 13.0–17.0)
LYMPHS ABS: 1.4 10*3/uL (ref 0.7–4.0)
LYMPHS PCT: 11 %
MCH: 30.4 pg (ref 26.0–34.0)
MCHC: 30.7 g/dL (ref 30.0–36.0)
MCV: 99 fL (ref 78.0–100.0)
Monocytes Absolute: 1 10*3/uL (ref 0.1–1.0)
Monocytes Relative: 8 %
NEUTROS ABS: 10.3 10*3/uL — AB (ref 1.7–7.7)
NEUTROS PCT: 77 %
Platelets: 316 10*3/uL (ref 150–400)
RBC: 3.06 MIL/uL — AB (ref 4.22–5.81)
RDW: 15.2 % (ref 11.5–15.5)
WBC: 13.3 10*3/uL — AB (ref 4.0–10.5)

## 2017-02-28 LAB — URINALYSIS, ROUTINE W REFLEX MICROSCOPIC
Bilirubin Urine: NEGATIVE
GLUCOSE, UA: NEGATIVE mg/dL
Ketones, ur: NEGATIVE mg/dL
Nitrite: NEGATIVE
PROTEIN: 100 mg/dL — AB
Specific Gravity, Urine: 1.009 (ref 1.005–1.030)
Squamous Epithelial / LPF: NONE SEEN
pH: 5 (ref 5.0–8.0)

## 2017-02-28 LAB — MRSA PCR SCREENING: MRSA by PCR: POSITIVE — AB

## 2017-02-28 LAB — LIPASE, BLOOD: LIPASE: 18 U/L (ref 11–51)

## 2017-02-28 MED ORDER — NITROGLYCERIN 0.4 MG SL SUBL: 0.4000 mg | SUBLINGUAL_TABLET | SUBLINGUAL | Status: DC | PRN

## 2017-02-28 MED ORDER — IOPAMIDOL (ISOVUE-300) INJECTION 61%: 100.0000 mL | Freq: Once | INTRAVENOUS | Status: DC | PRN

## 2017-02-28 MED ORDER — ATORVASTATIN CALCIUM 10 MG PO TABS
10.0000 mg | ORAL_TABLET | Freq: Every day | ORAL | Status: DC
  Administered 2017-02-28 – 2017-03-03 (×4): 10 mg via ORAL
  Filled 2017-02-28 (×4): qty 1

## 2017-02-28 MED ORDER — CLONAZEPAM 0.5 MG PO TABS
0.5000 mg | ORAL_TABLET | Freq: Two times a day (BID) | ORAL | Status: DC | PRN
  Administered 2017-02-28 – 2017-03-02 (×3): 0.5 mg via ORAL
  Filled 2017-02-28 (×4): qty 1

## 2017-02-28 MED ORDER — VARENICLINE TARTRATE 1 MG PO TABS
1.0000 mg | ORAL_TABLET | Freq: Two times a day (BID) | ORAL | Status: DC
  Administered 2017-02-28 – 2017-03-03 (×7): 1 mg via ORAL
  Filled 2017-02-28 (×13): qty 1

## 2017-02-28 MED ORDER — SODIUM CHLORIDE 0.9 % IV SOLN
INTRAVENOUS | Status: DC
  Administered 2017-02-28 – 2017-03-02 (×5): via INTRAVENOUS

## 2017-02-28 MED ORDER — ONDANSETRON HCL 4 MG/2ML IJ SOLN
4.0000 mg | Freq: Once | INTRAMUSCULAR | Status: AC
  Administered 2017-02-28: 4 mg via INTRAVENOUS
  Filled 2017-02-28: qty 2

## 2017-02-28 MED ORDER — CHLORHEXIDINE GLUCONATE CLOTH 2 % EX PADS
6.0000 | MEDICATED_PAD | Freq: Every day | CUTANEOUS | Status: DC
  Administered 2017-03-01 – 2017-03-03 (×2): 6 via TOPICAL

## 2017-02-28 MED ORDER — TRAZODONE HCL 50 MG PO TABS
100.0000 mg | ORAL_TABLET | Freq: Every day | ORAL | Status: DC
  Administered 2017-02-28 – 2017-03-02 (×3): 100 mg via ORAL
  Filled 2017-02-28 (×3): qty 2

## 2017-02-28 MED ORDER — TIOTROPIUM BROMIDE MONOHYDRATE 18 MCG IN CAPS
1.0000 | ORAL_CAPSULE | Freq: Every day | RESPIRATORY_TRACT | Status: DC
  Administered 2017-03-01 – 2017-03-03 (×3): 18 ug via RESPIRATORY_TRACT
  Filled 2017-02-28: qty 5

## 2017-02-28 MED ORDER — DIGOXIN 125 MCG PO TABS
0.1250 mg | ORAL_TABLET | ORAL | Status: DC
  Administered 2017-02-28 – 2017-03-02 (×2): 0.125 mg via ORAL
  Filled 2017-02-28 (×2): qty 1

## 2017-02-28 MED ORDER — ACETAMINOPHEN 650 MG RE SUPP: 650.0000 mg | Freq: Four times a day (QID) | RECTAL | Status: DC | PRN

## 2017-02-28 MED ORDER — DULOXETINE HCL 60 MG PO CPEP
60.0000 mg | ORAL_CAPSULE | Freq: Every day | ORAL | Status: DC
  Administered 2017-02-28 – 2017-03-03 (×4): 60 mg via ORAL
  Filled 2017-02-28 (×4): qty 1

## 2017-02-28 MED ORDER — LOPERAMIDE HCL 2 MG PO CAPS: 2.0000 mg | ORAL_CAPSULE | ORAL | Status: DC | PRN

## 2017-02-28 MED ORDER — DILTIAZEM HCL ER COATED BEADS 180 MG PO CP24
180.0000 mg | ORAL_CAPSULE | Freq: Every day | ORAL | Status: DC
  Administered 2017-02-28 – 2017-03-03 (×4): 180 mg via ORAL
  Filled 2017-02-28 (×4): qty 1

## 2017-02-28 MED ORDER — IMIPENEM-CILASTATIN 500 MG IV SOLR
500.0000 mg | Freq: Once | INTRAVENOUS | Status: AC
  Administered 2017-02-28: 500 mg via INTRAVENOUS
  Filled 2017-02-28: qty 500

## 2017-02-28 MED ORDER — BISACODYL 5 MG PO TBEC: 5.0000 mg | DELAYED_RELEASE_TABLET | Freq: Every day | ORAL | Status: DC | PRN

## 2017-02-28 MED ORDER — ERGOCALCIFEROL 1.25 MG (50000 UT) PO CAPS
50000.0000 [IU] | ORAL_CAPSULE | ORAL | Status: DC
  Administered 2017-03-01: 50000 [IU] via ORAL
  Filled 2017-02-28 (×2): qty 1

## 2017-02-28 MED ORDER — MODAFINIL 200 MG PO TABS
200.0000 mg | ORAL_TABLET | Freq: Every day | ORAL | Status: DC
  Administered 2017-02-28 – 2017-03-03 (×4): 200 mg via ORAL
  Filled 2017-02-28 (×4): qty 1

## 2017-02-28 MED ORDER — ZOLPIDEM TARTRATE 5 MG PO TABS: 5.0000 mg | ORAL_TABLET | Freq: Every evening | ORAL | Status: DC | PRN

## 2017-02-28 MED ORDER — ACETAMINOPHEN 325 MG PO TABS: 650.0000 mg | ORAL_TABLET | Freq: Four times a day (QID) | ORAL | Status: DC | PRN

## 2017-02-28 MED ORDER — ONDANSETRON HCL 4 MG/2ML IJ SOLN: 4.0000 mg | Freq: Four times a day (QID) | INTRAMUSCULAR | Status: DC | PRN

## 2017-02-28 MED ORDER — SODIUM CHLORIDE 0.9 % IV SOLN
500.0000 mg | Freq: Four times a day (QID) | INTRAVENOUS | Status: DC
  Filled 2017-02-28 (×5): qty 500

## 2017-02-28 MED ORDER — FERROUS SULFATE 325 (65 FE) MG PO TABS
325.0000 mg | ORAL_TABLET | Freq: Every day | ORAL | Status: DC
  Administered 2017-03-01 – 2017-03-03 (×3): 325 mg via ORAL
  Filled 2017-02-28 (×3): qty 1

## 2017-02-28 MED ORDER — MUPIROCIN 2 % EX OINT
1.0000 "application " | TOPICAL_OINTMENT | Freq: Two times a day (BID) | CUTANEOUS | Status: DC
  Administered 2017-02-28 – 2017-03-03 (×6): 1 via NASAL
  Filled 2017-02-28: qty 22

## 2017-02-28 MED ORDER — MOMETASONE FURO-FORMOTEROL FUM 200-5 MCG/ACT IN AERO
2.0000 | INHALATION_SPRAY | Freq: Two times a day (BID) | RESPIRATORY_TRACT | Status: DC
  Administered 2017-02-28 – 2017-03-03 (×6): 2 via RESPIRATORY_TRACT
  Filled 2017-02-28: qty 8.8

## 2017-02-28 MED ORDER — MORPHINE SULFATE (PF) 4 MG/ML IV SOLN
4.0000 mg | Freq: Once | INTRAVENOUS | Status: AC
  Administered 2017-02-28: 4 mg via INTRAVENOUS
  Filled 2017-02-28: qty 1

## 2017-02-28 MED ORDER — METHADONE HCL 10 MG PO TABS
20.0000 mg | ORAL_TABLET | Freq: Two times a day (BID) | ORAL | Status: DC
  Administered 2017-02-28 – 2017-03-03 (×7): 20 mg via ORAL
  Filled 2017-02-28 (×7): qty 2

## 2017-02-28 MED ORDER — ENOXAPARIN SODIUM 40 MG/0.4ML ~~LOC~~ SOLN: 40.0000 mg | SUBCUTANEOUS | Status: DC

## 2017-02-28 MED ORDER — APIXABAN 5 MG PO TABS
5.0000 mg | ORAL_TABLET | Freq: Two times a day (BID) | ORAL | Status: DC
  Administered 2017-02-28 – 2017-03-03 (×7): 5 mg via ORAL
  Filled 2017-02-28 (×7): qty 1

## 2017-02-28 MED ORDER — SODIUM CHLORIDE 0.9 % IV SOLN
1.0000 g | Freq: Three times a day (TID) | INTRAVENOUS | Status: DC
  Administered 2017-02-28 – 2017-03-02 (×6): 1 g via INTRAVENOUS
  Filled 2017-02-28 (×10): qty 1

## 2017-02-28 MED ORDER — METOPROLOL TARTRATE 25 MG PO TABS
25.0000 mg | ORAL_TABLET | Freq: Two times a day (BID) | ORAL | Status: DC
  Administered 2017-02-28 – 2017-03-03 (×7): 25 mg via ORAL
  Filled 2017-02-28 (×7): qty 1

## 2017-02-28 MED ORDER — PANTOPRAZOLE SODIUM 40 MG PO TBEC
40.0000 mg | DELAYED_RELEASE_TABLET | Freq: Every day | ORAL | Status: DC
  Administered 2017-02-28 – 2017-03-03 (×4): 40 mg via ORAL
  Filled 2017-02-28 (×4): qty 1

## 2017-02-28 MED ORDER — ARMODAFINIL 250 MG PO TABS: 250.0000 mg | ORAL_TABLET | Freq: Every day | ORAL | Status: DC

## 2017-02-28 MED ORDER — ONDANSETRON HCL 4 MG PO TABS: 4.0000 mg | ORAL_TABLET | Freq: Four times a day (QID) | ORAL | Status: DC | PRN

## 2017-02-28 MED ORDER — LEVALBUTEROL HCL 1.25 MG/0.5ML IN NEBU
1.2500 mg | INHALATION_SOLUTION | Freq: Four times a day (QID) | RESPIRATORY_TRACT | Status: DC | PRN
  Filled 2017-02-28: qty 0.5

## 2017-02-28 MED ORDER — ALBUTEROL SULFATE HFA 108 (90 BASE) MCG/ACT IN AERS: 2.0000 | INHALATION_SPRAY | RESPIRATORY_TRACT | Status: DC | PRN

## 2017-02-28 NOTE — ED Notes (Signed)
DNR bracelet

## 2017-02-28 NOTE — Progress Notes (Signed)
Pharmacy Antibiotic Note  Dale Shields is a 66 y.o. male admitted on 02/28/2017 with UTI.  Pharmacy has been consulted for Meropenem dosing.  Plan: Meropenem 1gm IV every 8 hours. Monitor labs, micro and vitals.   Height: 5\' 8"  (172.7 cm) Weight: 235 lb 14.3 oz (107 kg) IBW/kg (Calculated) : 68.4  Temp (24hrs), Avg:98 F (36.7 C), Min:97.9 F (36.6 C), Max:98.1 F (36.7 C)  Recent Labs  Lab 02/28/17 0550  WBC 13.3*  CREATININE 1.41*    Estimated Creatinine Clearance: 61.9 mL/min (A) (by C-G formula based on SCr of 1.41 mg/dL (H)).    Allergies  Allergen Reactions  . Ultram [Tramadol Hcl] Other (See Comments)    Seizures; "grand mal; twice"  . Gabapentin Other (See Comments)    Made pt dilusional  . Lyrica [Pregabalin] Other (See Comments)    Leg swelling.   Antimicrobials this admission: Primaxin 11/20 x 1 Meropenem 11/20 >>   Dose adjustments this admission: n/a   Microbiology results:  BCx:  11/20 UCx: pending   Sputum:    MRSA PCR:   Thank you for allowing pharmacy to be a part of this patient's care.  Pricilla Larsson 02/28/2017 11:01 AM

## 2017-02-28 NOTE — H&P (Signed)
TRH H&P   Patient Demographics:    Dale Shields, is a 66 y.o. male  MRN: [id number]   DOB - 05-15-1950  Admit Date - 02/28/2017  Outpatient Primary MD for the patient is Monico Blitz, MD  Referring MD: Dr. Wyvonnia Dusky  Outpatient Specialists:  Dr. Melvyn Novas ( pulmonary), Dr Einar Gip ( cardiology)  Patient coming from: [facility]  Chief Complaint  Patient presents with  . Abdominal Pain    pain management      HPI:    Dale Shields  is a 66 y.o. male, with history of coronary artery disease with NST EMI s/p stents, a flutter status post cardioversion in July 2018 , on anticoagulation, anxiety, depression, COPD with chronic respiratory failure on 2 L oxygen, multiple sclerosis with chronic indwelling Foley ( changed every month at the facility) with recurrent UTIs, OSA intolerant to CPAP, hypertension, hyperlipidemia and chronic pain on methadone who was sent from skilled nursing facility with acute onset of right lower quadrant abdominal pain since early this morning.  Patient reports dull aching pain in the right lower abdomen occasionally radiating to the right flank.  He reports some burning urination for past 1 week.  Denied any fevers,headache, nausea, vomiting, dizziness, chest pain, shortness of breath, palpitations, diarrhea or blood in urine.  Patient denies any new medications or recent illness, sick contacts.  He reports that he ran out of his methadone for 3 days last week and was restarted a few days ago.  Reports that methadone does not help with his pain symptoms.   In the ED he reported having some chills.  In the ED vitals were stable. The  Foley was draining cloudy urine with changes of blood and large amount of sediment.  Foley was removed and replaced and drained about 700 cc of dark urine..  Nurse noted bloody discharge leaking from the penis during Foley removal.   Blood work showed WBC of 13 K, hemoglobin of 9.3 and normal platelets.  Chemistry showed elevated creatinine of 1.41.  UA was positive for UTI with large amount of leukocytes and bacteria.   Patient has had multiple episodes of E. coli and Proteus UTI which were pansensitive but his last UTI earlier this year grew Pseudomonas and Enterobacter which were sensitive to quinolones and imipenem only.  CT of the abdomen and pelvis done showed no signs of hydronephrosis, pyelonephritis or renal stones.  Also comments on  bilateral femoral head avascular necrosis ( not mentioned on prior CT scans).  Patient given a dose of IV Primaxin and hospitalist consulted for observation for UTI.   Review of systems:    In addition to the HPI above, (positive symptoms in bold) No Fever, chills++, No Headache, No changes with Vision or hearing, No problems swallowing food or Liquids, No Chest pain, Cough or Shortness of Breath, Right sided abdominal pain with occasional  flank pain, no Nausea or Vommitting, Bowel movements are regular, No Blood in stool, blood-tinged urine Dysuria + No new skin rashes or bruises, No new joints pains-aches,  No new weakness, tingling, numbness in any extremity, No recent weight gain or loss, No polyuria, polydypsia or polyphagia, No significant Mental Stressors.  A full 10 point Review of Systems was done, except as stated above, all other Review of Systems were negative.   With Past History of the following :    Past Medical History:  Diagnosis Date  . Anemia of chronic disease   . Anxiety   . Anxiety and depression   . BPH (benign prostatic hypertrophy)   . CAD (coronary artery disease) 11/2011   Stents x 3  . Carpal tunnel syndrome   . COPD (chronic obstructive pulmonary disease) ([place])   . DDD (degenerative disc disease)    knees, hips, shoulders--Dr. Ronnie Derby [facility] prior to this)  . Depression   . Dyspnea   . Endocarditis 2012  . Exertional dyspnea   .  Fibromyalgia   . GERD (gastroesophageal reflux disease)   . H/O hiatal hernia   . Heart murmur   . History of blood transfusion    "gave me too much coumadin and heparin once"  . History of epistaxis   . History of gout   . History of MRSA infection 2012   UTI, bacteremia, and endocarditis  . History of renal failure 2011   "on dialysis 3 times over 2 years"  . HLD (hyperlipidemia)   . Hydronephrosis   . Hypertension   . Hypothyroidism   . Insomnia   . Multiple sclerosis ([place])    with optic neuritis  . Myocardial infarction [facility])    "according to stress test; that's news to me"  . Narcolepsy   . Necrotizing fasciitis ([place])   . Neurogenic bladder    chronic indwelling foley catheter-changed monthly  . Optic neuritis   . OSA (obstructive sleep apnea)    "tried CPAP; lost more sleep w/use"  . Osteoarthritis of multiple joints    "everywhere"  . Pulmonary embolism ([place])    "suspected; never confirmed"--? treated with coumadin  . Scoliosis   . Steroid-induced psychosis   . Supraspinatus tendon tear 2012  . Thrombocytopenia ([place])       Past Surgical History:  Procedure Laterality Date  . CARDIOVERSION N/A 11/08/2016   Performed by Adrian Prows, MD at [facility] ENDOSCOPY  . CARPAL TUNNEL RELEASE  ~ 2010   left with radius fracture repair  . COLONOSCOPY  2000; 2005; 03/15/2011   diminutve adenoma--recall 2017  . CORONARY ANGIOPLASTY  11/22/2011   3 vessels  . CORONARY ANGIOPLASTY WITH STENT PLACEMENT  11/22/2011   DES to LAD  . ESOPHAGOGASTRODUODENOSCOPY (EGD) N/A 03/04/2016   Performed by Manus Gunning, MD at [place]  . KNEE ARTHROSCOPY W/ MENISCAL REPAIR  1990's   right  . LEFT HEART CATHETERIZATION WITH CORONARY ANGIOGRAM N/A 11/22/2011   Performed by Laverda Page, MD at [facility] CATH LAB  . nasal cautery    . PERCUTANEOUS CORONARY STENT INTERVENTION (PCI-S)  11/22/2011   Performed by Laverda Page, MD at [facility] CATH LAB  . SKIN GRAFT  04/2009   right thigh to  right "wrist all the way to my shoulder; necrotizing fasciitis      Social History:     Social History   Tobacco Use  . Smoking status: Former Smoker    Packs/day: 1.00  Years: 40.00    Pack years: 40.00    Types: Cigarettes    Last attempt to quit: 11/27/2016    Years since quitting: 0.2  . Smokeless tobacco: Never Used  Substance Use Topics  . Alcohol use: No    Comment: 11/22/2011 "like crazy til 1988; nothing since"     Lives -at skilled nursing facility  Mobility -wheelchair-bound     Family History :     Family History  Problem Relation Age of Onset  . Alzheimer's disease Mother   . Colon cancer Neg Hx       Home Medications:   Prior to Admission medications   Medication Sig Start Date End Date Taking? Authorizing Provider  acetaminophen (TYLENOL) 650 MG CR tablet Take 650 mg by mouth every 4 (four) hours as needed for pain.    [provider]  albuterol (PROVENTIL HFA;VENTOLIN HFA) 108 (90 Base) MCG/ACT inhaler Inhale 2 puffs into the lungs as needed for wheezing or shortness of breath.    [provider]  apixaban (ELIQUIS) 5 MG TABS tablet Take 5 mg by mouth 2 (two) times daily.    [provider]  Armodafinil 250 MG tablet Take 250 mg by mouth daily.    [provider]  atorvastatin (LIPITOR) 10 MG tablet Take 10 mg by mouth daily.    [provider]  budesonide-formoterol (SYMBICORT) 160-4.5 MCG/ACT inhaler Inhale 2 puffs into the lungs 2 (two) times daily. 02/06/17   Tanda Rockers, MD  clonazePAM (KLONOPIN) 0.5 MG tablet Take 0.5 mg by mouth 2 (two) times daily as needed for anxiety.    [provider]  digoxin (LANOXIN) 0.125 MG tablet Take 0.125 mg by mouth every other day.     [provider]  diltiazem (CARDIZEM LA) 180 MG 24 hr tablet Take 180 mg by mouth daily.     [provider]  DULoxetine (CYMBALTA) 60 MG capsule TAKE (1) CAPSULE BY MOUTH TWICE DAILY. 02/18/16   Norman Clay, MD  ergocalciferol (VITAMIN D2) 50000 units capsule Take 50,000 Units by mouth once a week.    [provider]  eszopiclone (LUNESTA) 1 MG TABS tablet Take 1 mg by mouth at bedtime as needed for sleep. Take immediately before bedtime    [provider]  ferrous sulfate 325 (65 FE) MG EC tablet Take 325 mg by mouth daily with breakfast.    [provider]  guaiFENesin-dextromethorphan (ROBITUSSIN DM) 100-10 MG/5ML syrup Take 5 mLs by mouth every 4 (four) hours as needed for cough.    [provider]  levalbuterol Penne Lash) 1.25 MG/3ML nebulizer solution Take 1.25 mg by nebulization every 6 (six) hours as needed for wheezing.    [provider]  loperamide (IMODIUM) 2 MG capsule Take 2 mg by mouth as needed for diarrhea or loose stools.    [provider]  methadone (DOLOPHINE) 10 MG tablet Take 20 mg by mouth every 12 (twelve) hours.     [provider]  metoprolol tartrate (LOPRESSOR) 25 MG tablet Take 25 mg by mouth 2 (two) times daily.     [provider]  nitroGLYCERIN (NITROSTAT) 0.3 MG SL tablet Place 0.4 mg under the tongue every 5 (five) minutes as needed for chest pain.    [provider]  omeprazole (PRILOSEC) 40 MG capsule Take 40 mg by mouth 2 (two) times daily.    [provider]  OXYGEN 2lpm 24/7    [provider]  Tiotropium  Bromide Monohydrate (SPIRIVA RESPIMAT) 2.5 MCG/ACT AERS Inhale 2 puffs into the lungs daily. 02/06/17   Tanda Rockers, MD  traZODone (DESYREL) 50 MG tablet Take 2 tablets by mouth at bedtime.  02/15/16   [provider]  varenicline (CHANTIX) 1 MG tablet Take 1 mg by mouth 2 (two) times daily.    [provider]     Allergies:     Allergies  Allergen Reactions  . Ultram [Tramadol Hcl] Other (See Comments)    Seizures; "grand mal; twice"  . Gabapentin Other (See Comments)    Made pt dilusional  . Lyrica [Pregabalin] Other (See  Comments)    Leg swelling.     Physical Exam:   Vitals  Blood pressure 127/63, pulse 66, temperature 98 F (36.7 C), resp. rate 17, height 5\' 8"  (1.727 m), weight 104.3 kg (230 lb), SpO2 99 %.   General:Elderly obese male lying in bed not in distress HEENT: Pupils reactive bilaterally, EOMI, pallor present, no icterus, moist oral mucosa, supple neck Chest: Clear to auscultation bilaterally, no added sound CVS: Normal S1 and S2, no murmurs rubs or gallop GI: Soft, nondistended, bowel sounds present, right lower quadrant tenderness, mild right flank tenderness, chronic Foley draining dark urine Musculoskeletal: Warm, no edema CNS: Alert and oriented x3, nonfocal      Data Review:    CBC Recent Labs  Lab 02/28/17 0550  WBC 13.3*  HGB 9.3*  HCT 30.3*  PLT 316  MCV 99.0  MCH 30.4  MCHC 30.7  RDW 15.2  LYMPHSABS 1.4  MONOABS 1.0  EOSABS 0.5  BASOSABS 0.0   ------------------------------------------------------------------------------------------------------------------  Chemistries  Recent Labs  Lab 02/28/17 0550  NA 136  K 4.0  CL 97*  CO2 30  GLUCOSE 147*  BUN 22*  CREATININE 1.41*  CALCIUM 8.9  AST 18  ALT 16*  ALKPHOS 115  BILITOT 0.5   ------------------------------------------------------------------------------------------------------------------ estimated creatinine clearance is 61.2 mL/min (A) (by C-G formula based on SCr of 1.41 mg/dL (H)). ------------------------------------------------------------------------------------------------------------------ No results for input(s): TSH, T4TOTAL, T3FREE, THYROIDAB in the last 72 hours.  Invalid input(s): FREET3  Coagulation profile No results for input(s): INR, PROTIME in the last 168 hours. ------------------------------------------------------------------------------------------------------------------- No results for input(s): DDIMER in the last 72  hours. -------------------------------------------------------------------------------------------------------------------  Cardiac Enzymes No results for input(s): CKMB, TROPONINI, MYOGLOBIN in the last 168 hours.  Invalid input(s): CK ------------------------------------------------------------------------------------------------------------------    Component Value Date/Time   BNP 828.0 (H) 03/01/2016 1419     ---------------------------------------------------------------------------------------------------------------  Urinalysis    Component Value Date/Time   COLORURINE YELLOW 02/28/2017 0524   APPEARANCEUR CLOUDY (A) 02/28/2017 0524   LABSPEC 1.009 02/28/2017 0524   PHURINE 5.0 02/28/2017 0524   GLUCOSEU NEGATIVE 02/28/2017 0524   HGBUR LARGE (A) 02/28/2017 0524   BILIRUBINUR NEGATIVE 02/28/2017 0524   KETONESUR NEGATIVE 02/28/2017 0524   PROTEINUR 100 (A) 02/28/2017 0524   UROBILINOGEN 2.0 (H) 11/15/2014 1745   NITRITE NEGATIVE 02/28/2017 0524   LEUKOCYTESUR LARGE (A) 02/28/2017 0524    ----------------------------------------------------------------------------------------------------------------   Imaging Results:    Ct Abdomen Pelvis Wo Contrast  Result Date: 02/28/2017 CLINICAL DATA:  Right lower quadrant/ flank and bladder pain. Pain at Foley catheter after mild trauma to this region. EXAM: CT ABDOMEN AND PELVIS WITHOUT CONTRAST TECHNIQUE: Multidetector CT imaging of the abdomen and pelvis was performed following the standard protocol without IV contrast. COMPARISON:  Plain films of 08/18/2016. Most recent CT of 01/26/2016. FINDINGS: Lower chest: Right base scarring. Tiny hiatal hernia. Normal heart size.  Right-sided pleural thickening. Hepatobiliary: Mild hepatic steatosis, without focal liver lesion. Normal gallbladder, without biliary ductal dilatation. Pancreas: Fatty atrophy involving the pancreatic body, head, and uncinate process. No duct dilatation.  Spleen: Normal in size, without focal abnormality. Adrenals/Urinary Tract: Normal adrenal glands. Left greater than right renal cortical thinning. Bilateral extrarenal pelves. No renal calculi or hydronephrosis. No hydroureter or ureteric calculi. A Foley catheter is appropriately positioned, within the decompressed urinary bladder. Stomach/Bowel: Proximal gastric underdistention. Colonic stool burden suggests constipation. Normal terminal ileum. Posterior appendix, terminating at the right upper quadrant. Normal small bowel. Vascular/Lymphatic: Aortic and branch vessel atherosclerosis. No retroperitoneal or retrocrural adenopathy. Mildly prominent but not pathologically enlarged external iliac nodes are likely reactive. Reproductive: Normal prostate. Other: No significant free fluid. Musculoskeletal: Right greater than left avascular necrosis of the femoral heads. Borderline L5 vertebral body height loss is chronic and favored to be within normal variation. IMPRESSION: 1. No urinary tract calculi or hydronephrosis. No explanation for patient's symptoms. 2.  Aortic Atherosclerosis (ICD10-I70.0). 3.  Tiny hiatal hernia. 4. Hepatic steatosis. 5.  Possible constipation. 6. Bilateral femoral head avascular necrosis. Electronically Signed   By: Abigail Miyamoto M.D.   On: 02/28/2017 07:29    My personal review of EKG: Not done   Assessment & Plan:    Principal Problem:   Catheter-associated urinary tract infection ([place]) Place in observation.  Based on symptoms of right lower quadrant pain with mild right flank pain could have early acute pyelonephritis although CT abdomen is negative. Patient received Primaxin in the ED based on prior cultures.  I will monitor him on IV meropenem and narrow down antibiotic based on cultures.  Supportive care with IV fluids, antiemetics and Tylenol.  Active Problems:   AKI (acute kidney injury) ([place]) Likely secondary to UTI vs bladder outlet obstruction per ED note (Foley was  clogged with sediments and drained 700 cc after he was replaced).  Avoid nephrotoxins.  Hydration with normal saline.  Recheck labs in a.m.  A flutter ( CHADS2vasc score - 4) Had DC cardioversion done in July 2018.  Sees Dr. Einar Gip.  Continue metoprolol, Cardizem and eliquis.  Currently in sinus rhythm on monitor.  Check EKG.    Chronic pain syndrome Resume methadone.  Use Tylenol for his current right abdominal pain.     Multiple sclerosis ([place]) Wheelchair-bound.  Outpatient follow-up.    COPD  GOLD IV with chronic hypoxic respiratory failure Continue oxygen via nasal cannula, home inhalers and as needed nebs.    Anemia of chronic disease/iron deficiency anemia Continue iron supplement  Anxiety and depression Continue as needed Klonopin , trazodone and Cymbalta.   Coronary artery disease Continue beta-blocker, statin and digoxin.  Avascular necrosis of femur Seen on CT scan patient was not evident on prior CT.  Patient is wheelchair-bound from his MS.  Denies any hip pain.  Recommend outpatient follow-up.  DVT Prophylaxis : Eliquis  AM Labs Ordered, also please review Full Orders  Family Communication: Admission, patients condition and plan of care including tests being ordered have been discussed with the patient at bedside  Code Status DNR  Likely DC: Back to SNF tomorrow  Condition: Fair  Consults called: None  Admission status: Observation  Time spent in minutes : 45   Louellen Molder M.D on 02/28/2017 at 8:40 AM  Between 7am to 7pm - Pager - [phone]. After 7pm go to [email or web address] - password [facility]  [facility] - Office  [phone]

## 2017-02-28 NOTE — Progress Notes (Signed)
CRITICAL VALUE ALERT  Critical Value:  MRSA positive  Date & Time Notfied:  02/28/17 at 36  Provider Notified: Dr. Clementeen Graham  Orders Received/Actions taken: bactroban started per protocol

## 2017-02-28 NOTE — Care Management Obs Status (Signed)
[facility] NOTIFICATION   Patient Details  Name: Dale Shields MRN: [id number] Date of Birth: 25-Nov-1950   Medicare Observation Status Notification Given:  Yes    Sherald Barge, RN 02/28/2017, 1:49 PM

## 2017-02-28 NOTE — ED Triage Notes (Signed)
EMS called out to [facility] for "pain management" per staff. Staff reports that pt had Methadone yesterday at 4 am and afternoon dose around 4 pm. EMS reports staff says they cannot give until Rx comes in this morning. Pt c/o RLQ/flank /bladder pain. Pt has foley cath in place with excessive  sediment noted in tubing. Pt says tubing was "yanked" about 4 days ago and has been hurting since.

## 2017-02-28 NOTE — Progress Notes (Signed)
ANTIBIOTIC CONSULT NOTE - INITIAL  Pharmacy Consult for primaxin Indication: UTI  Allergies  Allergen Reactions  . Ultram [Tramadol Hcl] Other (See Comments)    Seizures; "grand mal; twice"  . Gabapentin Other (See Comments)    Made pt dilusional  . Lyrica [Pregabalin] Other (See Comments)    Leg swelling.    Patient Measurements: Height: 5\' 8"  (172.7 cm) Weight: 230 lb (104.3 kg) IBW/kg (Calculated) : 68.4   Vital Signs: Temp: 98 F (36.7 C) (11/20 0457) BP: 127/63 (11/20 0630) Pulse Rate: 66 (11/20 0630) Intake/Output from previous day: 11/19 0701 - 11/20 0700 In: -  Out: 700 [Urine:700] Intake/Output from this shift: No intake/output data recorded.  Labs: Recent Labs    02/28/17 0550  WBC 13.3*  HGB 9.3*  PLT 316  CREATININE 1.41*   Estimated Creatinine Clearance: 61.2 mL/min (A) (by C-G formula based on SCr of 1.41 mg/dL (H)). No results for input(s): VANCOTROUGH, VANCOPEAK, VANCORANDOM, GENTTROUGH, GENTPEAK, GENTRANDOM, TOBRATROUGH, TOBRAPEAK, TOBRARND, AMIKACINPEAK, AMIKACINTROU, AMIKACIN in the last 72 hours.   Microbiology: No results found for this or any previous visit (from the past 720 hour(s)).  Medical History: Past Medical History:  Diagnosis Date  . Anemia of chronic disease   . Anxiety   . Anxiety and depression   . BPH (benign prostatic hypertrophy)   . CAD (coronary artery disease) 11/2011   Stents x 3  . Carpal tunnel syndrome   . COPD (chronic obstructive pulmonary disease) ([place])   . DDD (degenerative disc disease)    knees, hips, shoulders--Dr. Ronnie Derby [facility] prior to this)  . Depression   . Dyspnea   . Endocarditis 2012  . Exertional dyspnea   . Fibromyalgia   . GERD (gastroesophageal reflux disease)   . H/O hiatal hernia   . Heart murmur   . History of blood transfusion    "gave me too much coumadin and heparin once"  . History of epistaxis   . History of gout   . History of MRSA infection 2012   UTI, bacteremia, and  endocarditis  . History of renal failure 2011   "on dialysis 3 times over 2 years"  . HLD (hyperlipidemia)   . Hydronephrosis   . Hypertension   . Hypothyroidism   . Insomnia   . Multiple sclerosis ([place])    with optic neuritis  . Myocardial infarction [facility])    "according to stress test; that's news to me"  . Narcolepsy   . Necrotizing fasciitis ([place])   . Neurogenic bladder    chronic indwelling foley catheter-changed monthly  . Optic neuritis   . OSA (obstructive sleep apnea)    "tried CPAP; lost more sleep w/use"  . Osteoarthritis of multiple joints    "everywhere"  . Pulmonary embolism ([place])    "suspected; never confirmed"--? treated with coumadin  . Scoliosis   . Steroid-induced psychosis   . Supraspinatus tendon tear 2012  . Thrombocytopenia ([facility])     Medications:  See medication history Assessment: 66 yo man to start primaxin for UTI.  He is afebrile, WBC 13.3.  He had a foley cath in place which has now been removed.  Goal of Therapy:  Eradication of infection  Plan:  Primaxin 500 mg IV q6 hours F/u renal function and cultures  Trevaris Pennella Poteet 02/28/2017,8:46 AM

## 2017-02-28 NOTE — ED Notes (Signed)
Pt came from [facility] with foley catheter in place. 16 Fr, tubing had copious amounts of sediment, cloudy urine, with tinges of blood. Foley removed per MD order- pt tolerated well. Upon removal bloody discharge leaking from penis.

## 2017-02-28 NOTE — ED Provider Notes (Signed)
[facility] EMERGENCY DEPARTMENT Provider Note   CSN: [id number] Arrival date & time: 02/28/17  0454     History   Chief Complaint Chief Complaint  Patient presents with  . Abdominal Pain    pain management    HPI Dale Shields is a 66 y.o. male.  Patient presents from nursing facility with right-sided abdominal pain that woke him from sleep.  Reports the pain is been constant since about 3 AM.  Associated with nausea.  No vomiting.  Patient normally takes methadone for pain control of his back and joints but the nursing home is out of this until later today.  Reports his last dose of methadone was yesterday.  His abdominal pain is new however he does not normally have abdominal pain.  Last bowel movement was yesterday.  No fever.  He does have indwelling Foley history of neurogenic bladder from multiple sclerosis.  He does not know the last time this was changed but it is supposed to be changed monthly.  Catheter appears dirty at this time.  No previous abdominal surgeries and he denies having his abdominal pain before.  He does have a history of acid reflux and ulcer disease but states this pain is different.  States compliance with his Prilosec.   The history is provided by the patient.    Past Medical History:  Diagnosis Date  . Anemia of chronic disease   . Anxiety   . Anxiety and depression   . BPH (benign prostatic hypertrophy)   . CAD (coronary artery disease) 11/2011   Stents x 3  . Carpal tunnel syndrome   . COPD (chronic obstructive pulmonary disease) ([place])   . DDD (degenerative disc disease)    knees, hips, shoulders--Dr. Ronnie [facility] prior to this)  . Depression   . Dyspnea   . Endocarditis 2012  . Exertional dyspnea   . Fibromyalgia   . GERD (gastroesophageal reflux disease)   . H/O hiatal hernia   . Heart murmur   . History of blood transfusion    "gave me too much coumadin and heparin once"  . History of epistaxis   . History of gout   . History of  MRSA infection 2012   UTI, bacteremia, and endocarditis  . History of renal failure 2011   "on dialysis 3 times over 2 years"  . HLD (hyperlipidemia)   . Hydronephrosis   . Hypertension   . Hypothyroidism   . Insomnia   . Multiple sclerosis ([place])    with optic neuritis  . Myocardial infarction [facility])    "according to stress test; that's news to me"  . Narcolepsy   . Necrotizing fasciitis ([place])   . Neurogenic bladder    chronic indwelling foley catheter-changed monthly  . Optic neuritis   . OSA (obstructive sleep apnea)    "tried CPAP; lost more sleep w/use"  . Osteoarthritis of multiple joints    "everywhere"  . Pulmonary embolism ([place])    "suspected; never confirmed"--? treated with coumadin  . Scoliosis   . Steroid-induced psychosis   . Supraspinatus tendon tear 2012  . Thrombocytopenia [facility])     Patient Active Problem List   Diagnosis Date Noted  . Chronic respiratory failure with hypoxia and hypercapnia ([place]) 12/02/2016  . Morbid obesity due to excess calories ([place]) 12/02/2016  . Atrial flutter ([place]) 11/07/2016  . SOB (shortness of breath) 03/01/2016  . Anemia 03/01/2016  . Major depressive disorder, recurrent episode, moderate ([place]) 01/15/2016  . Depression 01/11/2016  .  Hyperlipidemia 01/11/2016  . Anxiety 01/11/2016  . Chest pain 04/06/2015  . Hydronephrosis 11/18/2014  . Catheter-associated urinary tract infection ([place])   . Malnutrition of moderate degree ([place]) 11/17/2014  . Hematuria 11/16/2014  . Generalized weakness 11/16/2014  . FTT (failure to thrive) in adult 11/16/2014  . Anemia of chronic disease 11/16/2014  . COPD  GOLD IV 11/15/2014  . CAD (coronary artery disease) 11/15/2014  . Chronic pain syndrome 04/11/2013  . Multiple sclerosis ([place]) 04/11/2013    Past Surgical History:  Procedure Laterality Date  . CARDIOVERSION N/A 11/08/2016   Performed by Adrian Prows, MD at [facility] ENDOSCOPY  . CARPAL TUNNEL RELEASE  ~ 2010   left with radius fracture  repair  . COLONOSCOPY  2000; 2005; 03/15/2011   diminutve adenoma--recall 2017  . CORONARY ANGIOPLASTY  11/22/2011   3 vessels  . CORONARY ANGIOPLASTY WITH STENT PLACEMENT  11/22/2011   DES to LAD  . ESOPHAGOGASTRODUODENOSCOPY (EGD) N/A 03/04/2016   Performed by Manus Gunning, MD at [facility]  . KNEE ARTHROSCOPY W/ MENISCAL REPAIR  1990's   right  . LEFT HEART CATHETERIZATION WITH CORONARY ANGIOGRAM N/A 11/22/2011   Performed by Laverda Page, MD at [facility] CATH LAB  . nasal cautery    . PERCUTANEOUS CORONARY STENT INTERVENTION (PCI-S)  11/22/2011   Performed by Laverda Page, MD at [facility] CATH LAB  . SKIN GRAFT  04/2009   right thigh to right "wrist all the way to my shoulder; necrotizing fasciitis       Home Medications    Prior to Admission medications   Medication Sig Start Date End Date Taking? Authorizing Provider  acetaminophen (TYLENOL) 650 MG CR tablet Take 650 mg by mouth every 4 (four) hours as needed for pain.    [provider]  albuterol (PROVENTIL HFA;VENTOLIN HFA) 108 (90 Base) MCG/ACT inhaler Inhale 2 puffs into the lungs as needed for wheezing or shortness of breath.    [provider]  apixaban (ELIQUIS) 5 MG TABS tablet Take 5 mg by mouth 2 (two) times daily.    [provider]  Armodafinil 250 MG tablet Take 250 mg by mouth daily.    [provider]  atorvastatin (LIPITOR) 10 MG tablet Take 10 mg by mouth daily.    [provider]  budesonide-formoterol (SYMBICORT) 160-4.5 MCG/ACT inhaler Inhale 2 puffs into the lungs 2 (two) times daily. 02/06/17   Tanda Rockers, MD  clonazePAM (KLONOPIN) 0.5 MG tablet Take 0.5 mg by mouth 2 (two) times daily as needed for anxiety.    [provider]  digoxin (LANOXIN) 0.125 MG tablet Take 0.125 mg by mouth every other day.     [provider]  diltiazem (CARDIZEM LA) 180 MG 24 hr tablet Take 180 mg by mouth daily.     [provider]    DULoxetine (CYMBALTA) 60 MG capsule TAKE (1) CAPSULE BY MOUTH TWICE DAILY. 02/18/16   Norman Clay, MD  ergocalciferol (VITAMIN D2) 50000 units capsule Take 50,000 Units by mouth once a week.    [provider]  eszopiclone (LUNESTA) 1 MG TABS tablet Take 1 mg by mouth at bedtime as needed for sleep. Take immediately before bedtime    [provider]  ferrous sulfate 325 (65 FE) MG EC tablet Take 325 mg by mouth daily with breakfast.    [provider]  guaiFENesin-dextromethorphan (ROBITUSSIN DM) 100-10 MG/5ML syrup Take 5 mLs by mouth every 4 (four) hours as needed for  cough.    [provider]  levalbuterol (XOPENEX) 1.25 MG/3ML nebulizer solution Take 1.25 mg by nebulization every 6 (six) hours as needed for wheezing.    [provider]  loperamide (IMODIUM) 2 MG capsule Take 2 mg by mouth as needed for diarrhea or loose stools.    [provider]  methadone (DOLOPHINE) 10 MG tablet Take 20 mg by mouth every 12 (twelve) hours.     [provider]  metoprolol tartrate (LOPRESSOR) 25 MG tablet Take 25 mg by mouth 2 (two) times daily.     [provider]  nitroGLYCERIN (NITROSTAT) 0.3 MG SL tablet Place 0.4 mg under the tongue every 5 (five) minutes as needed for chest pain.    [provider]  omeprazole (PRILOSEC) 40 MG capsule Take 40 mg by mouth 2 (two) times daily.    [provider]  OXYGEN 2lpm 24/7    [provider]  Tiotropium Bromide Monohydrate (SPIRIVA RESPIMAT) 2.5 MCG/ACT AERS Inhale 2 puffs into the lungs daily. 02/06/17   Tanda Rockers, MD  traZODone (DESYREL) 50 MG tablet Take 2 tablets by mouth at bedtime.  02/15/16   [provider]  varenicline (CHANTIX) 1 MG tablet Take 1 mg by mouth 2 (two) times daily.    [provider]    Family History Family History  Problem Relation Age of Onset  . Alzheimer's disease Mother   . Colon cancer Neg Hx     Social  History Social History   Tobacco Use  . Smoking status: Former Smoker    Packs/day: 1.00    Years: 40.00    Pack years: 40.00    Types: Cigarettes    Last attempt to quit: 11/27/2016    Years since quitting: 0.2  . Smokeless tobacco: Never Used  Substance Use Topics  . Alcohol use: No    Comment: 11/22/2011 "like crazy til 1988; nothing since"  . Drug use: No    Comment: *8//13/13 "1986 til 1988 like crazy; nothing since"     Allergies   Ultram [tramadol hcl]; Gabapentin; and Lyrica [pregabalin]   Review of Systems Review of Systems  Constitutional: Positive for activity change and appetite change. Negative for fever.  HENT: Negative for congestion and rhinorrhea.   Respiratory: Negative for cough, chest tightness and shortness of breath.   Gastrointestinal: Positive for abdominal pain and nausea. Negative for constipation, diarrhea and vomiting.  Genitourinary: Positive for difficulty urinating and flank pain. Negative for dysuria and hematuria.  Musculoskeletal: Positive for arthralgias and myalgias.  Skin: Negative for rash.  Neurological: Positive for weakness. Negative for dizziness, light-headedness and headaches.    all other systems are negative except as noted in the HPI and PMH.    Physical Exam Updated Vital Signs BP (!) 151/71   Pulse 76   Temp 98 F (36.7 C)   Resp 20   Ht 5\' 8"  (1.727 m)   Wt 104.3 kg (230 lb)   SpO2 100%   BMI 34.97 kg/m   Physical Exam  Constitutional: He is oriented to person, place, and time. He appears well-developed and well-nourished. No distress.  Chronically ill appearing  HENT:  Head: Normocephalic and atraumatic.  Mouth/Throat: Oropharynx is clear and moist. No oropharyngeal exudate.  Eyes: Conjunctivae and EOM are normal. Pupils are equal, round, and reactive to light.  Neck: Normal range of motion. Neck supple.  No meningismus.  Cardiovascular: Normal rate, regular rhythm, normal heart sounds and intact distal  pulses.  No murmur heard. Pulmonary/Chest: Effort normal and breath sounds normal. No respiratory distress.  Abdominal: Soft. There is tenderness. There is no rebound and no guarding.  Distended. TTP RUQ and R mid abdomen  Genitourinary:  Genitourinary Comments: Foley in place with cloudy urine and sediment. No testicular tenderness  Musculoskeletal: Normal range of motion. He exhibits no edema or tenderness.  Neurological: He is alert and oriented to person, place, and time. No cranial nerve deficit. He exhibits normal muscle tone. Coordination normal.  No ataxia on finger to nose bilaterally. No pronator drift. 5/5 strength throughout. CN 2-12 intact.Equal grip strength. Sensation intact.   Skin: Skin is warm.  Psychiatric: He has a normal mood and affect. His behavior is normal.  Nursing note and vitals reviewed.    ED Treatments / Results  Labs (all labs ordered are listed, but only abnormal results are displayed) Labs Reviewed  CBC WITH DIFFERENTIAL/PLATELET - Abnormal; Notable for the following components:      Result Value   WBC 13.3 (*)    RBC 3.06 (*)    Hemoglobin 9.3 (*)    HCT 30.3 (*)    Neutro Abs 10.3 (*)    All other components within normal limits  COMPREHENSIVE METABOLIC PANEL - Abnormal; Notable for the following components:   Chloride 97 (*)    Glucose, Bld 147 (*)    BUN 22 (*)    Creatinine, Ser 1.41 (*)    Albumin 2.8 (*)    ALT 16 (*)    GFR calc non Af Amer 51 (*)    GFR calc Af Amer 59 (*)    All other components within normal limits  URINALYSIS, ROUTINE W REFLEX MICROSCOPIC - Abnormal; Notable for the following components:   APPearance CLOUDY (*)    Hgb urine dipstick LARGE (*)    Protein, ur 100 (*)    Leukocytes, UA LARGE (*)    Bacteria, UA MANY (*)    All other components within normal limits  URINE CULTURE  LIPASE, BLOOD    EKG  EKG Interpretation None       Radiology Ct Abdomen Pelvis Wo Contrast  Result Date:  02/28/2017 CLINICAL DATA:  Right lower quadrant/ flank and bladder pain. Pain at Foley catheter after mild trauma to this region. EXAM: CT ABDOMEN AND PELVIS WITHOUT CONTRAST TECHNIQUE: Multidetector CT imaging of the abdomen and pelvis was performed following the standard protocol without IV contrast. COMPARISON:  Plain films of 08/18/2016. Most recent CT of 01/26/2016. FINDINGS: Lower chest: Right base scarring. Tiny hiatal hernia. Normal heart size. Right-sided pleural thickening. Hepatobiliary: Mild hepatic steatosis, without focal liver lesion. Normal gallbladder, without biliary ductal dilatation. Pancreas: Fatty atrophy involving the pancreatic body, head, and uncinate process. No duct dilatation. Spleen: Normal in size, without focal abnormality. Adrenals/Urinary Tract: Normal adrenal glands. Left greater than right renal cortical thinning. Bilateral extrarenal pelves. No renal calculi or hydronephrosis. No hydroureter or ureteric calculi. A Foley catheter is appropriately positioned, within the decompressed urinary bladder. Stomach/Bowel: Proximal gastric underdistention. Colonic stool burden suggests constipation. Normal terminal ileum. Posterior appendix, terminating at the right upper quadrant. Normal small bowel. Vascular/Lymphatic: Aortic and branch vessel atherosclerosis. No retroperitoneal or retrocrural adenopathy. Mildly prominent but not pathologically enlarged external iliac nodes are likely reactive. Reproductive: Normal prostate. Other: No significant free fluid. Musculoskeletal: Right greater than left avascular necrosis of the femoral heads. Borderline L5 vertebral body height loss is chronic and favored to be within normal variation. IMPRESSION: 1. No urinary tract  calculi or hydronephrosis. No explanation for patient's symptoms. 2.  Aortic Atherosclerosis (ICD10-I70.0). 3.  Tiny hiatal hernia. 4. Hepatic steatosis. 5.  Possible constipation. 6. Bilateral femoral head avascular necrosis.  Electronically Signed   By: Abigail Miyamoto M.D.   On: 02/28/2017 07:29    Procedures Procedures (including critical care time)  Medications Ordered in ED Medications - No data to display   Initial Impression / Assessment and Plan / ED Course  I have reviewed the triage vital signs and the nursing notes.  Pertinent labs & imaging results that were available during my care of the patient were reviewed by me and considered in my medical decision making (see chart for details).    Patient with multiple sclerosis, chronic pain, neurogenic bladder with Foley in place presenting with right-sided abdominal pain that woke him from sleep about 3 AM.  Contrary to triage note this is not been going on for several days.  Does have chronic pain and takes methadone but does not usually have abdominal pain.  History of acid reflux and PUD.  Labs show urinary tract infection.  Culture sent.  Previous cultures have grown multidrug-resistant Enterobacter and Pseudomonas.  Patient is afebrile nontoxic-appearing CT scan will be obtained to evaluate his right-sided abdominal pain.  Foley catheter exchanged by nursing staff.  Labs with leukocytosis and AKI.  BP and mental status normal.   Treat UTI with imipenem given MDR organisms from previous culture. CT scan reassuring. No ureteral stones.  Admission for observation and IV antibiotics d/w Dr. Clementeen Graham.   Final Clinical Impressions(s) / ED Diagnoses   Final diagnoses:  Complicated UTI (urinary tract infection)    ED Discharge Orders    None       Terrian Ridlon, Annie [facility], MD 02/28/17 [phone]

## 2017-03-01 DIAGNOSIS — T83510A Infection and inflammatory reaction due to cystostomy catheter, initial encounter: Secondary | ICD-10-CM | POA: Diagnosis not present

## 2017-03-01 DIAGNOSIS — N179 Acute kidney failure, unspecified: Secondary | ICD-10-CM | POA: Diagnosis not present

## 2017-03-01 DIAGNOSIS — D638 Anemia in other chronic diseases classified elsewhere: Secondary | ICD-10-CM | POA: Diagnosis not present

## 2017-03-01 DIAGNOSIS — N390 Urinary tract infection, site not specified: Secondary | ICD-10-CM | POA: Diagnosis not present

## 2017-03-01 LAB — BASIC METABOLIC PANEL
Anion gap: 6 (ref 5–15)
BUN: 19 mg/dL (ref 6–20)
CHLORIDE: 99 mmol/L — AB (ref 101–111)
CO2: 31 mmol/L (ref 22–32)
CREATININE: 1.38 mg/dL — AB (ref 0.61–1.24)
Calcium: 8.8 mg/dL — ABNORMAL LOW (ref 8.9–10.3)
GFR, EST NON AFRICAN AMERICAN: 52 mL/min — AB (ref >60)
Glucose, Bld: 103 mg/dL — ABNORMAL HIGH (ref 65–99)
POTASSIUM: 4.1 mmol/L (ref 3.5–5.1)
SODIUM: 136 mmol/L (ref 135–145)

## 2017-03-01 LAB — HIV ANTIBODY (ROUTINE TESTING W REFLEX): HIV SCREEN 4TH GENERATION: NONREACTIVE

## 2017-03-01 MED ORDER — POLYETHYLENE GLYCOL 3350 17 G PO PACK
17.0000 g | PACK | Freq: Every day | ORAL | Status: DC
  Filled 2017-03-01 (×2): qty 1

## 2017-03-01 NOTE — Clinical Social Work Note (Addendum)
SW notified that pt admitted from [facility]. Contacted Conservation officer, historic buildings at [facility] to update. Pt is in long term care and they will accept him back at dc. Per Larene Beach, pt will need a new FL2 at dc. Per RN CM, no anticipated dc for today or tomorrow. Covering SW will follow up on Friday to further assess and assist with dc planning.  SW notified that pt requesting to see SW. When SW went to see pt, he stated he didn't ask for SW. He then asked if SW could bring him written information on [facility]. Pt states he has been at [facility] since May but lately it hasn't been going too well. He states his medications have been messed up but that the main person in charge has been on vacation and will be back next week so hopefully it will get better. Pt indicated that he is expecting to dc back to [facility] when stable.

## 2017-03-01 NOTE — Progress Notes (Signed)
[facility] PROGRESS NOTE  Dale Shields HAL:[id number] DOB: February 03, 1951 DOA: 02/28/2017   PCP: Monico Blitz, MD     Assessment/Plan: Principal Problem:   Catheter-associated urinary tract infection ([place]) Active Problems:   Chronic pain syndrome   Multiple sclerosis (HCC)   COPD  GOLD IV   AKI (acute kidney injury) ([place])   Anemia of chronic disease   UTI (urinary tract infection)   66 y.o. male, with history of coronary artery disease with NST EMI s/p stents, a flutter status post cardioversion in July 2018 , on anticoagulation, anxiety, depression, COPD with chronic respiratory failure on 2 L oxygen, multiple sclerosis with chronic indwelling Foley ( changed every month at the facility) with recurrent UTIs, OSA intolerant to CPAP, hypertension, hyperlipidemia and chronic pain on methadone who was sent from skilled nursing facility with acute onset of right lower quadrant abdominal pain . Patient now admitted for urinary tract infection. Patient has had multiple episodes of E. coli and Proteus UTI which were pansensitive but his last UTI earlier this year grew Pseudomonas and Enterobacter which were sensitive to quinolones and imipenem only.  CT of the abdomen and pelvis done showed no signs of hydronephrosis, pyelonephritis or renal stones   Assessment and plan    Catheter-associated urinary tract infection (HCC) Based on symptoms of right lower quadrant pain with mild right flank pain could have early acute pyelonephritis although CT abdomen is negative. Patient received Primaxin in the ED based on prior cultures.  Patient currently on meropenem and narrow down antibiotic based on cultures.  Urine culture positive for greater than 100,000 colonies of gram-negative rods No blood cultures Supportive care with IV fluids, antiemetics and Tylenol.      AKI (acute kidney injury) ([place]) Likely secondary to UTI vs bladder outlet obstruction per ED note (Foley was clogged  with sediments and drained 700 cc after he was replaced).  Avoid nephrotoxins.  Hydration with normal saline.   slight improvement in creatinine overnight 1.41>1.38   A flutter ( CHADS2vasc score - 4) Had DC cardioversion done in July 2018.  Sees Dr. Einar Gip.  Continue metoprolol, Cardizem and eliquis.  Currently in sinus rhythm on monitor.    EKF shows NSR with APC's     Chronic pain syndrome Resume methadone.  Use Tylenol for his current right abdominal pain.     Multiple sclerosis ([place]) Wheelchair-bound.  Outpatient follow-up.    COPD  GOLD IV with chronic hypoxic respiratory failure Continue oxygen via nasal cannula, home inhalers and as needed nebs.    Anemia of chronic disease/iron deficiency anemia Continue iron supplement  Anxiety and depression Continue as needed Klonopin , trazodone and Cymbalta.   Coronary artery disease Continue beta-blocker, statin and digoxin.  Avascular necrosis of femur Seen on CT scan patient was not evident on prior CT.  Patient is wheelchair-bound from his MS.  Denies any hip pain.  Recommend outpatient follow-up.     DVT prophylaxsis  eliquis   Code Status:  Full code   Family Communication: Discussed in detail with the patient, all imaging results, lab results explained to the patient   Disposition Plan:  Anticipate discharge pending further clarification on his antibiotic regimen      Consultants:  None  Procedures:  None  Antibiotics: Anti-infectives (From admission, onward)   Start     Dose/Rate Route Frequency Ordered Stop   02/28/17 1600  meropenem (MERREM) 1 g in sodium chloride 0.9 % 100 mL IVPB  1 g 200 mL/hr over 30 Minutes Intravenous Every 8 hours 02/28/17 1109     02/28/17 1400  imipenem-cilastatin (PRIMAXIN) 500 mg in sodium chloride 0.9 % 100 mL IVPB  Status:  Discontinued     500 mg 200 mL/hr over 30 Minutes Intravenous Every 6 hours 02/28/17 0853 02/28/17 1105   02/28/17 0745   imipenem-cilastatin (PRIMAXIN) 500 mg in sodium chloride 0.9 % 100 mL IVPB     500 mg 200 mL/hr over 30 Minutes Intravenous  Once 02/28/17 0736 02/28/17 0854         HPI/Subjective:  Afebrile overnight, feels that he is back to baseline, denies any nausea vomiting abdominal pain, offered to treat constipation and patient declined  Objective: Vitals:   02/28/17 2026 02/28/17 2034 03/01/17 0453 03/01/17 0801  BP:  (!) 110/49 (!) 120/57   Pulse:  78 64   Resp:   16   Temp:  98.7 F (37.1 C) 98.6 F (37 C)   TempSrc:  Oral Oral   SpO2: 97% 99% 99% 97%  Weight:      Height:        Intake/Output Summary (Last 24 hours) at 03/01/2017 1016 Last data filed at 03/01/2017 0457 Gross per 24 hour  Intake 2228.34 ml  Output 1450 ml  Net 778.34 ml    Exam:  Examination:  General exam: Appears calm and comfortable  Respiratory system: Clear to auscultation. Respiratory effort normal. Cardiovascular system: S1 & S2 heard, RRR. No JVD, murmurs, rubs, gallops or clicks. No pedal edema. Gastrointestinal system: Abdomen is nondistended, soft and nontender. No organomegaly or masses felt. Normal bowel sounds heard. Central nervous system: Alert and oriented. No focal neurological deficits. Extremities: Symmetric 5 x 5 power. Skin: No rashes, lesions or ulcers Psychiatry: Judgement and insight appear normal. Mood & affect appropriate.     Data Reviewed: I have personally reviewed following labs and imaging studies  Micro Results Recent Results (from the past 240 hour(s))  Urine Culture     Status: Abnormal (Preliminary result)   Collection Time: 02/28/17  5:24 AM  Result Value Ref Range Status   Specimen Description URINE, CLEAN CATCH  Final   Special Requests NONE  Final   Culture >=100,000 COLONIES/mL GRAM NEGATIVE RODS (A)  Final   Report Status PENDING  Incomplete  MRSA PCR Screening     Status: Abnormal   Collection Time: 02/28/17 12:16 PM  Result Value Ref Range Status    MRSA by PCR POSITIVE (A) NEGATIVE Final    Comment:        The GeneXpert MRSA Assay (FDA approved for NASAL specimens only), is one component of a comprehensive MRSA colonization surveillance program. It is not intended to diagnose MRSA infection nor to guide or monitor treatment for MRSA infections. RESULT CALLED TO, READ BACK BY AND VERIFIED WITH: STONE,B ON 02/28/17 AT 1615 BY LIY,C     Radiology Reports Ct Abdomen Pelvis Wo Contrast  Result Date: 02/28/2017 CLINICAL DATA:  Right lower quadrant/ flank and bladder pain. Pain at Foley catheter after mild trauma to this region. EXAM: CT ABDOMEN AND PELVIS WITHOUT CONTRAST TECHNIQUE: Multidetector CT imaging of the abdomen and pelvis was performed following the standard protocol without IV contrast. COMPARISON:  Plain films of 08/18/2016. Most recent CT of 01/26/2016. FINDINGS: Lower chest: Right base scarring. Tiny hiatal hernia. Normal heart size. Right-sided pleural thickening. Hepatobiliary: Mild hepatic steatosis, without focal liver lesion. Normal gallbladder, without biliary ductal dilatation. Pancreas: Fatty atrophy involving the pancreatic  body, head, and uncinate process. No duct dilatation. Spleen: Normal in size, without focal abnormality. Adrenals/Urinary Tract: Normal adrenal glands. Left greater than right renal cortical thinning. Bilateral extrarenal pelves. No renal calculi or hydronephrosis. No hydroureter or ureteric calculi. A Foley catheter is appropriately positioned, within the decompressed urinary bladder. Stomach/Bowel: Proximal gastric underdistention. Colonic stool burden suggests constipation. Normal terminal ileum. Posterior appendix, terminating at the right upper quadrant. Normal small bowel. Vascular/Lymphatic: Aortic and branch vessel atherosclerosis. No retroperitoneal or retrocrural adenopathy. Mildly prominent but not pathologically enlarged external iliac nodes are likely reactive. Reproductive: Normal  prostate. Other: No significant free fluid. Musculoskeletal: Right greater than left avascular necrosis of the femoral heads. Borderline L5 vertebral body height loss is chronic and favored to be within normal variation. IMPRESSION: 1. No urinary tract calculi or hydronephrosis. No explanation for patient's symptoms. 2.  Aortic Atherosclerosis (ICD10-I70.0). 3.  Tiny hiatal hernia. 4. Hepatic steatosis. 5.  Possible constipation. 6. Bilateral femoral head avascular necrosis. Electronically Signed   By: Abigail Miyamoto M.D.   On: 02/28/2017 07:29     CBC Recent Labs  Lab 02/28/17 0550  WBC 13.3*  HGB 9.3*  HCT 30.3*  PLT 316  MCV 99.0  MCH 30.4  MCHC 30.7  RDW 15.2  LYMPHSABS 1.4  MONOABS 1.0  EOSABS 0.5  BASOSABS 0.0    Chemistries  Recent Labs  Lab 02/28/17 0550 03/01/17 0356  NA 136 136  K 4.0 4.1  CL 97* 99*  CO2 30 31  GLUCOSE 147* 103*  BUN 22* 19  CREATININE 1.41* 1.38*  CALCIUM 8.9 8.8*  AST 18  --   ALT 16*  --   ALKPHOS 115  --   BILITOT 0.5  --    ------------------------------------------------------------------------------------------------------------------ estimated creatinine clearance is 63.3 mL/min (A) (by C-G formula based on SCr of 1.38 mg/dL (H)). ------------------------------------------------------------------------------------------------------------------ No results for input(s): HGBA1C in the last 72 hours. ------------------------------------------------------------------------------------------------------------------ No results for input(s): CHOL, HDL, LDLCALC, TRIG, CHOLHDL, LDLDIRECT in the last 72 hours. ------------------------------------------------------------------------------------------------------------------ No results for input(s): TSH, T4TOTAL, T3FREE, THYROIDAB in the last 72 hours.  Invalid input(s): FREET3 ------------------------------------------------------------------------------------------------------------------ No  results for input(s): VITAMINB12, FOLATE, FERRITIN, TIBC, IRON, RETICCTPCT in the last 72 hours.  Coagulation profile No results for input(s): INR, PROTIME in the last 168 hours.  No results for input(s): DDIMER in the last 72 hours.  Cardiac Enzymes No results for input(s): CKMB, TROPONINI, MYOGLOBIN in the last 168 hours.  Invalid input(s): CK ------------------------------------------------------------------------------------------------------------------ Invalid input(s): POCBNP   CBG: No results for input(s): GLUCAP in the last 168 hours.     Studies: Ct Abdomen Pelvis Wo Contrast  Result Date: 02/28/2017 CLINICAL DATA:  Right lower quadrant/ flank and bladder pain. Pain at Foley catheter after mild trauma to this region. EXAM: CT ABDOMEN AND PELVIS WITHOUT CONTRAST TECHNIQUE: Multidetector CT imaging of the abdomen and pelvis was performed following the standard protocol without IV contrast. COMPARISON:  Plain films of 08/18/2016. Most recent CT of 01/26/2016. FINDINGS: Lower chest: Right base scarring. Tiny hiatal hernia. Normal heart size. Right-sided pleural thickening. Hepatobiliary: Mild hepatic steatosis, without focal liver lesion. Normal gallbladder, without biliary ductal dilatation. Pancreas: Fatty atrophy involving the pancreatic body, head, and uncinate process. No duct dilatation. Spleen: Normal in size, without focal abnormality. Adrenals/Urinary Tract: Normal adrenal glands. Left greater than right renal cortical thinning. Bilateral extrarenal pelves. No renal calculi or hydronephrosis. No hydroureter or ureteric calculi. A Foley catheter is appropriately positioned, within the decompressed urinary bladder. Stomach/Bowel: Proximal gastric  underdistention. Colonic stool burden suggests constipation. Normal terminal ileum. Posterior appendix, terminating at the right upper quadrant. Normal small bowel. Vascular/Lymphatic: Aortic and branch vessel atherosclerosis. No  retroperitoneal or retrocrural adenopathy. Mildly prominent but not pathologically enlarged external iliac nodes are likely reactive. Reproductive: Normal prostate. Other: No significant free fluid. Musculoskeletal: Right greater than left avascular necrosis of the femoral heads. Borderline L5 vertebral body height loss is chronic and favored to be within normal variation. IMPRESSION: 1. No urinary tract calculi or hydronephrosis. No explanation for patient's symptoms. 2.  Aortic Atherosclerosis (ICD10-I70.0). 3.  Tiny hiatal hernia. 4. Hepatic steatosis. 5.  Possible constipation. 6. Bilateral femoral head avascular necrosis. Electronically Signed   By: Abigail Miyamoto M.D.   On: 02/28/2017 07:29      Lab Results  Component Value Date   HGBA1C  08/29/2009    5.6 (NOTE)                                                                       According to the ADA Clinical Practice Recommendations for 2011, when HbA1c is used as a screening test:   >=6.5%   Diagnostic of Diabetes Mellitus           (if abnormal result  is confirmed)  5.7-6.4%   Increased risk of developing Diabetes Mellitus  References:Diagnosis and Classification of Diabetes Mellitus,Diabetes TIRW,4315,40(GQQPY 1):S62-S69 and Standards of Medical Care in         Diabetes - 2011,Diabetes PPJK,9326,71  (Suppl 1):S11-S61.   Lab Results  Component Value Date   LDLCALC 68 09/29/2010   CREATININE 1.38 (H) 03/01/2017       Scheduled Meds: . apixaban  5 mg Oral BID  . atorvastatin  10 mg Oral Daily  . Chlorhexidine Gluconate Cloth  6 each Topical Q0600  . digoxin  0.125 mg Oral QODAY  . diltiazem  180 mg Oral Daily  . DULoxetine  60 mg Oral Daily  . ergocalciferol  50,000 Units Oral Q Wed  . ferrous sulfate  325 mg Oral Q breakfast  . methadone  20 mg Oral Q12H  . metoprolol tartrate  25 mg Oral BID  . modafinil  200 mg Oral Daily  . mometasone-formoterol  2 puff Inhalation BID  . mupirocin ointment  1 application Nasal BID  .  pantoprazole  40 mg Oral Daily  . polyethylene glycol  17 g Oral Daily  . tiotropium  1 capsule Inhalation Daily  . traZODone  100 mg Oral QHS  . varenicline  1 mg Oral BID   Continuous Infusions: . sodium chloride 100 mL/hr at 03/01/17 0629  . meropenem (MERREM) IV 1 g (03/01/17 0951)     LOS: 0 days    Time spent: >30 MINS    Reyne Dumas  [facility] Pager [phone]. If 7PM-7AM, please contact night-coverage at [email or web address], password [facility] 03/01/2017, 10:16 AM  LOS: 0 days

## 2017-03-02 DIAGNOSIS — M797 Fibromyalgia: Secondary | ICD-10-CM | POA: Diagnosis present

## 2017-03-02 DIAGNOSIS — T83518A Infection and inflammatory reaction due to other urinary catheter, initial encounter: Secondary | ICD-10-CM | POA: Diagnosis present

## 2017-03-02 DIAGNOSIS — T83510A Infection and inflammatory reaction due to cystostomy catheter, initial encounter: Secondary | ICD-10-CM | POA: Diagnosis not present

## 2017-03-02 DIAGNOSIS — J9611 Chronic respiratory failure with hypoxia: Secondary | ICD-10-CM | POA: Diagnosis present

## 2017-03-02 DIAGNOSIS — I4892 Unspecified atrial flutter: Secondary | ICD-10-CM | POA: Diagnosis present

## 2017-03-02 DIAGNOSIS — Z955 Presence of coronary angioplasty implant and graft: Secondary | ICD-10-CM | POA: Diagnosis not present

## 2017-03-02 DIAGNOSIS — I252 Old myocardial infarction: Secondary | ICD-10-CM | POA: Diagnosis not present

## 2017-03-02 DIAGNOSIS — Z888 Allergy status to other drugs, medicaments and biological substances status: Secondary | ICD-10-CM | POA: Diagnosis not present

## 2017-03-02 DIAGNOSIS — G894 Chronic pain syndrome: Secondary | ICD-10-CM | POA: Diagnosis present

## 2017-03-02 DIAGNOSIS — N179 Acute kidney failure, unspecified: Secondary | ICD-10-CM | POA: Diagnosis present

## 2017-03-02 DIAGNOSIS — I2510 Atherosclerotic heart disease of native coronary artery without angina pectoris: Secondary | ICD-10-CM | POA: Diagnosis present

## 2017-03-02 DIAGNOSIS — Z79891 Long term (current) use of opiate analgesic: Secondary | ICD-10-CM | POA: Diagnosis not present

## 2017-03-02 DIAGNOSIS — J449 Chronic obstructive pulmonary disease, unspecified: Secondary | ICD-10-CM | POA: Diagnosis present

## 2017-03-02 DIAGNOSIS — Y846 Urinary catheterization as the cause of abnormal reaction of the patient, or of later complication, without mention of misadventure at the time of the procedure: Secondary | ICD-10-CM | POA: Diagnosis present

## 2017-03-02 DIAGNOSIS — G35 Multiple sclerosis: Secondary | ICD-10-CM | POA: Diagnosis present

## 2017-03-02 DIAGNOSIS — N390 Urinary tract infection, site not specified: Secondary | ICD-10-CM | POA: Diagnosis present

## 2017-03-02 DIAGNOSIS — D638 Anemia in other chronic diseases classified elsewhere: Secondary | ICD-10-CM | POA: Diagnosis present

## 2017-03-02 DIAGNOSIS — Z66 Do not resuscitate: Secondary | ICD-10-CM | POA: Diagnosis present

## 2017-03-02 DIAGNOSIS — M879 Osteonecrosis, unspecified: Secondary | ICD-10-CM | POA: Diagnosis present

## 2017-03-02 DIAGNOSIS — F329 Major depressive disorder, single episode, unspecified: Secondary | ICD-10-CM | POA: Diagnosis present

## 2017-03-02 DIAGNOSIS — Z993 Dependence on wheelchair: Secondary | ICD-10-CM | POA: Diagnosis not present

## 2017-03-02 DIAGNOSIS — N319 Neuromuscular dysfunction of bladder, unspecified: Secondary | ICD-10-CM | POA: Diagnosis present

## 2017-03-02 DIAGNOSIS — F419 Anxiety disorder, unspecified: Secondary | ICD-10-CM | POA: Diagnosis present

## 2017-03-02 DIAGNOSIS — Z87891 Personal history of nicotine dependence: Secondary | ICD-10-CM | POA: Diagnosis not present

## 2017-03-02 DIAGNOSIS — I10 Essential (primary) hypertension: Secondary | ICD-10-CM | POA: Diagnosis present

## 2017-03-02 DIAGNOSIS — R1084 Generalized abdominal pain: Secondary | ICD-10-CM | POA: Diagnosis present

## 2017-03-02 DIAGNOSIS — N400 Enlarged prostate without lower urinary tract symptoms: Secondary | ICD-10-CM | POA: Diagnosis present

## 2017-03-02 LAB — COMPREHENSIVE METABOLIC PANEL WITH GFR
ALT: 14 U/L — ABNORMAL LOW (ref 17–63)
AST: 18 U/L (ref 15–41)
Albumin: 2.5 g/dL — ABNORMAL LOW (ref 3.5–5.0)
Alkaline Phosphatase: 106 U/L (ref 38–126)
Anion gap: 6 (ref 5–15)
BUN: 13 mg/dL (ref 6–20)
CO2: 31 mmol/L (ref 22–32)
Calcium: 8.5 mg/dL — ABNORMAL LOW (ref 8.9–10.3)
Chloride: 100 mmol/L — ABNORMAL LOW (ref 101–111)
Creatinine, Ser: 1.12 mg/dL (ref 0.61–1.24)
GFR calc Af Amer: >60 mL/min
GFR calc non Af Amer: >60 mL/min
Glucose, Bld: 108 mg/dL — ABNORMAL HIGH (ref 65–99)
Potassium: 4 mmol/L (ref 3.5–5.1)
Sodium: 137 mmol/L (ref 135–145)
Total Bilirubin: 0.4 mg/dL (ref 0.3–1.2)
Total Protein: 6.7 g/dL (ref 6.5–8.1)

## 2017-03-02 LAB — URINE CULTURE: Culture: >=100000 — AB

## 2017-03-02 LAB — OCCULT BLOOD X 1 CARD TO LAB, STOOL: FECAL OCCULT BLD: NEGATIVE

## 2017-03-02 MED ORDER — MORPHINE SULFATE (PF) 2 MG/ML IV SOLN
1.0000 mg | Freq: Four times a day (QID) | INTRAVENOUS | Status: DC | PRN
  Administered 2017-03-02 – 2017-03-03 (×3): 1 mg via INTRAVENOUS
  Filled 2017-03-02 (×3): qty 1

## 2017-03-02 MED ORDER — SULFAMETHOXAZOLE-TRIMETHOPRIM 800-160 MG PO TABS
1.0000 | ORAL_TABLET | Freq: Two times a day (BID) | ORAL | Status: DC
  Administered 2017-03-02 – 2017-03-03 (×3): 1 via ORAL
  Filled 2017-03-02 (×3): qty 1

## 2017-03-02 NOTE — Progress Notes (Signed)
[facility] PROGRESS NOTE  Dale Shields YSA:[id number] DOB: August 09, 1950 DOA: 02/28/2017   PCP: Monico Blitz, MD     Assessment/Plan: Principal Problem:   Catheter-associated urinary tract infection ([place]) Active Problems:   Chronic pain syndrome   Multiple sclerosis (HCC)   COPD  GOLD IV   AKI (acute kidney injury) ([place])   Anemia of chronic disease   UTI (urinary tract infection)   66 y.o. male, with history of coronary artery disease with NST EMI s/p stents, a flutter status post cardioversion in July 2018 , on anticoagulation, anxiety, depression, COPD with chronic respiratory failure on 2 L oxygen, multiple sclerosis with chronic indwelling Foley ( changed every month at the facility) with recurrent UTIs, OSA intolerant to CPAP, hypertension, hyperlipidemia and chronic pain on methadone who was sent from skilled nursing facility with acute onset of right lower quadrant abdominal pain . Patient now admitted for urinary tract infection. Patient has had multiple episodes of E. coli and Proteus UTI which were pansensitive but his last UTI earlier this year grew Pseudomonas and Enterobacter which were sensitive to quinolones and imipenem only.  CT of the abdomen and pelvis done showed no signs of hydronephrosis, pyelonephritis or renal stones   Assessment and plan  Catheter-associated urinary tract infection (HCC) Based on symptoms of right lower quadrant pain with mild right flank pain could have early acute pyelonephritis although CT abdomen is negative. Patient received Primaxin in the ED based on prior cultures.  Patient currently on meropenem  .  Urine culture positive for    >=100,000 COLONIES/mL ENTEROBACTER CLOACAE  Based on sensitivity will switch to bactrim per pharmacy,continue for another 7 days  No blood cultures Supportive care with IV fluids, antiemetics and Tylenol.      AKI (acute kidney injury) ([place]) Likely secondary to UTI vs bladder outlet  obstruction per ED note (Foley was clogged with sediments and drained 700 cc after he was replaced).  Avoid nephrotoxins.  Hydration with normal saline.   slight improvement in creatinine overnight 1.41>1.38>1.12   A flutter ( CHADS2vasc score - 4) Had DC cardioversion done in July 2018.  Sees Dr. Einar Gip.  Continue metoprolol, Cardizem and eliquis.  Currently in sinus rhythm on monitor.    EKF shows NSR with APC's     Chronic pain syndrome on methadone.  Use Tylenol for his current right abdominal pain.     Multiple sclerosis ([place]) Wheelchair-bound.  Outpatient follow-up.    COPD  GOLD IV with chronic hypoxic respiratory failure Continue oxygen via nasal cannula, home inhalers and as needed nebs.    Anemia of chronic disease/iron deficiency anemia Baseline hg around 9, will repeat  Check FOBT  Continue iron supplement  Anxiety and depression Continue as needed Klonopin , trazodone and Cymbalta.   Coronary artery disease Continue beta-blocker, statin and digoxin.  Avascular necrosis of femur Seen on CT scan patient was not evident on prior CT.  Patient is wheelchair-bound from his MS.  Denies any hip pain.  Recommend outpatient follow-up.     DVT prophylaxsis  eliquis   Code Status:  Full code   Family Communication: Discussed in detail with the patient, all imaging results, lab results explained to the patient   Disposition Plan:  Anticipate discharge in am       Consultants:  None  Procedures:  None  Antibiotics: Anti-infectives (From admission, onward)   Start     Dose/Rate Route Frequency Ordered Stop   02/28/17  1600  meropenem (MERREM) 1 g in sodium chloride 0.9 % 100 mL IVPB  Status:  Discontinued     1 g 200 mL/hr over 30 Minutes Intravenous Every 8 hours 02/28/17 1109 03/02/17 1015   02/28/17 1400  imipenem-cilastatin (PRIMAXIN) 500 mg in sodium chloride 0.9 % 100 mL IVPB  Status:  Discontinued     500 mg 200 mL/hr over 30 Minutes  Intravenous Every 6 hours 02/28/17 0853 02/28/17 1105   02/28/17 0745  imipenem-cilastatin (PRIMAXIN) 500 mg in sodium chloride 0.9 % 100 mL IVPB     500 mg 200 mL/hr over 30 Minutes Intravenous  Once 02/28/17 0736 02/28/17 0854         HPI/Subjective: Doing well , afebrile and stable overnight  Objective: Vitals:   03/01/17 2040 03/01/17 2136 03/02/17 0625 03/02/17 1001  BP:  (!) 123/40 (!) 123/42   Pulse:  76 63   Resp:  18 18   Temp:  98.4 F (36.9 C) 98.2 F (36.8 C)   TempSrc:  Oral Oral   SpO2: 100% 96% 98% 97%  Weight:      Height:        Intake/Output Summary (Last 24 hours) at 03/02/2017 1016 Last data filed at 03/02/2017 7846 Gross per 24 hour  Intake 315 ml  Output 3800 ml  Net -3485 ml    Exam:  Examination:  General exam: Appears calm and comfortable  Respiratory system: Clear to auscultation. Respiratory effort normal. Cardiovascular system: S1 & S2 heard, RRR. No JVD, murmurs, rubs, gallops or clicks. No pedal edema. Gastrointestinal system: Abdomen is nondistended, soft and nontender. No organomegaly or masses felt. Normal bowel sounds heard. Central nervous system: Alert and oriented. No focal neurological deficits. Extremities: Symmetric 5 x 5 power. Skin: No rashes, lesions or ulcers Psychiatry: Judgement and insight appear normal. Mood & affect appropriate.     Data Reviewed: I have personally reviewed following labs and imaging studies  Micro Results Recent Results (from the past 240 hour(s))  Urine Culture     Status: Abnormal   Collection Time: 02/28/17  5:24 AM  Result Value Ref Range Status   Specimen Description URINE, CLEAN CATCH  Final   Special Requests NONE  Final   Culture >=100,000 COLONIES/mL ENTEROBACTER CLOACAE (A)  Final   Report Status 03/02/2017 FINAL  Final   Organism ID, Bacteria ENTEROBACTER CLOACAE (A)  Final      Susceptibility   Enterobacter cloacae - MIC*    CEFAZOLIN >=64 RESISTANT Resistant      CEFTRIAXONE >=64 RESISTANT Resistant     CIPROFLOXACIN >=4 RESISTANT Resistant     GENTAMICIN <=1 SENSITIVE Sensitive     IMIPENEM 0.5 SENSITIVE Sensitive     NITROFURANTOIN 64 INTERMEDIATE Intermediate     TRIMETH/SULFA <=20 SENSITIVE Sensitive     PIP/TAZO >=128 RESISTANT Resistant     * >=100,000 COLONIES/mL ENTEROBACTER CLOACAE  MRSA PCR Screening     Status: Abnormal   Collection Time: 02/28/17 12:16 PM  Result Value Ref Range Status   MRSA by PCR POSITIVE (A) NEGATIVE Final    Comment:        The GeneXpert MRSA Assay (FDA approved for NASAL specimens only), is one component of a comprehensive MRSA colonization surveillance program. It is not intended to diagnose MRSA infection nor to guide or monitor treatment for MRSA infections. RESULT CALLED TO, READ BACK BY AND VERIFIED WITH: STONE,B ON 02/28/17 AT 9629 BY [facility]     Radiology Reports Ct Abdomen  Pelvis Wo Contrast  Result Date: 02/28/2017 CLINICAL DATA:  Right lower quadrant/ flank and bladder pain. Pain at Foley catheter after mild trauma to this region. EXAM: CT ABDOMEN AND PELVIS WITHOUT CONTRAST TECHNIQUE: Multidetector CT imaging of the abdomen and pelvis was performed following the standard protocol without IV contrast. COMPARISON:  Plain films of 08/18/2016. Most recent CT of 01/26/2016. FINDINGS: Lower chest: Right base scarring. Tiny hiatal hernia. Normal heart size. Right-sided pleural thickening. Hepatobiliary: Mild hepatic steatosis, without focal liver lesion. Normal gallbladder, without biliary ductal dilatation. Pancreas: Fatty atrophy involving the pancreatic body, head, and uncinate process. No duct dilatation. Spleen: Normal in size, without focal abnormality. Adrenals/Urinary Tract: Normal adrenal glands. Left greater than right renal cortical thinning. Bilateral extrarenal pelves. No renal calculi or hydronephrosis. No hydroureter or ureteric calculi. A Foley catheter is appropriately positioned, within the  decompressed urinary bladder. Stomach/Bowel: Proximal gastric underdistention. Colonic stool burden suggests constipation. Normal terminal ileum. Posterior appendix, terminating at the right upper quadrant. Normal small bowel. Vascular/Lymphatic: Aortic and branch vessel atherosclerosis. No retroperitoneal or retrocrural adenopathy. Mildly prominent but not pathologically enlarged external iliac nodes are likely reactive. Reproductive: Normal prostate. Other: No significant free fluid. Musculoskeletal: Right greater than left avascular necrosis of the femoral heads. Borderline L5 vertebral body height loss is chronic and favored to be within normal variation. IMPRESSION: 1. No urinary tract calculi or hydronephrosis. No explanation for patient's symptoms. 2.  Aortic Atherosclerosis (ICD10-I70.0). 3.  Tiny hiatal hernia. 4. Hepatic steatosis. 5.  Possible constipation. 6. Bilateral femoral head avascular necrosis. Electronically Signed   By: Abigail Miyamoto M.D.   On: 02/28/2017 07:29     CBC Recent Labs  Lab 02/28/17 0550  WBC 13.3*  HGB 9.3*  HCT 30.3*  PLT 316  MCV 99.0  MCH 30.4  MCHC 30.7  RDW 15.2  LYMPHSABS 1.4  MONOABS 1.0  EOSABS 0.5  BASOSABS 0.0    Chemistries  Recent Labs  Lab 02/28/17 0550 03/01/17 0356 03/02/17 0703  NA 136 136 137  K 4.0 4.1 4.0  CL 97* 99* 100*  CO2 30 31 31   GLUCOSE 147* 103* 108*  BUN 22* 19 13  CREATININE 1.41* 1.38* 1.12  CALCIUM 8.9 8.8* 8.5*  AST 18  --  18  ALT 16*  --  14*  ALKPHOS 115  --  106  BILITOT 0.5  --  0.4   ------------------------------------------------------------------------------------------------------------------ estimated creatinine clearance is 77.9 mL/min (by C-G formula based on SCr of 1.12 mg/dL). ------------------------------------------------------------------------------------------------------------------ No results for input(s): HGBA1C in the last 72  hours. ------------------------------------------------------------------------------------------------------------------ No results for input(s): CHOL, HDL, LDLCALC, TRIG, CHOLHDL, LDLDIRECT in the last 72 hours. ------------------------------------------------------------------------------------------------------------------ No results for input(s): TSH, T4TOTAL, T3FREE, THYROIDAB in the last 72 hours.  Invalid input(s): FREET3 ------------------------------------------------------------------------------------------------------------------ No results for input(s): VITAMINB12, FOLATE, FERRITIN, TIBC, IRON, RETICCTPCT in the last 72 hours.  Coagulation profile No results for input(s): INR, PROTIME in the last 168 hours.  No results for input(s): DDIMER in the last 72 hours.  Cardiac Enzymes No results for input(s): CKMB, TROPONINI, MYOGLOBIN in the last 168 hours.  Invalid input(s): CK ------------------------------------------------------------------------------------------------------------------ Invalid input(s): POCBNP   CBG: No results for input(s): GLUCAP in the last 168 hours.     Studies: No results found.    Lab Results  Component Value Date   HGBA1C  08/29/2009    5.6 (NOTE)  According to the ADA Clinical Practice Recommendations for 2011, when HbA1c is used as a screening test:   >=6.5%   Diagnostic of Diabetes Mellitus           (if abnormal result  is confirmed)  5.7-6.4%   Increased risk of developing Diabetes Mellitus  References:Diagnosis and Classification of Diabetes Mellitus,Diabetes XTAV,6979,48(AXKPV 1):S62-S69 and Standards of Medical Care in         Diabetes - 2011,Diabetes VZSM,2707,86  (Suppl 1):S11-S61.   Lab Results  Component Value Date   LDLCALC 68 09/29/2010   CREATININE 1.12 03/02/2017       Scheduled Meds: . apixaban  5 mg Oral BID  . atorvastatin  10 mg Oral Daily   . Chlorhexidine Gluconate Cloth  6 each Topical Q0600  . digoxin  0.125 mg Oral QODAY  . diltiazem  180 mg Oral Daily  . DULoxetine  60 mg Oral Daily  . ergocalciferol  50,000 Units Oral Q Wed  . ferrous sulfate  325 mg Oral Q breakfast  . methadone  20 mg Oral Q12H  . metoprolol tartrate  25 mg Oral BID  . modafinil  200 mg Oral Daily  . mometasone-formoterol  2 puff Inhalation BID  . mupirocin ointment  1 application Nasal BID  . pantoprazole  40 mg Oral Daily  . polyethylene glycol  17 g Oral Daily  . tiotropium  1 capsule Inhalation Daily  . traZODone  100 mg Oral QHS  . varenicline  1 mg Oral BID   Continuous Infusions: . sodium chloride 100 mL/hr at 03/02/17 0426     LOS: 0 days    Time spent: >30 MINS    Reyne Dumas  [facility] Pager [phone]. If 7PM-7AM, please contact night-coverage at [email or web address], password [facility] 03/02/2017, 10:16 AM  LOS: 0 days

## 2017-03-02 NOTE — Plan of Care (Signed)
Bedside shift report completed. Pt on contact precautions. Katharine Look, RN hung new bag of fluids for patient, due to line being complete. Pt states he has pain, but refuses medication at this time. He states he would like all of his medications at 21:00. Checked patient's foley (suprapubic), see documentation. No other requests noted at this time.

## 2017-03-02 NOTE — Progress Notes (Signed)
Pharmacy Antibiotic Note  Dale Shields is a 66 y.o. male admitted on 02/28/2017 with UTI.  Pharmacy has been consulted for SEPTRA dosing.  Plan: Septra DS 1 po BID (anticipate 3-5 days therapy) Monitor labs, micro and vitals.   Height: 5\' 8"  (172.7 cm) Weight: 235 lb 14.3 oz (107 kg) IBW/kg (Calculated) : 68.4  Temp (24hrs), Avg:98.4 F (36.9 C), Min:98.2 F (36.8 C), Max:98.5 F (36.9 C)  Recent Labs  Lab 02/28/17 0550 03/01/17 0356 03/02/17 0703  WBC 13.3*  --   --   CREATININE 1.41* 1.38* 1.12    Estimated Creatinine Clearance: 77.9 mL/min (by C-G formula based on SCr of 1.12 mg/dL).    Allergies  Allergen Reactions  . Ultram [Tramadol Hcl] Other (See Comments)    Seizures; "grand mal; twice"  . Gabapentin Other (See Comments)    Made pt dilusional  . Lyrica [Pregabalin] Other (See Comments)    Leg swelling.   Antimicrobials this admission: Primaxin 11/20 x 1 Meropenem 11/20 >> 11/22 Bactrim DS 11/22 >>  Recent Results (from the past 240 hour(s))  Urine Culture     Status: Abnormal   Collection Time: 02/28/17  5:24 AM  Result Value Ref Range Status   Specimen Description URINE, CLEAN CATCH  Final   Special Requests NONE  Final   Culture >=100,000 COLONIES/mL ENTEROBACTER CLOACAE (A)  Final   Report Status 03/02/2017 FINAL  Final   Organism ID, Bacteria ENTEROBACTER CLOACAE (A)  Final      Susceptibility   Enterobacter cloacae - MIC*    CEFAZOLIN >=64 RESISTANT Resistant     CEFTRIAXONE >=64 RESISTANT Resistant     CIPROFLOXACIN >=4 RESISTANT Resistant     GENTAMICIN <=1 SENSITIVE Sensitive     IMIPENEM 0.5 SENSITIVE Sensitive     NITROFURANTOIN 64 INTERMEDIATE Intermediate     TRIMETH/SULFA <=20 SENSITIVE Sensitive     PIP/TAZO >=128 RESISTANT Resistant     * >=100,000 COLONIES/mL ENTEROBACTER CLOACAE  MRSA PCR Screening     Status: Abnormal   Collection Time: 02/28/17 12:16 PM  Result Value Ref Range Status   MRSA by PCR POSITIVE (A) NEGATIVE  Final    Comment:        The GeneXpert MRSA Assay (FDA approved for NASAL specimens only), is one component of a comprehensive MRSA colonization surveillance program. It is not intended to diagnose MRSA infection nor to guide or monitor treatment for MRSA infections. RESULT CALLED TO, READ BACK BY AND VERIFIED WITH: STONE,B ON 02/28/17 AT 1615 BY LIY,C    Thank you for allowing pharmacy to be a part of this patient's care.  Hart Robinsons A 03/02/2017 10:24 AM

## 2017-03-03 DIAGNOSIS — D638 Anemia in other chronic diseases classified elsewhere: Secondary | ICD-10-CM

## 2017-03-03 DIAGNOSIS — N390 Urinary tract infection, site not specified: Secondary | ICD-10-CM

## 2017-03-03 DIAGNOSIS — T83510A Infection and inflammatory reaction due to cystostomy catheter, initial encounter: Secondary | ICD-10-CM

## 2017-03-03 DIAGNOSIS — N179 Acute kidney failure, unspecified: Secondary | ICD-10-CM

## 2017-03-03 LAB — BASIC METABOLIC PANEL
ANION GAP: 6 (ref 5–15)
BUN: 11 mg/dL (ref 6–20)
CALCIUM: 8.6 mg/dL — AB (ref 8.9–10.3)
CO2: 30 mmol/L (ref 22–32)
CREATININE: 1.09 mg/dL (ref 0.61–1.24)
Chloride: 98 mmol/L — ABNORMAL LOW (ref 101–111)
GFR calc Af Amer: >60 mL/min (ref >60)
GLUCOSE: 110 mg/dL — AB (ref 65–99)
Potassium: 3.9 mmol/L (ref 3.5–5.1)
Sodium: 134 mmol/L — ABNORMAL LOW (ref 135–145)

## 2017-03-03 LAB — CBC
HCT: 29.5 % — ABNORMAL LOW (ref 39.0–52.0)
Hemoglobin: 8.8 g/dL — ABNORMAL LOW (ref 13.0–17.0)
MCH: 29.8 pg (ref 26.0–34.0)
MCHC: 29.8 g/dL — AB (ref 30.0–36.0)
MCV: 100 fL (ref 78.0–100.0)
PLATELETS: 277 10*3/uL (ref 150–400)
RBC: 2.95 MIL/uL — ABNORMAL LOW (ref 4.22–5.81)
RDW: 15 % (ref 11.5–15.5)
WBC: 5.8 10*3/uL (ref 4.0–10.5)

## 2017-03-03 MED ORDER — SULFAMETHOXAZOLE-TRIMETHOPRIM 800-160 MG PO TABS: 1.0000 | ORAL_TABLET | Freq: Two times a day (BID) | ORAL | 0 refills | Status: DC

## 2017-03-03 MED ORDER — POLYETHYLENE GLYCOL 3350 17 G PO PACK: 17.0000 g | PACK | Freq: Every day | ORAL | 0 refills | Status: DC

## 2017-03-03 NOTE — NC FL2 (Signed)
[place] MEDICAID FL2 LEVEL OF CARE SCREENING TOOL     IDENTIFICATION  Patient Name: Dale Shields Birthdate: 11-08-1950 Sex: male Admission Date (Current Location): 02/28/2017  [facility] and [place] Number:  [employer] and Address:  [facility] [address], [place]      Provider Number: [id number]  Attending Physician Name and Address:  Kinnie Feil, MD  Relative Name and Phone Number:       Current Level of Care: Hospital Recommended Level of Care: [place] Prior Approval Number:    Date Approved/Denied:   PASRR Number:    Discharge Plan: SNF    Current Diagnoses: Patient Active Problem List   Diagnosis Date Noted  . UTI (urinary tract infection) 02/28/2017  . Chronic respiratory failure with hypoxia and hypercapnia ([place]) 12/02/2016  . Morbid obesity due to excess calories ([place]) 12/02/2016  . Atrial flutter ([place]) 11/07/2016  . SOB (shortness of breath) 03/01/2016  . Anemia 03/01/2016  . Major depressive disorder, recurrent episode, moderate ([place]) 01/15/2016  . Depression 01/11/2016  . Hyperlipidemia 01/11/2016  . Anxiety 01/11/2016  . Chest pain 04/06/2015  . Hydronephrosis 11/18/2014  . Catheter-associated urinary tract infection ([place])   . Malnutrition of moderate degree ([place]) 11/17/2014  . AKI (acute kidney injury) ([place]) 11/16/2014  . Hematuria 11/16/2014  . Generalized weakness 11/16/2014  . FTT (failure to thrive) in adult 11/16/2014  . Anemia of chronic disease 11/16/2014  . COPD  GOLD IV 11/15/2014  . CAD (coronary artery disease) 11/15/2014  . Chronic pain syndrome 04/11/2013  . Multiple sclerosis ([place]) 04/11/2013    Orientation RESPIRATION BLADDER Height & Weight     Self, Place  O2(2L) Indwelling catheter Weight: 235 lb 14.3 oz (107 kg) Height:  5\' 8"  (172.7 cm)  BEHAVIORAL SYMPTOMS/MOOD NEUROLOGICAL BOWEL NUTRITION STATUS      Continent Diet(Heart Healthy)  AMBULATORY STATUS  COMMUNICATION OF NEEDS Skin   Total Care(Patient uses electric wheelchair ) Verbally Normal                       Personal Care Assistance Level of Assistance  Bathing, Feeding, Dressing Bathing Assistance: Maximum assistance Feeding assistance: Limited assistance Dressing Assistance: Maximum assistance     Functional Limitations Info  Sight, Hearing, Speech Sight Info: Adequate Hearing Info: Adequate Speech Info: Adequate    SPECIAL CARE FACTORS FREQUENCY                       Contractures Contractures Info: Not present    Additional Factors Info  Code Status, Allergies, Isolation Precautions Code Status Info: Full Code Allergies Info: Ultram, Gabapentin, Lyrica     Isolation Precautions Info: 11.20.18 mrsa by pcr     Current Medications (03/03/2017):  This is the current hospital active medication list Current Facility-Administered Medications  Medication Dose Route Frequency Provider Last Rate Last Dose  . 0.9 %  sodium chloride infusion   Intravenous Continuous Reyne Dumas, MD 50 mL/hr at 03/02/17 1945    . acetaminophen (TYLENOL) tablet 650 mg  650 mg Oral Q6H PRN Dhungel, Nishant, MD       Or  . acetaminophen (TYLENOL) suppository 650 mg  650 mg Rectal Q6H PRN Dhungel, Nishant, MD      . apixaban (ELIQUIS) tablet 5 mg  5 mg Oral BID Dhungel, Nishant, MD   5 mg at 03/03/17 0944  . atorvastatin (LIPITOR) tablet 10 mg  10 mg  Oral Daily Dhungel, Nishant, MD   10 mg at 03/03/17 0944  . bisacodyl (DULCOLAX) EC tablet 5 mg  5 mg Oral Daily PRN Dhungel, Nishant, MD      . Chlorhexidine Gluconate Cloth 2 % PADS 6 each  6 each Topical Q0600 Dhungel, Nishant, MD   6 each at 03/03/17 0611  . clonazePAM (KLONOPIN) tablet 0.5 mg  0.5 mg Oral BID PRN Dhungel, Nishant, MD   0.5 mg at 03/02/17 0903  . digoxin (LANOXIN) tablet 0.125 mg  0.125 mg Oral QODAY Dhungel, Nishant, MD   0.125 mg at 03/02/17 0904  . diltiazem (CARDIZEM CD) 24 hr capsule 180 mg  180 mg Oral Daily  Dhungel, Nishant, MD   180 mg at 03/03/17 0944  . DULoxetine (CYMBALTA) DR capsule 60 mg  60 mg Oral Daily Dhungel, Nishant, MD   60 mg at 03/03/17 0944  . ergocalciferol (VITAMIN D2) capsule 50,000 Units  50,000 Units Oral Q Wed Dhungel, Nishant, MD   50,000 Units at 03/01/17 0951  . ferrous sulfate tablet 325 mg  325 mg Oral Q breakfast Dhungel, Nishant, MD   325 mg at 03/03/17 0944  . levalbuterol (XOPENEX) nebulizer solution 1.25 mg  1.25 mg Nebulization Q6H PRN Dhungel, Nishant, MD      . loperamide (IMODIUM) capsule 2 mg  2 mg Oral PRN Dhungel, Nishant, MD      . methadone (DOLOPHINE) tablet 20 mg  20 mg Oral Q12H Dhungel, Nishant, MD   20 mg at 03/03/17 0944  . metoprolol tartrate (LOPRESSOR) tablet 25 mg  25 mg Oral BID Dhungel, Nishant, MD   25 mg at 03/03/17 0944  . modafinil (PROVIGIL) tablet 200 mg  200 mg Oral Daily Dhungel, Nishant, MD   200 mg at 03/03/17 0944  . mometasone-formoterol (DULERA) 200-5 MCG/ACT inhaler 2 puff  2 puff Inhalation BID Dhungel, Nishant, MD   2 puff at 03/03/17 0806  . morphine 2 MG/ML injection 1 mg  1 mg Intravenous Q6H PRN Reyne Dumas, MD   1 mg at 03/03/17 0630  . mupirocin ointment (BACTROBAN) 2 % 1 application  1 application Nasal BID Dhungel, Nishant, MD   1 application at 32/67/12 0945  . nitroGLYCERIN (NITROSTAT) SL tablet 0.4 mg  0.4 mg Sublingual Q5 min PRN Dhungel, Nishant, MD      . ondansetron (ZOFRAN) tablet 4 mg  4 mg Oral Q6H PRN Dhungel, Nishant, MD       Or  . ondansetron (ZOFRAN) injection 4 mg  4 mg Intravenous Q6H PRN Dhungel, Nishant, MD      . pantoprazole (PROTONIX) EC tablet 40 mg  40 mg Oral Daily Dhungel, Nishant, MD   40 mg at 03/03/17 0944  . polyethylene glycol (MIRALAX / GLYCOLAX) packet 17 g  17 g Oral Daily Abrol, Ascencion Dike, MD      . sulfamethoxazole-trimethoprim (BACTRIM DS,SEPTRA DS) 800-160 MG per tablet 1 tablet  1 tablet Oral Q12H Reyne Dumas, MD   1 tablet at 03/03/17 0944  . tiotropium (SPIRIVA) inhalation capsule  18 mcg  1 capsule Inhalation Daily Dhungel, Nishant, MD   18 mcg at 03/03/17 0805  . traZODone (DESYREL) tablet 100 mg  100 mg Oral QHS Dhungel, Nishant, MD   100 mg at 03/02/17 2257  . varenicline (CHANTIX) tablet 1 mg  1 mg Oral BID Dhungel, Nishant, MD   1 mg at 03/03/17 0944  . zolpidem (AMBIEN) tablet 5 mg  5 mg Oral QHS PRN Dhungel, Nishant, MD  Discharge Medications: Please see discharge summary for a list of discharge medications.  Relevant Imaging Results:  Relevant Lab Results:   Additional Information    Shalita Notte, Clydene Pugh, LCSW

## 2017-03-03 NOTE — Care Management Important Message (Signed)
Important Message  Patient Details  Name: Dale Shields MRN: [id number] Date of Birth: 1950-06-29   Medicare Important Message Given:  Yes    Sherald Barge, RN 03/03/2017, 2:36 PM

## 2017-03-03 NOTE — Progress Notes (Signed)
Patient is to be discharged home and in stable condition. Patient's IV removed, WNL. Report called to Fraser Din, facility nurse. Patient awaiting transportation.   Celestia Khat, RN

## 2017-03-03 NOTE — Discharge Summary (Signed)
Physician Discharge Summary  Dale Shields IEP:[id number] DOB: 1950/08/09 DOA: 02/28/2017  PCP: Monico Blitz, MD  Admit date: 02/28/2017 Discharge date: 03/03/2017  Time spent: >35 minutes  Recommendations for Outpatient Follow-up:  MD at the facility. Please f/u BMP in 2-4 days  -cont regular foley exchanges   Discharge Diagnoses:  Principal Problem:   Catheter-associated urinary tract infection ([place]) Active Problems:   Chronic pain syndrome   Multiple sclerosis (HCC)   COPD  GOLD IV   AKI (acute kidney injury) ([place])   Anemia of chronic disease   UTI (urinary tract infection)   Discharge Condition: stable   Diet recommendation: low sodium   Filed Weights   02/28/17 0458 02/28/17 1011  Weight: 104.3 kg (230 lb) 107 kg (235 lb 14.3 oz)    History of present illness:   66 y.o.male,with history of coronary artery disease with NST EMIs/pstents, a flutter status post cardioversion in July 2018 , on anticoagulation,anxiety, depression, COPD with chronic respiratory failure on 2 L oxygen, multiple sclerosis with chronic indwelling Foley(changed every month at the facility)with recurrent UTIs, OSA intolerant to CPAP, hypertension, hyperlipidemia and chronic pain on methadone who was sent from skilled nursing facility with acute onset of right lower quadrant abdominal pain . Patient now admitted for urinary tract infection. Patient has had multiple episodes of E. coli and Proteus UTI which were pansensitive but his last UTI earlier this year grew Pseudomonas and Enterobacter which were sensitive to quinolones and imipenem only. CT of the abdomen and pelvis done showed no signs of hydronephrosis, pyelonephritis or renal stones.    Hospital Course:   Catheter-associated urinary tract infection ([place]). Possible chronic colonization. CT abdomen is negative for pyelonephritis. Patient received Primaxin in the ED based on prior cultures. Then treated with iv meropenem  . Urine  culture positive for enterobacter . Foley cath was exchanged. No s/s of sepsis or severe infection. remained afebrile. No significant leukocytosis. Based on sensitivity switch to bactrim per pharmacy,continue for another 7 days. Recommended to f/u renal function in 2-4 days while on bactrim    AKI (acute kidney injury) ([place]). Likely secondary to UTIvsbladder outlet obstruction per ED note (Foley was clogged with sediments and drained 700 cc after he was replaced). received iv hydration with normal saline.  A flutter( CHADS2vasc score - 4). Had DC cardioversion done in July 2018. Sees Dr. Einar Gip.Continue metoprolol, Cardizem and eliquis.Currently in sinus rhythm  Chronic pain syndrome on methadone. Recommended to cont f/u with pain clinic   Multiple sclerosis ([place]). Wheelchair-bound. Outpatient follow-up.  COPD GOLD IV with chronic hypoxic respiratory failure. Continue oxygen via nasal cannula, home inhalers and as needed nebs.  Anemia of chronic disease/iron deficiency anemia. Stable Hg  Anxiety and depression. Continue as needed Klonopin,trazodone and Cymbalta.  Coronary artery disease. Continue beta-blocker, statin and digoxin.  Avascular necrosis of femur. Seen on CT scan patient was not evident on prior CT. Patient is wheelchair-bound from his MS. Denies any hip pain. Recommend outpatient follow-up.      Procedures:  none (i.e. Studies not automatically included, echos, thoracentesis, etc; not x-rays)  Consultations:  none  Discharge Exam: Vitals:   03/03/17 0552 03/03/17 0808  BP: 121/62   Pulse: 63   Resp: 18   Temp: 98.6 F (37 C)   SpO2: 99% 99%    General: alert. No distress.  Cardiovascular: s1,s2 rrr Respiratory: no wheezing   Discharge Instructions  Discharge Instructions    Diet - low sodium heart healthy  Complete by:  As directed    Increase activity slowly   Complete by:  As directed      Allergies as of 03/03/2017       Reactions   Ultram [tramadol Hcl] Other (See Comments)   Seizures; "grand mal; twice"   Gabapentin Other (See Comments)   Made pt dilusional   Lyrica [pregabalin] Other (See Comments)   Leg swelling.      Medication List    STOP taking these medications   guaiFENesin-dextromethorphan 100-10 MG/5ML syrup Commonly known as:  ROBITUSSIN DM   loperamide 2 MG capsule Commonly known as:  IMODIUM     TAKE these medications   acetaminophen 650 MG CR tablet Commonly known as:  TYLENOL Take 650 mg by mouth every 4 (four) hours as needed for pain.   albuterol 108 (90 Base) MCG/ACT inhaler Commonly known as:  PROVENTIL HFA;VENTOLIN HFA Inhale 2 puffs into the lungs as needed for wheezing or shortness of breath.   Armodafinil 250 MG tablet Take 250 mg by mouth daily.   atorvastatin 10 MG tablet Commonly known as:  LIPITOR Take 10 mg by mouth daily.   budesonide-formoterol 160-4.5 MCG/ACT inhaler Commonly known as:  SYMBICORT Inhale 2 puffs into the lungs 2 (two) times daily.   CHANTIX 1 MG tablet Generic drug:  varenicline Take 1 mg by mouth 2 (two) times daily.   clonazePAM 0.5 MG tablet Commonly known as:  KLONOPIN Take 0.5 mg by mouth 2 (two) times daily as needed for anxiety.   digoxin 0.125 MG tablet Commonly known as:  LANOXIN Take 0.125 mg by mouth every other day.   diltiazem 180 MG 24 hr tablet Commonly known as:  CARDIZEM LA Take 180 mg by mouth daily.   DULoxetine 60 MG capsule Commonly known as:  CYMBALTA TAKE (1) CAPSULE BY MOUTH TWICE DAILY.   ELIQUIS 5 MG Tabs tablet Generic drug:  apixaban Take 5 mg by mouth 2 (two) times daily.   ergocalciferol 50000 units capsule Commonly known as:  VITAMIN D2 Take 50,000 Units by mouth once a week.   eszopiclone 1 MG Tabs tablet Commonly known as:  LUNESTA Take 1 mg by mouth at bedtime as needed for sleep. Take immediately before bedtime   ferrous sulfate 325 (65 FE) MG EC tablet Take 325 mg by mouth daily  with breakfast.   levalbuterol 1.25 MG/3ML nebulizer solution Commonly known as:  XOPENEX Take 1.25 mg by nebulization every 6 (six) hours as needed for wheezing.   methadone 10 MG tablet Commonly known as:  DOLOPHINE Take 20 mg by mouth every 12 (twelve) hours.   metoprolol tartrate 25 MG tablet Commonly known as:  LOPRESSOR Take 25 mg by mouth 2 (two) times daily.   nitroGLYCERIN 0.3 MG SL tablet Commonly known as:  NITROSTAT Place 0.4 mg under the tongue every 5 (five) minutes as needed for chest pain.   omeprazole 40 MG capsule Commonly known as:  PRILOSEC Take 40 mg by mouth 2 (two) times daily.   OXYGEN 2lpm 24/7   polyethylene glycol packet Commonly known as:  MIRALAX / GLYCOLAX Take 17 g by mouth daily. Start taking on:  03/04/2017   sulfamethoxazole-trimethoprim 800-160 MG tablet Commonly known as:  BACTRIM DS,SEPTRA DS Take 1 tablet by mouth every 12 (twelve) hours.   Tiotropium Bromide Monohydrate 2.5 MCG/ACT Aers Commonly known as:  SPIRIVA RESPIMAT Inhale 2 puffs into the lungs daily.   traZODone 50 MG tablet Commonly known as:  DESYREL Take  2 tablets by mouth at bedtime.      Allergies  Allergen Reactions  . Ultram [Tramadol Hcl] Other (See Comments)    Seizures; "grand mal; twice"  . Gabapentin Other (See Comments)    Made pt dilusional  . Lyrica [Pregabalin] Other (See Comments)    Leg swelling.      The results of significant diagnostics from this hospitalization (including imaging, microbiology, ancillary and laboratory) are listed below for reference.    Significant Diagnostic Studies: Ct Abdomen Pelvis Wo Contrast  Result Date: 02/28/2017 CLINICAL DATA:  Right lower quadrant/ flank and bladder pain. Pain at Foley catheter after mild trauma to this region. EXAM: CT ABDOMEN AND PELVIS WITHOUT CONTRAST TECHNIQUE: Multidetector CT imaging of the abdomen and pelvis was performed following the standard protocol without IV contrast.  COMPARISON:  Plain films of 08/18/2016. Most recent CT of 01/26/2016. FINDINGS: Lower chest: Right base scarring. Tiny hiatal hernia. Normal heart size. Right-sided pleural thickening. Hepatobiliary: Mild hepatic steatosis, without focal liver lesion. Normal gallbladder, without biliary ductal dilatation. Pancreas: Fatty atrophy involving the pancreatic body, head, and uncinate process. No duct dilatation. Spleen: Normal in size, without focal abnormality. Adrenals/Urinary Tract: Normal adrenal glands. Left greater than right renal cortical thinning. Bilateral extrarenal pelves. No renal calculi or hydronephrosis. No hydroureter or ureteric calculi. A Foley catheter is appropriately positioned, within the decompressed urinary bladder. Stomach/Bowel: Proximal gastric underdistention. Colonic stool burden suggests constipation. Normal terminal ileum. Posterior appendix, terminating at the right upper quadrant. Normal small bowel. Vascular/Lymphatic: Aortic and branch vessel atherosclerosis. No retroperitoneal or retrocrural adenopathy. Mildly prominent but not pathologically enlarged external iliac nodes are likely reactive. Reproductive: Normal prostate. Other: No significant free fluid. Musculoskeletal: Right greater than left avascular necrosis of the femoral heads. Borderline L5 vertebral body height loss is chronic and favored to be within normal variation. IMPRESSION: 1. No urinary tract calculi or hydronephrosis. No explanation for patient's symptoms. 2.  Aortic Atherosclerosis (ICD10-I70.0). 3.  Tiny hiatal hernia. 4. Hepatic steatosis. 5.  Possible constipation. 6. Bilateral femoral head avascular necrosis. Electronically Signed   By: Abigail Miyamoto M.D.   On: 02/28/2017 07:29    Microbiology: Recent Results (from the past 240 hour(s))  Urine Culture     Status: Abnormal   Collection Time: 02/28/17  5:24 AM  Result Value Ref Range Status   Specimen Description URINE, CLEAN CATCH  Final   Special  Requests NONE  Final   Culture >=100,000 COLONIES/mL ENTEROBACTER CLOACAE (A)  Final   Report Status 03/02/2017 FINAL  Final   Organism ID, Bacteria ENTEROBACTER CLOACAE (A)  Final      Susceptibility   Enterobacter cloacae - MIC*    CEFAZOLIN >=64 RESISTANT Resistant     CEFTRIAXONE >=64 RESISTANT Resistant     CIPROFLOXACIN >=4 RESISTANT Resistant     GENTAMICIN <=1 SENSITIVE Sensitive     IMIPENEM 0.5 SENSITIVE Sensitive     NITROFURANTOIN 64 INTERMEDIATE Intermediate     TRIMETH/SULFA <=20 SENSITIVE Sensitive     PIP/TAZO >=128 RESISTANT Resistant     * >=100,000 COLONIES/mL ENTEROBACTER CLOACAE  MRSA PCR Screening     Status: Abnormal   Collection Time: 02/28/17 12:16 PM  Result Value Ref Range Status   MRSA by PCR POSITIVE (A) NEGATIVE Final    Comment:        The GeneXpert MRSA Assay (FDA approved for NASAL specimens only), is one component of a comprehensive MRSA colonization surveillance program. It is not intended to diagnose MRSA infection  nor to guide or monitor treatment for MRSA infections. RESULT CALLED TO, READ BACK BY AND VERIFIED WITH: STONE,B ON 02/28/17 AT 1615 BY LIY,C      Labs: Basic Metabolic Panel: Recent Labs  Lab 02/28/17 0550 03/01/17 0356 03/02/17 0703 03/03/17 0710  NA 136 136 137 134*  K 4.0 4.1 4.0 3.9  CL 97* 99* 100* 98*  CO2 30 31 31 30   GLUCOSE 147* 103* 108* 110*  BUN 22* 19 13 11   CREATININE 1.41* 1.38* 1.12 1.09  CALCIUM 8.9 8.8* 8.5* 8.6*   Liver Function Tests: Recent Labs  Lab 02/28/17 0550 03/02/17 0703  AST 18 18  ALT 16* 14*  ALKPHOS 115 106  BILITOT 0.5 0.4  PROT 7.3 6.7  ALBUMIN 2.8* 2.5*   Recent Labs  Lab 02/28/17 0550  LIPASE 18   No results for input(s): AMMONIA in the last 168 hours. CBC: Recent Labs  Lab 02/28/17 0550 03/03/17 0710  WBC 13.3* 5.8  NEUTROABS 10.3*  --   HGB 9.3* 8.8*  HCT 30.3* 29.5*  MCV 99.0 100.0  PLT 316 277   Cardiac Enzymes: No results for input(s): CKTOTAL,  CKMB, CKMBINDEX, TROPONINI in the last 168 hours. BNP: BNP (last 3 results) No results for input(s): BNP in the last 8760 hours.  ProBNP (last 3 results) No results for input(s): PROBNP in the last 8760 hours.  CBG: No results for input(s): GLUCAP in the last 168 hours.     SignedKinnie Feil  [facility] 03/03/2017, 1:57 PM

## 2017-03-03 NOTE — Clinical Social Work Note (Signed)
Clinical Social Work Assessment  Patient Details  Name: Dale Shields MRN: [id number] Date of Birth: [birth year]  Date of referral:  03/03/17               Reason for consult:  Other (Comment Required)(From [facility] )                Permission sought to share information with:    Permission granted to share information::     Name::        Agency::     Relationship::     Contact Information:     Housing/Transportation Living arrangements for the past 2 months:  [place] of Information:  Facility Patient Interpreter Needed:  None Criminal Activity/Legal Involvement Pertinent to Current Situation/Hospitalization:  No - Comment as needed Significant Relationships:  Siblings Lives with:  Facility Resident Do you feel safe going back to the place where you live?  Yes Need for family participation in patient care:  No (Coment)  Care giving concerns:  None identified.    Social Worker assessment / plan:  Patient has ben a resient at [facility] since 07/15/16. He is long term. He uses an Clinical research associate. Patient has ADLs completed by staff.  He is able to feed himself.  Patient has an external cath.  He is continent of bowels.  His brother is supportive and he is on oxygen. Patient can return to the facility at discharge.   LCSW notified facility of discharge and sent clinicals. LCSW signing off.   Employment status:  Retired Forensic scientist:  [employer] [place], [place] PT Recommendations:  Not assessed at this time Information / Referral to community resources:     Patient/Family's Response to care:  Patient is agreeable to return to facility.   Patient/Family's Understanding of and Emotional Response to Diagnosis, Current Treatment, and Prognosis:  Patient understands his diagnosis, treatment and prognosis.   Emotional Assessment Appearance:  Appears stated age Attitude/Demeanor/Rapport:    Affect (typically observed):   Calm Orientation:  Oriented to Situation, Oriented to  Time, Oriented to Place, Oriented to Self Alcohol / Substance use:  Not Applicable Psych involvement (Current and /or in the community):  No (Comment)  Discharge Needs  Concerns to be addressed:  Other (Comment Required Readmission within the last 30 days:  No Current discharge risk:  None Barriers to Discharge:  No Barriers Identified   Ihor Gully, LCSW 03/03/2017, 3:44 PM

## 2017-03-06 ENCOUNTER — Ambulatory Visit: Payer: Self-pay | Admitting: Internal Medicine

## 2017-03-20 ENCOUNTER — Ambulatory Visit: Payer: Self-pay | Admitting: Internal Medicine

## 2017-03-21 ENCOUNTER — Ambulatory Visit (INDEPENDENT_AMBULATORY_CARE_PROVIDER_SITE_OTHER): Payer: Medicare Other | Admitting: Orthopaedic Surgery

## 2017-03-31 ENCOUNTER — Ambulatory Visit (INDEPENDENT_AMBULATORY_CARE_PROVIDER_SITE_OTHER): Payer: Medicare Other | Admitting: Orthopaedic Surgery

## 2017-07-23 ENCOUNTER — Encounter (HOSPITAL_COMMUNITY): Payer: Self-pay | Admitting: Emergency Medicine

## 2017-07-23 ENCOUNTER — Other Ambulatory Visit: Payer: Self-pay

## 2017-07-23 ENCOUNTER — Inpatient Hospital Stay (HOSPITAL_COMMUNITY)
Admission: EM | Admit: 2017-07-23 | Discharge: 2017-07-26 | DRG: 699 | Disposition: A | Discharge status: Home / Self Care | Payer: Medicare Other | Attending: Internal Medicine | Admitting: Internal Medicine

## 2017-07-23 ENCOUNTER — Emergency Department (HOSPITAL_COMMUNITY): Payer: Medicare Other

## 2017-07-23 DIAGNOSIS — Z6839 Body mass index (BMI) 39.0-39.9, adult: Secondary | ICD-10-CM

## 2017-07-23 DIAGNOSIS — G4733 Obstructive sleep apnea (adult) (pediatric): Secondary | ICD-10-CM | POA: Diagnosis present

## 2017-07-23 DIAGNOSIS — Z885 Allergy status to narcotic agent status: Secondary | ICD-10-CM

## 2017-07-23 DIAGNOSIS — G47419 Narcolepsy without cataplexy: Secondary | ICD-10-CM | POA: Diagnosis present

## 2017-07-23 DIAGNOSIS — I2510 Atherosclerotic heart disease of native coronary artery without angina pectoris: Secondary | ICD-10-CM | POA: Diagnosis present

## 2017-07-23 DIAGNOSIS — E039 Hypothyroidism, unspecified: Secondary | ICD-10-CM | POA: Diagnosis present

## 2017-07-23 DIAGNOSIS — N319 Neuromuscular dysfunction of bladder, unspecified: Secondary | ICD-10-CM | POA: Diagnosis present

## 2017-07-23 DIAGNOSIS — I4891 Unspecified atrial fibrillation: Secondary | ICD-10-CM | POA: Diagnosis present

## 2017-07-23 DIAGNOSIS — K5900 Constipation, unspecified: Secondary | ICD-10-CM | POA: Diagnosis present

## 2017-07-23 DIAGNOSIS — J449 Chronic obstructive pulmonary disease, unspecified: Secondary | ICD-10-CM | POA: Diagnosis present

## 2017-07-23 DIAGNOSIS — F329 Major depressive disorder, single episode, unspecified: Secondary | ICD-10-CM | POA: Diagnosis present

## 2017-07-23 DIAGNOSIS — N179 Acute kidney failure, unspecified: Secondary | ICD-10-CM | POA: Diagnosis not present

## 2017-07-23 DIAGNOSIS — Z7901 Long term (current) use of anticoagulants: Secondary | ICD-10-CM | POA: Diagnosis not present

## 2017-07-23 DIAGNOSIS — F419 Anxiety disorder, unspecified: Secondary | ICD-10-CM | POA: Diagnosis present

## 2017-07-23 DIAGNOSIS — M419 Scoliosis, unspecified: Secondary | ICD-10-CM | POA: Diagnosis present

## 2017-07-23 DIAGNOSIS — Z888 Allergy status to other drugs, medicaments and biological substances status: Secondary | ICD-10-CM

## 2017-07-23 DIAGNOSIS — J9610 Chronic respiratory failure, unspecified whether with hypoxia or hypercapnia: Secondary | ICD-10-CM | POA: Diagnosis present

## 2017-07-23 DIAGNOSIS — Z23 Encounter for immunization: Secondary | ICD-10-CM

## 2017-07-23 DIAGNOSIS — T83518A Infection and inflammatory reaction due to other urinary catheter, initial encounter: Principal | ICD-10-CM | POA: Diagnosis present

## 2017-07-23 DIAGNOSIS — Z936 Other artificial openings of urinary tract status: Secondary | ICD-10-CM

## 2017-07-23 DIAGNOSIS — Z955 Presence of coronary angioplasty implant and graft: Secondary | ICD-10-CM | POA: Diagnosis not present

## 2017-07-23 DIAGNOSIS — I10 Essential (primary) hypertension: Secondary | ICD-10-CM | POA: Diagnosis present

## 2017-07-23 DIAGNOSIS — E860 Dehydration: Secondary | ICD-10-CM | POA: Diagnosis present

## 2017-07-23 DIAGNOSIS — G35 Multiple sclerosis: Secondary | ICD-10-CM | POA: Diagnosis present

## 2017-07-23 DIAGNOSIS — N390 Urinary tract infection, site not specified: Secondary | ICD-10-CM | POA: Diagnosis present

## 2017-07-23 DIAGNOSIS — Z9981 Dependence on supplemental oxygen: Secondary | ICD-10-CM

## 2017-07-23 DIAGNOSIS — T83511A Infection and inflammatory reaction due to indwelling urethral catheter, initial encounter: Secondary | ICD-10-CM

## 2017-07-23 DIAGNOSIS — K219 Gastro-esophageal reflux disease without esophagitis: Secondary | ICD-10-CM | POA: Diagnosis present

## 2017-07-23 DIAGNOSIS — D638 Anemia in other chronic diseases classified elsewhere: Secondary | ICD-10-CM | POA: Diagnosis present

## 2017-07-23 DIAGNOSIS — F1190 Opioid use, unspecified, uncomplicated: Secondary | ICD-10-CM | POA: Diagnosis present

## 2017-07-23 DIAGNOSIS — Z978 Presence of other specified devices: Secondary | ICD-10-CM

## 2017-07-23 DIAGNOSIS — G894 Chronic pain syndrome: Secondary | ICD-10-CM | POA: Diagnosis present

## 2017-07-23 DIAGNOSIS — T83510A Infection and inflammatory reaction due to cystostomy catheter, initial encounter: Secondary | ICD-10-CM | POA: Diagnosis not present

## 2017-07-23 DIAGNOSIS — I4892 Unspecified atrial flutter: Secondary | ICD-10-CM | POA: Diagnosis present

## 2017-07-23 DIAGNOSIS — Z960 Presence of urogenital implants: Secondary | ICD-10-CM | POA: Diagnosis not present

## 2017-07-23 DIAGNOSIS — Z87891 Personal history of nicotine dependence: Secondary | ICD-10-CM

## 2017-07-23 DIAGNOSIS — R369 Urethral discharge, unspecified: Secondary | ICD-10-CM | POA: Diagnosis present

## 2017-07-23 DIAGNOSIS — M159 Polyosteoarthritis, unspecified: Secondary | ICD-10-CM | POA: Diagnosis present

## 2017-07-23 DIAGNOSIS — I252 Old myocardial infarction: Secondary | ICD-10-CM

## 2017-07-23 DIAGNOSIS — E785 Hyperlipidemia, unspecified: Secondary | ICD-10-CM | POA: Diagnosis present

## 2017-07-23 DIAGNOSIS — M797 Fibromyalgia: Secondary | ICD-10-CM | POA: Diagnosis present

## 2017-07-23 DIAGNOSIS — Z7951 Long term (current) use of inhaled steroids: Secondary | ICD-10-CM

## 2017-07-23 LAB — COMPREHENSIVE METABOLIC PANEL
ALBUMIN: 3.2 g/dL — AB (ref 3.5–5.0)
ALK PHOS: 103 U/L (ref 38–126)
ALT: 20 U/L (ref 17–63)
AST: 25 U/L (ref 15–41)
Anion gap: 12 (ref 5–15)
BILIRUBIN TOTAL: 0.5 mg/dL (ref 0.3–1.2)
BUN: 33 mg/dL — ABNORMAL HIGH (ref 6–20)
CALCIUM: 9.1 mg/dL (ref 8.9–10.3)
CO2: 34 mmol/L — AB (ref 22–32)
Chloride: 95 mmol/L — ABNORMAL LOW (ref 101–111)
Creatinine, Ser: 1.4 mg/dL — ABNORMAL HIGH (ref 0.61–1.24)
GFR calc Af Amer: 59 mL/min — ABNORMAL LOW (ref >60)
GFR calc non Af Amer: 51 mL/min — ABNORMAL LOW (ref >60)
GLUCOSE: 108 mg/dL — AB (ref 65–99)
Potassium: 4.2 mmol/L (ref 3.5–5.1)
SODIUM: 141 mmol/L (ref 135–145)
TOTAL PROTEIN: 7.3 g/dL (ref 6.5–8.1)

## 2017-07-23 LAB — CBC WITH DIFFERENTIAL/PLATELET
BASOS ABS: 0 10*3/uL (ref 0.0–0.1)
BASOS PCT: 0 %
Eosinophils Absolute: 0.5 10*3/uL (ref 0.0–0.7)
Eosinophils Relative: 4 %
HEMATOCRIT: 32.9 % — AB (ref 39.0–52.0)
HEMOGLOBIN: 9.6 g/dL — AB (ref 13.0–17.0)
Lymphocytes Relative: 18 %
Lymphs Abs: 2.3 10*3/uL (ref 0.7–4.0)
MCH: 28.4 pg (ref 26.0–34.0)
MCHC: 29.2 g/dL — ABNORMAL LOW (ref 30.0–36.0)
MCV: 97.3 fL (ref 78.0–100.0)
MONOS PCT: 9 %
Monocytes Absolute: 1.1 10*3/uL (ref 0.1–1.0)
NEUTROS ABS: 9 10*3/uL (ref 1.7–7.7)
NEUTROS PCT: 69 %
Platelets: 252 10*3/uL (ref 150–400)
RBC: 3.38 MIL/uL — ABNORMAL LOW (ref 4.22–5.81)
RDW: 15.9 % — ABNORMAL HIGH (ref 11.5–15.5)
WBC: 12.9 10*3/uL — AB (ref 4.0–10.5)

## 2017-07-23 LAB — URINALYSIS, ROUTINE W REFLEX MICROSCOPIC
Bilirubin Urine: NEGATIVE
Glucose, UA: NEGATIVE mg/dL
KETONES UR: NEGATIVE mg/dL
Nitrite: POSITIVE — AB
PROTEIN: NEGATIVE mg/dL
Specific Gravity, Urine: 1.008 (ref 1.005–1.030)
pH: 7 (ref 5.0–8.0)

## 2017-07-23 LAB — LIPASE, BLOOD: Lipase: 28 U/L (ref 11–51)

## 2017-07-23 MED ORDER — VANCOMYCIN HCL IN DEXTROSE 1-5 GM/200ML-% IV SOLN
1000.0000 mg | Freq: Once | INTRAVENOUS | Status: DC
  Administered 2017-07-23: 1000 mg via INTRAVENOUS
  Filled 2017-07-23: qty 200

## 2017-07-23 MED ORDER — METHADONE HCL 10 MG PO TABS
20.0000 mg | ORAL_TABLET | Freq: Two times a day (BID) | ORAL | Status: DC
  Administered 2017-07-23 – 2017-07-26 (×7): 20 mg via ORAL
  Filled 2017-07-23 (×7): qty 2

## 2017-07-23 MED ORDER — ACETAMINOPHEN 325 MG PO TABS
650.0000 mg | ORAL_TABLET | Freq: Four times a day (QID) | ORAL | Status: DC | PRN
  Administered 2017-07-24: 650 mg via ORAL
  Filled 2017-07-23: qty 2

## 2017-07-23 MED ORDER — ARMODAFINIL 150 MG PO TABS: 150.0000 mg | ORAL_TABLET | Freq: Every day | ORAL | Status: DC

## 2017-07-23 MED ORDER — MORPHINE SULFATE (PF) 4 MG/ML IV SOLN
4.0000 mg | Freq: Once | INTRAVENOUS | Status: AC
  Administered 2017-07-23: 4 mg via INTRAVENOUS
  Filled 2017-07-23: qty 1

## 2017-07-23 MED ORDER — MOMETASONE FURO-FORMOTEROL FUM 200-5 MCG/ACT IN AERO
2.0000 | INHALATION_SPRAY | Freq: Two times a day (BID) | RESPIRATORY_TRACT | Status: DC
Start: 2017-07-23 — End: 2017-07-27
  Administered 2017-07-24 – 2017-07-26 (×6): 2 via RESPIRATORY_TRACT
  Filled 2017-07-23 (×2): qty 8.8

## 2017-07-23 MED ORDER — PANTOPRAZOLE SODIUM 40 MG PO TBEC
40.0000 mg | DELAYED_RELEASE_TABLET | Freq: Every day | ORAL | Status: DC
  Administered 2017-07-24 – 2017-07-26 (×3): 40 mg via ORAL
  Filled 2017-07-23 (×3): qty 1

## 2017-07-23 MED ORDER — DULOXETINE HCL 30 MG PO CPEP
60.0000 mg | ORAL_CAPSULE | Freq: Two times a day (BID) | ORAL | Status: DC
  Administered 2017-07-23 – 2017-07-26 (×6): 60 mg via ORAL
  Filled 2017-07-23 (×6): qty 2

## 2017-07-23 MED ORDER — CLONAZEPAM 0.5 MG PO TABS
0.2500 mg | ORAL_TABLET | Freq: Two times a day (BID) | ORAL | Status: DC
  Administered 2017-07-23 – 2017-07-26 (×6): 0.25 mg via ORAL
  Filled 2017-07-23 (×7): qty 1

## 2017-07-23 MED ORDER — TRAZODONE HCL 50 MG PO TABS
100.0000 mg | ORAL_TABLET | Freq: Every day | ORAL | Status: DC
  Administered 2017-07-23 – 2017-07-25 (×3): 100 mg via ORAL
  Filled 2017-07-23 (×4): qty 2

## 2017-07-23 MED ORDER — DILTIAZEM HCL ER COATED BEADS 180 MG PO CP24
180.0000 mg | ORAL_CAPSULE | Freq: Every day | ORAL | Status: DC
  Administered 2017-07-24 – 2017-07-26 (×3): 180 mg via ORAL
  Filled 2017-07-23 (×8): qty 1

## 2017-07-23 MED ORDER — PNEUMOCOCCAL VAC POLYVALENT 25 MCG/0.5ML IJ INJ
0.5000 mL | INJECTION | INTRAMUSCULAR | Status: AC
  Administered 2017-07-24: 0.5 mL via INTRAMUSCULAR
  Filled 2017-07-23: qty 0.5

## 2017-07-23 MED ORDER — ONDANSETRON HCL 4 MG/2ML IJ SOLN: 4.0000 mg | Freq: Four times a day (QID) | INTRAMUSCULAR | Status: DC | PRN

## 2017-07-23 MED ORDER — VANCOMYCIN HCL IN DEXTROSE 1-5 GM/200ML-% IV SOLN
INTRAVENOUS | Status: AC
  Filled 2017-07-23: qty 200

## 2017-07-23 MED ORDER — ACETAMINOPHEN 650 MG RE SUPP: 650.0000 mg | Freq: Four times a day (QID) | RECTAL | Status: DC | PRN

## 2017-07-23 MED ORDER — VANCOMYCIN HCL 10 G IV SOLR
2000.0000 mg | Freq: Once | INTRAVENOUS | Status: DC
  Filled 2017-07-23: qty 2000

## 2017-07-23 MED ORDER — IOPAMIDOL (ISOVUE-300) INJECTION 61%
30.0000 mL | Freq: Once | INTRAVENOUS | Status: AC | PRN
  Administered 2017-07-23: 30 mL via ORAL

## 2017-07-23 MED ORDER — SODIUM CHLORIDE 0.9 % IV SOLN
500.0000 mg | Freq: Once | INTRAVENOUS | Status: AC
  Administered 2017-07-23: 500 mg via INTRAVENOUS
  Filled 2017-07-23: qty 500

## 2017-07-23 MED ORDER — ATORVASTATIN CALCIUM 10 MG PO TABS
10.0000 mg | ORAL_TABLET | Freq: Every evening | ORAL | Status: DC
  Administered 2017-07-23 – 2017-07-26 (×4): 10 mg via ORAL
  Filled 2017-07-23 (×4): qty 1

## 2017-07-23 MED ORDER — SODIUM CHLORIDE 0.9 % IV SOLN
INTRAVENOUS | Status: DC
  Administered 2017-07-23 – 2017-07-26 (×7): via INTRAVENOUS

## 2017-07-23 MED ORDER — VANCOMYCIN HCL IN DEXTROSE 750-5 MG/150ML-% IV SOLN
750.0000 mg | Freq: Two times a day (BID) | INTRAVENOUS | Status: DC
Start: 2017-07-24 — End: 2017-07-26
  Administered 2017-07-24 – 2017-07-25 (×4): 750 mg via INTRAVENOUS
  Filled 2017-07-23 (×7): qty 150

## 2017-07-23 MED ORDER — SODIUM CHLORIDE 0.9 % IV SOLN
500.0000 mg | Freq: Four times a day (QID) | INTRAVENOUS | Status: DC
  Administered 2017-07-24 – 2017-07-26 (×9): 500 mg via INTRAVENOUS
  Filled 2017-07-23 (×15): qty 500

## 2017-07-23 MED ORDER — SODIUM CHLORIDE 0.9 % IV BOLUS
500.0000 mL | Freq: Once | INTRAVENOUS | Status: AC
  Administered 2017-07-23: 500 mL via INTRAVENOUS

## 2017-07-23 MED ORDER — UMECLIDINIUM BROMIDE 62.5 MCG/INH IN AEPB
1.0000 | INHALATION_SPRAY | Freq: Every day | RESPIRATORY_TRACT | Status: DC
  Administered 2017-07-24 – 2017-07-26 (×3): 1 via RESPIRATORY_TRACT
  Filled 2017-07-23: qty 7

## 2017-07-23 MED ORDER — APIXABAN 5 MG PO TABS
5.0000 mg | ORAL_TABLET | Freq: Two times a day (BID) | ORAL | Status: DC
  Administered 2017-07-23 – 2017-07-26 (×6): 5 mg via ORAL
  Filled 2017-07-23 (×6): qty 1

## 2017-07-23 MED ORDER — VANCOMYCIN HCL IN DEXTROSE 1-5 GM/200ML-% IV SOLN
1000.0000 mg | Freq: Once | INTRAVENOUS | Status: AC
  Administered 2017-07-23: 1000 mg via INTRAVENOUS
  Filled 2017-07-23: qty 200

## 2017-07-23 MED ORDER — ORAL CARE MOUTH RINSE
15.0000 mL | Freq: Two times a day (BID) | OROMUCOSAL | Status: DC
  Administered 2017-07-23 – 2017-07-25 (×3): 15 mL via OROMUCOSAL

## 2017-07-23 MED ORDER — ONDANSETRON HCL 4 MG PO TABS: 4.0000 mg | ORAL_TABLET | Freq: Four times a day (QID) | ORAL | Status: DC | PRN

## 2017-07-23 MED ORDER — METOPROLOL TARTRATE 25 MG PO TABS
25.0000 mg | ORAL_TABLET | Freq: Two times a day (BID) | ORAL | Status: DC
  Administered 2017-07-23 – 2017-07-26 (×6): 25 mg via ORAL
  Filled 2017-07-23 (×6): qty 1

## 2017-07-23 NOTE — ED Provider Notes (Signed)
[facility] Provider Note   CSN: [id number] Arrival date & time: 07/23/17  1513     History   Chief Complaint Chief Complaint  Patient presents with  . Recurrent UTI  . Penile Discharge    HPI DAEVON HOLDREN is a 67 y.o. male.  HPI Patient with history of indwelling Foley catheter due to neurogenic bladder and recurrent UTIs.  States he has been having dysuria and penile discharge for the past 3 weeks.  Completed course of antibiotics with no improvement.  Also complaining of lower abdominal discomfort.  Admits to constipation.  No fever or chills.  Also complains of right knee pain starting yesterday.  No trauma.  No new rashes.  Does not remember the name of the antibiotic he recently completed. Past Medical History:  Diagnosis Date  . Anemia of chronic disease   . Anxiety   . Anxiety and depression   . BPH (benign prostatic hypertrophy)   . CAD (coronary artery disease) 11/2011   Stents x 3  . Carpal tunnel syndrome   . COPD (chronic obstructive pulmonary disease) ([place])   . DDD (degenerative disc disease)    knees, hips, shoulders--Dr. Ronnie [facility] prior to this)  . Depression   . Dyspnea   . Endocarditis 2012  . Exertional dyspnea   . Fibromyalgia   . GERD (gastroesophageal reflux disease)   . H/O hiatal hernia   . Heart murmur   . History of blood transfusion    "gave me too much coumadin and heparin once"  . History of epistaxis   . History of gout   . History of MRSA infection 2012   UTI, bacteremia, and endocarditis  . History of renal failure 2011   "on dialysis 3 times over 2 years"  . HLD (hyperlipidemia)   . Hydronephrosis   . Hypertension   . Hypothyroidism   . Insomnia   . Multiple sclerosis ([place])    with optic neuritis  . Myocardial infarction [facility])    "according to stress test; that's news to me"  . Narcolepsy   . Necrotizing fasciitis ([place])   . Neurogenic bladder    chronic indwelling foley catheter-changed monthly    . Optic neuritis   . OSA (obstructive sleep apnea)    "tried CPAP; lost more sleep w/use"  . Osteoarthritis of multiple joints    "everywhere"  . Pulmonary embolism ([place])    "suspected; never confirmed"--? treated with coumadin  . Scoliosis   . Steroid-induced psychosis   . Supraspinatus tendon tear 2012  . Thrombocytopenia [facility])     Patient Active Problem List   Diagnosis Date Noted  . Neurogenic bladder 07/23/2017  . Chronic indwelling Foley catheter 07/23/2017  . Chronic respiratory failure with hypoxia and hypercapnia ([place]) 12/02/2016  . Morbid obesity due to excess calories ([place]) 12/02/2016  . Atrial flutter ([place]) 11/07/2016  . SOB (shortness of breath) 03/01/2016  . Anemia 03/01/2016  . Major depressive disorder, recurrent episode, moderate ([place]) 01/15/2016  . Depression 01/11/2016  . Hyperlipidemia 01/11/2016  . Anxiety 01/11/2016  . Chest pain 04/06/2015  . Hydronephrosis 11/18/2014  . Catheter-associated urinary tract infection ([place])   . Malnutrition of moderate degree ([place]) 11/17/2014  . AKI (acute kidney injury) ([place]) 11/16/2014  . Hematuria 11/16/2014  . Generalized weakness 11/16/2014  . FTT (failure to thrive) in adult 11/16/2014  . Anemia of chronic disease 11/16/2014  . COPD  GOLD IV 11/15/2014  . CAD (coronary artery disease) 11/15/2014  .  Chronic pain syndrome 04/11/2013  . Multiple sclerosis ([place]) 04/11/2013    Past Surgical History:  Procedure Laterality Date  . CARDIOVERSION N/A 11/08/2016   Procedure: CARDIOVERSION;  Surgeon: Adrian Prows, MD;  Location: [place];  Service: Cardiovascular;  Laterality: N/A;  . CARPAL TUNNEL RELEASE  ~ 2010   left with radius fracture repair  . COLONOSCOPY  2000; 2005; 03/15/2011   diminutve adenoma--recall 2017  . CORONARY ANGIOPLASTY  11/22/2011   3 vessels  . CORONARY ANGIOPLASTY WITH STENT PLACEMENT  11/22/2011   DES to LAD  . ESOPHAGOGASTRODUODENOSCOPY N/A 03/04/2016   Procedure:  ESOPHAGOGASTRODUODENOSCOPY (EGD);  Surgeon: Manus Gunning, MD;  Location: [place];  Service: Gastroenterology;  Laterality: N/A;  . KNEE ARTHROSCOPY W/ MENISCAL REPAIR  1990's   right  . LEFT HEART CATHETERIZATION WITH CORONARY ANGIOGRAM N/A 11/22/2011   Procedure: LEFT HEART CATHETERIZATION WITH CORONARY ANGIOGRAM;  Surgeon: Laverda Page, MD;  Location: [facility] CATH LAB;  Service: Cardiovascular;  Laterality: N/A;  . nasal cautery    . PERCUTANEOUS CORONARY STENT INTERVENTION (PCI-S)  11/22/2011   Procedure: PERCUTANEOUS CORONARY STENT INTERVENTION (PCI-S);  Surgeon: Laverda Page, MD;  Location: [facility] CATH LAB;  Service: Cardiovascular;;  . SKIN GRAFT  04/2009   right thigh to right "wrist all the way to my shoulder; necrotizing fasciitis        Home Medications    Prior to Admission medications   Medication Sig Start Date End Date Taking? Authorizing Provider  apixaban (ELIQUIS) 5 MG TABS tablet Take 5 mg by mouth 2 (two) times daily.   Yes [provider]  Armodafinil 150 MG tablet Take 150 mg by mouth daily.    Yes [provider]  atorvastatin (LIPITOR) 10 MG tablet Take 10 mg by mouth every evening.    Yes [provider]  budesonide-formoterol (SYMBICORT) 160-4.5 MCG/ACT inhaler Inhale 2 puffs into the lungs 2 (two) times daily. 02/06/17  Yes Tanda Rockers, MD  clonazePAM (KLONOPIN) 0.5 MG tablet Take 0.25 mg by mouth 2 (two) times daily.    Yes [provider]  diltiazem (CARDIZEM LA) 180 MG 24 hr tablet Take 180 mg by mouth daily.    Yes [provider]  DULoxetine (CYMBALTA) 60 MG capsule TAKE (1) CAPSULE BY MOUTH TWICE DAILY. 02/18/16  Yes Norman Clay, MD  ergocalciferol (VITAMIN D2) 50000 units capsule Take 50,000 Units by mouth every Monday.    Yes [provider]  ferrous sulfate 325 (65 FE) MG EC tablet Take 325 mg by mouth daily with breakfast.   Yes [provider]  methadone (DOLOPHINE) 10 MG  tablet Take 20 mg by mouth every 12 (twelve) hours.    Yes [provider]  metoprolol tartrate (LOPRESSOR) 25 MG tablet Take 25 mg by mouth 2 (two) times daily.    Yes [provider]  omeprazole (PRILOSEC) 40 MG capsule Take 40 mg by mouth 2 (two) times daily.   Yes [provider]  traZODone (DESYREL) 100 MG tablet Take 100 mg by mouth at bedtime.  02/15/16  Yes [provider]  umeclidinium bromide (INCRUSE ELLIPTA) 62.5 MCG/INH AEPB Inhale 1 puff into the lungs daily.   Yes [provider]  nitroGLYCERIN (NITROSTAT) 0.3 MG SL tablet Place 0.4 mg under the tongue every 5 (five) minutes as needed for chest pain.    [provider]  OXYGEN Inhale 2 L into the lungs continuous. 2lpm 24/7     [provider]  Family History Family History  Problem Relation Age of Onset  . Alzheimer's disease Mother   . Colon cancer Neg Hx     Social History Social History   Tobacco Use  . Smoking status: Former Smoker    Packs/day: 1.00    Years: 40.00    Pack years: 40.00    Types: Cigarettes    Last attempt to quit: 11/27/2016    Years since quitting: 0.6  . Smokeless tobacco: Never Used  Substance Use Topics  . Alcohol use: No    Comment: 11/22/2011 "like crazy til 1988; nothing since"  . Drug use: No    Types: Cocaine    Comment: *8//13/13 "1986 til 1988 like crazy; nothing since"     Allergies   Ultram [tramadol hcl]; Gabapentin; and Lyrica [pregabalin]   Review of Systems Review of Systems  Constitutional: Negative for chills and fever.  Respiratory: Negative for cough and shortness of breath.   Cardiovascular: Negative for chest pain.  Gastrointestinal: Positive for abdominal distention, abdominal pain and constipation. Negative for nausea and vomiting.  Genitourinary: Positive for discharge, dysuria and penile pain. Negative for frequency and hematuria.  Musculoskeletal: Positive for arthralgias. Negative for back  pain and neck pain.  Skin: Negative for rash and wound.  Neurological: Negative for dizziness, weakness, light-headedness, numbness and headaches.  All other systems reviewed and are negative.    Physical Exam Updated Vital Signs BP (!) 123/52 (BP Location: Left Arm)   Pulse 73   Temp 98.6 F (37 C) (Oral)   Resp 16   Ht 5\' 8"  (1.727 m)   Wt 116.4 kg (256 lb 9.9 oz)   SpO2 94%   BMI 39.02 kg/m   Physical Exam  Constitutional: He is oriented to person, place, and time. He appears well-developed and well-nourished. No distress.  Chronically ill-appearing  HENT:  Head: Normocephalic and atraumatic.  Mouth/Throat: Oropharynx is clear and moist. No oropharyngeal exudate.  Eyes: Pupils are equal, round, and reactive to light. EOM are normal.  Neck: Normal range of motion. Neck supple.  Cardiovascular: Normal rate and regular rhythm. Exam reveals no gallop and no friction rub.  No murmur heard. Pulmonary/Chest: Effort normal and breath sounds normal. No stridor. No respiratory distress. He has no wheezes. He has no rales. He exhibits no tenderness.  Abdominal: Soft. Bowel sounds are normal. He exhibits distension. There is tenderness. There is no rebound and no guarding.  Distended abdomen.  Patient does have some tenderness to palpation in the right lower quadrant and epigastrium.  No rebound or guarding  Genitourinary:  Genitourinary Comments: Indwelling Foley catheter.  Patient has purulent discharge from the urethral meatus around the tip of the Foley catheter.  Musculoskeletal: Normal range of motion. He exhibits no edema or tenderness.  No knee effusion, deformity, erythema or warmth.  Full range of motion.  No lower extremity asymmetry or tenderness.  Neurological: He is alert and oriented to person, place, and time.  Wheelchair-bound with chronic lower extremity weakness.  5/5 bilateral upper extremities.  Skin: Skin is warm and dry. No rash noted. He is not diaphoretic. No  erythema.  Psychiatric: He has a normal mood and affect. His behavior is normal.  Nursing note and vitals reviewed.    ED Treatments / Results  Labs (all labs ordered are listed, but only abnormal results are displayed) Labs Reviewed  URINE CULTURE - Abnormal; Notable for the following components:      Result Value   Culture MULTIPLE SPECIES  PRESENT, SUGGEST RECOLLECTION (*)    All other components within normal limits  MRSA PCR SCREENING - Abnormal; Notable for the following components:   MRSA by PCR POSITIVE (*)    All other components within normal limits  CBC WITH DIFFERENTIAL/PLATELET - Abnormal; Notable for the following components:   WBC 12.9 (*)    RBC 3.38 (*)    Hemoglobin 9.6 (*)    HCT 32.9 (*)    MCHC 29.2 (*)    RDW 15.9 (*)    All other components within normal limits  COMPREHENSIVE METABOLIC PANEL - Abnormal; Notable for the following components:   Chloride 95 (*)    CO2 34 (*)    Glucose, Bld 108 (*)    BUN 33 (*)    Creatinine, Ser 1.40 (*)    Albumin 3.2 (*)    GFR calc non Af Amer 51 (*)    GFR calc Af Amer 59 (*)    All other components within normal limits  URINALYSIS, ROUTINE W REFLEX MICROSCOPIC - Abnormal; Notable for the following components:   APPearance CLOUDY (*)    Hgb urine dipstick LARGE (*)    Nitrite POSITIVE (*)    Leukocytes, UA LARGE (*)    Bacteria, UA FEW (*)    Squamous Epithelial / LPF 0-5 (*)    All other components within normal limits  CBC - Abnormal; Notable for the following components:   WBC 11.1 (*)    RBC 3.08 (*)    Hemoglobin 8.9 (*)    HCT 30.0 (*)    MCHC 29.7 (*)    RDW 15.9 (*)    All other components within normal limits  BASIC METABOLIC PANEL - Abnormal; Notable for the following components:   Chloride 98 (*)    CO2 33 (*)    Glucose, Bld 122 (*)    BUN 31 (*)    Creatinine, Ser 1.59 (*)    Calcium 8.1 (*)    GFR calc non Af Amer 44 (*)    GFR calc Af Amer 51 (*)    All other components within normal  limits  BASIC METABOLIC PANEL - Abnormal; Notable for the following components:   Chloride 97 (*)    CO2 33 (*)    Glucose, Bld 123 (*)    BUN 24 (*)    Calcium 8.3 (*)    All other components within normal limits  CBC - Abnormal; Notable for the following components:   RBC 3.22 (*)    Hemoglobin 9.2 (*)    HCT 31.7 (*)    MCHC 29.0 (*)    RDW 15.9 (*)    All other components within normal limits  CBC - Abnormal; Notable for the following components:   RBC 3.06 (*)    Hemoglobin 8.6 (*)    HCT 30.1 (*)    MCHC 28.6 (*)    RDW 15.9 (*)    All other components within normal limits  BASIC METABOLIC PANEL - Abnormal; Notable for the following components:   Chloride 98 (*)    Glucose, Bld 113 (*)    Calcium 8.3 (*)    All other components within normal limits  VANCOMYCIN, TROUGH - Abnormal; Notable for the following components:   Vancomycin Tr 21 (*)    All other components within normal limits  AEROBIC CULTURE (SUPERFICIAL SPECIMEN)  URINE CULTURE  LIPASE, BLOOD  GC/CHLAMYDIA PROBE AMP ([place]) NOT AT [facility]    EKG None  Radiology No  results found.  Procedures Procedures (including critical care time)  Medications Ordered in ED Medications  apixaban (ELIQUIS) tablet 5 mg (5 mg Oral Given 07/26/17 0849)  atorvastatin (LIPITOR) tablet 10 mg (10 mg Oral Given 07/25/17 1704)  mometasone-formoterol (DULERA) 200-5 MCG/ACT inhaler 2 puff (2 puffs Inhalation Given 07/26/17 0759)  clonazePAM (KLONOPIN) tablet 0.25 mg (0.25 mg Oral Given 07/25/17 2141)  diltiazem (CARDIZEM LA) 24 hr tablet 180 mg (180 mg Oral Given 07/26/17 0849)  DULoxetine (CYMBALTA) DR capsule 60 mg (60 mg Oral Given 07/26/17 0849)  methadone (DOLOPHINE) tablet 20 mg (20 mg Oral Given 07/26/17 0512)  metoprolol tartrate (LOPRESSOR) tablet 25 mg (25 mg Oral Given 07/26/17 0849)  pantoprazole (PROTONIX) EC tablet 40 mg (40 mg Oral Given 07/26/17 0850)  traZODone (DESYREL) tablet 100 mg (100 mg Oral Given 07/25/17  2130)  umeclidinium bromide (INCRUSE ELLIPTA) 62.5 MCG/INH 1 puff (1 puff Inhalation Given 07/26/17 0802)  acetaminophen (TYLENOL) tablet 650 mg (650 mg Oral Given 07/24/17 1554)    Or  acetaminophen (TYLENOL) suppository 650 mg ( Rectal See Alternative 07/24/17 1554)  0.9 %  sodium chloride infusion ( Intravenous New Bag/Given 07/25/17 2328)  ondansetron (ZOFRAN) tablet 4 mg (has no administration in time range)    Or  ondansetron (ZOFRAN) injection 4 mg (has no administration in time range)  vancomycin (VANCOCIN) IVPB 750 mg/150 ml premix (0 mg Intravenous Stopped 07/26/17 0025)  MEDLINE mouth rinse (15 mLs Mouth Rinse Not Given 07/26/17 0850)  imipenem-cilastatin (PRIMAXIN) 500 mg in sodium chloride 0.9 % 100 mL IVPB (500 mg Intravenous New Bag/Given 07/26/17 0514)  mupirocin ointment (BACTROBAN) 2 % 1 application (1 application Nasal Not Given 07/26/17 0851)  Chlorhexidine Gluconate Cloth 2 % PADS 6 each (6 each Topical Given 07/26/17 0513)  oxyCODONE (Oxy IR/ROXICODONE) immediate release tablet 10 mg (10 mg Oral Given 07/25/17 1031)  guaiFENesin-dextromethorphan (ROBITUSSIN DM) 100-10 MG/5ML syrup 5 mL (5 mLs Oral Given 07/25/17 1704)  morphine 4 MG/ML injection 4 mg (4 mg Intravenous Given 07/23/17 1846)  sodium chloride 0.9 % bolus 500 mL (0 mLs Intravenous Stopped 07/23/17 1930)  iopamidol (ISOVUE-300) 61 % injection 30 mL (30 mLs Oral Contrast Given 07/23/17 2007)  imipenem-cilastatin (PRIMAXIN) 500 mg in sodium chloride 0.9 % 100 mL IVPB (0 mg Intravenous Stopped 07/23/17 2144)  morphine 4 MG/ML injection 4 mg (4 mg Intravenous Given 07/23/17 2112)  vancomycin (VANCOCIN) IVPB 1000 mg/200 mL premix (0 mg Intravenous Stopped 07/24/17 0047)  pneumococcal 23 valent vaccine (PNU-IMMUNE) injection 0.5 mL (0.5 mLs Intramuscular Given 07/24/17 0907)  oxyCODONE (Oxy IR/ROXICODONE) immediate release tablet 10 mg (10 mg Oral Given 07/25/17 0321)  polyethylene glycol (MIRALAX / GLYCOLAX) packet 17 g (17 g Oral  Given 07/25/17 1317)     Initial Impression / Assessment and Plan / ED Course  I have reviewed the triage vital signs and the nursing notes.  Pertinent labs & imaging results that were available during my care of the patient were reviewed by me and considered in my medical decision making (see chart for details).     Patient with comp located UTI.  Received full course of antibiotics with no improvement of urinary symptoms.  Concern for drug resistant bacteria.  Reviewed previous urine cultures.  Will start on imipenem.  Discussed with hospitalist.  Will see patient in the emergency department.  Suggest adding vancomycin to cover for MRSA.  Final Clinical Impressions(s) / ED Diagnoses   Final diagnoses:  Complicated urinary tract infection    ED  Discharge Orders    None       Julianne Rice, MD 07/26/17 [phone]

## 2017-07-23 NOTE — Progress Notes (Signed)
Pharmacy Antibiotic Note  Dale Shields is a 67 y.o. male admitted on 07/23/2017 with UTI and history of UTI with pseduomonas, enterobacter, and MRSA.  Pharmacy has been consulted for Vanco and Primaxin dosing.  Plan: Vancomycin 2000 mg IV bolus dose Vancomycin 750 mg IV every 12 hours.  Goal trough 15-20 mcg/mL.  Primaxin 500 mg IV Q6 hours Monitor labs, c/s, and vanco trough as indicated  Height: 5\' 8"  (172.7 cm) Weight: 256 lb 9.9 oz (116.4 kg) IBW/kg (Calculated) : 68.4  Temp (24hrs), Avg:98.5 F (36.9 C), Min:98.3 F (36.8 C), Max:98.6 F (37 C)  Recent Labs  Lab 07/23/17 1726  WBC 12.9*  CREATININE 1.40*    Estimated Creatinine Clearance: 65.2 mL/min (A) (by C-G formula based on SCr of 1.4 mg/dL (H)).    Allergies  Allergen Reactions  . Ultram [Tramadol Hcl] Other (See Comments)    Seizures; "grand mal; twice"  . Gabapentin Other (See Comments)    Made pt dilusional  . Lyrica [Pregabalin] Other (See Comments)    Leg swelling.    Antimicrobials this admission: Vanco 4/14 >>  Primaxin 4/14 >>   Dose adjustments this admission: N/A  Microbiology results: 4/14 Wound: pending 4/14 UCx: pending  4/14 MRSA PCR: pending  Thank you for allowing pharmacy to be a part of this patient's care.  Ramond Craver 07/23/2017 11:43 PM

## 2017-07-23 NOTE — ED Triage Notes (Signed)
Per report pt chewing fent patches (pt angrily denies) States has been on fent and methadone  Since 1990 " and everything else in between" States has been treated for UTI and now he has a discharge that is getting worse  States he is having penile pain, lower abd pain, now with knee, and ankle pain  "it's like my whole belly region is inflamed"

## 2017-07-23 NOTE — H&P (Signed)
History and Physical  SINAI MAHANY BJY:[id number] DOB: Nov 19, 1950 DOA: 07/23/2017  Referring physician: Dr Lita Mains, ED physician PCP: Monico Blitz, MD  Outpatient Specialists:   Patient Coming From: [facility]  Chief Complaint: UTI and penile discharge  HPI: Dale Shields is a 67 y.o. male with a history of multiple sclerosis, neurogenic bladder with chronic indwelling catheter, recurrent UTI, COPD, CAD with stents x3, atrial fibrillation on anticoagulation, GERD, history of MRSA infection, history of Pseudomonas UTI.  The patient has had several episodes UTIs.  His last urine culture in our system was in December 2018 which showed Enterobacter.  In January 2018, the patient has had a urine culture with Enterobacter and Pseudomonas.  He was treated for UTI approximately 3 weeks ago at [facility].  His symptoms continued to wor[place].  Now the patient is complaining of tenderness in his pelvis with purulent penile discharge.  No radiation to the patient's pelvic pain.  No palliating or provoking factors.  Denies fevers, chills, nausea, vomiting.  Emergency Department Course: White count of 13,000.  Creatinine elevated 1.4.  CT shows thickened bladder consistent with UTI.  No pelvic abscess or renal abscess.  UA consistent with UTI.  Review of Systems:   Patient complains of bilateral knee pain and ankle pain.  Pt denies any fevers, chills, nausea, vomiting, diarrhea, constipation, abdominal pain, shortness of breath, dyspnea on exertion, orthopnea, cough, wheezing, palpitations, headache, vision changes, lightheadedness, dizziness, melena, rectal bleeding.  Review of systems are otherwise negative  Past Medical History:  Diagnosis Date  . Anemia of chronic disease   . Anxiety   . Anxiety and depression   . BPH (benign prostatic hypertrophy)   . CAD (coronary artery disease) 11/2011   Stents x 3  . Carpal tunnel syndrome   . COPD (chronic obstructive pulmonary disease) ([place])    . DDD (degenerative disc disease)    knees, hips, shoulders--Dr. Ronnie Derby [facility] prior to this)  . Depression   . Dyspnea   . Endocarditis 2012  . Exertional dyspnea   . Fibromyalgia   . GERD (gastroesophageal reflux disease)   . H/O hiatal hernia   . Heart murmur   . History of blood transfusion    "gave me too much coumadin and heparin once"  . History of epistaxis   . History of gout   . History of MRSA infection 2012   UTI, bacteremia, and endocarditis  . History of renal failure 2011   "on dialysis 3 times over 2 years"  . HLD (hyperlipidemia)   . Hydronephrosis   . Hypertension   . Hypothyroidism   . Insomnia   . Multiple sclerosis ([place])    with optic neuritis  . Myocardial infarction [facility])    "according to stress test; that's news to me"  . Narcolepsy   . Necrotizing fasciitis ([place])   . Neurogenic bladder    chronic indwelling foley catheter-changed monthly  . Optic neuritis   . OSA (obstructive sleep apnea)    "tried CPAP; lost more sleep w/use"  . Osteoarthritis of multiple joints    "everywhere"  . Pulmonary embolism ([place])    "suspected; never confirmed"--? treated with coumadin  . Scoliosis   . Steroid-induced psychosis   . Supraspinatus tendon tear 2012  . Thrombocytopenia ([place])    Past Surgical History:  Procedure Laterality Date  . CARDIOVERSION N/A 11/08/2016   Procedure: CARDIOVERSION;  Surgeon: Adrian Prows, MD;  Location: [place];  Service: Cardiovascular;  Laterality: N/A;  .  CARPAL TUNNEL RELEASE  ~ 2010   left with radius fracture repair  . COLONOSCOPY  2000; 2005; 03/15/2011   diminutve adenoma--recall 2017  . CORONARY ANGIOPLASTY  11/22/2011   3 vessels  . CORONARY ANGIOPLASTY WITH STENT PLACEMENT  11/22/2011   DES to LAD  . ESOPHAGOGASTRODUODENOSCOPY N/A 03/04/2016   Procedure: ESOPHAGOGASTRODUODENOSCOPY (EGD);  Surgeon: Manus Gunning, MD;  Location: [place];  Service: Gastroenterology;  Laterality: N/A;  . KNEE ARTHROSCOPY  W/ MENISCAL REPAIR  1990's   right  . LEFT HEART CATHETERIZATION WITH CORONARY ANGIOGRAM N/A 11/22/2011   Procedure: LEFT HEART CATHETERIZATION WITH CORONARY ANGIOGRAM;  Surgeon: Laverda Page, MD;  Location: [facility] CATH LAB;  Service: Cardiovascular;  Laterality: N/A;  . nasal cautery    . PERCUTANEOUS CORONARY STENT INTERVENTION (PCI-S)  11/22/2011   Procedure: PERCUTANEOUS CORONARY STENT INTERVENTION (PCI-S);  Surgeon: Laverda Page, MD;  Location: [facility] CATH LAB;  Service: Cardiovascular;;  . SKIN GRAFT  04/2009   right thigh to right "wrist all the way to my shoulder; necrotizing fasciitis   Social History:  reports that he quit smoking about 7 months ago. His smoking use included cigarettes. He has a 40.00 pack-year smoking history. He has never used smokeless tobacco. He reports that he does not drink alcohol or use drugs. Patient lives at [place] Reactions  . Ultram [Tramadol Hcl] Other (See Comments)    Seizures; "grand mal; twice"  . Gabapentin Other (See Comments)    Made pt dilusional  . Lyrica [Pregabalin] Other (See Comments)    Leg swelling.    Family History  Problem Relation Age of Onset  . Alzheimer's disease Mother   . Colon cancer Neg Hx       Prior to Admission medications   Medication Sig Start Date End Date Taking? Authorizing Provider  apixaban (ELIQUIS) 5 MG TABS tablet Take 5 mg by mouth 2 (two) times daily.   Yes [provider]  Armodafinil 150 MG tablet Take 150 mg by mouth daily.    Yes [provider]  atorvastatin (LIPITOR) 10 MG tablet Take 10 mg by mouth every evening.    Yes [provider]  budesonide-formoterol (SYMBICORT) 160-4.5 MCG/ACT inhaler Inhale 2 puffs into the lungs 2 (two) times daily. 02/06/17  Yes Tanda Rockers, MD  clonazePAM (KLONOPIN) 0.5 MG tablet Take 0.25 mg by mouth 2 (two) times daily.    Yes [provider]  diltiazem (CARDIZEM LA) 180 MG 24 hr tablet Take 180  mg by mouth daily.    Yes [provider]  DULoxetine (CYMBALTA) 60 MG capsule TAKE (1) CAPSULE BY MOUTH TWICE DAILY. 02/18/16  Yes Norman Clay, MD  ergocalciferol (VITAMIN D2) 50000 units capsule Take 50,000 Units by mouth every Monday.    Yes [provider]  ferrous sulfate 325 (65 FE) MG EC tablet Take 325 mg by mouth daily with breakfast.   Yes [provider]  methadone (DOLOPHINE) 10 MG tablet Take 20 mg by mouth every 12 (twelve) hours.    Yes [provider]  metoprolol tartrate (LOPRESSOR) 25 MG tablet Take 25 mg by mouth 2 (two) times daily.    Yes [provider]  omeprazole (PRILOSEC) 40 MG capsule Take 40 mg by mouth 2 (two) times daily.   Yes [provider]  traZODone (DESYREL) 100 MG tablet Take 100 mg by mouth at bedtime.  02/15/16  Yes [provider]  umeclidinium bromide (INCRUSE ELLIPTA)  62.5 MCG/INH AEPB Inhale 1 puff into the lungs daily.   Yes [provider]  nitroGLYCERIN (NITROSTAT) 0.3 MG SL tablet Place 0.4 mg under the tongue every 5 (five) minutes as needed for chest pain.    [provider]  OXYGEN Inhale 2 L into the lungs continuous. 2lpm 24/7     [provider]    Physical Exam: BP (!) 159/70   Pulse 74   Temp 98.6 F (37 C) (Oral)   Resp 17   Ht 5\' 8"  (1.727 m)   Wt 113.4 kg (250 lb)   SpO2 100%   BMI 38.01 kg/m   . General: Older Caucasian male. Awake and alert and oriented x3. No acute cardiopulmonary distress.  Marland Kitchen HEENT: Normocephalic atraumatic.  Right and left ears normal in appearance.  Pupils equal, round, reactive to light. Extraocular muscles are intact. Sclerae anicteric and noninjected.  Moist mucosal membranes. No mucosal lesions.  . Neck: Neck supple without lymphadenopathy. No carotid bruits. No masses palpated.  . Cardiovascular: Regular rate with normal S1-S2 sounds. No murmurs, rubs, gallops auscultated. No JVD.  Marland Kitchen Respiratory: Good respiratory  effort with no wheezes, rales, rhonchi. Lungs clear to auscultation bilaterally.  No accessory muscle use. . Abdomen: Obese.  Soft, suprapubic tenderness and tenderness over the lower abdomen.  No guarding or rebound. Nondistended. Active bowel sounds. No masses or hepatosplenomegaly  . Genital Exam: Indwelling Foley.  No erythema.  To urethra or glands. . Skin: No rashes, lesions, or ulcerations.  Dry, warm to touch. 2+ dorsalis pedis and radial pulses. . Musculoskeletal: No knee or ankle effusion, deformity, erythema.  No calf or leg pain. All major joints not erythematous nontender.  No upper or lower joint deformation.  Good ROM.  No contractures  . Psychiatric: Intact judgment and insight. Pleasant and cooperative. . Neurologic: No focal neurological deficits. Strength is 5/5 and symmetric in upper and lower extremities.  Cranial nerves II through XII are grossly intact.           Labs on Admission: I have personally reviewed following labs and imaging studies  CBC: Recent Labs  Lab 07/23/17 1726  WBC 12.9*  NEUTROABS 9.0  HGB 9.6*  HCT 32.9*  MCV 97.3  PLT 419   Basic Metabolic Panel: Recent Labs  Lab 07/23/17 1726  NA 141  K 4.2  CL 95*  CO2 34*  GLUCOSE 108*  BUN 33*  CREATININE 1.40*  CALCIUM 9.1   GFR: Estimated Creatinine Clearance: 64.3 mL/min (A) (by C-G formula based on SCr of 1.4 mg/dL (H)). Liver Function Tests: Recent Labs  Lab 07/23/17 1726  AST 25  ALT 20  ALKPHOS 103  BILITOT 0.5  PROT 7.3  ALBUMIN 3.2*   Recent Labs  Lab 07/23/17 1726  LIPASE 28   No results for input(s): AMMONIA in the last 168 hours. Coagulation Profile: No results for input(s): INR, PROTIME in the last 168 hours. Cardiac Enzymes: No results for input(s): CKTOTAL, CKMB, CKMBINDEX, TROPONINI in the last 168 hours. BNP (last 3 results) No results for input(s): PROBNP in the last 8760 hours. HbA1C: No results for input(s): HGBA1C in the last 72 hours. CBG: No  results for input(s): GLUCAP in the last 168 hours. Lipid Profile: No results for input(s): CHOL, HDL, LDLCALC, TRIG, CHOLHDL, LDLDIRECT in the last 72 hours. Thyroid Function Tests: No results for input(s): TSH, T4TOTAL, FREET4, T3FREE, THYROIDAB in the last 72 hours. Anemia Panel: No results for input(s): VITAMINB12, FOLATE, FERRITIN, TIBC,  IRON, RETICCTPCT in the last 72 hours. Urine analysis:    Component Value Date/Time   COLORURINE YELLOW 07/23/2017 1707   APPEARANCEUR CLOUDY (A) 07/23/2017 1707   LABSPEC 1.008 07/23/2017 1707   PHURINE 7.0 07/23/2017 1707   GLUCOSEU NEGATIVE 07/23/2017 1707   HGBUR LARGE (A) 07/23/2017 1707   BILIRUBINUR NEGATIVE 07/23/2017 1707   KETONESUR NEGATIVE 07/23/2017 1707   PROTEINUR NEGATIVE 07/23/2017 1707   UROBILINOGEN 2.0 (H) 11/15/2014 1745   NITRITE POSITIVE (A) 07/23/2017 1707   LEUKOCYTESUR LARGE (A) 07/23/2017 1707   Sepsis Labs: @LABRCNTIP (procalcitonin:4,lacticidven:4) )No results found for this or any previous visit (from the past 240 hour(s)).   Radiological Exams on Admission: Ct Abdomen Pelvis Wo Contrast  Result Date: 07/23/2017 CLINICAL DATA:  Abdominal distention, pain, UTI EXAM: CT ABDOMEN AND PELVIS WITHOUT CONTRAST TECHNIQUE: Multidetector CT imaging of the abdomen and pelvis was performed following the standard protocol without IV contrast. COMPARISON:  02/28/2017 FINDINGS: Lower chest: Lung bases are essentially clear. Hepatobiliary: Moderate hepatic steatosis. Gallbladder is unremarkable. No intrahepatic or extrahepatic ductal dilatation. Pancreas: Within normal limits. Spleen: Within normal limits. Adrenals/Urinary Tract: Adrenal glands are within normal limits. Kidneys are within normal limits. Bilateral extrarenal pelves. No ureteral or bladder calculi. No hydronephrosis. Thick-walled bladder with indwelling Foley catheter. Stomach/Bowel: Stomach is within normal limits. No evidence of bowel obstruction. Normal appendix  (series 2/image 30). No colonic wall thickening or inflammatory changes. Mild to moderate left colonic stool burden. Vascular/Lymphatic: No evidence of abdominal aortic aneurysm. Atherosclerotic calcifications of the abdominal aorta and branch vessels. No suspicious abdominopelvic lymphadenopathy. Reproductive: Prostate is unremarkable. High right testis in the distal left inguinal canal (series 2/image 78). Other: No abdominopelvic ascites. Musculoskeletal: Visualized osseous structures are within normal limits. IMPRESSION: Thick-walled bladder, correlating with the patient's history of UTI. Indwelling Foley catheter. No evidence of bowel obstruction. Normal appendix. Mild to moderate left colonic stool burden. Moderate hepatic steatosis. Electronically Signed   By: Julian Hy M.D.   On: 07/23/2017 20:50    Assessment/Plan: Principal Problem:   Catheter-associated urinary tract infection (HCC) Active Problems:   Chronic pain syndrome   Multiple sclerosis (HCC)   Atrial flutter (HCC)   Morbid obesity due to excess calories (HCC)   Neurogenic bladder   Chronic indwelling Foley catheter    This patient was discussed with the ED physician, including pertinent vitals, physical exam findings, labs, and imaging.  We also discussed care given by the ED provider.  1. Catheter associated UTI a. Admit to MedSurg b. Urine culture pending c. EDP obtained GC/Chlamydia d. Will also culture purulent drainage but this may represent MRSA e. Continue Primaxin f. Start vancomycin to cover MRSA, particularly given the patient has a history of MRSA UTI g. Repeat CBC in the morning 2. Chronic indwelling catheter a. Continue with Foley care -this will need to be replaced 3. Neurogenic bladder 4. Multiple sclerosis a. Continue home medications 5. Atrial fibrillation a. Continue home medications  6. Chronic pain syndrome a. continue methadone  7. IV fluids 8. acute renal injury a. Repeat creatinine  in the morning   DVT prophylaxis: Continue  Eliquis Consultants: None Code Status: Full code -this was discussed with the patient Family Communication: None Disposition Plan: Return to [facility] following improvement    Daren Doswell [place] [facility] Pager [phone]  If 7PM-7AM, please contact night-coverage [email or web address] Password [email or web address]

## 2017-07-23 NOTE — ED Notes (Signed)
Patient transported to CT 

## 2017-07-23 NOTE — ED Notes (Signed)
Remaining Vancomycin restarted at rate of 17ml/hr. Theadora Rama, RN receiving nurse on floor made aware. Barnett Applebaum, RN [facility] made aware. Rip Harbour, RN charge nurse made aware.

## 2017-07-23 NOTE — ED Notes (Signed)
Pt given meal tray and beverage per Dr Nehemiah Settle okay

## 2017-07-24 LAB — CBC
HCT: 30 % — ABNORMAL LOW (ref 39.0–52.0)
Hemoglobin: 8.9 g/dL — ABNORMAL LOW (ref 13.0–17.0)
MCH: 28.9 pg (ref 26.0–34.0)
MCHC: 29.7 g/dL — ABNORMAL LOW (ref 30.0–36.0)
MCV: 97.4 fL (ref 78.0–100.0)
Platelets: 265 10*3/uL (ref 150–400)
RBC: 3.08 MIL/uL — AB (ref 4.22–5.81)
RDW: 15.9 % — AB (ref 11.5–15.5)
WBC: 11.1 10*3/uL — AB (ref 4.0–10.5)

## 2017-07-24 LAB — BASIC METABOLIC PANEL
ANION GAP: 9 (ref 5–15)
BUN: 31 mg/dL — ABNORMAL HIGH (ref 6–20)
CALCIUM: 8.1 mg/dL — AB (ref 8.9–10.3)
CO2: 33 mmol/L — ABNORMAL HIGH (ref 22–32)
Chloride: 98 mmol/L — ABNORMAL LOW (ref 101–111)
Creatinine, Ser: 1.59 mg/dL — ABNORMAL HIGH (ref 0.61–1.24)
GFR calc non Af Amer: 44 mL/min — ABNORMAL LOW (ref >60)
GFR, EST AFRICAN AMERICAN: 51 mL/min — AB (ref >60)
Glucose, Bld: 122 mg/dL — ABNORMAL HIGH (ref 65–99)
Potassium: 4.2 mmol/L (ref 3.5–5.1)
Sodium: 140 mmol/L (ref 135–145)

## 2017-07-24 LAB — GC/CHLAMYDIA PROBE AMP (~~LOC~~) NOT AT ARMC
Chlamydia: NEGATIVE
Neisseria Gonorrhea: NEGATIVE

## 2017-07-24 LAB — MRSA PCR SCREENING: MRSA BY PCR: POSITIVE — AB

## 2017-07-24 MED ORDER — MUPIROCIN 2 % EX OINT
1.0000 "application " | TOPICAL_OINTMENT | Freq: Two times a day (BID) | CUTANEOUS | Status: DC
  Administered 2017-07-25: 1 via NASAL
  Filled 2017-07-24: qty 22

## 2017-07-24 MED ORDER — CHLORHEXIDINE GLUCONATE CLOTH 2 % EX PADS
6.0000 | MEDICATED_PAD | Freq: Every day | CUTANEOUS | Status: DC
  Administered 2017-07-25 – 2017-07-26 (×2): 6 via TOPICAL

## 2017-07-24 NOTE — Care Management (Signed)
CM consult for medication needs. Pt is from nursing facility and has medicaid. No assistance should be needed nor is any available as pt is insured. CM signing off.

## 2017-07-24 NOTE — Progress Notes (Addendum)
PROGRESS NOTE    Dale Shields  FYB:[id number] DOB: Sep 08, 1950 DOA: 07/23/2017 PCP: Monico Blitz, MD   Brief Narrative:   Dale Shields is a 67 y.o. male with a history of multiple sclerosis, neurogenic bladder with chronic indwelling catheter, recurrent UTI, COPD, CAD with stents x3, atrial fibrillation on anticoagulation, GERD, history of MRSA infection, history of Pseudomonas UTI.  The patient has had several episodes UTIs with prior cultures demonstrating Enterobacter and Pseudomonas.  He actually had a UTI 3 weeks ago with treatment, but symptoms continue to worsen with suprapubic tenderness and purulent penile drainage noted.  He has been started on vancomycin and Primaxin for treatment empirically with urine cultures pending.  Assessment & Plan:   Principal Problem:   Catheter-associated urinary tract infection (HCC) Active Problems:   Chronic pain syndrome   Multiple sclerosis (HCC)   AKI (acute kidney injury) ([place])   Atrial flutter (HCC)   Morbid obesity due to excess calories (HCC)   Neurogenic bladder   Chronic indwelling Foley catheter   1. Catheter associated UTI.  Continue to follow urine cultures and maintain on Primaxin and vancomycin as patient has had a prior history of MRSA UTI.  Continue to follow CBC.  Hold off on midline catheter placement versus PICC until final cultures result. 2. AK I.  Baseline creatinine of 1.0 noted on 02/2017.  Continue to avoid nephrotoxic agents and monitor vancomycin levels per pharmacy.  Negative fluid balance noted, therefore we will plan to continue aggressive IV fluid hydration. 3. Neurogenic bladder with chronic indwelling Foley catheter.  Continue with Foley care with replacement. 4. Multiple sclerosis.  Continue home medications. 5. Atrial fibrillation.  Continue on Eliquis and metoprolol with good heart rate control noted. 6. Chronic pain syndrome.  Continue methadone.   DVT prophylaxis: Eliquis Code Status:  Full Family Communication: None Disposition Plan: Continue IV antibiotic treatment as ordered and follow urine cultures.   Consultants:   None  Procedures:   None  Antimicrobials:   Vancomycin and Primaxin 4/14->   Subjective: Patient seen and evaluated today with no new acute complaints or concerns. No acute concerns or events noted overnight.  Objective: Vitals:   07/23/17 2155 07/23/17 2254 07/24/17 0454 07/24/17 0459  BP: (!) 146/74 (!) 163/66  (!) 111/59  Pulse: 87 78 84 70  Resp: 17 14 15    Temp:  98.3 F (36.8 C) 98.5 F (36.9 C)   TempSrc:  Oral Oral   SpO2: 100% 100% 98%   Weight:  116.4 kg (256 lb 9.9 oz)    Height:  5\' 8"  (1.727 m)      Intake/Output Summary (Last 24 hours) at 07/24/2017 1017 Last data filed at 07/24/2017 5277 Gross per 24 hour  Intake 1275.42 ml  Output 3500 ml  Net -2224.58 ml   Filed Weights   07/23/17 1523 07/23/17 2254  Weight: 113.4 kg (250 lb) 116.4 kg (256 lb 9.9 oz)    Examination:  General exam: Appears calm and comfortable  Respiratory system: Clear to auscultation. Respiratory effort normal. Cardiovascular system: S1 & S2 heard, RRR. No JVD, murmurs, rubs, gallops or clicks. No pedal edema. Gastrointestinal system: Abdomen is nondistended, soft and nontender. No organomegaly or masses felt. Normal bowel sounds heard. Central nervous system: Alert and oriented. No focal neurological deficits. Extremities: Symmetric 5 x 5 power. Skin: No rashes, lesions or ulcers Psychiatry: Judgement and insight appear normal. Mood & affect appropriate.     Data Reviewed: I have personally reviewed following  labs and imaging studies  CBC: Recent Labs  Lab 07/23/17 1726 07/24/17 0527  WBC 12.9* 11.1*  NEUTROABS 9.0  --   HGB 9.6* 8.9*  HCT 32.9* 30.0*  MCV 97.3 97.4  PLT 252 353   Basic Metabolic Panel: Recent Labs  Lab 07/23/17 1726 07/24/17 0527  NA 141 140  K 4.2 4.2  CL 95* 98*  CO2 34* 33*  GLUCOSE 108* 122*   BUN 33* 31*  CREATININE 1.40* 1.59*  CALCIUM 9.1 8.1*   GFR: Estimated Creatinine Clearance: 57.4 mL/min (A) (by C-G formula based on SCr of 1.59 mg/dL (H)). Liver Function Tests: Recent Labs  Lab 07/23/17 1726  AST 25  ALT 20  ALKPHOS 103  BILITOT 0.5  PROT 7.3  ALBUMIN 3.2*   Recent Labs  Lab 07/23/17 1726  LIPASE 28   No results for input(s): AMMONIA in the last 168 hours. Coagulation Profile: No results for input(s): INR, PROTIME in the last 168 hours. Cardiac Enzymes: No results for input(s): CKTOTAL, CKMB, CKMBINDEX, TROPONINI in the last 168 hours. BNP (last 3 results) No results for input(s): PROBNP in the last 8760 hours. HbA1C: No results for input(s): HGBA1C in the last 72 hours. CBG: No results for input(s): GLUCAP in the last 168 hours. Lipid Profile: No results for input(s): CHOL, HDL, LDLCALC, TRIG, CHOLHDL, LDLDIRECT in the last 72 hours. Thyroid Function Tests: No results for input(s): TSH, T4TOTAL, FREET4, T3FREE, THYROIDAB in the last 72 hours. Anemia Panel: No results for input(s): VITAMINB12, FOLATE, FERRITIN, TIBC, IRON, RETICCTPCT in the last 72 hours. Sepsis Labs: No results for input(s): PROCALCITON, LATICACIDVEN in the last 168 hours.  Recent Results (from the past 240 hour(s))  MRSA PCR Screening     Status: Abnormal   Collection Time: 07/23/17 10:45 PM  Result Value Ref Range Status   MRSA by PCR POSITIVE (A) NEGATIVE Final    Comment:        The GeneXpert MRSA Assay (FDA approved for NASAL specimens only), is one component of a comprehensive MRSA colonization surveillance program. It is not intended to diagnose MRSA infection nor to guide or monitor treatment for MRSA infections. RESULT CALLED TO, READ BACK BY AND VERIFIED WITH: HARRIS,B @ 0254 ON 07/24/17 BY JUW Performed at [facility], [address], [place], [place] [address]          Radiology Studies: Ct Abdomen Pelvis Wo Contrast  Result Date:  07/23/2017 CLINICAL DATA:  Abdominal distention, pain, UTI EXAM: CT ABDOMEN AND PELVIS WITHOUT CONTRAST TECHNIQUE: Multidetector CT imaging of the abdomen and pelvis was performed following the standard protocol without IV contrast. COMPARISON:  02/28/2017 FINDINGS: Lower chest: Lung bases are essentially clear. Hepatobiliary: Moderate hepatic steatosis. Gallbladder is unremarkable. No intrahepatic or extrahepatic ductal dilatation. Pancreas: Within normal limits. Spleen: Within normal limits. Adrenals/Urinary Tract: Adrenal glands are within normal limits. Kidneys are within normal limits. Bilateral extrarenal pelves. No ureteral or bladder calculi. No hydronephrosis. Thick-walled bladder with indwelling Foley catheter. Stomach/Bowel: Stomach is within normal limits. No evidence of bowel obstruction. Normal appendix (series 2/image 30). No colonic wall thickening or inflammatory changes. Mild to moderate left colonic stool burden. Vascular/Lymphatic: No evidence of abdominal aortic aneurysm. Atherosclerotic calcifications of the abdominal aorta and branch vessels. No suspicious abdominopelvic lymphadenopathy. Reproductive: Prostate is unremarkable. High right testis in the distal left inguinal canal (series 2/image 78). Other: No abdominopelvic ascites. Musculoskeletal: Visualized osseous structures are within normal limits. IMPRESSION: Thick-walled bladder, correlating with the patient's history of UTI.  Indwelling Foley catheter. No evidence of bowel obstruction. Normal appendix. Mild to moderate left colonic stool burden. Moderate hepatic steatosis. Electronically Signed   By: Julian Hy M.D.   On: 07/23/2017 20:50      Scheduled Meds: . apixaban  5 mg Oral BID  . atorvastatin  10 mg Oral QPM  . [START ON 07/25/2017] Chlorhexidine Gluconate Cloth  6 each Topical Q0600  . clonazePAM  0.25 mg Oral BID  . diltiazem  180 mg Oral Daily  . DULoxetine  60 mg Oral BID  . mouth rinse  15 mL Mouth Rinse  BID  . methadone  20 mg Oral Q12H  . metoprolol tartrate  25 mg Oral BID  . mometasone-formoterol  2 puff Inhalation BID  . [START ON 07/25/2017] mupirocin ointment  1 application Nasal BID  . pantoprazole  40 mg Oral Daily  . traZODone  100 mg Oral QHS  . umeclidinium bromide  1 puff Inhalation Daily   Continuous Infusions: . sodium chloride 125 mL/hr at 07/24/17 0643  . imipenem-cilastatin Stopped (07/24/17 0459)  . vancomycin       LOS: 1 day    Time spent: 30 minutes    Zakyria Metzinger Darleen Crocker, DO [facility] Pager [phone]  If 7PM-7AM, please contact night-coverage [email or web address] Password [facility] 07/24/2017, 10:17 AM

## 2017-07-24 NOTE — Progress Notes (Signed)
Spoke to [employer] regarding the midline order. She will follow up with the MD about the need to place a PICC or midline. She'll update the IV order and/or call in the office. [employer]

## 2017-07-24 NOTE — Clinical Social Work Note (Signed)
Clinical Social Work Assessment  Patient Details  Name: Dale Shields MRN: [id number] Date of Birth: 21-Jul-1950  Date of referral:  07/24/17               Reason for consult:  Other (Comment Required)(Patient is from [facility])                Permission sought to share information with:    Permission granted to share information::     Name::        Agency::  Lynnae Sandhoff, [place] [facility]  Relationship::     Contact Information:     Housing/Transportation Living arrangements for the past 2 months:  [place] of Information:  Patient, Facility Patient Interpreter Needed:  None Criminal Activity/Legal Involvement Pertinent to Current Situation/Hospitalization:  No - Comment as needed Significant Relationships:  Siblings Lives with:  Facility Resident Do you feel safe going back to the place where you live?  Yes Need for family participation in patient care:  Yes (Comment)  Care giving concerns:  Facility resident. None identified.    Social Worker assessment / plan:  Patient indicated that he has been a resident at [facility] for about a year. He ambulates around his room independently, however he uses a wheelchair or rollator for longer distances.  Staff assists him with showers.  Patient is agreeable to return. Lynnae Sandhoff at [facility] confirmed details provided by patient confirmed that he can return at discharge. LCSW advised that patient will discharge with IV antibiotics.   Employment status:  Disabled (Comment on whether or not currently receiving Disability) Insurance information:  Medicare, Medicaid In [place] PT Recommendations:  Not assessed at this time Information / Referral to community resources:     Patient/Family's Response to care:  Patient is agreeable to return to [facility]   Patient/Family's Understanding of and Emotional Response to Diagnosis, Current Treatment, and Prognosis:  Patient states that he understands his diagnosis, treatment and  prognosis and based on his understanding feels he should remain at the SNF level of care.   Emotional Assessment Appearance:  Appears stated age Attitude/Demeanor/Rapport:  Engaged Affect (typically observed):  Calm Orientation:  Oriented to Self, Oriented to Place, Oriented to  Time, Oriented to Situation Alcohol / Substance use:  Not Applicable Psych involvement (Current and /or in the community):  No (Comment)  Discharge Needs  Concerns to be addressed:  Other (Comment Required(Return to [facility]) Readmission within the last 30 days:  No Current discharge risk:  None Barriers to Discharge:  No Barriers Identified   Ihor Gully, LCSW 07/24/2017, 3:07 PM

## 2017-07-25 LAB — BASIC METABOLIC PANEL
Anion gap: 10 (ref 5–15)
BUN: 24 mg/dL — ABNORMAL HIGH (ref 6–20)
CHLORIDE: 97 mmol/L — AB (ref 101–111)
CO2: 33 mmol/L — AB (ref 22–32)
Calcium: 8.3 mg/dL — ABNORMAL LOW (ref 8.9–10.3)
Creatinine, Ser: 1.19 mg/dL (ref 0.61–1.24)
GFR calc non Af Amer: >60 mL/min (ref >60)
GLUCOSE: 123 mg/dL — AB (ref 65–99)
Potassium: 4.5 mmol/L (ref 3.5–5.1)
Sodium: 140 mmol/L (ref 135–145)

## 2017-07-25 LAB — CBC
HCT: 31.7 % — ABNORMAL LOW (ref 39.0–52.0)
HEMOGLOBIN: 9.2 g/dL — AB (ref 13.0–17.0)
MCH: 28.6 pg (ref 26.0–34.0)
MCHC: 29 g/dL — AB (ref 30.0–36.0)
MCV: 98.4 fL (ref 78.0–100.0)
Platelets: 237 10*3/uL (ref 150–400)
RBC: 3.22 MIL/uL — AB (ref 4.22–5.81)
RDW: 15.9 % — ABNORMAL HIGH (ref 11.5–15.5)
WBC: 10.5 10*3/uL (ref 4.0–10.5)

## 2017-07-25 LAB — URINE CULTURE

## 2017-07-25 MED ORDER — OXYCODONE HCL 5 MG PO TABS
10.0000 mg | ORAL_TABLET | Freq: Once | ORAL | Status: AC
  Administered 2017-07-25: 10 mg via ORAL
  Filled 2017-07-25: qty 2

## 2017-07-25 MED ORDER — POLYETHYLENE GLYCOL 3350 17 G PO PACK
17.0000 g | PACK | Freq: Once | ORAL | Status: AC
  Administered 2017-07-25: 17 g via ORAL
  Filled 2017-07-25: qty 1

## 2017-07-25 MED ORDER — GUAIFENESIN-DM 100-10 MG/5ML PO SYRP
5.0000 mL | ORAL_SOLUTION | ORAL | Status: DC | PRN
  Administered 2017-07-25: 5 mL via ORAL
  Filled 2017-07-25: qty 5

## 2017-07-25 MED ORDER — OXYCODONE HCL 5 MG PO TABS
10.0000 mg | ORAL_TABLET | Freq: Four times a day (QID) | ORAL | Status: DC | PRN
  Administered 2017-07-25 – 2017-07-26 (×2): 10 mg via ORAL
  Filled 2017-07-25 (×2): qty 2

## 2017-07-25 NOTE — Progress Notes (Signed)
PROGRESS NOTE    Dale Shields  IRS:[id number] DOB: 1950/12/10 DOA: 07/23/2017 PCP: Monico Blitz, MD   Brief Narrative:   Dale Shields is a 67 y.o. male with a history of multiple sclerosis, neurogenic bladder with chronic indwelling catheter, recurrent UTI, COPD, CAD with stents x3, atrial fibrillation on anticoagulation, GERD, history of MRSA infection, history of Pseudomonas UTI.  The patient has had several episodes of UTIs with prior cultures demonstrating Enterobacter and Pseudomonas.  He actually had a UTI 3 weeks ago with treatment, but symptoms continue to worsen with suprapubic tenderness and purulent penile drainage noted.  He has been started on vancomycin and Primaxin for treatment empirically with urine cultures pending.  So far, cultures of demonstrated growth of multiple organisms.  Assessment & Plan:   Principal Problem:   Catheter-associated urinary tract infection (HCC) Active Problems:   Chronic pain syndrome   Multiple sclerosis (HCC)   AKI (acute kidney injury) ([place])   Atrial flutter (HCC)   Morbid obesity due to excess calories (HCC)   Neurogenic bladder   Chronic indwelling Foley catheter   1. Catheter associated UTI.  Continue to follow urine cultures and maintain on Primaxin and vancomycin as patient has had a prior history of MRSA UTI.  Continue to follow CBC.  Hold off on midline catheter placement versus PICC until final cultures result.  Repeat cultures obtained today as initial had multiple organisms. 2. AK I-resolving.  Baseline creatinine of 1.0 noted on 02/2017.  Continue to avoid nephrotoxic agents and monitor vancomycin levels per pharmacy.  Fluid balance starting to become positive, therefore we will plan to continue aggressive IV fluid hydration. 3. Neurogenic bladder with chronic indwelling Foley catheter.  Continue with Foley care with replacement. 4. Multiple sclerosis.  Continue home medications. 5. Atrial fibrillation.  Continue on  Eliquis and metoprolol with good heart rate control noted. 6. Chronic pain syndrome.  Continue methadone.  Oxycodone added every 6 hours as needed today.   DVT prophylaxis: Eliquis Code Status: Full Family Communication: None Disposition Plan: Continue IV antibiotic treatment as ordered and follow urine cultures.   Consultants:   None  Procedures:   None  Antimicrobials:   Vancomycin and Primaxin 4/14->   Subjective: Patient seen and evaluated today with no new acute complaints or concerns. No acute concerns or events noted overnight.  He is having some mild lower extremity pain for which were oxycodone has been started as needed for pain relief.  Objective: Vitals:   07/24/17 2022 07/24/17 2054 07/25/17 0533 07/25/17 0850  BP: (!) 109/29 120/60 (!) 124/49   Pulse: 69  65   Resp:      Temp: 98.5 F (36.9 C)  98.7 F (37.1 C)   TempSrc: Oral  Oral   SpO2: 96%  95% 96%  Weight:      Height:        Intake/Output Summary (Last 24 hours) at 07/25/2017 0941 Last data filed at 07/25/2017 0541 Gross per 24 hour  Intake 3877.08 ml  Output 3750 ml  Net 127.08 ml   Filed Weights   07/23/17 1523 07/23/17 2254  Weight: 113.4 kg (250 lb) 116.4 kg (256 lb 9.9 oz)    Examination:  General exam: Appears calm and comfortable  Respiratory system: Clear to auscultation. Respiratory effort normal. Cardiovascular system: S1 & S2 heard, RRR. No JVD, murmurs, rubs, gallops or clicks. No pedal edema.  Foley with clear, yellow urine output. Gastrointestinal system: Abdomen is nondistended, soft and nontender. No  organomegaly or masses felt. Normal bowel sounds heard. Central nervous system: Alert and oriented. No focal neurological deficits. Extremities: Symmetric 5 x 5 power. Skin: No rashes, lesions or ulcers Psychiatry: Judgement and insight appear normal. Mood & affect appropriate.     Data Reviewed: I have personally reviewed following labs and imaging  studies  CBC: Recent Labs  Lab 07/23/17 1726 07/24/17 0527 07/25/17 0542  WBC 12.9* 11.1* 10.5  NEUTROABS 9.0  --   --   HGB 9.6* 8.9* 9.2*  HCT 32.9* 30.0* 31.7*  MCV 97.3 97.4 98.4  PLT 252 265 308   Basic Metabolic Panel: Recent Labs  Lab 07/23/17 1726 07/24/17 0527 07/25/17 0542  NA 141 140 140  K 4.2 4.2 4.5  CL 95* 98* 97*  CO2 34* 33* 33*  GLUCOSE 108* 122* 123*  BUN 33* 31* 24*  CREATININE 1.40* 1.59* 1.19  CALCIUM 9.1 8.1* 8.3*   GFR: Estimated Creatinine Clearance: 76.7 mL/min (by C-G formula based on SCr of 1.19 mg/dL). Liver Function Tests: Recent Labs  Lab 07/23/17 1726  AST 25  ALT 20  ALKPHOS 103  BILITOT 0.5  PROT 7.3  ALBUMIN 3.2*   Recent Labs  Lab 07/23/17 1726  LIPASE 28   No results for input(s): AMMONIA in the last 168 hours. Coagulation Profile: No results for input(s): INR, PROTIME in the last 168 hours. Cardiac Enzymes: No results for input(s): CKTOTAL, CKMB, CKMBINDEX, TROPONINI in the last 168 hours. BNP (last 3 results) No results for input(s): PROBNP in the last 8760 hours. HbA1C: No results for input(s): HGBA1C in the last 72 hours. CBG: No results for input(s): GLUCAP in the last 168 hours. Lipid Profile: No results for input(s): CHOL, HDL, LDLCALC, TRIG, CHOLHDL, LDLDIRECT in the last 72 hours. Thyroid Function Tests: No results for input(s): TSH, T4TOTAL, FREET4, T3FREE, THYROIDAB in the last 72 hours. Anemia Panel: No results for input(s): VITAMINB12, FOLATE, FERRITIN, TIBC, IRON, RETICCTPCT in the last 72 hours. Sepsis Labs: No results for input(s): PROCALCITON, LATICACIDVEN in the last 168 hours.  Recent Results (from the past 240 hour(s))  Urine culture     Status: Abnormal   Collection Time: 07/23/17  5:07 PM  Result Value Ref Range Status   Specimen Description   Final    URINE, CATHETERIZED Performed at [facility], [address] [place], [place] [address]    Special Requests   Final     NONE Performed at [facility], [address] [place], [place] [address]    Culture MULTIPLE SPECIES PRESENT, SUGGEST RECOLLECTION (A)  Final   Report Status 07/25/2017 FINAL  Final  Wound or Superficial Culture     Status: None (Preliminary result)   Collection Time: 07/23/17  9:15 PM  Result Value Ref Range Status   Specimen Description   Final    PENIS Performed at [facility], [address] [place], [place] [address]    Special Requests   Final    NONE Performed at [facility], [address], [place], [place] [address]    Gram Stain   Final    RARE WBC PRESENT, PREDOMINANTLY PMN FEW GRAM POSITIVE RODS FEW GRAM POSITIVE COCCI IN PAIRS Performed at [facility], [place] [address] [place],  [address]    Culture PENDING  Incomplete   Report Status PENDING  Incomplete  MRSA PCR Screening     Status: Abnormal   Collection Time: 07/23/17 10:45 PM  Result Value Ref Range Status  MRSA by PCR POSITIVE (A) NEGATIVE Final    Comment:        The GeneXpert MRSA Assay (FDA approved for NASAL specimens only), is one component of a comprehensive MRSA colonization surveillance program. It is not intended to diagnose MRSA infection nor to guide or monitor treatment for MRSA infections. RESULT CALLED TO, READ BACK BY AND VERIFIED WITH: HARRIS,B @ 0254 ON 07/24/17 BY JUW Performed at [facility], [address], [place], [place] [address]          Radiology Studies: Ct Abdomen Pelvis Wo Contrast  Result Date: 07/23/2017 CLINICAL DATA:  Abdominal distention, pain, UTI EXAM: CT ABDOMEN AND PELVIS WITHOUT CONTRAST TECHNIQUE: Multidetector CT imaging of the abdomen and pelvis was performed following the standard protocol without IV contrast. COMPARISON:  02/28/2017 FINDINGS: Lower chest: Lung bases are essentially clear. Hepatobiliary: Moderate hepatic steatosis. Gallbladder is unremarkable. No intrahepatic or extrahepatic ductal dilatation. Pancreas: Within normal  limits. Spleen: Within normal limits. Adrenals/Urinary Tract: Adrenal glands are within normal limits. Kidneys are within normal limits. Bilateral extrarenal pelves. No ureteral or bladder calculi. No hydronephrosis. Thick-walled bladder with indwelling Foley catheter. Stomach/Bowel: Stomach is within normal limits. No evidence of bowel obstruction. Normal appendix (series 2/image 30). No colonic wall thickening or inflammatory changes. Mild to moderate left colonic stool burden. Vascular/Lymphatic: No evidence of abdominal aortic aneurysm. Atherosclerotic calcifications of the abdominal aorta and branch vessels. No suspicious abdominopelvic lymphadenopathy. Reproductive: Prostate is unremarkable. High right testis in the distal left inguinal canal (series 2/image 78). Other: No abdominopelvic ascites. Musculoskeletal: Visualized osseous structures are within normal limits. IMPRESSION: Thick-walled bladder, correlating with the patient's history of UTI. Indwelling Foley catheter. No evidence of bowel obstruction. Normal appendix. Mild to moderate left colonic stool burden. Moderate hepatic steatosis. Electronically Signed   By: Julian Hy M.D.   On: 07/23/2017 20:50      Scheduled Meds: . apixaban  5 mg Oral BID  . atorvastatin  10 mg Oral QPM  . Chlorhexidine Gluconate Cloth  6 each Topical Q0600  . clonazePAM  0.25 mg Oral BID  . diltiazem  180 mg Oral Daily  . DULoxetine  60 mg Oral BID  . mouth rinse  15 mL Mouth Rinse BID  . methadone  20 mg Oral Q12H  . metoprolol tartrate  25 mg Oral BID  . mometasone-formoterol  2 puff Inhalation BID  . mupirocin ointment  1 application Nasal BID  . pantoprazole  40 mg Oral Daily  . traZODone  100 mg Oral QHS  . umeclidinium bromide  1 puff Inhalation Daily   Continuous Infusions: . sodium chloride 125 mL/hr at 07/25/17 0145  . imipenem-cilastatin Stopped (07/25/17 4627)  . vancomycin Stopped (07/25/17 0138)     LOS: 2 days    Time spent:  30 minutes    Imogen Maddalena Darleen Crocker, DO [facility] Pager [phone]  If 7PM-7AM, please contact night-coverage [email or web address] Password [facility] 07/25/2017, 9:41 AM

## 2017-07-26 DIAGNOSIS — Z960 Presence of urogenital implants: Secondary | ICD-10-CM

## 2017-07-26 DIAGNOSIS — G35 Multiple sclerosis: Secondary | ICD-10-CM

## 2017-07-26 DIAGNOSIS — N179 Acute kidney failure, unspecified: Secondary | ICD-10-CM

## 2017-07-26 DIAGNOSIS — T83510A Infection and inflammatory reaction due to cystostomy catheter, initial encounter: Secondary | ICD-10-CM

## 2017-07-26 DIAGNOSIS — I4892 Unspecified atrial flutter: Secondary | ICD-10-CM

## 2017-07-26 DIAGNOSIS — G894 Chronic pain syndrome: Secondary | ICD-10-CM

## 2017-07-26 DIAGNOSIS — N390 Urinary tract infection, site not specified: Secondary | ICD-10-CM

## 2017-07-26 DIAGNOSIS — N319 Neuromuscular dysfunction of bladder, unspecified: Secondary | ICD-10-CM

## 2017-07-26 LAB — BASIC METABOLIC PANEL
Anion gap: 10 (ref 5–15)
BUN: 20 mg/dL (ref 6–20)
CALCIUM: 8.3 mg/dL — AB (ref 8.9–10.3)
CO2: 32 mmol/L (ref 22–32)
CREATININE: 1.09 mg/dL (ref 0.61–1.24)
Chloride: 98 mmol/L — ABNORMAL LOW (ref 101–111)
GFR calc Af Amer: >60 mL/min (ref >60)
GLUCOSE: 113 mg/dL — AB (ref 65–99)
Potassium: 4.4 mmol/L (ref 3.5–5.1)
Sodium: 140 mmol/L (ref 135–145)

## 2017-07-26 LAB — CBC
HCT: 30.1 % — ABNORMAL LOW (ref 39.0–52.0)
Hemoglobin: 8.6 g/dL — ABNORMAL LOW (ref 13.0–17.0)
MCH: 28.1 pg (ref 26.0–34.0)
MCHC: 28.6 g/dL — AB (ref 30.0–36.0)
MCV: 98.4 fL (ref 78.0–100.0)
Platelets: 236 10*3/uL (ref 150–400)
RBC: 3.06 MIL/uL — ABNORMAL LOW (ref 4.22–5.81)
RDW: 15.9 % — AB (ref 11.5–15.5)
WBC: 10 10*3/uL (ref 4.0–10.5)

## 2017-07-26 LAB — VANCOMYCIN, TROUGH: Vancomycin Tr: 21 ug/mL (ref 15–20)

## 2017-07-26 MED ORDER — CLONAZEPAM 0.5 MG PO TABS: 0.2500 mg | ORAL_TABLET | Freq: Two times a day (BID) | ORAL | 0 refills | Status: AC

## 2017-07-26 MED ORDER — FOSFOMYCIN TROMETHAMINE 3 G PO PACK
3.0000 g | PACK | Freq: Once | ORAL | Status: AC
Start: 2017-07-26 — End: 2017-07-26
  Administered 2017-07-26: 3 g via ORAL
  Filled 2017-07-26: qty 3

## 2017-07-26 MED ORDER — FOSFOMYCIN TROMETHAMINE 3 G PO PACK: 3.0000 g | PACK | Freq: Once | ORAL | 0 refills | Status: AC

## 2017-07-26 MED ORDER — METHADONE HCL 10 MG PO TABS: 20.0000 mg | ORAL_TABLET | Freq: Two times a day (BID) | ORAL | 0 refills | Status: AC

## 2017-07-26 NOTE — Discharge Summary (Signed)
Physician Discharge Summary  HORACE LUKAS EHU:[id number] DOB: 1950-09-02 DOA: 07/23/2017  PCP: Monico Blitz, MD  Admit date: 07/23/2017 Discharge date: 07/26/2017  Time spent: 35 minutes  Recommendations for Outpatient Follow-up:  1. Repeat BMET to follow electrolytes and renal function    Discharge Diagnoses:  Principal Problem:   Catheter-associated urinary tract infection ([place]) Active Problems:   Chronic pain syndrome   Multiple sclerosis (HCC)   AKI (acute kidney injury) ([place])   Atrial flutter ([place])   Morbid obesity due to excess calories ([place])   Neurogenic bladder   Chronic indwelling Foley catheter   Discharge Condition: Stable and improved.  Patient has been discharged back to skilled nursing facility for further care and rehabilitation.  Follow-up with PCP in 10 days.  Diet recommendation: heart healthy diet   Filed Weights   07/23/17 1523 07/23/17 2254  Weight: 113.4 kg (250 lb) 116.4 kg (256 lb 9.9 oz)    History of present illness:  As per H&P written by Dr. Nehemiah Settle on 07/23/17 67 y.o. male with a history of multiple sclerosis, neurogenic bladder with chronic indwelling catheter, recurrent UTI, COPD, CAD with stents x3, atrial fibrillation on anticoagulation, GERD, history of MRSA infection, history of Pseudomonas UTI.  The patient has had several episodes UTIs.  His last urine culture in our system was in December 2018 which showed Enterobacter.  In January 2018, the patient has had a urine culture with Enterobacter and Pseudomonas.  He was treated for UTI approximately 3 weeks ago at [facility].  His symptoms continued to [place].  Now the patient is complaining of tenderness in his pelvis with purulent penile discharge.  No radiation to the patient's pelvic pain.  No palliating or provoking factors.  Denies fevers, chills, nausea, vomiting.  Emergency Department Course: White count of 13,000.  Creatinine elevated 1.4.  CT shows thickened bladder consistent  with UTI.  No pelvic abscess or renal abscess.  UA consistent with UTI.   Hospital Course:  1-catheter associated/complicated UTI: Patient with chronic Foley catheter secondary to neurogenic bladder in the setting of MS.  Multiple prior UTI's  -Patient's blood culture and urine culture remained w/o specific growth -no fever, WBC's trending appropriately and w/o signs of systemic infection currently -patient treated with 4 days Primaxin and Vancomycin  -then received 1 dose of fosfomycin prior to discharge and has instructions of repeated dose 3 days later after discharge. Plan is to complete a total of 10 days of antibiotics. -foley was exchanged -needs proper care and exchanges every 3 weeks -advise to maintain adequate hydration   2-AKI: in the setting of infection and dehydration -renal function back to normal after discharge  -patient advise to keep himself well hydrated   3-neurogenic bladder -continue chronic foley   4-MS -continue supportive care and home meds  5-A. Flutter/A. Fib -CHADsVASC score 4 -continue diltiazem, metoprolol and eliquis -no signs of acute bleeding   6-chronic pain syndrome -continue methadone   7-depression and anxiety -continue cymbalta, trazodone and PRN clonazepam   8-HLD -continue lipitor   9-chronic resp failure due to COPD and OSA/hypopnea -continue armodafinil  -resume home inhalers/nebulizer therapy -continue O2 supplementation   10-morbid obesity  -Body mass index is 39.02 kg/m. -low calorie diet and increase exercise discussed with patient   Procedures:  See below for x-ray reports   Consultations:  None   Discharge Exam: Vitals:   07/26/17 0802 07/26/17 1315  BP:  (!) 114/56  Pulse:  (!) 59  Resp:  18  Temp:  98.8 F (37.1 C)  SpO2: 94% 97%    General: Afebrile, denies chest pain, no shortness of breath.  Patient comfortable and in no acute distress.  Wearing his chronic nasal cannula oxygen supplementation and  currently without any nausea, vomiting, abdominal pain or any other acute complaints. Cardiovascular: S1 and S2, no rubs, no gallops, no JVD (even difficult to assess secondary to patient's body habitus). Respiratory: Good air movement bilaterally, no wheezing, no crackles Abdomen: Soft, obese, nontender, nondistended, positive bowel sounds Extremities: No cyanosis, no clubbing  Discharge Instructions   Discharge Instructions    Diet - low sodium heart healthy   Complete by:  As directed    Discharge instructions   Complete by:  As directed    Take medications as prescribed Keep yourself well-hydrated Follow heart healthy diet Please make sure that the patient's Foley catheter is properly replace every 3 weeks Arrange follow-up with PCP in 10 days.     Allergies as of 07/26/2017      Reactions   Ultram [tramadol Hcl] Other (See Comments)   Seizures; "grand mal; twice"   Gabapentin Other (See Comments)   Made pt dilusional   Lyrica [pregabalin] Other (See Comments)   Leg swelling.      Medication List    TAKE these medications   Armodafinil 150 MG tablet Take 150 mg by mouth daily.   atorvastatin 10 MG tablet Commonly known as:  LIPITOR Take 10 mg by mouth every evening.   budesonide-formoterol 160-4.5 MCG/ACT inhaler Commonly known as:  SYMBICORT Inhale 2 puffs into the lungs 2 (two) times daily.   clonazePAM 0.5 MG tablet Commonly known as:  KLONOPIN Take 0.5 tablets (0.25 mg total) by mouth 2 (two) times daily.   diltiazem 180 MG 24 hr tablet Commonly known as:  CARDIZEM LA Take 180 mg by mouth daily.   DULoxetine 60 MG capsule Commonly known as:  CYMBALTA TAKE (1) CAPSULE BY MOUTH TWICE DAILY.   ELIQUIS 5 MG Tabs tablet Generic drug:  apixaban Take 5 mg by mouth 2 (two) times daily.   ergocalciferol 50000 units capsule Commonly known as:  VITAMIN D2 Take 50,000 Units by mouth every Monday.   ferrous sulfate 325 (65 FE) MG EC tablet Take 325 mg by  mouth daily with breakfast.   fosfomycin 3 g Pack Commonly known as:  MONUROL Take 3 g by mouth once for 1 dose. Start taking on:  07/29/2017   INCRUSE ELLIPTA 62.5 MCG/INH Aepb Generic drug:  umeclidinium bromide Inhale 1 puff into the lungs daily.   methadone 10 MG tablet Commonly known as:  DOLOPHINE Take 2 tablets (20 mg total) by mouth every 12 (twelve) hours.   metoprolol tartrate 25 MG tablet Commonly known as:  LOPRESSOR Take 25 mg by mouth 2 (two) times daily.   nitroGLYCERIN 0.3 MG SL tablet Commonly known as:  NITROSTAT Place 0.4 mg under the tongue every 5 (five) minutes as needed for chest pain.   omeprazole 40 MG capsule Commonly known as:  PRILOSEC Take 40 mg by mouth 2 (two) times daily.   OXYGEN Inhale 2 L into the lungs continuous. 2lpm 24/7   traZODone 100 MG tablet Commonly known as:  DESYREL Take 100 mg by mouth at bedtime.      Allergies  Allergen Reactions  . Ultram [Tramadol Hcl] Other (See Comments)    Seizures; "grand mal; twice"  . Gabapentin Other (See Comments)    Made pt dilusional  .  Lyrica [Pregabalin] Other (See Comments)    Leg swelling.   Follow-up Information    Monico Blitz, MD. Schedule an appointment as soon as possible for a visit in 10 day(s).   Specialty:  Internal Medicine Contact information: [place] [address] [phone]           The results of significant diagnostics from this hospitalization (including imaging, microbiology, ancillary and laboratory) are listed below for reference.    Significant Diagnostic Studies: Ct Abdomen Pelvis Wo Contrast  Result Date: 07/23/2017 CLINICAL DATA:  Abdominal distention, pain, UTI EXAM: CT ABDOMEN AND PELVIS WITHOUT CONTRAST TECHNIQUE: Multidetector CT imaging of the abdomen and pelvis was performed following the standard protocol without IV contrast. COMPARISON:  02/28/2017 FINDINGS: Lower chest: Lung bases are essentially clear. Hepatobiliary: Moderate  hepatic steatosis. Gallbladder is unremarkable. No intrahepatic or extrahepatic ductal dilatation. Pancreas: Within normal limits. Spleen: Within normal limits. Adrenals/Urinary Tract: Adrenal glands are within normal limits. Kidneys are within normal limits. Bilateral extrarenal pelves. No ureteral or bladder calculi. No hydronephrosis. Thick-walled bladder with indwelling Foley catheter. Stomach/Bowel: Stomach is within normal limits. No evidence of bowel obstruction. Normal appendix (series 2/image 30). No colonic wall thickening or inflammatory changes. Mild to moderate left colonic stool burden. Vascular/Lymphatic: No evidence of abdominal aortic aneurysm. Atherosclerotic calcifications of the abdominal aorta and branch vessels. No suspicious abdominopelvic lymphadenopathy. Reproductive: Prostate is unremarkable. High right testis in the distal left inguinal canal (series 2/image 78). Other: No abdominopelvic ascites. Musculoskeletal: Visualized osseous structures are within normal limits. IMPRESSION: Thick-walled bladder, correlating with the patient's history of UTI. Indwelling Foley catheter. No evidence of bowel obstruction. Normal appendix. Mild to moderate left colonic stool burden. Moderate hepatic steatosis. Electronically Signed   By: Julian Hy M.D.   On: 07/23/2017 20:50    Microbiology: Recent Results (from the past 240 hour(s))  Urine culture     Status: Abnormal   Collection Time: 07/23/17  5:07 PM  Result Value Ref Range Status   Specimen Description   Final    URINE, CATHETERIZED Performed at [facility], [address] [place], [place] [address]    Special Requests   Final    NONE Performed at [facility], [address], [place], [place] [address]    Culture MULTIPLE SPECIES PRESENT, SUGGEST RECOLLECTION (A)  Final   Report Status 07/25/2017 FINAL  Final  Wound or Superficial Culture     Status: None (Preliminary result)   Collection Time: 07/23/17  9:15 PM  Result  Value Ref Range Status   Specimen Description   Final    PENIS Performed at [facility], [address], [place], [place] [address]    Special Requests   Final    NONE Performed at [facility], [address], [place], [place] [address]    Gram Stain   Final    RARE WBC PRESENT, PREDOMINANTLY PMN FEW GRAM POSITIVE RODS FEW GRAM POSITIVE COCCI IN PAIRS    Culture   Final    MODERATE STAPHYLOCOCCUS AUREUS SUSCEPTIBILITIES TO FOLLOW Performed at [facility], [place] [address] [place], [place] [address]    Report Status PENDING  Incomplete  MRSA PCR Screening     Status: Abnormal   Collection Time: 07/23/17 10:45 PM  Result Value Ref Range Status   MRSA by PCR POSITIVE (A) NEGATIVE Final    Comment:        The GeneXpert MRSA Assay (FDA approved for NASAL specimens only), is one component  of a comprehensive MRSA colonization surveillance program. It is not intended to diagnose MRSA infection nor to guide or monitor treatment for MRSA infections. RESULT CALLED TO, READ BACK BY AND VERIFIED WITH: [place],B @ 0254 ON 07/24/17 BY JUW Performed at [facility], [address], [place], [place] [address]      Labs: Basic Metabolic Panel: Recent Labs  Lab 07/23/17 1726 07/24/17 0527 07/25/17 0542 07/26/17 0509  NA 141 140 140 140  K 4.2 4.2 4.5 4.4  CL 95* 98* 97* 98*  CO2 34* 33* 33* 32  GLUCOSE 108* 122* 123* 113*  BUN 33* 31* 24* 20  CREATININE 1.40* 1.59* 1.19 1.09  CALCIUM 9.1 8.1* 8.3* 8.3*   Liver Function Tests: Recent Labs  Lab 07/23/17 1726  AST 25  ALT 20  ALKPHOS 103  BILITOT 0.5  PROT 7.3  ALBUMIN 3.2*   Recent Labs  Lab 07/23/17 1726  LIPASE 28   CBC: Recent Labs  Lab 07/23/17 1726 07/24/17 0527 07/25/17 0542 07/26/17 0509  WBC 12.9* 11.1* 10.5 10.0  NEUTROABS 9.0  --   --   --   HGB 9.6* 8.9* 9.2* 8.6*  HCT 32.9* 30.0* 31.7* 30.1*  MCV 97.3 97.4 98.4 98.4  PLT 252 265 237 236     Signed:  Barton Dubois MD.  [facility] 07/26/2017, 3:35 PM

## 2017-07-26 NOTE — NC FL2 (Signed)
[place] MEDICAID FL2 LEVEL OF CARE SCREENING TOOL     IDENTIFICATION  Patient Name: Dale Shields Birthdate: 10-Mar-1951 Sex: male Admission Date (Current Location): 07/23/2017  [facility] and [place] Number:  [employer] and Address:  [place] [address], [place]      Provider Number: [id number]  Attending Physician Name and Address:  Barton Dubois, MD  Relative Name and Phone Number:       Current Level of Care: Hospital Recommended Level of Care: [place] Prior Approval Number:    Date Approved/Denied:   PASRR Number:    Discharge Plan: SNF    Current Diagnoses: Patient Active Problem List   Diagnosis Date Noted  . Neurogenic bladder 07/23/2017  . Chronic indwelling Foley catheter 07/23/2017  . Chronic respiratory failure with hypoxia and hypercapnia ([place]) 12/02/2016  . Morbid obesity due to excess calories ([place]) 12/02/2016  . Atrial flutter ([place]) 11/07/2016  . SOB (shortness of breath) 03/01/2016  . Anemia 03/01/2016  . Major depressive disorder, recurrent episode, moderate ([place]) 01/15/2016  . Depression 01/11/2016  . Hyperlipidemia 01/11/2016  . Anxiety 01/11/2016  . Chest pain 04/06/2015  . Hydronephrosis 11/18/2014  . Catheter-associated urinary tract infection ([place])   . Malnutrition of moderate degree ([place]) 11/17/2014  . AKI (acute kidney injury) ([place]) 11/16/2014  . Hematuria 11/16/2014  . Generalized weakness 11/16/2014  . FTT (failure to thrive) in adult 11/16/2014  . Anemia of chronic disease 11/16/2014  . COPD  GOLD IV 11/15/2014  . CAD (coronary artery disease) 11/15/2014  . Chronic pain syndrome 04/11/2013  . Multiple sclerosis ([place]) 04/11/2013    Orientation RESPIRATION BLADDER Height & Weight     Time, Self, Situation, Place  O2(2L) Indwelling catheter Weight: 256 lb 9.9 oz (116.4 kg) Height:  5\' 8"  (172.7 cm)  BEHAVIORAL SYMPTOMS/MOOD NEUROLOGICAL BOWEL NUTRITION STATUS   Continent Diet(see discharge summary)  AMBULATORY STATUS COMMUNICATION OF NEEDS Skin   Limited Assist Verbally Normal                       Personal Care Assistance Level of Assistance  Bathing, Feeding, Dressing Bathing Assistance: Limited assistance Feeding assistance: Independent Dressing Assistance: Limited assistance     Functional Limitations Info  Sight, Speech, Hearing Sight Info: Adequate Hearing Info: Adequate Speech Info: Adequate    SPECIAL CARE FACTORS FREQUENCY                       Contractures Contractures Info: Not present    Additional Factors Info  Code Status, Allergies, Psychotropic Code Status Info: Full Code Allergies Info: Ultram, Gabapentin, Lyrica Psychotropic Info: Cymbalta, Desyrel         Current Medications (07/26/2017):  This is the current hospital active medication list Current Facility-Administered Medications  Medication Dose Route Frequency Provider Last Rate Last Dose  . 0.9 %  sodium chloride infusion   Intravenous Continuous Truett Mainland, DO 125 mL/hr at 07/26/17 0857    . acetaminophen (TYLENOL) tablet 650 mg  650 mg Oral Q6H PRN Truett Mainland, DO   650 mg at 07/24/17 1554   Or  . acetaminophen (TYLENOL) suppository 650 mg  650 mg Rectal Q6H PRN Truett Mainland, DO      . apixaban (ELIQUIS) tablet 5 mg  5 mg Oral BID Truett Mainland, DO   5 mg at 07/26/17 0849  . atorvastatin (LIPITOR) tablet 10 mg  10  mg Oral QPM Truett Mainland, DO   10 mg at 07/25/17 1704  . Chlorhexidine Gluconate Cloth 2 % PADS 6 each  6 each Topical Q0600 Truett Mainland, DO   6 each at 07/26/17 6546  . clonazePAM (KLONOPIN) tablet 0.25 mg  0.25 mg Oral BID Truett Mainland, DO   0.25 mg at 07/26/17 1007  . diltiazem (CARDIZEM CD) 24 hr capsule 180 mg  180 mg Oral Daily Truett Mainland, DO   180 mg at 07/26/17 0849  . DULoxetine (CYMBALTA) DR capsule 60 mg  60 mg Oral BID Truett Mainland, DO   60 mg at 07/26/17 0849  .  guaiFENesin-dextromethorphan (ROBITUSSIN DM) 100-10 MG/5ML syrup 5 mL  5 mL Oral Q4H PRN Manuella Ghazi, Pratik D, DO   5 mL at 07/25/17 1704  . MEDLINE mouth rinse  15 mL Mouth Rinse BID Truett Mainland, DO   15 mL at 07/25/17 2132  . methadone (DOLOPHINE) tablet 20 mg  20 mg Oral Q12H Truett Mainland, DO   20 mg at 07/26/17 5035  . metoprolol tartrate (LOPRESSOR) tablet 25 mg  25 mg Oral BID Truett Mainland, DO   25 mg at 07/26/17 0849  . mometasone-formoterol (DULERA) 200-5 MCG/ACT inhaler 2 puff  2 puff Inhalation BID Truett Mainland, DO   2 puff at 07/26/17 0759  . mupirocin ointment (BACTROBAN) 2 % 1 application  1 application Nasal BID Truett Mainland, DO   1 application at 46/56/81 2127  . ondansetron (ZOFRAN) tablet 4 mg  4 mg Oral Q6H PRN Truett Mainland, DO       Or  . ondansetron [facility]) injection 4 mg  4 mg Intravenous Q6H PRN Truett Mainland, DO      . oxyCODONE (Oxy IR/ROXICODONE) immediate release tablet 10 mg  10 mg Oral Q6H PRN Manuella Ghazi, Pratik D, DO   10 mg at 07/26/17 0856  . pantoprazole (PROTONIX) EC tablet 40 mg  40 mg Oral Daily Truett Mainland, DO   40 mg at 07/26/17 0850  . traZODone (DESYREL) tablet 100 mg  100 mg Oral QHS Truett Mainland, DO   100 mg at 07/25/17 2130  . umeclidinium bromide (INCRUSE ELLIPTA) 62.5 MCG/INH 1 puff  1 puff Inhalation Daily Truett Mainland, DO   1 puff at 07/26/17 0802     Discharge Medications: Please see discharge summary for a list of discharge medications.  Relevant Imaging Results:  Relevant Lab Results:   Additional Information    Mcarthur Ivins, Clydene Pugh, LCSW

## 2017-07-26 NOTE — Care Management Important Message (Signed)
Important Message  Patient Details  Name: Dale Shields MRN: [id number] Date of Birth: 1950-09-07   Medicare Important Message Given:  Yes    [name] 07/26/2017, 11:51 AM

## 2017-07-26 NOTE — Clinical Social Work Note (Signed)
Facility notified, discharge clinicals sent.  LCSW signing off.   '[place], Clydene Pugh, LCSW

## 2017-07-26 NOTE — Progress Notes (Signed)
Vancomycin Tr drawn at 0619, level 21. Pharmacy contacted Vancomycin due at noon.

## 2017-07-26 NOTE — Progress Notes (Signed)
Patient IV removed, tolerated well. Report given Arbie Cookey at [facility].

## 2017-07-27 LAB — AEROBIC CULTURE W GRAM STAIN (SUPERFICIAL SPECIMEN)

## 2017-07-27 LAB — AEROBIC CULTURE  (SUPERFICIAL SPECIMEN)

## 2017-07-27 LAB — URINE CULTURE: Culture: NO GROWTH

## 2017-09-12 ENCOUNTER — Ambulatory Visit (INDEPENDENT_AMBULATORY_CARE_PROVIDER_SITE_OTHER): Payer: Medicare Other | Admitting: Internal Medicine

## 2017-09-12 ENCOUNTER — Encounter: Payer: Self-pay | Admitting: Internal Medicine

## 2017-09-12 VITALS — BP 126/74 | HR 63 | Ht 68.0 in | Wt 250.0 lb

## 2017-09-12 DIAGNOSIS — J449 Chronic obstructive pulmonary disease, unspecified: Secondary | ICD-10-CM | POA: Diagnosis not present

## 2017-09-12 DIAGNOSIS — J9612 Chronic respiratory failure with hypercapnia: Secondary | ICD-10-CM

## 2017-09-12 DIAGNOSIS — J9611 Chronic respiratory failure with hypoxia: Secondary | ICD-10-CM

## 2017-09-12 NOTE — Patient Instructions (Signed)
Please see patient coordinator before you leave today  to schedule for best fit for portable 02 including POC eval

## 2017-09-12 NOTE — Progress Notes (Signed)
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 but improved to point where only   minor gait issue /no sports then 2012/13  Became much less mobile due to multiple  orthopedic issues more so than by neuro problems then around 2016 became w/c dep due to shoulder and knee deterioration with sob attributed to copd since then to point where sob across the room/ 02 dep as well  S/p admit:    Admit date: 03/01/2016 Discharge date: 03/05/2016 Discharge Diagnoses:     Multiple sclerosis ([place])   COPD (chronic obstructive pulmonary disease) ([place])   CAD (coronary artery disease)   Hematuria   Generalized weakness   FTT (failure to thrive) in adult   Anemia of chronic disease   Depression   Anemia   NSTEMI (non-ST elevated myocardial infarction) ([place])   Gastric ulcer with hemorrhage Escherichia coli UTI     12/02/2016 1st [facility] office visit/ Dale Shields   Dx copd/ on advair and 02  Chief Complaint  Patient presents with  . Pulmonary Consult    Referred by Dr. Nadyne Coombes. Pt states that he was dxed with COPD years ago. He believes he was referred here today for pulmonary clearance for heart surgery. He states that any activity makes him feel SOB.  He has occ cough with minimal yellow sputum.     baseline doe x across the room off 02, a little further on 02 but then limited by ortho/weakness issues  Able to lie flat prefers  R side down but does ok on back on 2lpm  Hoarseness  comes and go assoc with minimal am cough better since quit smoking / no better sob or cough on saba Has sensation of globus x years (did not have prior to advair rx)  Needs to be cleared for gen anesthesia x 2 h for ablation by Dr Caryl Comes  rec Stop advair and start symbicort 80 Take 2 puffs first thing in am and then another 2 puffs about 12 hours later.  PFT's with MIP/ MEP next  available at [facility] and I will clear you for surgery then > minimal decrease in MIP/MEP but GOLD IV COPD criteria > cleared for surgery     02/06/2017  f/u ov/Dale Shields re: GOLD IV COPD/ very poor hfa Chief Complaint  Patient presents with  . Follow-up    Increased chest congestion and SOB ever since the last visit. He will occ cough up some minimal yellow sputum.     w/c bound  -  Able to walk across the room easier on 2lp -4 lpm (uses 4lpm after exerting) Gradually worse doe x 2 months but no sob at rest or noct Not clear he has hfa / tries to get around to using xopenex up to twice daily  Sleeps R side down/ 2lpm slt elevated  rec Plan A = Automatic = symbicort 160 Take 2 puffs first thing in am and spiriva 2.5 x 2 pffs each am  then another 2 puffs about 12 hours later   Plan B = Backup Only use your albuterol as a rescue medication Plan C = Crisis - only use your levoalbuterol nebulizer if you first try [employer] [place] to turn your 02 up to 4lpm with exertion/ otherwise keep at 2lpm 24/7     09/12/2017  f/u ov/Dale Shields re: copd GOLD IV / 02 dep x on symb 160 and incruse  Chief Complaint  Patient presents with  . Follow-up    Wants to discuss getting a POC.  He states his breathing is about the same since his last visit.    Dyspnea:  Across the room better on 02 2lpm and up to 4lpm when walking with walker up to 100 ft  Cough: none Sleep: most nocts on 2lpm  SABA use:  Rarely   No obvious day to day or daytime variability or assoc excess/ purulent sputum or mucus plugs or hemoptysis or cp or chest tightness, subjective wheeze or overt sinus or hb symptoms. No unusual exposure hx or h/o childhood pna/ asthma or knowledge of premature birth.  Sleeping  R side down 2lpm horizontal   without nocturnal  or early am exacerbation  of respiratory  c/o's or need for noct saba. Also denies any obvious fluctuation of symptoms with weather or environmental changes or other aggravating or alleviating factors  except as outlined above   Current Allergies, Complete Past Medical History, Past Surgical History, Family History, and Social History were reviewed in [employer] record.  ROS  The following are not active complaints unless bolded Hoarseness, sore throat, dysphagia, dental problems, itching, sneezing,  nasal congestion or discharge of excess mucus or purulent secretions, ear ache,   fever, chills, sweats, unintended wt loss or wt gain, classically pleuritic or exertional cp,  orthopnea pnd or arm/hand swelling  or leg swelling, presyncope, palpitations, abdominal pain, anorexia, nausea, vomiting, diarrhea  or change in bowel habits or change in bladder habits, change in stools or change in urine, dysuria, hematuria,  rash, arthralgias, visual complaints, headache, numbness, weakness or ataxia or problems with walking or coordination,  change in mood or  memory.        Current Meds  Medication Sig  . apixaban (ELIQUIS) 5 MG TABS tablet Take 5 mg by mouth 2 (two) times daily.  . Armodafinil 150 MG tablet Take 150 mg by mouth daily.   Marland Kitchen atorvastatin (LIPITOR) 10 MG tablet Take 10 mg by mouth every evening.   . budesonide-formoterol (SYMBICORT) 160-4.5 MCG/ACT inhaler Inhale 2 puffs into the lungs 2 (two) times daily.  . clonazePAM (KLONOPIN) 0.5 MG tablet Take 0.5 tablets (0.25 mg total) by mouth 2 (two) times daily.  Marland Kitchen diltiazem (CARDIZEM LA) 180 MG 24 hr tablet Take 180 mg by mouth daily.   . DULoxetine (CYMBALTA) 60 MG capsule TAKE (1) CAPSULE BY MOUTH TWICE DAILY.  . ergocalciferol (VITAMIN D2) 50000 units capsule Take 50,000 Units by mouth every Monday.   . ferrous sulfate 325 (65 FE) MG EC tablet Take 325 mg by mouth daily with breakfast.  . magnesium hydroxide (MILK OF MAGNESIA) 400 MG/5ML suspension Take 30 mLs by mouth daily as needed for mild constipation.  . methadone (DOLOPHINE) 10 MG tablet Take 2 tablets (20 mg total) by mouth every 12 (twelve) hours.  .  metoprolol tartrate (LOPRESSOR) 25 MG tablet Take 25 mg by mouth 2 (two) times daily.   . nitroGLYCERIN (NITROSTAT) 0.3 MG SL tablet Place 0.4 mg under the tongue every 5 (five) minutes as needed for chest pain.  Marland Kitchen omeprazole (PRILOSEC) 40 MG capsule Take 40 mg by mouth 2 (two) times daily.  . OXYGEN 2lpm with sleep and rest and 4-6 with exertion  . traZODone (DESYREL) 100 MG tablet Take 100 mg by mouth at bedtime.   Marland Kitchen umeclidinium  bromide (INCRUSE ELLIPTA) 62.5 MCG/INH AEPB Inhale 1 puff into the lungs daily.                     Objective:   Physical Exam       Motorized w/c nad    02/06/2017     Declined   12/02/16 235 lb (106.6 kg)  11/08/16 224 lb (101.6 kg)  10/13/16 230 lb (104.3 kg)    Vital signs reviewed  - Note on arrival 02 sats  95% on   2lpm    HEENT: nl   turbinates bilaterally, and oropharynx. Nl external ear canals without cough reflex      HEENT: nl   oropharynx. Nl external ear canals without cough reflex - moderate bilateral non-specific turbinate edema/ top  Dentures     NECK :  without JVD/Nodes/TM/ nl carotid upstrokes bilaterally   LUNGS: no acc muscle use,  Mod barrel  contour chest wall with bilateral  Distant bs s audible wheeze and  without cough on insp or exp maneuver and mod   Hyperresonant  to  percussion bilaterally     CV:  RRR  no s3 or murmur or increase in P2, and trace bilateral pedal edema   ABD:  soft and nontender with pos mid insp Hoover's   . No bruits or organomegaly appreciated, bowel sounds nl  MS:   ext warm without deformities, calf tenderness, cyanosis or clubbing No obvious joint restrictions   SKIN: warm and dry without lesions    NEURO:  alert, approp, nl sensorium with  no motor or cerebellar deficits apparent.    .    09/12/2017 no recent cxr's on file       Assessment:

## 2017-09-13 ENCOUNTER — Encounter: Payer: Self-pay | Admitting: Internal Medicine

## 2017-09-13 NOTE — Assessment & Plan Note (Signed)
Variably reports when quit smoking ?  11/2016 ? - 12/02/2016  try symb 80 2bid and off advair as has sense of globus / related to dpi ?  - PFT's   12/09/16   MIP/MEP = 74%/ 49% respectively    FEV1 0.98 (30 % ) ratio 29  p 12 % improvement from saba p ? prior to study with DLCO  29 % corrects to 46 % for alv volume  With air trapping and classic curvature   - 09/12/2017  After extensive coaching inhaler device  effectiveness =    75%   Despite suboptimal hfa relatively well compensated on present rx =  Group D in terms of symptom/risk and laba/lama/ICS  therefore appropriate rx at this point> continue symb 160/incruse  Formulary restrictions will be an ongoing challenge for the forseable future and I would be happy to pick an alternative if the pt will first  provide me a list of them but pt  will need to return here for training for any new device that is required eg dpi vs hfa vs respimat.    In meantime we can always provide samples so the patient never runs out of any needed respiratory medications.   I had an extended discussion with the patient reviewing all relevant studies completed to date and  lasting 15 to 20 minutes of a 25 minute visit    See device teaching which extended face to face time for this visit.  Each maintenance medication was reviewed in detail including emphasizing most importantly the difference between maintenance and prns and under what circumstances the prns are to be triggered using an action plan format that is not reflected in the computer generated alphabetically organized AVS which I have not found useful in most complex patients, especially with respiratory illnesses  Please see AVS for specific instructions unique to this visit that I personally wrote and verbalized to the the pt in detail and then reviewed with pt  by my nurse highlighting any  changes in therapy recommended at today's visit to their plan of care.

## 2017-09-13 NOTE — Assessment & Plan Note (Signed)
HC03   08/18/16   =  34  HCO3  07/26/17   = 32   Adequate control on present rx, reviewed in detail with pt > no change in rx needed = 2lpm 24/7 but increase to 4lpm with activity   Though somewhat paradoxic, when the lung fails to clear C02 properly and pC02 rises the lung then becomes a more efficient scavenger of C02 allowing lower work of breathing and  better C02 clearance albeit at a higher serum pC02 level - this is why pts can look a lot better than their ABG's would suggest and why it's so difficult to prognosticate endstage dz.  It's also why I strongly rec DNI status (ventilating pts down to a nl pC02 adversely affects this compensatory mechanism)   Will see if qualifies for different amb 02 rx > rec best fit eval by dme provider

## 2017-10-10 ENCOUNTER — Other Ambulatory Visit: Payer: Self-pay | Admitting: Urology

## 2017-10-23 ENCOUNTER — Other Ambulatory Visit: Payer: Self-pay

## 2017-10-23 ENCOUNTER — Encounter (HOSPITAL_COMMUNITY): Payer: Self-pay | Admitting: *Deleted

## 2017-10-23 NOTE — Progress Notes (Signed)
Lezlie Lye, LPN from [facility] called me back to let me know she received the Faxed pre-op instructions and verbalized understanding .

## 2017-10-23 NOTE — Progress Notes (Addendum)
Preop instructions PPI:RJJOAC Dirocco                         Date of Birth  :[birth year]                           Date of Procedure: 10/24/2017     Doctor: Dr. Lovena Neighbours  Time to arrive at [place] am  Report to: Admitting   Procedure:Cystoscopy, Supra-pubic tube placement with ultrasound guidance  Any procedure time changes, MD office will notify you!   Do not eat or drink past midnight the night before your procedure.(To include any tube feedings-must be discontinued)  Reminder:Follow bowel prep instructions per MD office! ( if applicable)   Take these morning medications only with sips of water.(or give through gastrostomy or feeding tube)  .Methadone,Metoprolol, Diltiazem, Omeprazole, Cymbalta, Abilify, Clonazepam, use Albuterol inhaler and Xopenex inhaler if needed and use Advair inhaler and bring inhalers with you to the hospital   Note: No Insulin or Diabetic meds should be given or taken the morning of the procedure!   Facility contact: Lezlie Lye, LPN                  Phone: [phone]                [place] POA:  Transportation contact phone#:  [facility] [employer]  Please send day of procedure:current med list and meds last taken that day, confirm nothing by mouth status from what time, Patient Demographic info( to include DNR status, problem list, allergies)   RN contact name/phone#:  Lezlie Lye, LPN [phone]       and Fax #: [phone]  Bring Insurance card and picture ID Leave all jewelry and other valuables at place where living( no metal or rings to be worn) No contact lens Women-no make-up, no lotions,perfumes,powders Men-no colognes,lotions  Any questions day of procedure,call Short Stay at [facility] unit-[phone]!   Sent from :[facility]                   [place]                   [phone]  Sent by :Charmaine Downs. Tobin Chad

## 2017-10-23 NOTE — H&P (Signed)
Urology Preoperative H&P   Chief Complaint: traumatic hypospadias   History of Present Illness: Dale Shields is a 67 y.o. male established patient who is here for bladder atony.  He does have a permanent indwelling Foley catheter.   He does not have an abnormal sensation when needing to urinate. He is not currently having trouble urinating. He is not having problems with urinary control or incontinence. Long history of multiple sclerosis and neurogenic bladder. He has required a chronic indwelling Foley catheter for some time now. He denies interval urinary tract infections or issues exchanging his catheter, which is done every month.  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 The patient cannot recollect a specific inciting event that caused the traumatic hypospadias. The patient reports dull constant pain and a mucous-like discharge associated with his traumatic hypospadias, but denies persistent bleeding or hematuria. He denies nausea/vomiting or fever/chills.      Past Medical History:  Diagnosis Date  . ADHD    per patient Hx from [facility]   . Anemia of chronic disease   . Anxiety   . Anxiety and depression   . BPH (benign prostatic hypertrophy)   . CAD (coronary artery disease) 11/2011   Stents x 3  . Carpal tunnel syndrome   . COPD (chronic obstructive pulmonary disease) ([place])   . DDD (degenerative disc disease)    knees, hips, shoulders--Dr. Ronnie [facility] prior to this)  . Depression   . Dyspnea   . Endocarditis 2012  . Exertional dyspnea   . Fibromyalgia   . GERD (gastroesophageal reflux disease)   . H/O hiatal hernia   . Heart murmur   . History of blood transfusion    "gave me too much coumadin and heparin once"  . History of epistaxis   . History of gout   . History of MRSA infection 2012   UTI, bacteremia, and endocarditis  . History of renal failure  2011   "on dialysis 3 times over 2 years"  . HLD (hyperlipidemia)   . Hydronephrosis   . Hypertension   . Hypothyroidism   . Insomnia   . Multiple sclerosis ([place])    with optic neuritis  . Myocardial infarction [facility])    "according to stress test; that's news to me"  . Narcolepsy   . Necrotizing fasciitis ([place])   . Neurogenic bladder    chronic indwelling foley catheter-changed monthly  . Optic neuritis   . OSA (obstructive sleep apnea)    "tried CPAP; lost more sleep w/use"  . Osteoarthritis of multiple joints    "everywhere"  . Peripheral vascular disease ([place])    per medical history from [facility] facility on 10/23/2017  . Pulmonary embolism ([place])    "suspected; never confirmed"--? treated with coumadin  . Scoliosis   . Steroid-induced psychosis   . Supraspinatus tendon tear 2012  . Thrombocytopenia ([place])     Past Surgical History:  Procedure Laterality Date  . CARDIOVERSION N/A 11/08/2016   Procedure: CARDIOVERSION;  Surgeon: Adrian Prows, MD;  Location: [place];  Service: Cardiovascular;  Laterality: N/A;  . CARPAL TUNNEL RELEASE  ~ 2010   left with radius fracture repair  . COLONOSCOPY  2000; 2005; 03/15/2011   diminutve adenoma--recall 2017  . CORONARY ANGIOPLASTY  11/22/2011   3 vessels  . CORONARY ANGIOPLASTY WITH STENT PLACEMENT  11/22/2011   DES to LAD  .  ESOPHAGOGASTRODUODENOSCOPY N/A 03/04/2016   Procedure: ESOPHAGOGASTRODUODENOSCOPY (EGD);  Surgeon: Manus Gunning, MD;  Location: [place];  Service: Gastroenterology;  Laterality: N/A;  . KNEE ARTHROSCOPY W/ MENISCAL REPAIR  1990's   right  . LEFT HEART CATHETERIZATION WITH CORONARY ANGIOGRAM N/A 11/22/2011   Procedure: LEFT HEART CATHETERIZATION WITH CORONARY ANGIOGRAM;  Surgeon: Laverda Page, MD;  Location: [facility] CATH LAB;  Service: Cardiovascular;  Laterality: N/A;  . nasal cautery    . PERCUTANEOUS CORONARY STENT INTERVENTION (PCI-S)  11/22/2011   Procedure: PERCUTANEOUS CORONARY  STENT INTERVENTION (PCI-S);  Surgeon: Laverda Page, MD;  Location: [facility] CATH LAB;  Service: Cardiovascular;;  . SKIN GRAFT  04/2009   right thigh to right "wrist all the way to my shoulder; necrotizing fasciitis    Allergies:  Allergies  Allergen Reactions  . Ultram [Tramadol Hcl] Other (See Comments)    Seizures; "grand mal; twice"  . Gabapentin Other (See Comments)    Made pt dilusional  . Lyrica [Pregabalin] Other (See Comments)    Leg swelling.    Family History  Problem Relation Age of Onset  . Alzheimer's disease Mother   . Colon cancer Neg Hx     Social History:  reports that he quit smoking about 10 months ago. His smoking use included cigarettes. He has a 40.00 pack-year smoking history. He has never used smokeless tobacco. He reports that he does not drink alcohol or use drugs.  ROS: A complete review of systems was performed.  All systems are negative except for pertinent findings as noted.  Physical Exam:  Vital signs in last 24 hours:   Constitutional:  Alert and oriented, No acute distress Cardiovascular: Regular rate and rhythm, No JVD Respiratory: Normal respiratory effort, Lungs clear bilaterally GI: Abdomen is soft, nontender, nondistended, no abdominal masses GU: No CVA tenderness Lymphatic: No lymphadenopathy Neurologic: Grossly intact, no focal deficits Psychiatric: Normal mood and affect  Laboratory Data:  No results for input(s): WBC, HGB, HCT, PLT in the last 72 hours.  No results for input(s): NA, K, CL, GLUCOSE, BUN, CALCIUM, CREATININE in the last 72 hours.  Invalid input(s): CO3   No results found for this or any previous visit (from the past 24 hour(s)). No results found for this or any previous visit (from the past 240 hour(s)).  Renal Function: No results for input(s): CREATININE in the last 168 hours. CrCl cannot be calculated (Patient's most recent lab result is older than the maximum 21 days allowed.).  Radiologic Imaging: No  results found.  I independently reviewed the above imaging studies.  Assessment and Plan Dale Shields is a 67 y.o. 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 Risks include, but aren't limited to: Bleeding, infection of the wound and the urinary tract, intestinal injury, leakage per urethra, leakage around the suprapubic catheter, pain associated with the suprapubic catheter, MI, CVA, PE, DVT and the inherent risk of general anesthesia. He voices understanding and wishes to proceed. He'll need cardiopulmonary clearance prior to the procedure as the patient is on Eliquis for atrial fibrillation and uses supplemental oxygen secondary to COPD.   Ellison Hughs, MD 10/23/2017, 2:21 PM  [facility] Pager: [phone]

## 2017-10-23 NOTE — Progress Notes (Signed)
Faxed pre-op instructions to Elaina Hoops, LPN at [facility].

## 2017-10-24 ENCOUNTER — Ambulatory Visit (HOSPITAL_COMMUNITY): Payer: Medicare Other | Admitting: Anesthesiology

## 2017-10-24 ENCOUNTER — Telehealth (HOSPITAL_COMMUNITY): Payer: Self-pay | Admitting: *Deleted

## 2017-10-24 ENCOUNTER — Encounter (HOSPITAL_COMMUNITY)
Admission: RE | Disposition: A | Discharge status: Skilled Nursing Facility | Payer: Self-pay | Source: Ambulatory Visit | Attending: Urology

## 2017-10-24 ENCOUNTER — Ambulatory Visit (HOSPITAL_COMMUNITY)
Admission: RE | Admit: 2017-10-24 | Discharge: 2017-10-24 | Disposition: A | Discharge status: Skilled Nursing Facility | Payer: Medicare Other | Source: Ambulatory Visit | Attending: Urology | Admitting: Urology

## 2017-10-24 ENCOUNTER — Encounter (HOSPITAL_COMMUNITY): Payer: Self-pay | Admitting: Emergency Medicine

## 2017-10-24 ENCOUNTER — Other Ambulatory Visit: Payer: Self-pay

## 2017-10-24 DIAGNOSIS — N319 Neuromuscular dysfunction of bladder, unspecified: Secondary | ICD-10-CM | POA: Diagnosis present

## 2017-10-24 DIAGNOSIS — Z6837 Body mass index (BMI) 37.0-37.9, adult: Secondary | ICD-10-CM | POA: Insufficient documentation

## 2017-10-24 DIAGNOSIS — I10 Essential (primary) hypertension: Secondary | ICD-10-CM | POA: Diagnosis not present

## 2017-10-24 DIAGNOSIS — I2510 Atherosclerotic heart disease of native coronary artery without angina pectoris: Secondary | ICD-10-CM | POA: Insufficient documentation

## 2017-10-24 DIAGNOSIS — J449 Chronic obstructive pulmonary disease, unspecified: Secondary | ICD-10-CM | POA: Diagnosis not present

## 2017-10-24 DIAGNOSIS — Z87891 Personal history of nicotine dependence: Secondary | ICD-10-CM | POA: Insufficient documentation

## 2017-10-24 DIAGNOSIS — I4891 Unspecified atrial fibrillation: Secondary | ICD-10-CM | POA: Diagnosis not present

## 2017-10-24 DIAGNOSIS — F419 Anxiety disorder, unspecified: Secondary | ICD-10-CM | POA: Diagnosis not present

## 2017-10-24 DIAGNOSIS — Z79899 Other long term (current) drug therapy: Secondary | ICD-10-CM | POA: Insufficient documentation

## 2017-10-24 DIAGNOSIS — G35 Multiple sclerosis: Secondary | ICD-10-CM | POA: Insufficient documentation

## 2017-10-24 DIAGNOSIS — Q549 Hypospadias, unspecified: Secondary | ICD-10-CM | POA: Diagnosis not present

## 2017-10-24 DIAGNOSIS — E039 Hypothyroidism, unspecified: Secondary | ICD-10-CM | POA: Insufficient documentation

## 2017-10-24 HISTORY — PX: OPERATIVE ULTRASOUND: SHX5996

## 2017-10-24 HISTORY — PX: INSERTION OF SUPRAPUBIC CATHETER: SHX5870

## 2017-10-24 HISTORY — DX: Attention-deficit hyperactivity disorder, unspecified type: F90.9

## 2017-10-24 HISTORY — DX: Peripheral vascular disease, unspecified: I73.9

## 2017-10-24 LAB — CBC
HCT: 34 % — ABNORMAL LOW (ref 39.0–52.0)
HEMOGLOBIN: 10.1 g/dL — AB (ref 13.0–17.0)
MCH: 30.1 pg (ref 26.0–34.0)
MCHC: 29.7 g/dL — ABNORMAL LOW (ref 30.0–36.0)
MCV: 101.2 fL — ABNORMAL HIGH (ref 78.0–100.0)
Platelets: 294 10*3/uL (ref 150–400)
RBC: 3.36 MIL/uL — AB (ref 4.22–5.81)
RDW: 17 % — ABNORMAL HIGH (ref 11.5–15.5)
WBC: 8.9 10*3/uL (ref 4.0–10.5)

## 2017-10-24 LAB — SURGICAL PCR SCREEN
MRSA, PCR: POSITIVE — AB
STAPHYLOCOCCUS AUREUS: POSITIVE — AB

## 2017-10-24 LAB — BASIC METABOLIC PANEL
Anion gap: 11 (ref 5–15)
BUN: 20 mg/dL (ref 8–23)
CHLORIDE: 94 mmol/L — AB (ref 98–111)
CO2: 36 mmol/L — ABNORMAL HIGH (ref 22–32)
Calcium: 9.2 mg/dL (ref 8.9–10.3)
Creatinine, Ser: 1.48 mg/dL — ABNORMAL HIGH (ref 0.61–1.24)
GFR calc non Af Amer: 48 mL/min — ABNORMAL LOW (ref >60)
GFR, EST AFRICAN AMERICAN: 56 mL/min — AB (ref >60)
Glucose, Bld: 111 mg/dL — ABNORMAL HIGH (ref 70–99)
POTASSIUM: 4 mmol/L (ref 3.5–5.1)
SODIUM: 141 mmol/L (ref 135–145)

## 2017-10-24 SURGERY — INSERTION, SUPRAPUBIC CATHETER
Anesthesia: General | Site: Abdomen

## 2017-10-24 MED ORDER — SODIUM CHLORIDE 0.9 % IV SOLN
INTRAVENOUS | Status: DC | PRN
  Administered 2017-10-24: 120 ug via INTRAVENOUS

## 2017-10-24 MED ORDER — SODIUM CHLORIDE 0.9 % IR SOLN
Status: DC | PRN
  Administered 2017-10-24: 3000 mL via INTRAVESICAL

## 2017-10-24 MED ORDER — ONDANSETRON HCL 4 MG/2ML IJ SOLN
INTRAMUSCULAR | Status: AC
  Filled 2017-10-24: qty 2

## 2017-10-24 MED ORDER — MEPERIDINE HCL 50 MG/ML IJ SOLN: 6.2500 mg | INTRAMUSCULAR | Status: DC | PRN

## 2017-10-24 MED ORDER — DEXAMETHASONE SODIUM PHOSPHATE 10 MG/ML IJ SOLN
INTRAMUSCULAR | Status: DC | PRN
  Administered 2017-10-24: 5 mg via INTRAVENOUS

## 2017-10-24 MED ORDER — BUPIVACAINE-EPINEPHRINE (PF) 0.25% -1:200000 IJ SOLN
INTRAMUSCULAR | Status: DC | PRN
  Administered 2017-10-24: 10 mL

## 2017-10-24 MED ORDER — DEXAMETHASONE SODIUM PHOSPHATE 10 MG/ML IJ SOLN
INTRAMUSCULAR | Status: AC
  Filled 2017-10-24: qty 1

## 2017-10-24 MED ORDER — HYDROMORPHONE HCL 1 MG/ML IJ SOLN: 0.2500 mg | INTRAMUSCULAR | Status: DC | PRN

## 2017-10-24 MED ORDER — 0.9 % SODIUM CHLORIDE (POUR BTL) OPTIME
TOPICAL | Status: DC | PRN
  Administered 2017-10-24: 1000 mL

## 2017-10-24 MED ORDER — HYDROCODONE-ACETAMINOPHEN 7.5-325 MG PO TABS: 1.0000 | ORAL_TABLET | Freq: Once | ORAL | Status: DC | PRN

## 2017-10-24 MED ORDER — EPHEDRINE SULFATE 50 MG/ML IJ SOLN
INTRAMUSCULAR | Status: DC | PRN
  Administered 2017-10-24 (×4): 10 mg via INTRAVENOUS

## 2017-10-24 MED ORDER — LIDOCAINE 2% (20 MG/ML) 5 ML SYRINGE
INTRAMUSCULAR | Status: DC | PRN
  Administered 2017-10-24: 100 mg via INTRAVENOUS

## 2017-10-24 MED ORDER — OXYBUTYNIN CHLORIDE 5 MG PO TABS: 5.0000 mg | ORAL_TABLET | Freq: Three times a day (TID) | ORAL | 1 refills | Status: AC | PRN

## 2017-10-24 MED ORDER — CEFAZOLIN SODIUM-DEXTROSE 2-4 GM/100ML-% IV SOLN
2.0000 g | Freq: Once | INTRAVENOUS | Status: AC
  Administered 2017-10-24: 2 g via INTRAVENOUS
  Filled 2017-10-24: qty 100

## 2017-10-24 MED ORDER — PROPOFOL 10 MG/ML IV BOLUS
INTRAVENOUS | Status: DC | PRN
  Administered 2017-10-24: 30 mg via INTRAVENOUS
  Administered 2017-10-24: 200 mg via INTRAVENOUS

## 2017-10-24 MED ORDER — LIDOCAINE 2% (20 MG/ML) 5 ML SYRINGE
INTRAMUSCULAR | Status: AC
  Filled 2017-10-24: qty 5

## 2017-10-24 MED ORDER — PHENYLEPHRINE 40 MCG/ML (10ML) SYRINGE FOR IV PUSH (FOR BLOOD PRESSURE SUPPORT)
PREFILLED_SYRINGE | INTRAVENOUS | Status: AC
  Filled 2017-10-24: qty 10

## 2017-10-24 MED ORDER — ACETAMINOPHEN 10 MG/ML IV SOLN: 1000.0000 mg | Freq: Once | INTRAVENOUS | Status: DC | PRN

## 2017-10-24 MED ORDER — PROPOFOL 10 MG/ML IV BOLUS
INTRAVENOUS | Status: AC
  Filled 2017-10-24: qty 20

## 2017-10-24 MED ORDER — ONDANSETRON HCL 4 MG/2ML IJ SOLN: 4.0000 mg | Freq: Once | INTRAMUSCULAR | Status: DC | PRN

## 2017-10-24 MED ORDER — ONDANSETRON HCL 4 MG/2ML IJ SOLN
INTRAMUSCULAR | Status: DC | PRN
  Administered 2017-10-24: 4 mg via INTRAVENOUS

## 2017-10-24 MED ORDER — HYDROCODONE-ACETAMINOPHEN 5-325 MG PO TABS: 1.0000 | ORAL_TABLET | ORAL | 0 refills | Status: AC | PRN

## 2017-10-24 MED ORDER — EPHEDRINE 5 MG/ML INJ
INTRAVENOUS | Status: AC
  Filled 2017-10-24: qty 10

## 2017-10-24 MED ORDER — FENTANYL CITRATE (PF) 100 MCG/2ML IJ SOLN
INTRAMUSCULAR | Status: AC
Start: 2017-10-24 — End: ?
  Filled 2017-10-24: qty 2

## 2017-10-24 MED ORDER — ONDANSETRON HCL 4 MG PO TABS: 4.0000 mg | ORAL_TABLET | Freq: Every day | ORAL | 1 refills | Status: AC | PRN

## 2017-10-24 MED ORDER — LACTATED RINGERS IV SOLN
INTRAVENOUS | Status: DC
Start: 2017-10-24 — End: 2017-10-24
  Administered 2017-10-24: 08:00:00 via INTRAVENOUS

## 2017-10-24 MED ORDER — BUPIVACAINE-EPINEPHRINE (PF) 0.25% -1:200000 IJ SOLN
INTRAMUSCULAR | Status: AC
  Filled 2017-10-24: qty 30

## 2017-10-24 SURGICAL SUPPLY — 27 items
BAG URINE DRAINAGE (UROLOGICAL SUPPLIES) ×3 IMPLANT
BAG URINE LEG 500ML (DRAIN) ×3 IMPLANT
BLADE SURG 15 STRL LF DISP TIS (BLADE) ×1 IMPLANT
BLADE SURG 15 STRL SS (BLADE) ×2
COUNTER NEEDLE 20 DBL MAG RED (NEEDLE) ×3 IMPLANT
COVER PROBE U/S 5X48 (MISCELLANEOUS) ×6 IMPLANT
COVER SURGICAL LIGHT HANDLE (MISCELLANEOUS) ×3 IMPLANT
ELECT PENCIL ROCKER SW 15FT (MISCELLANEOUS) ×3 IMPLANT
ELECT REM PT RETURN 15FT ADLT (MISCELLANEOUS) ×3 IMPLANT
GAUZE 4X4 16PLY RFD (DISPOSABLE) ×3 IMPLANT
GAUZE SPONGE 4X4 12PLY STRL (GAUZE/BANDAGES/DRESSINGS) ×3 IMPLANT
GLOVE BIOGEL M STRL SZ7.5 (GLOVE) ×3 IMPLANT
GOWN STRL REUS W/TWL XL LVL3 (GOWN DISPOSABLE) ×6 IMPLANT
MANIFOLD NEPTUNE II (INSTRUMENTS) ×3 IMPLANT
NEEDLE HYPO 22GX1.5 SAFETY (NEEDLE) IMPLANT
NS IRRIG 1000ML POUR BTL (IV SOLUTION) ×3 IMPLANT
PACK CYSTO (CUSTOM PROCEDURE TRAY) ×3 IMPLANT
PLUG CATH AND CAP STER (CATHETERS) ×3 IMPLANT
SUT SILK 2 0 30  PSL (SUTURE) ×2
SUT SILK 2 0 30 PSL (SUTURE) ×1 IMPLANT
SUT SILK 2 0 SH (SUTURE) ×3 IMPLANT
SYR 10ML LL (SYRINGE) ×3 IMPLANT
SYR 20CC LL (SYRINGE) ×3 IMPLANT
SYR 30ML LL (SYRINGE) ×3 IMPLANT
TAPE CLOTH SURG 4X10 WHT LF (GAUZE/BANDAGES/DRESSINGS) ×3 IMPLANT
TOWEL OR 17X26 10 PK STRL BLUE (TOWEL DISPOSABLE) ×3 IMPLANT
WATER STERILE IRR 3000ML UROMA (IV SOLUTION) ×3 IMPLANT

## 2017-10-24 NOTE — Anesthesia Postprocedure Evaluation (Signed)
Anesthesia Post Note  Patient: Dale Shields  Procedure(s) Performed: CYSTOSCOPY WITH INSERTION OF SUPRAPUBIC CATHETER (N/A Abdomen) OPERATIVE ULTRASOUND (N/A Abdomen)     Patient location during evaluation: PACU Anesthesia Type: General Level of consciousness: sedated and patient cooperative Pain management: pain level controlled Vital Signs Assessment: post-procedure vital signs reviewed and stable Respiratory status: spontaneous breathing Cardiovascular status: stable Anesthetic complications: no    Last Vitals:  Vitals:   10/24/17 1115 10/24/17 1145  BP: (!) 144/69 (!) 148/70  Pulse: 67 70  Resp: 14 18  Temp: 36.6 C 36.4 C  SpO2: 95% 96%    Last Pain:  Vitals:   10/24/17 1115  TempSrc:   PainSc: 0-No pain                 Nolon Nations

## 2017-10-24 NOTE — Progress Notes (Signed)
Pharmacy tech reviewed pt medication via phone with nursing home. Last dose of metoprolol was yesterday, was not given today. Dr. Valma Cava informed and gave instruction NOT to give prior to surgery.

## 2017-10-24 NOTE — Anesthesia Procedure Notes (Signed)
Procedure Name: LMA Insertion Date/Time: 10/24/2017 10:00 AM Performed by: Lind Covert, CRNA Pre-anesthesia Checklist: Patient identified, Emergency Drugs available, Suction available, Patient being monitored and Timeout performed Patient Re-evaluated:Patient Re-evaluated prior to induction Oxygen Delivery Method: Circle system utilized Preoxygenation: Pre-oxygenation with 100% oxygen Induction Type: IV induction Ventilation: Mask ventilation with difficulty LMA: LMA inserted LMA Size: 5.0 Number of attempts: 1 Dental Injury: Teeth and Oropharynx as per pre-operative assessment

## 2017-10-24 NOTE — Transfer of Care (Signed)
Immediate Anesthesia Transfer of Care Note  Patient: Dale Shields  Procedure(s) Performed: CYSTOSCOPY WITH INSERTION OF SUPRAPUBIC CATHETER (N/A Abdomen) OPERATIVE ULTRASOUND (N/A Abdomen)  Patient Location: PACU  Anesthesia Type:General  Level of Consciousness: sedated  Airway & Oxygen Therapy: Patient Spontanous Breathing and Patient connected to nasal cannula oxygen  Post-op Assessment: Report given to RN and Post -op Vital signs reviewed and stable  Post vital signs: Reviewed and stable  Last Vitals:  Vitals Value Taken Time  BP 166/76 10/24/2017 10:55 AM  Temp    Pulse 72 10/24/2017 10:56 AM  Resp 12 10/24/2017 10:56 AM  SpO2 99 % 10/24/2017 10:56 AM  Vitals shown include unvalidated device data.  Last Pain:  Vitals:   10/24/17 0822  TempSrc: Oral  PainSc:       Patients Stated Pain Goal: 4 (35/82/51 8984)  Complications: No apparent anesthesia complications

## 2017-10-24 NOTE — Anesthesia Preprocedure Evaluation (Addendum)
Anesthesia Evaluation  Patient identified by MRN, date of birth, ID band Patient awake    Reviewed: Allergy & Precautions, NPO status , Patient's Chart, lab work & pertinent test results  Airway Mallampati: II  TM Distance: >3 FB Neck ROM: Full    Dental no notable dental hx. (+) Teeth Intact, Caps   Pulmonary shortness of breath, sleep apnea and Continuous Positive Airway Pressure Ventilation , COPD, former smoker,    Pulmonary exam normal breath sounds clear to auscultation       Cardiovascular hypertension, + CAD, + Past MI and + Peripheral Vascular Disease  Normal cardiovascular exam+ dysrhythmias Atrial Fibrillation  Rhythm:Regular Rate:Normal  Echo 4/26/ 18 EF 50% wilt mild to mod MR and Mild TR   Neuro/Psych Anxiety    GI/Hepatic GERD  ,  Endo/Other  Hypothyroidism Morbid obesity  Renal/GU Renal disease     Musculoskeletal  (+) Fibromyalgia -  Abdominal (+) + obese,   Peds  Hematology  (+) anemia ,   Anesthesia Other Findings   Reproductive/Obstetrics                            Lab Results  Component Value Date   CREATININE 1.09 07/26/2017   BUN 20 07/26/2017   NA 140 07/26/2017   K 4.4 07/26/2017   CL 98 (L) 07/26/2017   CO2 32 07/26/2017    Lab Results  Component Value Date   WBC 10.0 07/26/2017   HGB 8.6 (L) 07/26/2017   HCT 30.1 (L) 07/26/2017   MCV 98.4 07/26/2017   PLT 236 07/26/2017    Anesthesia Physical Anesthesia Plan  ASA: III  Anesthesia Plan: General   Post-op Pain Management:    Induction: Intravenous  PONV Risk Score and Plan: 2 and Treatment may vary due to age or medical condition, Ondansetron and Dexamethasone  Airway Management Planned: LMA  Additional Equipment:   Intra-op Plan:   Post-operative Plan:   Informed Consent: I have reviewed the patients History and Physical, chart, labs and discussed the procedure including the risks,  benefits and alternatives for the proposed anesthesia with the patient or authorized representative who has indicated his/her understanding and acceptance.   Dental advisory given  Plan Discussed with: CRNA  Anesthesia Plan Comments:        Anesthesia Quick Evaluation

## 2017-10-25 ENCOUNTER — Encounter (HOSPITAL_COMMUNITY): Payer: Self-pay | Admitting: Urology

## 2017-10-27 NOTE — Op Note (Signed)
Operative Note  Preoperative diagnosis:  1.  Traumatic hypospadias 2.  Neurogenic bladder requiring indwelling Foley catheter 3.  History of multiple sclerosis  Postoperative diagnosis: 1.  Traumatic hypospadias 2.  Neurogenic bladder requiring indwelling Foley catheter 3.  History of multiple sclerosis  Procedure(s): 1.  Cystoscopy 2.  Suprapubic catheter placement  Surgeon: Ellison Hughs, MD  Assistants: None  Anesthesia: General  Complications: None  EBL: Less than 5 mL  Specimens: 1.  None  Drains/Catheters: 1.  18 French suprapubic catheter  Intraoperative findings:   1. 18 French suprapubic catheter placed at the anterior bladder dome with no apparent bleeding following placement.  The catheter was then secured to the skin with a silk stitch and was draining clear irrigant at the conclusion of the case 2. Normal capacity bladder with no intravesical lesions  Indication:  Dale Shields is a 67 y.o. male with a history of a neurogenic bladder secondary to multiple sclerosis requiring a chronic indwelling urethral catheter.  He is developed a traumatic hypospadias after years of having a Foley in place.  He would like to proceed with suprapubic catheter placement to hopefully avert any further urethral injury.  The risk, benefits and alternatives of the above procedures was discussed with the patient.  He voices understanding wishes to proceed.  Description of procedure:  After informed consent was obtained, the patient was brought to the operating room and general LMA anesthesia was administered. The patient was then placed in the dorsolithotomy position and prepped and draped in usual sterile fashion. A timeout was performed. A 23 French rigid cystoscope was then inserted into the urethral meatus, which was retracted approximately 3 cm from the tip of the glans due to the traumatic hypospadias, and advanced into the bladder under direct vision. A complete bladder  survey revealed no intravesical pathology.  The bladder was then filled with approximately 500 mL of sterile water and was palpable above the pubic symphysis.  Transabdominal ultrasound was then performed to assure that there was no bowel between the anterior abdominal wall and the anterior surface of the bladder, which there was none.  A Lowsley retractor was then placed into the urethra and up into the bladder and could be easily palpated above the pubic symphysis.  A 2 cm incision was then made approximately 2 fingerbreadths above the pubic symphysis and was carried down until the Lowsley retractor protruded through the incision.  An 21 French catheter was then secured to the Lowsley retractor and placed within the bladder.  A total of 10 mL of sterile water was placed in the catheter balloon.  Repeat cystoscopy showed excellent placement of the suprapubic catheter at the anterior bladder dome with no bladder neck involvement or signs of undue detrusor trauma.  The suprapubic catheter was then placed to gravity drainage.  The patient tolerated the procedure well and was transferred to the postanesthesia in stable condition.  Plan: Follow-up in 1 month for suprapubic catheter exchange

## 2018-01-29 ENCOUNTER — Telehealth: Payer: Self-pay

## 2018-01-29 NOTE — Telephone Encounter (Signed)
T/C from [facility] @ [address] calling to check on status of  referral on this pt. She is aware that Manuela Schwartz has left for the day and I will have her check on it tomorrow.  Carol's call back number is [phone].

## 2018-01-30 NOTE — Telephone Encounter (Signed)
Noted  

## 2018-01-30 NOTE — Telephone Encounter (Signed)
Referral was denied, Pt of [facility]. [place]/Carol are aware

## 2018-02-14 ENCOUNTER — Telehealth: Payer: Self-pay | Admitting: Internal Medicine

## 2018-02-20 NOTE — Telephone Encounter (Signed)
Requested to see patient for "low hemoglobin"  Before I can properly evaluate the patient I need to see:  1) All labs in last 6 months 2) Progress notes from the nursing home  3) medication list  Once we have received these we will schedule the patient for an evaluatyion in the office with me or an APP  Please notify the nursing home

## 2018-03-12 IMAGING — DX DG CHEST 2V
2 series · 2 of 2 positions shown · non-contrast
Comparison: 04/06/2015

CLINICAL DATA: Chest pain and increasing shortness of Breath

EXAM:
CHEST  2 VIEW

[chest pa]
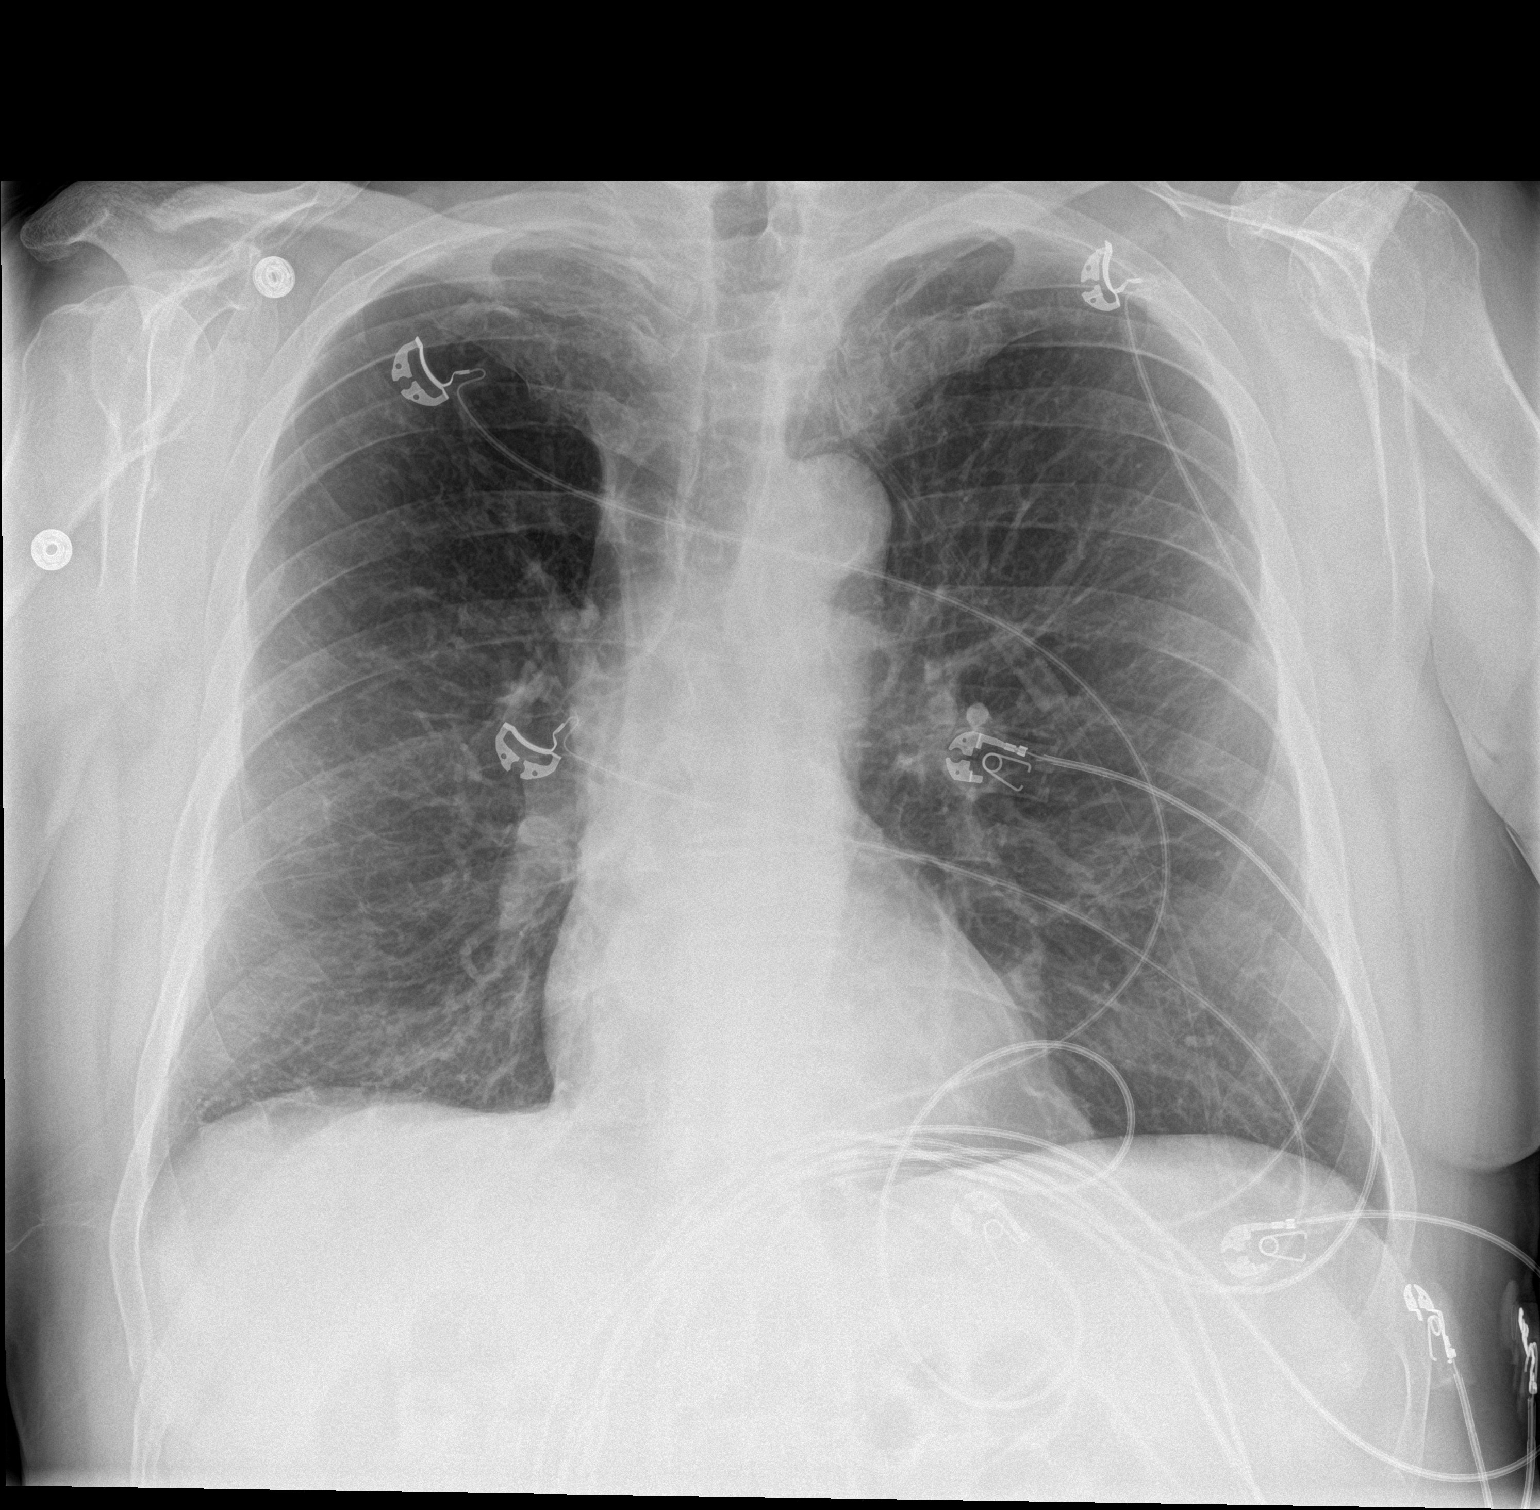

[chest lat]
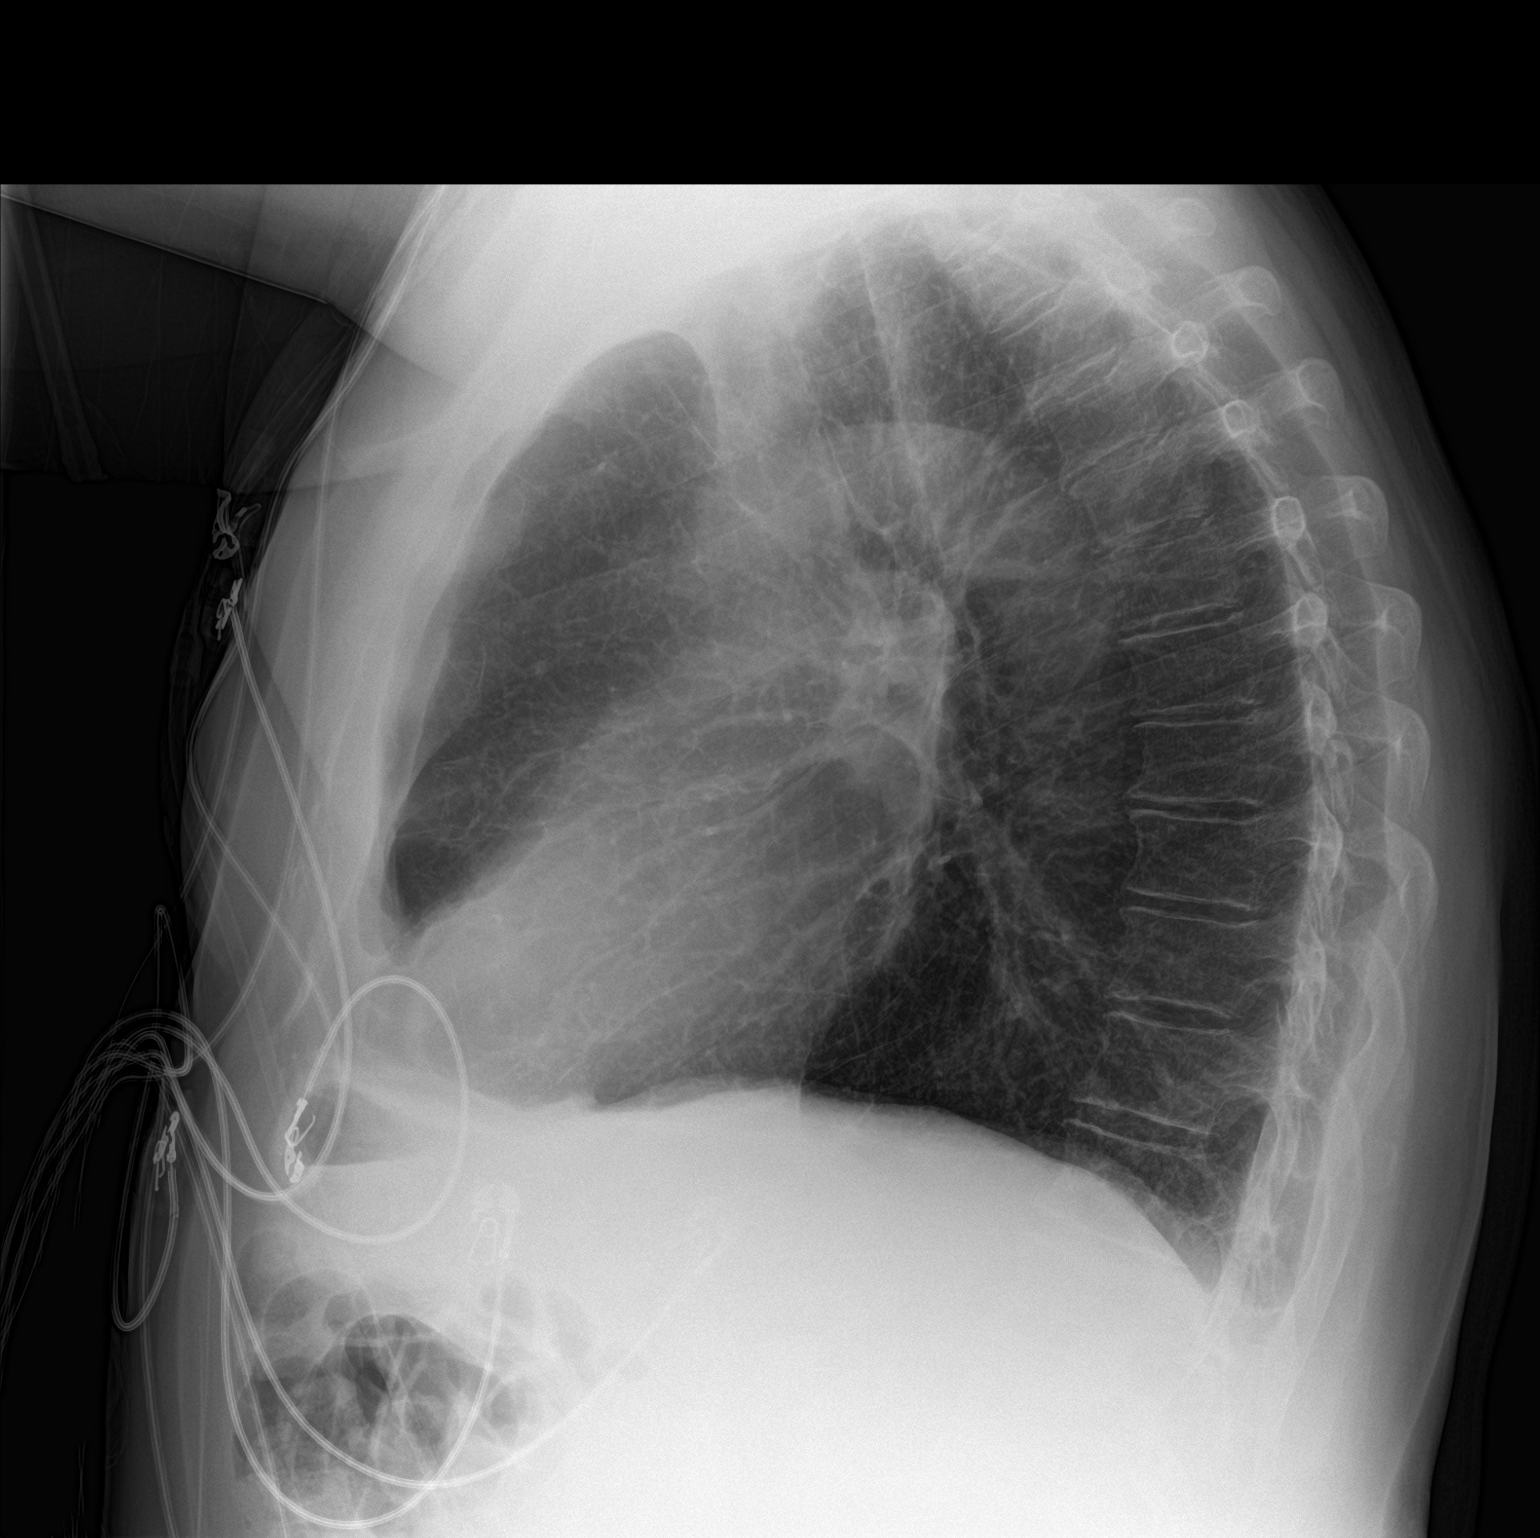

[2 of 2 positions shown; findings below may reference images not displayed]

FINDINGS: Cardiac shadow is within normal limits. The lungs are well aerated
bilaterally. No focal infiltrate or sizable effusion is seen. No
acute bony abnormality is noted.
IMPRESSION: No active cardiopulmonary disease.

## 2018-03-12 NOTE — Telephone Encounter (Signed)
Spoke w/Carol from [facility] and she said she would fax the info that Dr. Carlean Purl is requesting

## 2018-03-23 ENCOUNTER — Telehealth: Payer: Self-pay | Admitting: Internal Medicine

## 2018-03-23 NOTE — Telephone Encounter (Signed)
Records Dr Carlean Purl had requested were received. Placed on Dr Celesta Aver desk for review

## 2018-03-27 IMAGING — CT CT ABD-PELV W/O CM
2 of 4 series · 16 of 46 positions shown, 18 images · non-contrast
Comparison: [DATE]

CLINICAL DATA: Abdominal pain, patient states it feels like battery
acid in his stomach, history fibromyalgia, multiple sclerosis,
pulmonary embolism, COPD, coronary artery disease post MI and
coronary PTCA, hiatal hernia, GERD

EXAM:
CT ABDOMEN AND PELVIS WITHOUT CONTRAST
TECHNIQUE: Multidetector CT imaging of the abdomen and pelvis was performed
following the standard protocol without IV contrast. Sagittal and
coronal MPR images reconstructed from axial data set. Patient
refused oral contrast.

[Series 2: axial st · axial · 0.87mm/px · z∈[-651,-241]mm · 13 of 90 slices shown, 15 images]
[im 4/90  soft-tissue]
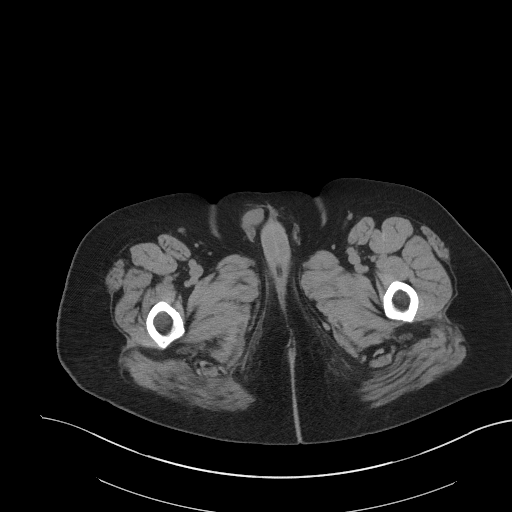
[im 4/90  bone]
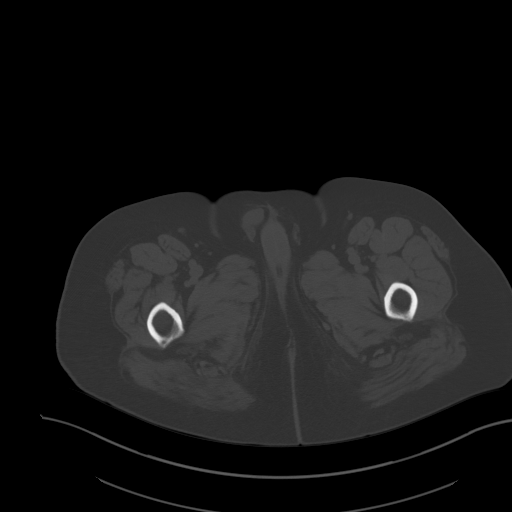
[im 12/90  soft-tissue]
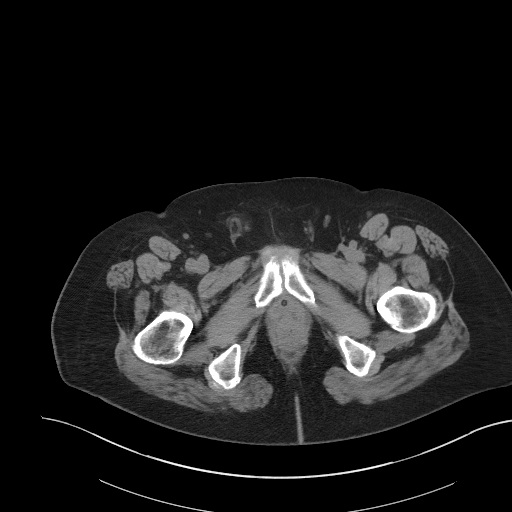
[im 19/90  soft-tissue]
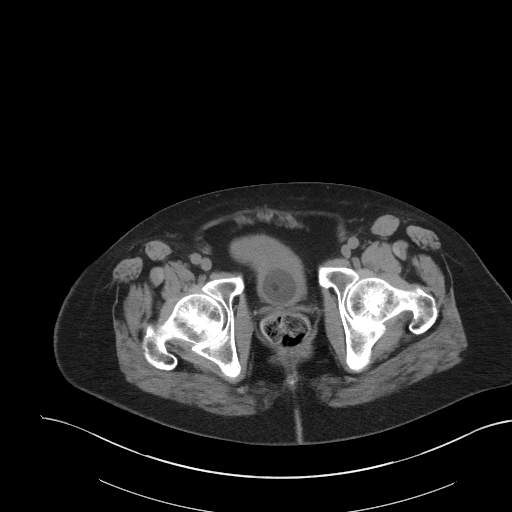
[im 26/90  soft-tissue]
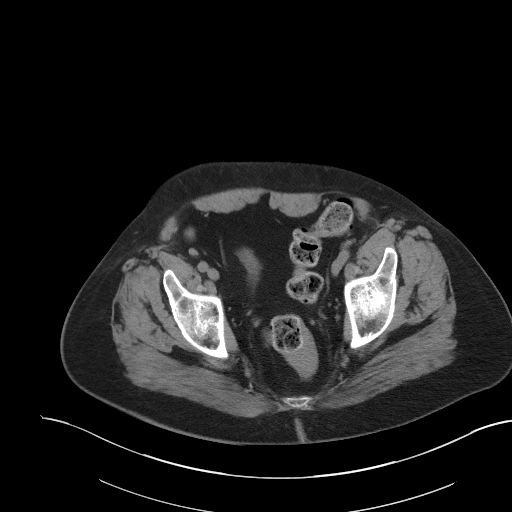
[im 30/90  soft-tissue]
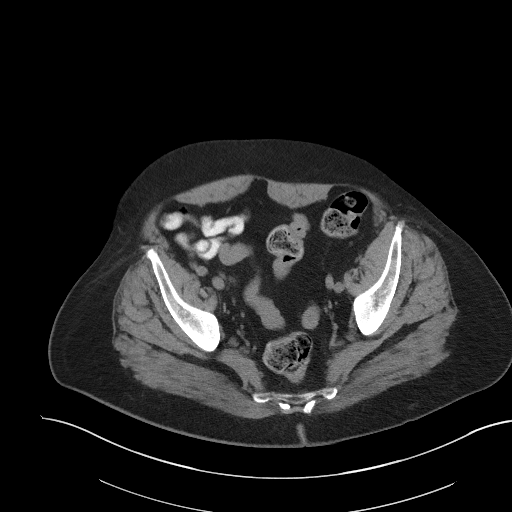
[im 38/90  soft-tissue]
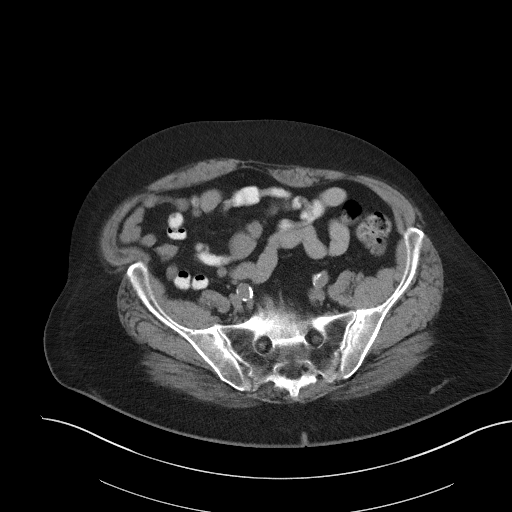
[im 45/90  soft-tissue]
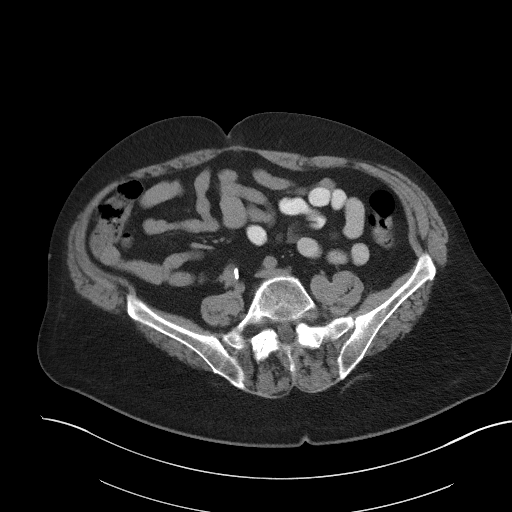
[im 52/90  soft-tissue]
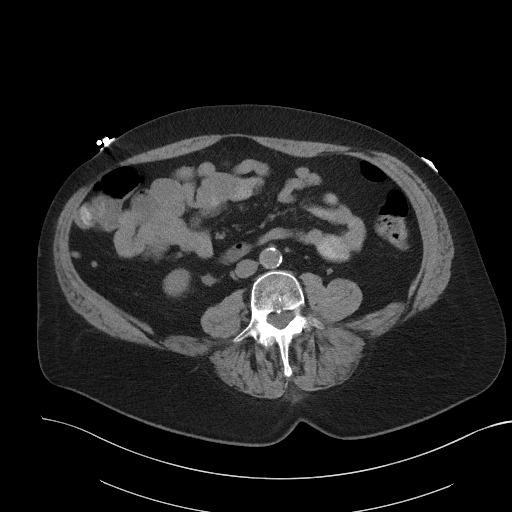
[im 60/90  soft-tissue]
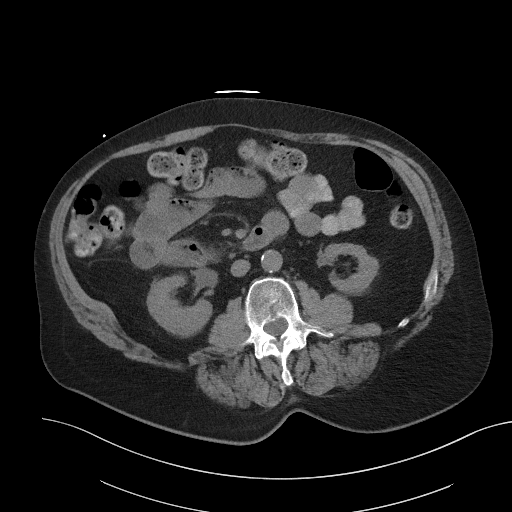
[im 60/90  bone]
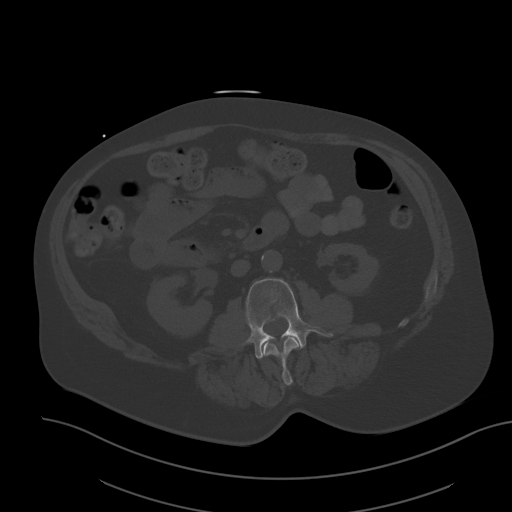
[im 64/90  soft-tissue]
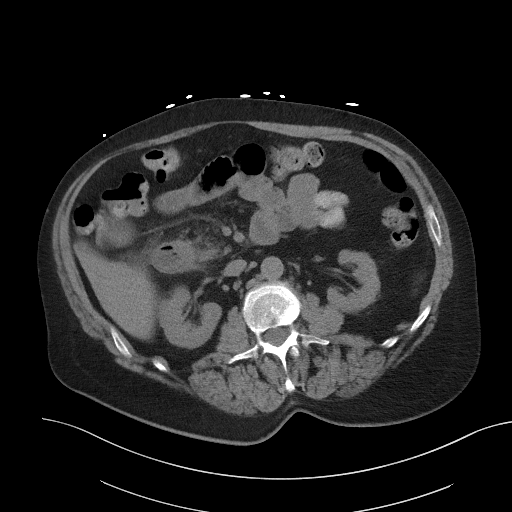
[im 71/90  soft-tissue]
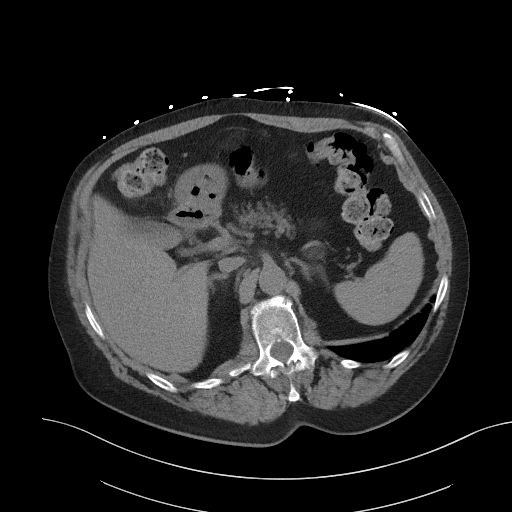
[im 78/90  soft-tissue]
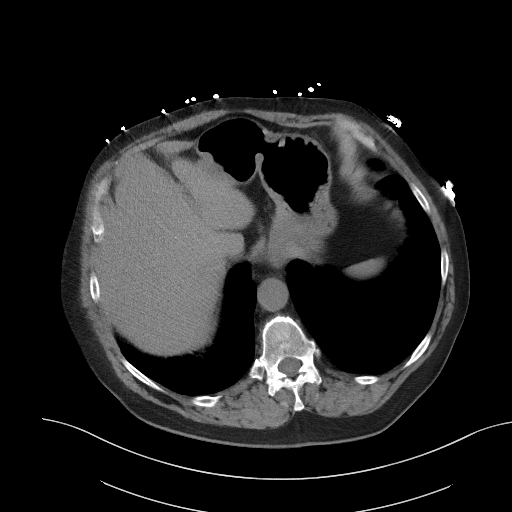
[im 86/90  soft-tissue]
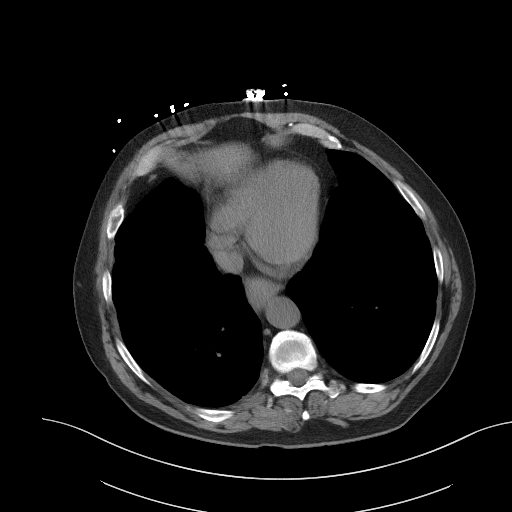

[Series 5: coronal st · coronal · 0.77mm/px · 3 of 104 slices shown]
[im 35/104  soft-tissue]
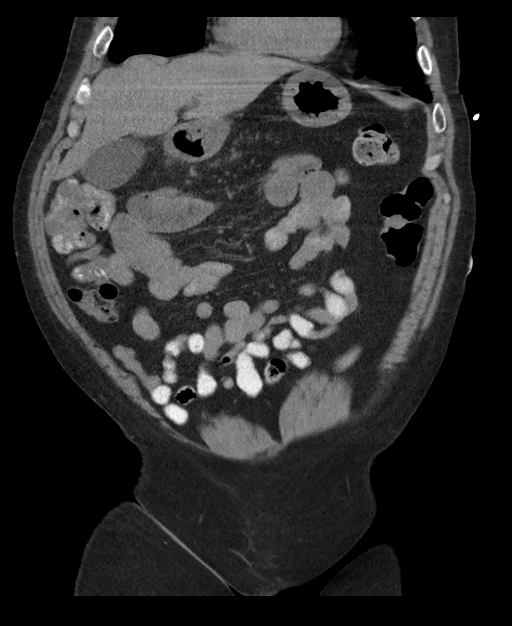
[im 46/104  soft-tissue]
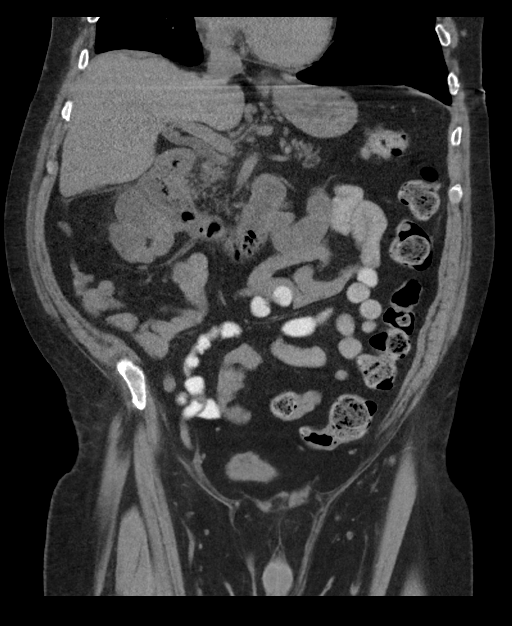
[im 58/104  soft-tissue]
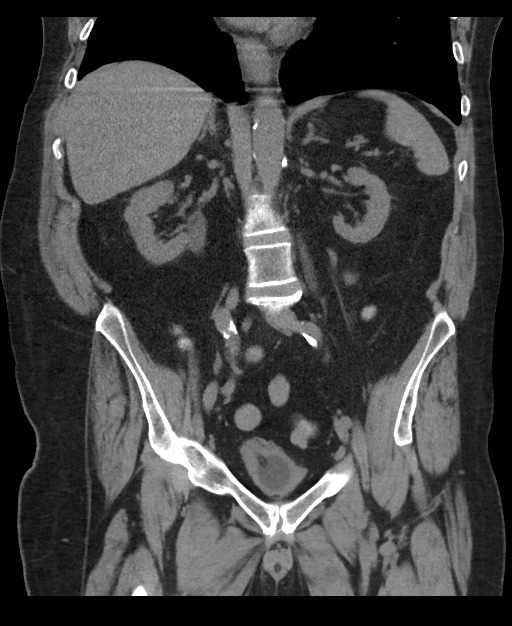

[16 of 46 positions shown; findings below may reference images not displayed]

FINDINGS: Lower chest: Emphysematous changes with minimal atelectasis and
scarring at lung bases RIGHT greater than LEFT.

Hepatobiliary: Dependent density within gallbladder question sludge
versus noncalcified stones. Liver unremarkable. No biliary
dilatation.

Pancreas: Partial fatty replacement without gross mass or
calcification

Spleen: Normal appearance

Adrenals/Urinary Tract: Adrenal glands normal appearance. Mildly
prominent extrarenal pelvis RIGHT kidney. Kidneys in ureters
otherwise normal appearance. Foley catheter decompresses urinary
bladder. Unable to exclude bladder wall thickening in this setting.

Stomach/Bowel: Normal appendix. Small hiatal hernia. Distal antral
and pyloric wall thickening question due to gastritis, mass or
ulcer. Poor definition of the wall of the descending duodenum which
could reflect duodenitis. Stomach and bowel loops otherwise
unremarkable for technique.

Vascular/Lymphatic: Atherosclerotic calcifications. Aorta normal
caliber. No adenopathy.

Reproductive: N/A

Other: No free air or free fluid.  No extraluminal gas or hernia.

Musculoskeletal: Mild scattered degenerative disc and facet disease
changes lumbar spine.
IMPRESSION: Gastric antral/ pyloric wall thickening which could reflect severe
gastritis, ulcer, or mass ; correlation with upper endoscopy
recommended to exclude gastric neoplasm.

Suspected mild duodenitis.

Small hiatal hernia.

Emphysematous changes with minimal atelectasis and scarring at lung
bases.

Decompressed urinary bladder, poorly assessed in this patient with a
history of bladder cancer, unable to exclude wall thickening/mass.

Dependent density in gallbladder question sludge versus noncalcified
stones; this could be better assessed by sonography.

## 2018-04-12 ENCOUNTER — Telehealth: Payer: Self-pay | Admitting: Internal Medicine

## 2018-04-12 NOTE — Telephone Encounter (Signed)
Records from SNF reviewed  He has a heme + anemia and is on Eliquis  Prior records only told me "low Hgb"  So now that I know he has heme + stool; we can set p for an appointment  I ask that set up with an APP for this month to evaluate and decide next steps

## 2018-04-13 NOTE — Telephone Encounter (Signed)
Left a message with Scheduling Coordinator at [facility] to call back and schedule.

## 2018-04-25 ENCOUNTER — Ambulatory Visit: Payer: Self-pay | Admitting: Physician Assistant

## 2018-06-06 ENCOUNTER — Ambulatory Visit: Payer: Self-pay | Admitting: Cardiology

## 2018-11-12 ENCOUNTER — Emergency Department (HOSPITAL_COMMUNITY)
Admission: EM | Admit: 2018-11-12 | Discharge: 2018-11-12 | Disposition: A | Discharge status: Home / Self Care | Payer: Medicare Other | Attending: Emergency Medicine | Admitting: Emergency Medicine

## 2018-11-12 ENCOUNTER — Emergency Department (HOSPITAL_COMMUNITY): Payer: Medicare Other

## 2018-11-12 ENCOUNTER — Other Ambulatory Visit: Payer: Self-pay

## 2018-11-12 ENCOUNTER — Encounter (HOSPITAL_COMMUNITY): Payer: Self-pay

## 2018-11-12 DIAGNOSIS — E039 Hypothyroidism, unspecified: Secondary | ICD-10-CM | POA: Insufficient documentation

## 2018-11-12 DIAGNOSIS — I2510 Atherosclerotic heart disease of native coronary artery without angina pectoris: Secondary | ICD-10-CM | POA: Insufficient documentation

## 2018-11-12 DIAGNOSIS — J449 Chronic obstructive pulmonary disease, unspecified: Secondary | ICD-10-CM | POA: Diagnosis not present

## 2018-11-12 DIAGNOSIS — N5082 Scrotal pain: Secondary | ICD-10-CM | POA: Insufficient documentation

## 2018-11-12 DIAGNOSIS — Z79899 Other long term (current) drug therapy: Secondary | ICD-10-CM | POA: Insufficient documentation

## 2018-11-12 DIAGNOSIS — I252 Old myocardial infarction: Secondary | ICD-10-CM | POA: Diagnosis not present

## 2018-11-12 DIAGNOSIS — Z7901 Long term (current) use of anticoagulants: Secondary | ICD-10-CM | POA: Insufficient documentation

## 2018-11-12 DIAGNOSIS — Z87891 Personal history of nicotine dependence: Secondary | ICD-10-CM | POA: Insufficient documentation

## 2018-11-12 DIAGNOSIS — I10 Essential (primary) hypertension: Secondary | ICD-10-CM | POA: Diagnosis not present

## 2018-11-12 DIAGNOSIS — N50819 Testicular pain, unspecified: Secondary | ICD-10-CM

## 2018-11-12 NOTE — ED Provider Notes (Signed)
[facility] EMERGENCY DEPARTMENT Provider Note   CSN: [id number] Arrival date & time: 11/12/18  1542     History   Chief Complaint Chief Complaint  Patient presents with   Groin Pain    HPI Dale Shields is a 68 y.o. male.     Patient sent over to the emergency department for an ultrasound that was done on Friday that showed possible torsion.  Patient has pain in his scrotum  The history is provided by the patient. No language interpreter was used.  Groin Pain This is a new problem. The current episode started more than 2 days ago. The problem occurs constantly. The problem has been gradually improving. Pertinent negatives include no chest pain, no abdominal pain and no headaches. Nothing aggravates the symptoms. Nothing relieves the symptoms. He has tried nothing for the symptoms.    Past Medical History:  Diagnosis Date   ADHD    per patient Hx from [facility].    Anemia of chronic disease    Anxiety    Anxiety and depression    BPH (benign prostatic hypertrophy)    CAD (coronary artery disease) 11/2011   Stents x 3   Carpal tunnel syndrome    COPD (chronic obstructive pulmonary disease) (HCC)    DDD (degenerative disc disease)    knees, hips, shoulders--Dr. Ronnie Derby [facility] prior to this)   Depression    Dyspnea    Endocarditis 2012   Exertional dyspnea    Fibromyalgia    GERD (gastroesophageal reflux disease)    H/O hiatal hernia    Heart murmur    History of blood transfusion    "gave me too much coumadin and heparin once"   History of epistaxis    History of gout    History of MRSA infection 2012   UTI, bacteremia, and endocarditis   History of renal failure 2011   "on dialysis 3 times over 2 years"   HLD (hyperlipidemia)    Hydronephrosis    Hypertension    Hypothyroidism    Insomnia    Multiple sclerosis ([place])    with optic neuritis   Myocardial infarction ([place])    "according to stress test; that's news to me"     Narcolepsy    Necrotizing fasciitis ([place])    Neurogenic bladder    chronic indwelling foley catheter-changed monthly   Optic neuritis    OSA (obstructive sleep apnea)    "tried CPAP; lost more sleep w/use"   Osteoarthritis of multiple joints    "everywhere"   Peripheral vascular disease ([place])    per medical history from [facility] facility on 10/23/2017   Pulmonary embolism ([place])    "suspected; never confirmed"--? treated with coumadin   Scoliosis    Steroid-induced psychosis    Supraspinatus tendon tear 2012   Thrombocytopenia ([place])     Patient Active Problem List   Diagnosis Date Noted   Neurogenic bladder 07/23/2017   Chronic indwelling Foley catheter 07/23/2017   Chronic respiratory failure with hypoxia and hypercapnia ([place]) 12/02/2016   Morbid obesity due to excess calories ([place]) 12/02/2016   Atrial flutter ([place]) 11/07/2016   SOB (shortness of breath) 03/01/2016   Anemia 03/01/2016   Major depressive disorder, recurrent episode, moderate ([place]) 01/15/2016   Depression 01/11/2016   Hyperlipidemia 01/11/2016   Anxiety 01/11/2016   Chest pain 04/06/2015   Hydronephrosis 11/18/2014   Catheter-associated urinary tract infection ([place])    Malnutrition of moderate degree ([place]) 11/17/2014   AKI (acute  kidney injury) ([place]) 11/16/2014   Hematuria 11/16/2014   Generalized weakness 11/16/2014   FTT (failure to thrive) in adult 11/16/2014   Anemia of chronic disease 11/16/2014   COPD  GOLD IV 11/15/2014   CAD (coronary artery disease) 11/15/2014   Chronic pain syndrome 04/11/2013   Multiple sclerosis ([place]) 04/11/2013    Past Surgical History:  Procedure Laterality Date   CARDIOVERSION N/A 11/08/2016   Procedure: CARDIOVERSION;  Surgeon: Adrian Prows, MD;  Location: [facility];  Service: Cardiovascular;  Laterality: N/A;   CARPAL TUNNEL RELEASE  ~ 2010   left with radius fracture repair   COLONOSCOPY  2000; 2005; 03/15/2011    diminutve adenoma--recall 2017   CORONARY ANGIOPLASTY  11/22/2011   3 vessels   CORONARY ANGIOPLASTY WITH STENT PLACEMENT  11/22/2011   DES to LAD   ESOPHAGOGASTRODUODENOSCOPY N/A 03/04/2016   Procedure: ESOPHAGOGASTRODUODENOSCOPY (EGD);  Surgeon: Manus Gunning, MD;  Location: [facility];  Service: Gastroenterology;  Laterality: N/A;   INSERTION OF SUPRAPUBIC CATHETER N/A 10/24/2017   Procedure: CYSTOSCOPY WITH INSERTION OF SUPRAPUBIC CATHETER;  Surgeon: Ceasar Mons, MD;  Location: [facility];  Service: Urology;  Laterality: N/A;  ONLY NEEDS 60 MIN TOTAL FOR ALL PROCEDURES   KNEE ARTHROSCOPY W/ MENISCAL REPAIR  1990's   right   LEFT HEART CATHETERIZATION WITH CORONARY ANGIOGRAM N/A 11/22/2011   Procedure: LEFT HEART CATHETERIZATION WITH CORONARY ANGIOGRAM;  Surgeon: Laverda Page, MD;  Location: [facility] CATH LAB;  Service: Cardiovascular;  Laterality: N/A;   nasal cautery     OPERATIVE ULTRASOUND N/A 10/24/2017   Procedure: OPERATIVE ULTRASOUND;  Surgeon: Ceasar Mons, MD;  Location: [facility];  Service: Urology;  Laterality: N/A;   PERCUTANEOUS CORONARY STENT INTERVENTION (PCI-S)  11/22/2011   Procedure: PERCUTANEOUS CORONARY STENT INTERVENTION (PCI-S);  Surgeon: Laverda Page, MD;  Location: [facility] CATH LAB;  Service: Cardiovascular;;   SKIN GRAFT  04/2009   right thigh to right "wrist all the way to my shoulder; necrotizing fasciitis        Home Medications    Prior to Admission medications   Medication Sig Start Date End Date Taking? Authorizing Provider  amoxicillin (AMOXIL) 500 MG capsule Take 1,000 mg by mouth 3 (three) times daily. 10/21/17   [provider]  apixaban (ELIQUIS) 5 MG TABS tablet Take 5 mg by mouth 2 (two) times daily.    [provider]  Armodafinil 150 MG tablet Take 150 mg by mouth daily.     [provider]  atorvastatin (LIPITOR) 10 MG tablet Take 10 mg by mouth every evening.     [provider]  budesonide-formoterol (SYMBICORT) 160-4.5 MCG/ACT inhaler Inhale 2 puffs into the lungs 2 (two) times daily. 02/06/17   Tanda Rockers, MD  clonazePAM (KLONOPIN) 0.5 MG tablet Take 0.5 tablets (0.25 mg total) by mouth 2 (two) times daily. 07/26/17   Barton Dubois, MD  diltiazem (CARDIZEM LA) 180 MG 24 hr tablet Take 180 mg by mouth daily.     [provider]  DULoxetine (CYMBALTA) 60 MG capsule TAKE (1) CAPSULE BY MOUTH TWICE DAILY. Patient taking differently: Take 60 mg by mouth 2 (two) times daily.  02/18/16   Norman Clay, MD  ergocalciferol (VITAMIN D2) 50000 units capsule Take 50,000 Units by mouth every Monday.     [provider]  ferrous sulfate 325 (65 FE) MG EC tablet Take 325 mg by mouth daily with breakfast.    [provider]  HYDROcodone-acetaminophen ([place]) 5-325  MG tablet Take 1 tablet by mouth every 4 (four) hours as needed for moderate pain. 10/24/17   Ceasar Mons, MD  ketoconazole (NIZORAL) 2 % shampoo Apply 1 application topically See admin instructions. Apply to scalp topically every evening shift Monday and Thursday.    [provider]  levalbuterol Penne Lash) 1.25 MG/3ML nebulizer solution Take 1.25 mg by nebulization every 6 (six) hours as needed for wheezing or shortness of breath.    [provider]  loperamide (IMODIUM A-D) 2 MG tablet Take 2-4 mg by mouth as needed for diarrhea or loose stools (Do not exceed 4 caplets in 24 hour period).    [provider]  magnesium hydroxide (MILK OF MAGNESIA) 400 MG/5ML suspension Take 30 mLs by mouth daily as needed for mild constipation.    [provider]  methadone (DOLOPHINE) 10 MG tablet Take 2 tablets (20 mg total) by mouth every 12 (twelve) hours. Patient taking differently: Take 30 mg by mouth 3 (three) times daily.  07/26/17   Barton Dubois, MD  metoprolol tartrate (LOPRESSOR) 25 MG tablet Take 25 mg by mouth 2 (two) times daily.      [provider]  nitroGLYCERIN (NITROSTAT) 0.3 MG SL tablet Place 0.4 mg under the tongue every 5 (five) minutes as needed for chest pain.    [provider]  Omeprazole 20 MG TBDD Take 40 mg by mouth 2 (two) times daily.     [provider]  oxybutynin (DITROPAN) 5 MG tablet Take 1 tablet (5 mg total) by mouth every 8 (eight) hours as needed for bladder spasms. 10/24/17   Ceasar Mons, MD  traZODone (DESYREL) 100 MG tablet Take 100 mg by mouth at bedtime.  02/15/16   [provider]  umeclidinium bromide (INCRUSE ELLIPTA) 62.5 MCG/INH AEPB Inhale 1 puff into the lungs daily.    [provider]    Family History Family History  Problem Relation Age of Onset   Alzheimer's disease Mother    Colon cancer Neg Hx     Social History Social History   Tobacco Use   Smoking status: Former Smoker    Packs/day: 1.00    Years: 40.00    Pack years: 40.00    Types: Cigarettes    Quit date: 11/27/2016    Years since quitting: 1.9   Smokeless tobacco: Never Used  Substance Use Topics   Alcohol use: No    Comment: 11/22/2011 "like crazy til 1988; nothing since"   Drug use: No    Types: Cocaine    Comment: *8//13/13 "[place] like crazy; nothing since"     Allergies   Ultram [tramadol hcl], Gabapentin, Lyrica [pregabalin], and Versed [midazolam]   Review of Systems Review of Systems  Constitutional: Negative for appetite change and fatigue.  HENT: Negative for congestion, ear discharge and sinus pressure.   Eyes: Negative for discharge.  Respiratory: Negative for cough.   Cardiovascular: Negative for chest pain.  Gastrointestinal: Negative for abdominal pain and diarrhea.  Genitourinary: Negative for frequency and hematuria.       Scrotal pain  Musculoskeletal: Negative for back pain.  Skin: Negative for rash.  Neurological: Negative for seizures and headaches.  Psychiatric/Behavioral: Negative for hallucinations.      Physical Exam Updated Vital Signs BP 109/62    Pulse 71    Temp 98 F (36.7 C) (Oral)    Resp 18    Ht 5\' 8"  (1.727 m)    Wt 115.2 kg  SpO2 99%    BMI 38.62 kg/m   Physical Exam Vitals signs and nursing note reviewed.  Constitutional:      Appearance: He is well-developed.  HENT:     Head: Normocephalic.     Nose: Nose normal.  Eyes:     General: No scleral icterus.    Conjunctiva/sclera: Conjunctivae normal.  Neck:     Musculoskeletal: Neck supple.     Thyroid: No thyromegaly.  Cardiovascular:     Rate and Rhythm: Normal rate and regular rhythm.     Heart sounds: No murmur. No friction rub. No gallop.   Pulmonary:     Breath sounds: No stridor. No wheezing or rales.  Chest:     Chest wall: No tenderness.  Abdominal:     General: There is no distension.     Tenderness: There is no abdominal tenderness. There is no rebound.  Genitourinary:    Comments: Minimal tenderness to the entire scrotum Musculoskeletal: Normal range of motion.  Lymphadenopathy:     Cervical: No cervical adenopathy.  Skin:    Findings: No erythema or rash.  Neurological:     Mental Status: He is oriented to person, place, and time.     Motor: No abnormal muscle tone.     Coordination: Coordination normal.  Psychiatric:        Behavior: Behavior normal.      ED Treatments / Results  Labs (all labs ordered are listed, but only abnormal results are displayed) Labs Reviewed - No data to display  EKG None  Radiology US Scrotum W/doppler  Result Date: 11/12/2018 CLINICAL DATA:  Testicular pain for 4 days EXAM: SCROTAL ULTRASOUND DOPPLER ULTRASOUND OF THE TESTICLES TECHNIQUE: Complete ultrasound examination of the testicles, epididymis, and other scrotal structures was performed. Color and spectral Doppler ultrasound were also utilized to evaluate blood flow to the testicles. COMPARISON:  None. FINDINGS: Right testicle Measurements: 3.4 x 1.6 x 2.1 cm. Heterogeneous echotexture without  discrete mass. Scattered parenchymal calcification. Left testicle Measurements: 4.1 x 1.5 x 2.3 cm. Heterogeneous echotexture without discrete mass. Right epididymis:  Normal in size and appearance. Left epididymis:  Normal in size and appearance. Hydrocele:  None visualized. Varicocele:  None visualized. Pulsed Doppler interrogation of both testes demonstrates normal low resistance arterial and venous waveforms bilaterally. IMPRESSION: 1. Negative for testicular torsion or mass lesion 2. Few parenchymal calcifications in the right testis. Current literature suggests that testicular microlithiasis is not a significant independent risk factor for development of testicular carcinoma, and that follow up imaging is not warranted in the absence of other risk factors. Monthly testicular self-examination and annual physical exams are considered appropriate surveillance. If patient has other risk factors for testicular carcinoma, then referral to Urology should be considered. (Reference: DeCastro, et al.: A 5-Year Follow up Study of Asymptomatic Men with Testicular Microlithiasis. J Urol 2008; 161:0960-4540.) Electronically Signed   By: Donavan Foil M.D.   On: 11/12/2018 17:06    Procedures Procedures (including critical care time)  Medications Ordered in ED Medications - No data to display   Initial Impression / Assessment and Plan / ED Course  I have reviewed the triage vital signs and the nursing notes.  Pertinent labs & imaging results that were available during my care of the patient were reviewed by me and considered in my medical decision making (see chart for details).        Ultrasound does not show testicular torsion.  He will follow-up with urology for scrotal discomfort.  Final Clinical Impressions(s) / ED Diagnoses   Final diagnoses:  Scrotal pain    ED Discharge Orders    None       Milton Ferguson, MD 11/12/18 1747

## 2018-11-12 NOTE — Discharge Instructions (Signed)
Follow-up with [facility] for scrotal pain

## 2018-11-12 NOTE — ED Triage Notes (Signed)
Pt presents to ED, brought by [facility] for complaints of testicle pain since Friday. Pt stays at [facility] and states the pain started approx 3 weeks ago. Pt had [name] at [facility] Saturday which showed findings suspicious for torsion versus arterial insufficiency. [name] report at bedside.

## 2021-05-12 DEATH — deceased
